# Patient Record
Sex: Female | Born: 1935 | Race: White | Hispanic: No | Smoking: Former smoker
Health system: Southern US, Community
[De-identification: ages and names within clinical notes are randomized; demographics above are authoritative.]

## PROBLEM LIST (undated history)

## (undated) DIAGNOSIS — H269 Unspecified cataract: Secondary | ICD-10-CM

## (undated) DIAGNOSIS — J432 Centrilobular emphysema: Secondary | ICD-10-CM

## (undated) DIAGNOSIS — K52831 Collagenous colitis: Secondary | ICD-10-CM

## (undated) DIAGNOSIS — I739 Peripheral vascular disease, unspecified: Secondary | ICD-10-CM

## (undated) DIAGNOSIS — F419 Anxiety disorder, unspecified: Secondary | ICD-10-CM

## (undated) DIAGNOSIS — I519 Heart disease, unspecified: Secondary | ICD-10-CM

## (undated) DIAGNOSIS — K449 Diaphragmatic hernia without obstruction or gangrene: Secondary | ICD-10-CM

## (undated) DIAGNOSIS — K5090 Crohn's disease, unspecified, without complications: Secondary | ICD-10-CM

## (undated) DIAGNOSIS — I480 Paroxysmal atrial fibrillation: Secondary | ICD-10-CM

## (undated) DIAGNOSIS — J189 Pneumonia, unspecified organism: Secondary | ICD-10-CM

## (undated) DIAGNOSIS — Z5189 Encounter for other specified aftercare: Secondary | ICD-10-CM

## (undated) DIAGNOSIS — F329 Major depressive disorder, single episode, unspecified: Secondary | ICD-10-CM

## (undated) DIAGNOSIS — M810 Age-related osteoporosis without current pathological fracture: Secondary | ICD-10-CM

## (undated) DIAGNOSIS — I272 Pulmonary hypertension, unspecified: Secondary | ICD-10-CM

## (undated) DIAGNOSIS — D649 Anemia, unspecified: Secondary | ICD-10-CM

## (undated) DIAGNOSIS — F32A Depression, unspecified: Secondary | ICD-10-CM

## (undated) DIAGNOSIS — R011 Cardiac murmur, unspecified: Secondary | ICD-10-CM

## (undated) DIAGNOSIS — M1990 Unspecified osteoarthritis, unspecified site: Secondary | ICD-10-CM

## (undated) DIAGNOSIS — E785 Hyperlipidemia, unspecified: Secondary | ICD-10-CM

## (undated) DIAGNOSIS — K315 Obstruction of duodenum: Secondary | ICD-10-CM

## (undated) DIAGNOSIS — K219 Gastro-esophageal reflux disease without esophagitis: Secondary | ICD-10-CM

## (undated) DIAGNOSIS — K269 Duodenal ulcer, unspecified as acute or chronic, without hemorrhage or perforation: Secondary | ICD-10-CM

## (undated) DIAGNOSIS — I10 Essential (primary) hypertension: Secondary | ICD-10-CM

## (undated) HISTORY — DX: Unspecified cataract: H26.9

## (undated) HISTORY — DX: Pneumonia, unspecified organism: J18.9

## (undated) HISTORY — DX: Anxiety disorder, unspecified: F41.9

## (undated) HISTORY — DX: Peripheral vascular disease, unspecified: I73.9

## (undated) HISTORY — DX: Hyperlipidemia, unspecified: E78.5

## (undated) HISTORY — PX: HIP SURGERY: SHX245

## (undated) HISTORY — PX: TUBAL LIGATION: SHX77

## (undated) HISTORY — DX: Essential (primary) hypertension: I10

## (undated) HISTORY — DX: Collagenous colitis: K52.831

## (undated) HISTORY — DX: Diaphragmatic hernia without obstruction or gangrene: K44.9

## (undated) HISTORY — DX: Major depressive disorder, single episode, unspecified: F32.9

## (undated) HISTORY — PX: CERVICAL DISC SURGERY: SHX588

## (undated) HISTORY — PX: COLONOSCOPY: SHX174

## (undated) HISTORY — DX: Unspecified osteoarthritis, unspecified site: M19.90

## (undated) HISTORY — DX: Obstruction of duodenum: K31.5

## (undated) HISTORY — DX: Crohn's disease, unspecified, without complications: K50.90

## (undated) HISTORY — DX: Encounter for other specified aftercare: Z51.89

## (undated) HISTORY — DX: Gastro-esophageal reflux disease without esophagitis: K21.9

## (undated) HISTORY — PX: CORONARY ARTERY BYPASS GRAFT: SHX141

## (undated) HISTORY — DX: Heart disease, unspecified: I51.9

## (undated) HISTORY — DX: Cardiac murmur, unspecified: R01.1

## (undated) HISTORY — DX: Age-related osteoporosis without current pathological fracture: M81.0

## (undated) HISTORY — DX: Anemia, unspecified: D64.9

## (undated) HISTORY — DX: Duodenal ulcer, unspecified as acute or chronic, without hemorrhage or perforation: K26.9

## (undated) HISTORY — PX: ABDOMINAL HYSTERECTOMY: SHX81

## (undated) HISTORY — DX: Depression, unspecified: F32.A

---

## 1975-06-19 HISTORY — PX: BUNIONECTOMY: SHX129

## 1996-06-18 HISTORY — PX: FRACTURE SURGERY: SHX138

## 1997-04-26 ENCOUNTER — Encounter: Payer: Self-pay | Admitting: Internal Medicine

## 1997-12-29 ENCOUNTER — Ambulatory Visit (HOSPITAL_COMMUNITY): Admission: RE | Admit: 1997-12-29 | Discharge: 1997-12-29 | Payer: Self-pay | Admitting: Gastroenterology

## 1998-05-18 HISTORY — PX: CAROTID ENDARTERECTOMY: SUR193

## 1998-05-26 ENCOUNTER — Encounter: Payer: Self-pay | Admitting: *Deleted

## 1998-05-26 ENCOUNTER — Ambulatory Visit (HOSPITAL_COMMUNITY): Admission: RE | Admit: 1998-05-26 | Discharge: 1998-05-26 | Payer: Self-pay | Admitting: *Deleted

## 1998-05-27 ENCOUNTER — Encounter: Payer: Self-pay | Admitting: *Deleted

## 1998-05-30 ENCOUNTER — Inpatient Hospital Stay: Admission: RE | Admit: 1998-05-30 | Discharge: 1998-05-31 | Payer: Self-pay | Admitting: *Deleted

## 1998-07-07 ENCOUNTER — Ambulatory Visit (HOSPITAL_COMMUNITY): Admission: RE | Admit: 1998-07-07 | Discharge: 1998-07-07 | Payer: Self-pay | Admitting: Family Medicine

## 1998-07-07 ENCOUNTER — Encounter: Payer: Self-pay | Admitting: Family Medicine

## 1998-11-01 ENCOUNTER — Other Ambulatory Visit: Admission: RE | Admit: 1998-11-01 | Discharge: 1998-11-01 | Payer: Self-pay | Admitting: Family Medicine

## 1999-07-04 ENCOUNTER — Encounter: Payer: Self-pay | Admitting: Family Medicine

## 1999-07-04 ENCOUNTER — Encounter: Admission: RE | Admit: 1999-07-04 | Discharge: 1999-07-04 | Payer: Self-pay | Admitting: Family Medicine

## 1999-12-05 ENCOUNTER — Other Ambulatory Visit: Admission: RE | Admit: 1999-12-05 | Discharge: 1999-12-05 | Payer: Self-pay | Admitting: Family Medicine

## 1999-12-25 ENCOUNTER — Encounter: Admission: RE | Admit: 1999-12-25 | Discharge: 1999-12-25 | Payer: Self-pay | Admitting: Family Medicine

## 1999-12-25 ENCOUNTER — Encounter: Payer: Self-pay | Admitting: Family Medicine

## 2000-07-05 ENCOUNTER — Encounter: Admission: RE | Admit: 2000-07-05 | Discharge: 2000-07-05 | Payer: Self-pay | Admitting: Family Medicine

## 2000-07-05 ENCOUNTER — Encounter: Payer: Self-pay | Admitting: Family Medicine

## 2000-09-25 ENCOUNTER — Ambulatory Visit (HOSPITAL_COMMUNITY): Admission: RE | Admit: 2000-09-25 | Discharge: 2000-09-25 | Payer: Self-pay | Admitting: Gastroenterology

## 2000-11-22 ENCOUNTER — Encounter: Payer: Self-pay | Admitting: Family Medicine

## 2000-11-22 ENCOUNTER — Encounter: Admission: RE | Admit: 2000-11-22 | Discharge: 2000-11-22 | Payer: Self-pay | Admitting: Family Medicine

## 2000-12-13 ENCOUNTER — Other Ambulatory Visit: Admission: RE | Admit: 2000-12-13 | Discharge: 2000-12-13 | Payer: Self-pay | Admitting: Family Medicine

## 2001-06-30 ENCOUNTER — Encounter: Payer: Self-pay | Admitting: Family Medicine

## 2001-06-30 ENCOUNTER — Encounter: Admission: RE | Admit: 2001-06-30 | Discharge: 2001-06-30 | Payer: Self-pay | Admitting: Family Medicine

## 2001-08-08 ENCOUNTER — Encounter: Payer: Self-pay | Admitting: Neurosurgery

## 2001-08-12 ENCOUNTER — Inpatient Hospital Stay (HOSPITAL_COMMUNITY): Admission: RE | Admit: 2001-08-12 | Discharge: 2001-08-13 | Payer: Self-pay | Admitting: Neurosurgery

## 2001-08-12 ENCOUNTER — Encounter: Payer: Self-pay | Admitting: Neurosurgery

## 2001-12-18 ENCOUNTER — Other Ambulatory Visit: Admission: RE | Admit: 2001-12-18 | Discharge: 2001-12-18 | Payer: Self-pay | Admitting: Family Medicine

## 2002-01-02 ENCOUNTER — Encounter: Payer: Self-pay | Admitting: Family Medicine

## 2002-01-02 ENCOUNTER — Encounter: Admission: RE | Admit: 2002-01-02 | Discharge: 2002-01-02 | Payer: Self-pay | Admitting: Family Medicine

## 2002-07-28 ENCOUNTER — Encounter: Admission: RE | Admit: 2002-07-28 | Discharge: 2002-07-28 | Payer: Self-pay | Admitting: Family Medicine

## 2002-07-28 ENCOUNTER — Encounter: Payer: Self-pay | Admitting: Family Medicine

## 2002-08-04 ENCOUNTER — Encounter: Admission: RE | Admit: 2002-08-04 | Discharge: 2002-08-04 | Payer: Self-pay | Admitting: Specialist

## 2002-08-04 ENCOUNTER — Encounter: Payer: Self-pay | Admitting: Specialist

## 2002-12-22 ENCOUNTER — Other Ambulatory Visit: Admission: RE | Admit: 2002-12-22 | Discharge: 2002-12-22 | Payer: Self-pay | Admitting: Family Medicine

## 2003-04-06 ENCOUNTER — Ambulatory Visit (HOSPITAL_COMMUNITY): Admission: RE | Admit: 2003-04-06 | Discharge: 2003-04-06 | Payer: Self-pay | Admitting: Gastroenterology

## 2003-05-18 ENCOUNTER — Ambulatory Visit (HOSPITAL_COMMUNITY): Admission: RE | Admit: 2003-05-18 | Discharge: 2003-05-18 | Payer: Self-pay | Admitting: *Deleted

## 2003-06-08 ENCOUNTER — Inpatient Hospital Stay (HOSPITAL_BASED_OUTPATIENT_CLINIC_OR_DEPARTMENT_OTHER): Admission: RE | Admit: 2003-06-08 | Discharge: 2003-06-08 | Payer: Self-pay | Admitting: Cardiovascular Disease

## 2003-06-19 HISTORY — PX: OTHER SURGICAL HISTORY: SHX169

## 2003-07-15 ENCOUNTER — Inpatient Hospital Stay (HOSPITAL_COMMUNITY): Admission: RE | Admit: 2003-07-15 | Discharge: 2003-07-19 | Payer: Self-pay | Admitting: Surgery

## 2003-08-30 ENCOUNTER — Encounter (HOSPITAL_COMMUNITY): Admission: RE | Admit: 2003-08-30 | Discharge: 2003-11-28 | Payer: Self-pay | Admitting: Cardiology

## 2003-11-29 ENCOUNTER — Encounter (HOSPITAL_COMMUNITY): Admission: RE | Admit: 2003-11-29 | Discharge: 2004-02-27 | Payer: Self-pay | Admitting: Cardiology

## 2003-12-20 ENCOUNTER — Emergency Department (HOSPITAL_COMMUNITY): Admission: EM | Admit: 2003-12-20 | Discharge: 2003-12-20 | Payer: Self-pay | Admitting: Emergency Medicine

## 2004-03-08 ENCOUNTER — Ambulatory Visit (HOSPITAL_COMMUNITY): Admission: RE | Admit: 2004-03-08 | Discharge: 2004-03-08 | Payer: Self-pay | Admitting: Family Medicine

## 2004-03-13 ENCOUNTER — Encounter: Admission: RE | Admit: 2004-03-13 | Discharge: 2004-03-29 | Payer: Self-pay | Admitting: Family Medicine

## 2005-01-10 ENCOUNTER — Ambulatory Visit: Payer: Self-pay | Admitting: Gastroenterology

## 2005-03-05 ENCOUNTER — Ambulatory Visit: Payer: Self-pay | Admitting: Cardiology

## 2005-07-13 ENCOUNTER — Ambulatory Visit (HOSPITAL_COMMUNITY): Admission: RE | Admit: 2005-07-13 | Discharge: 2005-07-13 | Payer: Self-pay | Admitting: Family Medicine

## 2006-11-28 ENCOUNTER — Ambulatory Visit: Payer: Self-pay | Admitting: Gastroenterology

## 2006-11-28 LAB — CONVERTED CEMR LAB
AST: 21 units/L (ref 0–37)
Alkaline Phosphatase: 84 units/L (ref 39–117)
BUN: 16 mg/dL (ref 6–23)
Basophils Absolute: 0 10*3/uL (ref 0.0–0.1)
Basophils Relative: 0.7 % (ref 0.0–1.0)
Chloride: 108 meq/L (ref 96–112)
Creatinine, Ser: 0.9 mg/dL (ref 0.4–1.2)
Eosinophils Absolute: 0.3 10*3/uL (ref 0.0–0.6)
Folate: 11.7 ng/mL
Hemoglobin: 11.9 g/dL — ABNORMAL LOW (ref 12.0–15.0)
Iron: 104 ug/dL (ref 42–145)
MCHC: 35.2 g/dL (ref 30.0–36.0)
Neutro Abs: 4.7 10*3/uL (ref 1.4–7.7)
Neutrophils Relative %: 67.2 % (ref 43.0–77.0)
Platelets: 272 10*3/uL (ref 150–400)
Potassium: 4.6 meq/L (ref 3.5–5.1)
Saturation Ratios: 28.8 % (ref 20.0–50.0)
Sodium: 143 meq/L (ref 135–145)
TSH: 0.86 microintl units/mL (ref 0.35–5.50)
WBC: 6.9 10*3/uL (ref 4.5–10.5)

## 2006-12-02 ENCOUNTER — Ambulatory Visit: Payer: Self-pay | Admitting: Gastroenterology

## 2007-05-17 ENCOUNTER — Encounter: Payer: Self-pay | Admitting: Internal Medicine

## 2007-05-17 DIAGNOSIS — J189 Pneumonia, unspecified organism: Secondary | ICD-10-CM

## 2007-06-25 ENCOUNTER — Ambulatory Visit (HOSPITAL_COMMUNITY): Admission: RE | Admit: 2007-06-25 | Discharge: 2007-06-25 | Payer: Self-pay | Admitting: Family Medicine

## 2007-07-18 ENCOUNTER — Ambulatory Visit: Payer: Self-pay | Admitting: Internal Medicine

## 2007-07-18 DIAGNOSIS — E785 Hyperlipidemia, unspecified: Secondary | ICD-10-CM

## 2007-07-18 DIAGNOSIS — K219 Gastro-esophageal reflux disease without esophagitis: Secondary | ICD-10-CM

## 2007-07-18 DIAGNOSIS — I2510 Atherosclerotic heart disease of native coronary artery without angina pectoris: Secondary | ICD-10-CM

## 2007-07-18 DIAGNOSIS — J984 Other disorders of lung: Secondary | ICD-10-CM | POA: Insufficient documentation

## 2007-07-18 DIAGNOSIS — I499 Cardiac arrhythmia, unspecified: Secondary | ICD-10-CM | POA: Insufficient documentation

## 2007-07-18 DIAGNOSIS — J40 Bronchitis, not specified as acute or chronic: Secondary | ICD-10-CM | POA: Insufficient documentation

## 2007-07-18 DIAGNOSIS — I10 Essential (primary) hypertension: Secondary | ICD-10-CM

## 2007-07-18 DIAGNOSIS — K5090 Crohn's disease, unspecified, without complications: Secondary | ICD-10-CM | POA: Insufficient documentation

## 2007-07-18 DIAGNOSIS — Z9889 Other specified postprocedural states: Secondary | ICD-10-CM

## 2007-08-08 ENCOUNTER — Ambulatory Visit: Payer: Self-pay | Admitting: Internal Medicine

## 2007-10-03 ENCOUNTER — Inpatient Hospital Stay (HOSPITAL_COMMUNITY): Admission: EM | Admit: 2007-10-03 | Discharge: 2007-10-05 | Payer: Self-pay | Admitting: Emergency Medicine

## 2007-10-31 DIAGNOSIS — K315 Obstruction of duodenum: Secondary | ICD-10-CM | POA: Insufficient documentation

## 2007-10-31 DIAGNOSIS — K449 Diaphragmatic hernia without obstruction or gangrene: Secondary | ICD-10-CM | POA: Insufficient documentation

## 2007-10-31 DIAGNOSIS — I739 Peripheral vascular disease, unspecified: Secondary | ICD-10-CM | POA: Insufficient documentation

## 2007-12-23 ENCOUNTER — Encounter: Admission: RE | Admit: 2007-12-23 | Discharge: 2007-12-23 | Payer: Self-pay | Admitting: Family Medicine

## 2008-10-06 ENCOUNTER — Ambulatory Visit (HOSPITAL_COMMUNITY): Admission: RE | Admit: 2008-10-06 | Discharge: 2008-10-06 | Payer: Self-pay | Admitting: Family Medicine

## 2008-10-10 ENCOUNTER — Inpatient Hospital Stay (HOSPITAL_COMMUNITY): Admission: EM | Admit: 2008-10-10 | Discharge: 2008-10-13 | Payer: Self-pay | Admitting: Emergency Medicine

## 2008-10-12 ENCOUNTER — Ambulatory Visit: Payer: Self-pay | Admitting: Vascular Surgery

## 2008-10-12 ENCOUNTER — Encounter (INDEPENDENT_AMBULATORY_CARE_PROVIDER_SITE_OTHER): Payer: Self-pay | Admitting: Orthopedic Surgery

## 2008-12-02 ENCOUNTER — Ambulatory Visit: Payer: Self-pay | Admitting: *Deleted

## 2009-06-03 ENCOUNTER — Ambulatory Visit: Payer: Self-pay | Admitting: Vascular Surgery

## 2009-06-28 ENCOUNTER — Ambulatory Visit: Payer: Self-pay | Admitting: Gastroenterology

## 2009-06-28 DIAGNOSIS — R197 Diarrhea, unspecified: Secondary | ICD-10-CM | POA: Insufficient documentation

## 2009-06-28 LAB — CONVERTED CEMR LAB
ALT: 17 units/L (ref 0–35)
Basophils Absolute: 0 10*3/uL (ref 0.0–0.1)
Basophils Relative: 0.5 % (ref 0.0–3.0)
Bilirubin, Direct: 0.1 mg/dL (ref 0.0–0.3)
CO2: 28 meq/L (ref 19–32)
Calcium: 9.4 mg/dL (ref 8.4–10.5)
Chloride: 100 meq/L (ref 96–112)
Ferritin: 86.4 ng/mL (ref 10.0–291.0)
GFR calc non Af Amer: 51.89 mL/min (ref >60)
Glucose, Bld: 74 mg/dL (ref 70–99)
HCT: 35.8 % — ABNORMAL LOW (ref 36.0–46.0)
Hemoglobin: 12 g/dL (ref 12.0–15.0)
Lymphocytes Relative: 19.8 % (ref 12.0–46.0)
Lymphs Abs: 1.5 10*3/uL (ref 0.7–4.0)
Monocytes Relative: 4.9 % (ref 3.0–12.0)
Neutrophils Relative %: 70.7 % (ref 43.0–77.0)
Platelets: 236 10*3/uL (ref 150.0–400.0)
Potassium: 3.8 meq/L (ref 3.5–5.1)
RBC: 3.5 M/uL — ABNORMAL LOW (ref 3.87–5.11)
Saturation Ratios: 25.6 % (ref 20.0–50.0)
Total Bilirubin: 0.5 mg/dL (ref 0.3–1.2)
Total Protein: 6.3 g/dL (ref 6.0–8.3)
Vitamin B-12: 447 pg/mL (ref 211–911)

## 2009-06-29 ENCOUNTER — Ambulatory Visit: Payer: Self-pay | Admitting: Gastroenterology

## 2009-06-29 DIAGNOSIS — K2970 Gastritis, unspecified, without bleeding: Secondary | ICD-10-CM | POA: Insufficient documentation

## 2009-06-29 DIAGNOSIS — K2990 Gastroduodenitis, unspecified, without bleeding: Secondary | ICD-10-CM

## 2009-06-29 LAB — CONVERTED CEMR LAB: UREASE: NEGATIVE

## 2009-07-04 ENCOUNTER — Encounter: Payer: Self-pay | Admitting: Gastroenterology

## 2009-07-05 ENCOUNTER — Telehealth: Payer: Self-pay | Admitting: Gastroenterology

## 2009-07-06 ENCOUNTER — Telehealth: Payer: Self-pay | Admitting: Gastroenterology

## 2009-07-07 ENCOUNTER — Telehealth: Payer: Self-pay | Admitting: Gastroenterology

## 2009-08-09 ENCOUNTER — Ambulatory Visit: Payer: Self-pay | Admitting: Gastroenterology

## 2009-08-09 DIAGNOSIS — K5180 Other ulcerative colitis without complications: Secondary | ICD-10-CM

## 2009-08-16 ENCOUNTER — Emergency Department (HOSPITAL_COMMUNITY): Admission: EM | Admit: 2009-08-16 | Discharge: 2009-08-16 | Payer: Self-pay | Admitting: Emergency Medicine

## 2010-02-13 ENCOUNTER — Ambulatory Visit (HOSPITAL_COMMUNITY): Admission: RE | Admit: 2010-02-13 | Discharge: 2010-02-13 | Payer: Self-pay | Admitting: Family Medicine

## 2010-06-18 ENCOUNTER — Inpatient Hospital Stay (HOSPITAL_COMMUNITY)
Admission: EM | Admit: 2010-06-18 | Discharge: 2010-06-22 | Payer: Self-pay | Source: Home / Self Care | Attending: Internal Medicine | Admitting: Internal Medicine

## 2010-06-21 LAB — CBC
HCT: 30.9 % — ABNORMAL LOW (ref 36.0–46.0)
Hemoglobin: 10.1 g/dL — ABNORMAL LOW (ref 12.0–15.0)
MCH: 32.3 pg (ref 26.0–34.0)
MCHC: 32.7 g/dL (ref 30.0–36.0)
MCV: 98.7 fL (ref 78.0–100.0)
Platelets: 268 10*3/uL (ref 150–400)
RBC: 3.13 MIL/uL — ABNORMAL LOW (ref 3.87–5.11)
RDW: 12.9 % (ref 11.5–15.5)
WBC: 14.4 10*3/uL — ABNORMAL HIGH (ref 4.0–10.5)

## 2010-06-22 LAB — DIFFERENTIAL
Basophils Absolute: 0 10*3/uL (ref 0.0–0.1)
Basophils Relative: 0 % (ref 0–1)
Eosinophils Absolute: 0.3 10*3/uL (ref 0.0–0.7)
Eosinophils Relative: 2 % (ref 0–5)
Lymphocytes Relative: 11 % — ABNORMAL LOW (ref 12–46)
Lymphs Abs: 1.5 10*3/uL (ref 0.7–4.0)
Monocytes Absolute: 0.8 10*3/uL (ref 0.1–1.0)
Monocytes Relative: 6 % (ref 3–12)
Neutro Abs: 11.8 10*3/uL — ABNORMAL HIGH (ref 1.7–7.7)
Neutrophils Relative %: 82 % — ABNORMAL HIGH (ref 43–77)

## 2010-06-22 LAB — CBC
HCT: 29.9 % — ABNORMAL LOW (ref 36.0–46.0)
Hemoglobin: 9.7 g/dL — ABNORMAL LOW (ref 12.0–15.0)
MCH: 31.8 pg (ref 26.0–34.0)
MCHC: 32.4 g/dL (ref 30.0–36.0)
MCV: 98 fL (ref 78.0–100.0)
Platelets: 276 10*3/uL (ref 150–400)
RBC: 3.05 MIL/uL — ABNORMAL LOW (ref 3.87–5.11)
RDW: 12.9 % (ref 11.5–15.5)
WBC: 10.6 10*3/uL — ABNORMAL HIGH (ref 4.0–10.5)

## 2010-07-09 ENCOUNTER — Encounter: Payer: Self-pay | Admitting: Family Medicine

## 2010-07-18 NOTE — Miscellaneous (Signed)
Summary: entocort order  Clinical Lists Changes  Medications: Added new medication of ENTOCORT EC 3 MG XR24H-CAP (BUDESONIDE) Take 3 capsules p.o. every morning - Signed Rx of ENTOCORT EC 3 MG XR24H-CAP (BUDESONIDE) Take 3 capsules p.o. every morning;  #90 x 11;  Signed;  Entered by: Abel Presto RN;  Authorized by: Sable Feil MD [facility];  Method used: Electronically to [facility] [id number] [place], [place], [place], [place]  [address], Ph: [phone], Fax: [phone]    Prescriptions: ENTOCORT EC 3 MG XR24H-CAP (BUDESONIDE) Take 3 capsules p.o. every morning  #90 x 11   Entered by:   Abel Presto RN   Authorized by:   Sable Feil MD [facility]   Signed by:   Abel Presto RN on 07/04/2009   Method used:   Electronically to        [facility] [phone]* (retail)       [address] [place], [place]  [address]       Ph: [phone]       Fax: [phone]   RxID:   [id number]

## 2010-07-18 NOTE — Progress Notes (Signed)
Summary: Entocort too expensive   Phone Note Call from Patient Call back at Home Phone [phone]   Call For: DR PATTERSON Reason for Call: Talk to Nurse Summary of Call: Entocort is going to cost her 1000-Can she get something less costly. Initial call taken by: Irwin Brakeman [facility],  July 05, 2009 11:37 AM  Follow-up for Phone Call        she really needs to get this medication and have him on after using it for one or 2 months. There really is no good alternative successful treatment here Follow-up by: Sable Feil MD [facility],  July 05, 2009 12:30 PM  Additional Follow-up for Phone Call Additional follow up Details #1::        Pt notified.  Pt req Rx be sent to [facility] on [address].  Return appt scheduled. Additional Follow-up by: Alberteen Spindle RN,  July 06, 2009 9:46 AM    Prescriptions: ENTOCORT EC 3 MG XR24H-CAP (BUDESONIDE) Take 3 capsules p.o. every morning  #90 x 3   Entered by:   Alberteen Spindle RN   Authorized by:   Sable Feil MD [facility]   Signed by:   Alberteen Spindle RN on 07/06/2009   Method used:   Electronically to        [facility] [address] [phone]* (retail)       [address] [place], [place]  [address]       Ph: [phone]       Fax: [phone]   RxID:   [id number]

## 2010-07-18 NOTE — Progress Notes (Signed)
Summary: change pharmacies   Phone Note Call from Patient Call back at Home Phone [phone]   Caller: Patient Call For: Dr. Sharlett Iles Reason for Call: Talk to Nurse Summary of Call: pt says her Entocort was called into wrong pharmacy... correct pharmacy is [facility] on [address] at [facility] Initial call taken by: Lucien Mons,  July 06, 2009 1:53 PM  Follow-up for Phone Call        Rx resent. Follow-up by: Alberteen Spindle RN,  July 06, 2009 2:21 PM    Prescriptions: ENTOCORT EC 3 MG XR24H-CAP (BUDESONIDE) Take 3 capsules p.o. every morning  #90 x 3   Entered by:   Alberteen Spindle RN   Authorized by:   Sable Feil MD [facility]   Signed by:   Alberteen Spindle RN on 07/06/2009   Method used:   Electronically to        [facility] (retail)       [phone]       [place], [place]  [address]       Ph: [phone]       Fax: [phone]   RxID:   [id number]

## 2010-07-18 NOTE — Miscellaneous (Signed)
Summary: clotest  Clinical Lists Changes  Problems: Added new problem of GASTRITIS (ICD-535.50) Orders: Added new Test order of TLB-H Pylori Screen Gastric Biopsy (83013-CLOTEST) - Signed

## 2010-07-18 NOTE — Procedures (Signed)
Summary: Flexible Sigmoidoscopy  Patient: [name] Note: All result statuses are Final unless otherwise noted.  Tests: (1) Flexible Sigmoidoscopy (FLX)  FLX Flexible Sigmoidoscopy                             DONE     [facility]     [place], [place]  [address]           FLEXIBLE SIGMOIDOSCOPY PROCEDURE REPORT           PATIENT:  Cristina Parker, Cristina Parker  MR#:  [id number]     BIRTHDATE:  08/14/1935, 55 yrs. old  GENDER:  female           ENDOSCOPIST:  Loralee Pacas. Sharlett Iles, MD, [facility]     Referred by:           PROCEDURE DATE:  06/29/2009     PROCEDURE:  Flexible Sigmoidoscopy with biopsy     ASA CLASS:  Class III     INDICATIONS:  unexplained diarrhea           MEDICATIONS:   Fentanyl 50 mcg IV, Versed 6 mg IV           DESCRIPTION OF PROCEDURE:   After the risks benefits and     alternatives of the procedure were thoroughly explained, informed     consent was obtained.  Digital rectal exam was performed and     revealed no abnormalities.   The [id number]-CF-H180AL [id number] endoscope     was introduced through the anus and advanced to the descending     colon, limited by poor preparation.   The quality of the prep was     poor.  The instrument was then slowly withdrawn as the mucosa was     fully examined.     <<PROCEDUREIMAGES>>           The area of colon examined was normal in appearance. RANDOM     BIOPSIES DONE.   Retroflexed views in the rectum revealed not     performed.    The scope was then withdrawn from the patient and     the procedure terminated.           COMPLICATIONS:  None           ENDOSCOPIC IMPRESSION:     1) Normal colon     2) Not performed     R/O MICROSCOPIC/COLLAGENOUS COLITIS.     RECOMMENDATIONS:     1) await biopsy results     EGD PER HX. OF RECURRENT PEPTIC ULCER DISEASE AND OUTLET     OBSTRUCTION.           REPEAT EXAM:  No           ______________________________     Loralee Pacas. Sharlett Iles, MD, [facility]           CC:  Ihor Gully, MD           n.     Lorrin Mais:   Loralee Pacas. Patterson at 06/29/2009 03:50 PM           [facility], [id number]  Note: An exclamation mark (!) indicates a result that was not dispersed into the flowsheet. Document Creation Date: 06/29/2009 3:51 PM _______________________________________________________________________  (1) Order result status: Final Collection or observation date-time: 06/29/2009 15:45 Requested date-time:  Receipt date-time:  Reported date-time:  Referring Physician:   Ordering  Physician: Verl Blalock [phone]) Specimen Source:  Source: Tawanna Cooler Order Number: [id number] Lab site:

## 2010-07-18 NOTE — Procedures (Signed)
Summary: Upper Endoscopy  Patient: [name] Note: All result statuses are Final unless otherwise noted.  Tests: (1) Upper Endoscopy (EGD)   EGD Upper Endoscopy       [facility]     [place], [place]  [address]           ENDOSCOPY PROCEDURE REPORT           PATIENT:  Cristina Parker, Cristina Parker  MR#:  [id number]     BIRTHDATE:  May 15, 1936, 74 yrs. old  GENDER:  female           ENDOSCOPIST:  Loralee Pacas. Sharlett Iles, MD, [facility]     Referred by:           PROCEDURE DATE:  06/29/2009     PROCEDURE:  EGD with biopsy     ASA CLASS:  Class III     INDICATIONS:  abdominal pain, diarrhea HX. OF RECURRENT OUTLET     OBSTRUCTION.           MEDICATIONS:   There was residual sedation effect present from     prior procedure., Versed 2 mg IV, glycopyrrolate (Robinal) 0.2 mg     IV     TOPICAL ANESTHETIC:  Exactacain Spray           DESCRIPTION OF PROCEDURE:   After the risks benefits and     alternatives of the procedure were thoroughly explained, informed     consent was obtained.  The LB GIF-H180 [id number] endoscope was     introduced through the mouth and advanced to the first portion of     the duodenum, limited by a stricture. PYLORIC AND DUODENAL     STRICTURES.NO ACTIVE ULCER.NO RETAINED FOOD.NOT CRITICAL STENOSIS     AND NO DILATION DONE.  The instrument was slowly withdrawn as the     mucosa was fully examined.     <<PROCEDUREIMAGES>>           Mild gastritis was found in the antrum. CLO BX. DONE.  The     esophagus and gastroesophageal junction were completely normal in     appearance.  A stricture was found in the bulb of the duodenum.     Retroflexed views revealed no abnormalities.    The scope was then     withdrawn from the patient and the procedure completed.           COMPLICATIONS:  None           ENDOSCOPIC IMPRESSION:     1) Mild gastritis in the antrum     2) Normal esophagus     3) Stricture in the bulb of duodenum     NO ACTIVE  ULCER.CHRONIC DUODENAL STENOSIS.NOT DILATED PER PLAVIX     AND ASA RX.     RECOMMENDATIONS:     1) continue PPI     2) Rx CLO if positive           REPEAT EXAM:  No           ______________________________     Loralee Pacas. Sharlett Iles, MD, [facility]           CC:  Ihor Gully, MD           n.     Lorrin Mais:   Loralee Pacas. Patrick Sohm at 06/29/2009 04:04 PM           [facility], [id number]  Note: An exclamation  mark (!) indicates a result that was not dispersed into the flowsheet. Document Creation Date: 06/29/2009 4:04 PM _______________________________________________________________________  (1) Order result status: Final Collection or observation date-time: 06/29/2009 15:57 Requested date-time:  Receipt date-time:  Reported date-time:  Referring Physician:   Ordering Physician: Verl Blalock [phone]) Specimen Source:  Source: Tawanna Cooler Order Number: [id number] Lab site:

## 2010-07-18 NOTE — Letter (Signed)
Summary: Patient Notice- Colon Biospy Results  [facility]  [address] [place], [place] [address]   Phone: [phone]  Fax: [phone]        July 04, 2009 MRN: [id number]    [facility] [address] [place], [place]  [address]    Dear Ms. Royals,  I am pleased to inform you that the biopsies taken during your recent colonoscopy did not show any evidence of cancer upon pathologic examination.  Additional information/recommendations:  __No further action is needed at this time.  Please follow-up with      your primary care physician for your other healthcare needs.  xx__Please call [phone] to schedule a return visit to review      your condition.  __Continue with the treatment plan as outlined on the day of your      exam.  __You should have a repeat colonoscopy examination for this problem           in _ years.  Please call us if you are having persistent problems or have questions about your condition that have not been fully answered at this time.  Sincerely,  Sable Feil MD [facility]   This letter has been electronically signed by your physician.  Appended Document: Patient Notice- Colon Biospy Results Letter mailed 1.18.11

## 2010-07-18 NOTE — Assessment & Plan Note (Signed)
Summary: follow up flex/lk    History of Present Illness Visit Type: follow up  Primary GI MD: Verl Blalock MD FACP [place] Primary Provider: Gay Filler. Copland, MD  Requesting Provider: n/a Chief Complaint: Follow up from endo, and flex sig. Pt states that she is better and denies any GI complaints  History of Present Illness:   Colonoscopy was normal but biopsy showed evidence of lymphocytic colitis. She is currently asymptomatic on Entocort 9 mg a day. She denies abdominal pain, diarrhea, or systemic complaints. She is back on her Plavix and aspirin.   GI Review of Systems      Denies abdominal pain, acid reflux, belching, bloating, chest pain, dysphagia with liquids, dysphagia with solids, heartburn, loss of appetite, nausea, vomiting, vomiting blood, weight loss, and  weight gain.        Denies anal fissure, black tarry stools, change in bowel habit, constipation, diarrhea, diverticulosis, fecal incontinence, heme positive stool, hemorrhoids, irritable bowel syndrome, jaundice, light color stool, liver problems, rectal bleeding, and  rectal pain.    Current Medications (verified): 1)  Plavix 75 Mg  Tabs (Clopidogrel Bisulfate) .Marland Kitchen.. 1 By Mouth Once Daily 2)  Adult Aspirin Ec Low Strength 81 Mg  Tbec (Aspirin) .Marland Kitchen.. 1 By Mouth Once Daily 3)  Prinzide 10-12.5 Mg  Tabs (Lisinopril-Hydrochlorothiazide) .Marland Kitchen.. 1 By Mouth Each Morning 4)  Pantoprazole Sodium 40 Mg Tbec (Pantoprazole Sodium) .... Take One By Mouth Once Daily 5)  Citracal + D 250-200 Mg-Unit  Tabs (Calcium Citrate-Vitamin D) .... 2 By Mouth Once Daily 6)  Toprol Xl 50 Mg  Tb24 (Metoprolol Succinate) .Marland Kitchen.. 1 By Mouth Once Daily 7)  Vitamin C 1000 Mg  Tabs (Ascorbic Acid) .Marland Kitchen.. 1 By Mouth Once Daily 8)  Lipitor 40 Mg  Tabs (Atorvastatin Calcium) .Marland Kitchen.. 1 By Mouth At Bedtime 9)  Lexapro 20 Mg  Tabs (Escitalopram Oxalate) .Marland Kitchen.. 1 By Mouth Once Daily 10)  Tylenol Pm Extra Strength 500-25 Mg  Tabs (Diphenhydramine-Apap (Sleep)) .... As  Needed 11)  Excedrin Tension Headache 500-65 Mg Tabs (Acetaminophen-Caffeine) .... Two Tablets By Mouth Once Daily 12)  Vicodin 5-500 Mg  Tabs (Hydrocodone-Acetaminophen) .... Take 1-3 Per Day As Needed 13)  Entocort Ec 3 Mg Xr24h-Cap (Budesonide) .... Take 3 Capsules P.o. Every Morning  Allergies (verified): No Known Drug Allergies  Past History:  Past medical, surgical, family and social histories (including risk factors) reviewed for relevance to current acute and chronic problems.  Past Medical History: Reviewed history from 08/08/2007 and no changes required. Coronary Heart Disease Crohn's Disease Duodenal stricture with dilatation G E R D Hypertension Pneumonia hosp 1998, outpt 2008 Lung nodule Hyperlipidemia  Past Surgical History: Reviewed history from 06/28/2009 and no changes required. Inominate artery aorta bypass graft and CABG for atherosclerosis 2005 Left carotid endarterectomy right hip repair 09-2008 Hysterectomy cervical disk surgery bilateral bunionectomy  Family History: Reviewed history from 06/28/2009 and no changes required. No FH of Colon Cancer: Family History of Colon Polyps: father Family History of Heart Disease: father  Social History: Reviewed history from 06/28/2009 and no changes required. Patient states former smoker.x 30 yrs Retired Therapist, sports Alcohol Use - yes 1-2 wine per day  Illicit Drug Use - no  Review of Systems       The patient complains of arthritis/joint pain and back pain.  The patient denies allergy/sinus, anemia, anxiety-new, blood in urine, breast changes/lumps, change in vision, confusion, cough, coughing up blood, depression-new, fainting, fatigue, fever, headaches-new, hearing problems, heart murmur, heart rhythm changes,  itching, menstrual pain, muscle pains/cramps, night sweats, nosebleeds, pregnancy symptoms, shortness of breath, skin rash, sleeping problems, sore throat, swelling of feet/legs, swollen lymph glands, thirst -  excessive , urination - excessive , urination changes/pain, urine leakage, vision changes, and voice change.    Vital Signs:  Patient profile:   75 year old female Height:      60 inches Weight:      123 pounds BMI:     24.11 BSA:     1.52 Pulse rate:   76 / minute Pulse rhythm:   regular BP sitting:   100 / 60  (left arm) Cuff size:   regular  Vitals Entered By: Hope Pigeon CMA (August 09, 2009 9:52 AM)  Physical Exam  General:  Well developed, well nourished, no acute distress.healthy appearing.   Head:  Normocephalic and atraumatic. Eyes:  PERRLA, no icterus. Abdomen:  Soft, nontender and nondistended. No masses, hepatosplenomegaly or hernias noted. Normal bowel sounds. Neurologic:  Alert and  oriented x4;  grossly normal neurologically. Psych:  Alert and cooperative. Normal mood and affect.   Impression & Recommendations:  Problem # 1:  OTHER ULCERATIVE COLITIS (ICD-556.8) Assessment Improved We will slowly taper her Entocort as tolerated. She may need chronic low-dose treatment to control her chronic diarrhea syndrome. On chart review she has had recurrent diarrhea for many years. She denies NSAID use at this time.  Problem # 2:  HYPERTENSION (ICD-401.9) Assessment: Improved blood pressure today is 100/60. She has been asked to use all of her other multiple medications as per her primary care physician Dr. Silvestre Mesi.  Patient Instructions: 1)  Please schedule a follow-up appointment as needed.  2)  Copy sent to : Dr. Silvestre Mesi 3)  Please continue current medications.  4)  The medication list was reviewed and reconciled.  All changed / newly prescribed medications were explained.  A complete medication list was provided to the patient / caregiver.

## 2010-07-18 NOTE — Assessment & Plan Note (Signed)
Summary: DIARRHEA--CH.    History of Present Illness Visit Type: Follow-up Visit Primary GI MD: Verl Blalock MD FACP [place] Primary Provider: [facility] [address] Chief Complaint: Patient has lower abdominal pain mostly when she needs to have a BM. She complains of diarrhea which she has 4-7 BM per day. She has not had a formed BM in 6-8 weeks. Patient states that this diarrhea is worse than when she was dxed with crohns. She denies any recent antibiotic use, none since April. She also denies any blood in her stool.  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  Patient had a hip fracture in April of this year and was hospitalized and had a hip internal repair by Dr. Emiliano Dyer. 2 weeks' postop she developed diarrhea and was treated with probiotic therapy and had good response. She now complains of diffuse abdominal cramping with 6-8 loose bowel movements a day without melena or hematochezia. She's had no anorexia, weight loss, or systemic complaints such as fever chills. She does have urgency and nocturnal awakening. She has been on protonix 40 mg a day for several years. Last endoscopy colonoscopy were 2 years ago at which time she did not have active colitis but was on amino salicylate therapy. She continues to be frequent small meals but denies nausea and vomiting. She does not abuse NSAIDs, alcohol, or cigarettes but is a previous smoker. She takes Vicodin several times a day for hip pain, p.r.n. Imodium, aspirin Plavix for coronary artery disease. She is followed by Dr. Ellouise Newer and cardiology.  She currently denies fever, chills, skin rashes, joint  pains, oral stomatitis, or any hepatobiliary problems. She does suffer from chronic hyperlipidemia.   GI Review of Systems    Reports abdominal pain.     Location of  Abdominal pain: lower abdomen.    Denies acid reflux, belching, bloating, chest pain, dysphagia with liquids, dysphagia with solids, heartburn, loss of appetite, nausea, vomiting, vomiting blood, weight loss, and  weight gain.      Reports diarrhea.     Denies anal fissure, black tarry stools, change in bowel habit, constipation, diverticulosis, fecal incontinence, heme positive stool, hemorrhoids, irritable bowel syndrome, jaundice, light color stool, liver problems, rectal bleeding, and  rectal pain. Preventive Screening-Counseling & Management      Drug Use:  no.      Current Medications (verified): 1)  Plavix 75 Mg  Tabs (Clopidogrel Bisulfate) .Marland Kitchen.. 1 By Mouth Once Daily 2)  Adult Aspirin Ec Low Strength 81 Mg  Tbec (Aspirin) .Marland Kitchen.. 1 By Mouth Once Daily 3)  Prinzide 10-12.5 Mg  Tabs (Lisinopril-Hydrochlorothiazide) .Marland Kitchen.. 1 By Mouth Each Morning 4)  Pantoprazole Sodium 40 Mg Tbec (Pantoprazole Sodium) .... Take One By Mouth Once Daily 5)  Xalatan 0.005 %  Soln (Latanoprost) .Marland Kitchen.. 1 Drop Each Eye At Bedtime 6)  Citracal + D 250-200 Mg-Unit  Tabs (Calcium Citrate-Vitamin D) .... 2 By Mouth Once Daily 7)  Toprol Xl 50 Mg  Tb24 (Metoprolol Succinate) .Marland Kitchen.. 1 By Mouth Once Daily 8)  Vitamin C 1000 Mg  Tabs (Ascorbic Acid) .Marland Kitchen.. 1 By Mouth Once Daily 9)  Lipitor 40 Mg  Tabs (Atorvastatin Calcium) .Marland Kitchen.. 1 By Mouth At Bedtime 10)  Lexapro  20 Mg  Tabs (Escitalopram Oxalate) .Marland Kitchen.. 1 By Mouth Once Daily 11)  Tylenol Pm Extra Strength 500-25 Mg  Tabs (Diphenhydramine-Apap (Sleep)) .... As Needed 12)  Excedrin Back & Body 250-250 Mg  Tabs (Acetaminophen-Aspirin Buffered) .... As Needed 13)  Imodium A-D 2 Mg  Tabs (Loperamide Hcl) .... Take 3-4 Per Day 14)  Vicodin 5-500 Mg  Tabs (Hydrocodone-Acetaminophen) .... Take 1-3 Per Day As  Needed  Allergies (verified): No Known Drug Allergies  Past History:  Past medical, surgical, family and social histories (including risk factors) reviewed for relevance to current acute and chronic problems.  Past Medical History: Reviewed history from 08/08/2007 and no changes required. Coronary Heart Disease Crohn's Disease Duodenal stricture with dilatation G E [facility] D Hypertension Pneumonia hosp 1998, outpt 2008 Lung nodule Hyperlipidemia  Past Surgical History: Inominate artery aorta bypass graft and CABG for atherosclerosis 2005 Left carotid endarterectomy right hip repair 09-2008 Hysterectomy cervical disk surgery bilateral bunionectomy  Family History: Reviewed history and no changes required. No FH of Colon Cancer: Family History of Colon Polyps: father Family History of Heart Disease: father  Social History: Reviewed history from 07/18/2007 and no changes required. Patient states former smoker.x 30 yrs Retired Therapist, sports Alcohol Use - yes 1-2 wine per day  Illicit Drug Use - no Drug Use:  no  Review of Systems       The patient complains of arthritis/joint pain, back pain, change in vision, cough, shortness of breath, and voice change.  The patient denies allergy/sinus, anemia, anxiety-new, blood in urine, breast changes/lumps, confusion, coughing up blood, depression-new, fainting, fatigue, fever, headaches-new, hearing problems, heart murmur, heart rhythm changes, itching, menstrual pain, muscle pains/cramps, night sweats, nosebleeds, pregnancy symptoms, skin rash, sleeping problems, sore throat, swelling of feet/legs, swollen lymph glands, thirst - excessive , urination - excessive , urination changes/pain, urine leakage, and vision changes.    Vital Signs:  Patient profile:   75 year old female Height:      60 inches Weight:      124.4 pounds BMI:     24.38 Pulse rate:   80 / minute Pulse rhythm:   regular BP sitting:   108 / 58  (left arm) Cuff size:    regular  Vitals Entered By: Bernita Buffy CMA Deborra Medina) (June 28, 2009 11:41 AM)  Physical Exam  General:  Well developed, well nourished, no acute distress.healthy appearing.   Head:  Normocephalic and atraumatic. Eyes:  PERRLA, no icterus.exam deferred to patient's ophthalmologist.   Neck:  Supple; no masses or thyromegaly. Lungs:  Clear throughout to auscultation. Heart:  Regular rate and rhythm; no murmurs, rubs,  or bruits. Abdomen:  Soft, nontender and nondistended. No masses, hepatosplenomegaly or hernias noted. Normal bowel sounds.There is epigastric and lower abdominal bruit noted. Rectal:  Normal exam.hemocult negative.  Stool is soft, pasty, and guaiac negative. Pulses:  peripheral pulses in her legs are diminished but present. Extremities:  No clubbing, cyanosis, edema or deformities noted. Neurologic:  Alert and  oriented x4;  grossly normal neurologically. Cervical Nodes:  No significant cervical adenopathy. Inguinal Nodes:  No significant inguinal adenopathy. Psych:  Alert and cooperative. Normal mood and affect.   Impression & Recommendations:  Problem # 1:  DIARRHEA (ICD-787.91) Assessment Deteriorated Her diarrhea does not seem consistent with C. difficile colitis. On reviewing her extensive records she has had rather severe Crohn's colitis and I suspect she is having a flare at this time. I set her up for flex sigmoid exam and screening  laboratory parameters and we'll proceed accordingly. She is on Plavix and aspirin which we will continue. Orders: TLB-BMP (Basic Metabolic Panel-BMET) (77414-ELTRVUY) TLB-CBC Platelet - w/Differential (85025-CBCD) TLB-Hepatic/Liver Function Pnl (80076-HEPATIC) TLB-TSH (Thyroid Stimulating Hormone) (84443-TSH) TLB-CRP-High Sensitivity (C-Reactive Protein) (86140-FCRP) TLB-Sedimentation Rate (ESR) (85652-ESR) T-Gastrin, RIA (23343-56861) EFL (Endo/Flex)  Problem # 2:  Hx of CROHN'S DISEASE (ICD-555.9) Assessment: Comment  Only  Problem # 3:  OTHER OBSTRUCTION OF DUODENUM (ICD-537.3) Assessment: Unchanged I suspect she has a recurrent peptic stricture of her duodenum which may be from chronic upper GI Crohn's disease.Previous IBD serologies were negative. Serum gastrin level has been ordered. Depending on her sigmoid exam I may repeat her endoscopy and consider repeat dilation if indicated. Biopsies to update her H. pylori status also would be indicated.  Problem # 4:  PVD (ICD-443.9) Assessment: Unchanged her symptoms do not seem consistent with ischemic bowel problems but this is a consideration in view of the severity of her coronary artery disease and peripheral vascular disease.  Problem # 5:  HYPERTENSION (ICD-401.9) Assessment: Improved blood pressure today is 108/58 I've asked continue all of her other multiple medications listed and reviewed in her record   Patient Instructions: 1)  Copy sent to : Dr. Ihor Gully and Dr. Dorris Carnes 2)  Please continue current medications.  3)  Colonoscopy and Flexible Sigmoidoscopy brochure given.  4)  Conscious Sedation brochure given.  5)  Labs Pending 6)  Consider endoscopic exam depending on flex sigmoid results. 7)  The medication list was reviewed and reconciled.  All changed / newly prescribed medications were explained.  A complete medication list was provided to the patient / caregiver.

## 2010-07-18 NOTE — Progress Notes (Signed)
Summary: Triage   Phone Note Call from Patient Call back at Home Phone [phone]   Caller: Patient Call For: Dr. Sharlett Iles Reason for Call: Talk to Nurse Summary of Call: Pt. is wanting to know the pharmaceutical company that makes Entocort. She wants to talk to them about the medication.  Initial call taken by: Webb Laws,  July 07, 2009 2:08 PM  Follow-up for Phone Call        Entocort is made by [employer].  Message has been left with [employer] repersenative to get info re pt assistance.  Pt informed. Butch Penny Surface RN  July 07, 2009 3:20 PM   [employer] rep is on vacation this week.  Checked [employer] web site and no pt assistance is availabe per web site.  No answer at pt's home number. Alberteen Spindle RN  July 11, 2009 2:31 PM  Left message for pt call. Follow-up by: Alberteen Spindle RN,  July 13, 2009 9:27 AM  Additional Follow-up for Phone Call Additional follow up Details #1::        Left second message for [employer] rep.  Pt notified that we are trying to get in touch with rep. Butch Penny Surface RN  July 13, 2009 11:03 AM  Lattie Haw with [employer] called back.  No pt assistance program but there is a co-pay program in place.  Pt will have to qualify for this.  Lattie Haw will stop by next week with the info, Additional Follow-up by: Alberteen Spindle RN,  July 14, 2009 10:55 AM

## 2010-08-28 LAB — CBC
HCT: 30.3 % — ABNORMAL LOW (ref 36.0–46.0)
Hemoglobin: 11.6 g/dL — ABNORMAL LOW (ref 12.0–15.0)
MCH: 32.9 pg (ref 26.0–34.0)
MCV: 99.3 fL (ref 78.0–100.0)
RBC: 3.05 MIL/uL — ABNORMAL LOW (ref 3.87–5.11)
RBC: 3.53 MIL/uL — ABNORMAL LOW (ref 3.87–5.11)
RDW: 12.7 % (ref 11.5–15.5)
RDW: 12.8 % (ref 11.5–15.5)
WBC: 17.7 10*3/uL — ABNORMAL HIGH (ref 4.0–10.5)
WBC: 18.1 10*3/uL — ABNORMAL HIGH (ref 4.0–10.5)

## 2010-08-28 LAB — URINE CULTURE
Colony Count: >=100000
Culture  Setup Time: 201201021153

## 2010-08-28 LAB — URINALYSIS, ROUTINE W REFLEX MICROSCOPIC
Hgb urine dipstick: NEGATIVE
Nitrite: POSITIVE — AB
Specific Gravity, Urine: 1.018 (ref 1.005–1.030)
Urobilinogen, UA: 1 mg/dL (ref 0.0–1.0)

## 2010-08-28 LAB — BASIC METABOLIC PANEL
BUN: 17 mg/dL (ref 6–23)
CO2: 25 mEq/L (ref 19–32)
Chloride: 101 mEq/L (ref 96–112)
Glucose, Bld: 142 mg/dL — ABNORMAL HIGH (ref 70–99)

## 2010-08-28 LAB — URINE MICROSCOPIC-ADD ON

## 2010-09-27 LAB — TYPE AND SCREEN

## 2010-09-27 LAB — DIFFERENTIAL
Basophils Absolute: 0.1 10*3/uL (ref 0.0–0.1)
Basophils Relative: 1 % (ref 0–1)
Eosinophils Absolute: 0.1 10*3/uL (ref 0.0–0.7)
Monocytes Absolute: 0.2 10*3/uL (ref 0.1–1.0)
Monocytes Relative: 2 % — ABNORMAL LOW (ref 3–12)
Neutro Abs: 10.5 10*3/uL — ABNORMAL HIGH (ref 1.7–7.7)

## 2010-09-27 LAB — BASIC METABOLIC PANEL
BUN: 17 mg/dL (ref 6–23)
BUN: 9 mg/dL (ref 6–23)
CO2: 27 mEq/L (ref 19–32)
CO2: 28 mEq/L (ref 19–32)
CO2: 28 mEq/L (ref 19–32)
CO2: 29 mEq/L (ref 19–32)
Calcium: 7.8 mg/dL — ABNORMAL LOW (ref 8.4–10.5)
Calcium: 7.8 mg/dL — ABNORMAL LOW (ref 8.4–10.5)
Calcium: 8 mg/dL — ABNORMAL LOW (ref 8.4–10.5)
Calcium: 8.7 mg/dL (ref 8.4–10.5)
Chloride: 101 mEq/L (ref 96–112)
Chloride: 105 mEq/L (ref 96–112)
Creatinine, Ser: 0.63 mg/dL (ref 0.4–1.2)
Creatinine, Ser: 0.91 mg/dL (ref 0.4–1.2)
GFR calc Af Amer: >60 mL/min (ref >60)
GFR calc Af Amer: >60 mL/min (ref >60)
GFR calc Af Amer: >60 mL/min (ref >60)
GFR calc non Af Amer: >60 mL/min (ref >60)
GFR calc non Af Amer: >60 mL/min (ref >60)
GFR calc non Af Amer: >60 mL/min (ref >60)
Glucose, Bld: 105 mg/dL — ABNORMAL HIGH (ref 70–99)
Glucose, Bld: 120 mg/dL — ABNORMAL HIGH (ref 70–99)
Potassium: 4.1 mEq/L (ref 3.5–5.1)
Sodium: 135 mEq/L (ref 135–145)
Sodium: 137 mEq/L (ref 135–145)
Sodium: 138 mEq/L (ref 135–145)

## 2010-09-27 LAB — PREPARE RBC (CROSSMATCH)

## 2010-09-27 LAB — HEMOGLOBIN AND HEMATOCRIT, BLOOD
HCT: 26 % — ABNORMAL LOW (ref 36.0–46.0)
HCT: 26.8 % — ABNORMAL LOW (ref 36.0–46.0)
HCT: 31.1 % — ABNORMAL LOW (ref 36.0–46.0)
HCT: 37.6 % (ref 36.0–46.0)
Hemoglobin: 12.8 g/dL (ref 12.0–15.0)
Hemoglobin: 9.4 g/dL — ABNORMAL LOW (ref 12.0–15.0)

## 2010-09-27 LAB — URINALYSIS, ROUTINE W REFLEX MICROSCOPIC
Hgb urine dipstick: NEGATIVE
Nitrite: NEGATIVE
Urobilinogen, UA: 0.2 mg/dL (ref 0.0–1.0)
pH: 5.5 (ref 5.0–8.0)

## 2010-09-27 LAB — PROTIME-INR
INR: 1.4 (ref 0.00–1.49)
Prothrombin Time: 14.2 seconds (ref 11.6–15.2)
Prothrombin Time: 17.4 seconds — ABNORMAL HIGH (ref 11.6–15.2)
Prothrombin Time: 19.8 seconds — ABNORMAL HIGH (ref 11.6–15.2)

## 2010-09-27 LAB — ABO/RH: ABO/RH(D): O POS

## 2010-09-27 LAB — CBC
Hemoglobin: 11.6 g/dL — ABNORMAL LOW (ref 12.0–15.0)
MCHC: 34.2 g/dL (ref 30.0–36.0)
MCV: 101.5 fL — ABNORMAL HIGH (ref 78.0–100.0)
RDW: 13.3 % (ref 11.5–15.5)

## 2010-09-27 LAB — APTT: aPTT: 27 seconds (ref 24–37)

## 2010-09-29 ENCOUNTER — Encounter: Payer: Self-pay | Admitting: Gastroenterology

## 2010-09-29 ENCOUNTER — Ambulatory Visit (INDEPENDENT_AMBULATORY_CARE_PROVIDER_SITE_OTHER): Payer: Medicare Other | Admitting: Gastroenterology

## 2010-09-29 ENCOUNTER — Other Ambulatory Visit (INDEPENDENT_AMBULATORY_CARE_PROVIDER_SITE_OTHER): Payer: Medicare Other

## 2010-09-29 DIAGNOSIS — D509 Iron deficiency anemia, unspecified: Secondary | ICD-10-CM

## 2010-09-29 DIAGNOSIS — K5010 Crohn's disease of large intestine without complications: Secondary | ICD-10-CM

## 2010-09-29 DIAGNOSIS — E785 Hyperlipidemia, unspecified: Secondary | ICD-10-CM

## 2010-09-29 DIAGNOSIS — K315 Obstruction of duodenum: Secondary | ICD-10-CM

## 2010-09-29 DIAGNOSIS — I10 Essential (primary) hypertension: Secondary | ICD-10-CM

## 2010-09-29 DIAGNOSIS — K625 Hemorrhage of anus and rectum: Secondary | ICD-10-CM

## 2010-09-29 DIAGNOSIS — I2510 Atherosclerotic heart disease of native coronary artery without angina pectoris: Secondary | ICD-10-CM

## 2010-09-29 DIAGNOSIS — J449 Chronic obstructive pulmonary disease, unspecified: Secondary | ICD-10-CM

## 2010-09-29 LAB — CBC WITH DIFFERENTIAL/PLATELET
Basophils Absolute: 0 10*3/uL (ref 0.0–0.1)
Eosinophils Absolute: 0.4 10*3/uL (ref 0.0–0.7)
HCT: 34.3 % — ABNORMAL LOW (ref 36.0–46.0)
Hemoglobin: 11.8 g/dL — ABNORMAL LOW (ref 12.0–15.0)
Lymphs Abs: 1.4 10*3/uL (ref 0.7–4.0)
MCHC: 34.2 g/dL (ref 30.0–36.0)
MCV: 96.9 fl (ref 78.0–100.0)
Monocytes Absolute: 0.3 10*3/uL (ref 0.1–1.0)
Neutro Abs: 4.9 10*3/uL (ref 1.4–7.7)
Platelets: 222 10*3/uL (ref 150.0–400.0)
RDW: 12.9 % (ref 11.5–14.6)

## 2010-09-29 LAB — TSH: TSH: 1.74 u[IU]/mL (ref 0.35–5.50)

## 2010-09-29 LAB — FERRITIN: Ferritin: 40.4 ng/mL (ref 10.0–291.0)

## 2010-09-29 LAB — IGA: IgA: 228 mg/dL (ref 68–378)

## 2010-09-29 LAB — HEPATIC FUNCTION PANEL
AST: 24 U/L (ref 0–37)
Albumin: 3.6 g/dL (ref 3.5–5.2)
Alkaline Phosphatase: 110 U/L (ref 39–117)
Bilirubin, Direct: 0 mg/dL (ref 0.0–0.3)
Total Bilirubin: 0.4 mg/dL (ref 0.3–1.2)

## 2010-09-29 LAB — BASIC METABOLIC PANEL
BUN: 15 mg/dL (ref 6–23)
Chloride: 103 mEq/L (ref 96–112)
Potassium: 4.4 mEq/L (ref 3.5–5.1)
Sodium: 141 mEq/L (ref 135–145)

## 2010-09-29 LAB — MAGNESIUM: Magnesium: 2.1 mg/dL (ref 1.5–2.5)

## 2010-09-29 MED ORDER — PEG-KCL-NACL-NASULF-NA ASC-C 100 G PO SOLR: 1.0000 | Freq: Once | ORAL | Status: AC

## 2010-09-29 NOTE — Progress Notes (Signed)
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 mostly colonic involvent t but also duodenal stricturing  She currently is asymptomatic in terms of GI complaints except for occasional hemorrhoidal bleeding. She is not on any specific therapy for her inflammatory bowel disease. She has been found to have a severe anemia with a hemoglobin of 9.7, with at least one stool guaiac cord was positive. She does have chronic early satiety but denies nausea and vomiting, reflux symptoms or dysphagia. Medications include daily PPI . She also has severe COPD from previous smoking. She has had recent hospitalization because of pneumonia. Previously she was abusing NSAIDs but has not used these in several years. She does not smoke or use ethanol. Other problems include  severe osteoporosis, hyperlipidemia, and hypertension. And also Plavix for previous coronary artery bypass surgery. l  I have reviewed this patient's present history, medical and surgical past history, allergies and medications.    Past Medical History  Diagnosis Date  . Heart disease   . Crohn's disease   . Duodenal stricture   . Esophageal reflux   . Hypertension   . Hyperlipemia   . Hiatal hernia   . Pneumonia    Past Surgical History  Procedure Date  . Coronary artery bypass graft   . Carotid endarterectomy   . Hip surgery     right  . Abdominal hysterectomy   . Cervical disc surgery   . Bunionectomy 1977    both feet     reports that she quit smoking about 14 years ago. Her smoking use included Cigarettes. She has never used smokeless tobacco. She reports that she drinks about 3.5 - 7 ounces of alcohol per week. She reports that she does not use illicit drugs. family history includes Colon polyps in her father and Heart disease in her father. No Known Allergies    ROS: The remainder of the 10  point ROS is negative. She does have some mild chronic C8 denies other systemic complaints. Also she complains of dyspnea on exertion and chronic shortness of breath. Currently she is not coughing or having hemoptysis. She follows a regular diet and denies a specific food intolerances. No history of known hepatitis or pancreatitis. Last colonoscopy and endoscopy were 4-5 years ago. Previously she was on Entocort or Crohn's colitis but has not used this in several years. Other surgeries include orthopedic procedur andes carotid endarterectomy. She denies current cardiovascular, neurologic, or endocrine problems.   Physical Exam: General well developed well nourished patient in no acute distress, appearing their stated age Eyes PERRLA, no icterus, fundoscopic exam per opthamologist Skin no lesions noted Neck supple, no adenopathy, no thyroid enlargement, no tenderness Chest clear to percussion and auscultation Heart no significant murmurs, gallops or rubs noted Abdomen no hepatosplenomegaly masses or tenderness, BS normal.  Rectal inspection normal no fissures, or fistulae noted.  No masses or tenderness on digital exam. Stool guaiac negative. Extremities no acute joint lesions, edema, phlebitis or evidence of cellulitis. Neurologic patient oriented x 3, cranial nerves intact, no focal neurologic deficits noted. Psychological mental status normal and normal affect.  Assessment and plan: Her anemia is probably related to both iron and B12 deficiency associated with her inflammatory bowel disease and hemorrhoidal bleeding. I have scheduled her for followup colonoscopy and will check anemia profile and followup CBC. She may need followup endoscopy, but for now,  I will continue daily PPI therapy. We will hold her Plavix 5 days before colonoscopy unless otherwise advised by Dr. [name].  No diagnosis found.

## 2010-09-29 NOTE — Patient Instructions (Signed)
Your procedure has been scheduled for 10/04/2010, please follow the seperate instructions. Stop your Plavix per Dr Sharlett Iles on 09/29/2010 Please go to the basement today for your labs.  Your prescription(s) have been sent to you pharmacy.

## 2010-10-02 LAB — GLIA (IGA/G) + TTG IGA
Gliadin IgG: 6.1 U/mL (ref <20)
Tissue Transglutaminase Ab, IgA: 6.9 U/mL (ref <20)

## 2010-10-03 ENCOUNTER — Encounter: Payer: Self-pay | Admitting: Gastroenterology

## 2010-10-04 ENCOUNTER — Encounter: Payer: Self-pay | Admitting: Gastroenterology

## 2010-10-04 ENCOUNTER — Ambulatory Visit (AMBULATORY_SURGERY_CENTER): Payer: Medicare Other | Admitting: Gastroenterology

## 2010-10-04 DIAGNOSIS — D509 Iron deficiency anemia, unspecified: Secondary | ICD-10-CM

## 2010-10-04 DIAGNOSIS — R197 Diarrhea, unspecified: Secondary | ICD-10-CM

## 2010-10-04 DIAGNOSIS — D649 Anemia, unspecified: Secondary | ICD-10-CM

## 2010-10-04 DIAGNOSIS — K5289 Other specified noninfective gastroenteritis and colitis: Secondary | ICD-10-CM

## 2010-10-04 MED ORDER — SODIUM CHLORIDE 0.9 % IV SOLN: 500.0000 mL | INTRAVENOUS | Status: DC

## 2010-10-04 NOTE — Patient Instructions (Signed)
Please refer to blue and neon green sheet for discharge instructions regarding diet and activity for the rest of today.  Normal Examination  You may resume your Plavix TODAY Resume all other medications as you would normally take them

## 2010-10-04 NOTE — Progress Notes (Signed)
Dr Sharlett Iles is aware pt has been off Plavix for 5 days, last dose was 09/29/2010.

## 2010-10-05 ENCOUNTER — Telehealth: Payer: Self-pay | Admitting: *Deleted

## 2010-10-05 NOTE — Telephone Encounter (Signed)

## 2010-10-10 ENCOUNTER — Telehealth: Payer: Self-pay | Admitting: *Deleted

## 2010-10-10 ENCOUNTER — Encounter: Payer: Self-pay | Admitting: *Deleted

## 2010-10-10 MED ORDER — BUDESONIDE 3 MG PO CP24: 9.0000 mg | ORAL_CAPSULE | ORAL | Status: DC

## 2010-10-10 NOTE — Telephone Encounter (Signed)
Notified Cristina Parker Dr Sharlett Iles received her Path report and she has collagenous colitis. We will order Entocort-[employer]- and she needs a f/u appt. Cristina Parker scheduled for 11/03/10 at 10:30am. Cristina Parker verbalized understanding.

## 2010-10-10 NOTE — Telephone Encounter (Signed)
Message copied by Shella Maxim on Tue Oct 10, 2010  8:41 AM ------      Message from: Sharlett Iles, DAVID      Created: Mon Oct 09, 2010  5:24 PM       START BUDESONIDE 9 MG/DAY..SEE NME 1 MONTH

## 2010-10-17 HISTORY — PX: EYE SURGERY: SHX253

## 2010-10-18 ENCOUNTER — Encounter: Payer: Self-pay | Admitting: Gastroenterology

## 2010-10-31 NOTE — Procedures (Signed)
CAROTID DUPLEX EXAM   INDICATION:  Carotid artery disease.   HISTORY:  Diabetes:  No.  Cardiac:  No.  Hypertension:  Yes.  Smoking:  Previous.  Previous Surgery:  Aorta to innominate artery bypass graft on  07/15/2003, left carotid endarterectomy in 1999.  CV History:  Currently asymptomatic.  Amaurosis Fugax No, Paresthesias No, Hemiparesis No                                       RIGHT             LEFT  Brachial systolic pressure:         110               118  Brachial Doppler waveforms:         Normal            Normal  Vertebral direction of flow:        Antegrade/atypical                  Antegrade  DUPLEX VELOCITIES (cm/sec)  CCA peak systolic                   94                643  ECA peak systolic                   170               329  ICA peak systolic                   305               68  ICA end diastolic                   72                19  PLAQUE MORPHOLOGY:                  Mixed             Mixed  PLAQUE AMOUNT:                      Moderate/severe   Mild  PLAQUE LOCATION:                    ICA/ECA           ICA/CCA   IMPRESSION:  1. Doppler velocities suggest a high end 60-79% stenosis of the right      internal carotid artery with no string sign present.  2. 1-39% stenosis of the left internal carotid artery.  3. The antegrade right vertebral artery flow demonstrates mild early      systolic deceleration.  4. Mild right subclavian artery stenosis is noted.  5. Patent aorta to innominate artery bypass graft noted.  6. No significant change noted when compared to the previous exam on      04/02/2005.   ___________________________________________  P. Drucie Opitz, M.D.   CH/MEDQ  D:  12/02/2008  T:  12/02/2008  Job:  [id number]

## 2010-10-31 NOTE — Op Note (Signed)
NAMEODESSER, TOURANGEAU               ACCOUNT NO.:  [id number]   MEDICAL RECORD NO.:  [id number]          PATIENT TYPE:  EMS   LOCATION:  ED                           FACILITY:  [facility]   PHYSICIAN:  Kipp Brood. Gioffre, M.D.DATE OF BIRTH:  29-Jul-1935   DATE OF PROCEDURE:  10/10/2008  DATE OF DISCHARGE:                               OPERATIVE REPORT   PREOPERATIVE DIAGNOSIS:  Comminuted intertrochanteric subtrochanteric  fracture right hip.   POSTOPERATIVE DIAGNOSIS:  Comminuted intertrochanteric subtrochanteric  fracture right hip.   OPERATION:  1. Open reduction and internal fixation of intertrochanteric fracture      right hip utilizing a TK2 hip compression nail plate device.  2. Operation with bone grafting utilizing cancellus bone chips with      bone bank into the subtroch/intertroch area.   SURGEON:  Kipp Brood. Gladstone Lighter, M.D.   ASSISTANT:  Erskine Emery, P.A.   PROCEDURE IN DETAIL:  Under general anesthesia with the patient on the  fracture table I did initial closed reduction of the hip with the hip in  traction.  Following that I did an initial prep then a sterile prep.  We  did visualize the hip preop on the fracture table with the C-arm.  We  had a good anatomical reduction at that time.  Following that after we  did a sterile prep and draping of the hip I made an incision over the  lateral aspect of the right hip.  Bleeders were identified and  cauterized.  Incision was carried down to the greater trochanteric  region.  Drill hole was made with a 9/64 inch drill bit in the lateral  cortex of the femur.  Guide pin was inserted at 135 degree angle and we  followed the guide pin up on the AP and lateral view and had an anatomic  position of the pin.  We then measured the pin to be 90 mm in length.  I  then tapped the fracture site with a tap.  Following that we utilized  the cortical step reamer to drill a hole up over the pin across the  fracture site into the femoral head.   I then inserted my 135 degree  angle compression plate with 5 holes and a 90 mm in length compression  screw and affixed it to the femoral shaft in the usual fashion.  Note  the fracture was stable at the time of the procedure after we fixed the  fracture.  We released the traction and inserted a small compression  screw to compress the fracture site.  Following that I used cancellus  bone chips and bone grafted the medial defect of the fracture site.  We  irrigated the wound out and I injected 10 mL  of FloSeal into the wound site for hemostasis purposes because she was  on Plavix and aspirin preop.  Following that I inserted some thrombin  soaked Gelfoam and closed the wound in layers in the usual fashion.  Sterile Neosporin dressing was applied.  The patient left the operative  room in satisfactory condition.  ______________________________  Kipp Brood Gladstone Lighter, M.D.     RAG/MEDQ  D:  10/10/2008  T:  10/10/2008  Job:  [id number]   cc:   Carlena Bjornstad, MD, [place] [place]  [place] [address]

## 2010-10-31 NOTE — Procedures (Signed)
CAROTID DUPLEX EXAM   INDICATION:  Followup known carotid artery disease.   HISTORY:  Diabetes:  No.  Cardiac:  No.  Hypertension:  Yes.  Smoking:  Quit.  Previous Surgery:  Aorta to innominate bypass graft on 07/15/2003 and  left CEA 1999.  CV History:  No.  Amaurosis Fugax No, Paresthesias No, Hemiparesis No                                       RIGHT             LEFT  Brachial systolic pressure:         110               120  Brachial Doppler waveforms:         Biphasic          Biphasic  Vertebral direction of flow:        Atypical          Antegrade  DUPLEX VELOCITIES (cm/sec)  CCA peak systolic                   122               64  ECA peak systolic                   326               086  ICA peak systolic                   299               96  ICA end diastolic                   96                36  PLAQUE MORPHOLOGY:                  Calcified         Heterogeneous  PLAQUE AMOUNT:                      Moderate to severe                  Mild  PLAQUE LOCATION:                    ICA and ECA       ICA and ECA   IMPRESSION:  1. High end 60%-79% stenosis noted in the right internal carotid      artery.  2. 1%-39% stenosis noted in the left internal carotid artery.  3. Status post left carotid endarterectomy.  4. Antegrade bilateral vertebral arteries.   ___________________________________________  Jessy Oto Fields, MD   MG/MEDQ  D:  06/03/2009  T:  06/04/2009  Job:  [id number]

## 2010-10-31 NOTE — Discharge Summary (Signed)
Cristina Parker               ACCOUNT NO.:  [id number]   MEDICAL RECORD NO.:  [id number]          PATIENT TYPE:  INP   LOCATION:  [place]                         FACILITY:  [facility]   PHYSICIAN:  Kipp Brood. Gioffre, M.D.DATE OF BIRTH:  [birth year]   DATE OF ADMISSION:  10/10/2008  DATE OF DISCHARGE:                               DISCHARGE SUMMARY   Patient being transferred to [facility] today.   [name] was admitted through the emergency room by me on October 10, 2008 with a severe comminuted intertrochanteric/subtrochanteric type  fracture.  She was taken to surgery that same afternoon at which time I  did an open reduction and internal fixation of the fracture listed  above.  At that time we utilized a TK2 hip compression nail device.  While in the operating room, I elected to give her 2 units of packed  cells.  We did follow hemoglobin postop in the usual fashion.  She was  also maintained on the Coumadin/heparin protocol.  On October 11, 2008 she  was doing well.  We discontinued her Foley catheter.  Hemoglobin was  10.5 and her dressing was dry, and she was moving her foot well.  On the  following day on October 12, 2008 I changed her dressing, the wound looked  good.  Her hemoglobin was 9.1 and I ordered more hemoglobin for that day  in the morning.  Plans were made that day to transfer to a skilled  nursing facility on Wednesday morning, the 28th.  Her INR was maintained  by the pharmacist.  On October 13, 2008 she was doing fine, she was up in  a chair, hemoglobin was 9.4 and she had no medical issues.  We elected  to transfer her to the nursing facility.  The EKG showed a normal sinus  rhythm with ST and T-wave abnormality, consider anterior ischemia.  She  had multiple other lab tests done.  The lab values remained stable.  The  hemoglobin today on October 13, 2008 was 9.4, hematocrit 26.  The  chemistries were sodium 137, potassium 3.1, chloride 101, BUN of 8,  creatinine 0.63, glucose 115.  Her INR was 1.6.  She had a vascular  evaluation for her right and left carotid arteries.  There appeared to  be greater than 80% stenosis proximally on the right, 60-80% apparently  on the opposite side.  We will have Dr. Amedeo Plenty evaluate her for that  since he was the doctor that ordered that.   As far as her vital signs, she was stable.  Her blood pressure was  121/73, heart rate 110, respirations 16, temperature 97.6, saturation  95.   DISCHARGE CONDITION:  Improved.   DISCHARGE DIET:  To be on her regular preop diet.   DISCHARGE MEDICATIONS:  1. She is on Toprol 100 mg 1/2 tablet daily.  2. Lisinopril 10 mg daily.  3. Plavix 75 mg daily which we will not give, we will hold the Plavix      because she is going to go home on Coumadin 5 mg daily.  4. Lipitor  40 mg daily.  5. Lexapro 20 mg daily.  6. Nexium 40 mg daily.  7. Vicodin 1 every 4 hours p.r.n. for pain.  8. Vitamin C 500 mg daily.   DISCHARGE DIAGNOSES:  1. Positive for coronary artery disease.  2. Hypertension.  3. Cervical spondylosis with a previous fusion.  4. Hypercholesterolemia.  5. Questionable emphysema.  6. Intertrochanteric/subtrochanteric fracture of the right hip.   DISCHARGE INSTRUCTIONS:  1. She will go bed to chair only.  I do not want her to carry any      weight whatsoever on that right extremity.  2. She will see me in the office 2 weeks from the day of surgery.  3. She will be on Coumadin 5 mg daily for an anticoagulant.  4. She is going to need to have an INR done weekly.  The next INR      should be done here on Monday of this coming week and then weekly.   NOTE:  SHE NEEDS TO SEE ME IN THE OFFICE.  CALL THE OFFICE, DR. Gladstone Lighter  AT [phone].           ______________________________  Kipp Brood. Gladstone Lighter, M.D.     RAG/MEDQ  D:  10/13/2008  T:  10/13/2008  Job:  [id number]

## 2010-10-31 NOTE — Assessment & Plan Note (Signed)
[facility]                         GASTROENTEROLOGY OFFICE NOTE   NAME:Wahlberg, EASTON SIVERTSON                      MRN:          [id number]  DATE:11/28/2006                            DOB:          [birth year]    I have known Avagail Whittlesey for many years.  She was a former Marine scientist at  [employer].  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  I have dilated this on several occasions, the  last dilation taking place in October 2004.  She has really done well  taking daily Nexium until the last week, when she has recurrent early  satiety, nausea, but no vomiting.   PAST MEDICAL HISTORY:  Remarkable for previous colitis felt secondary to  Crohn's disease.  There is some possibility the stricture in her  duodenum is from Crohn's disease.  However, she denies any lower bowel  problems, and really is not on any amino salicylates at this time.  She  does have coronary artery disease with bypass surgery in January 2005.  She has normal left ventricular function.  She has also had a previous  left carotid endarterectomy, and has hypertension and hyperlipidemia.  She has a history of stable ventricular arrhythmias.   Last colonoscopy  showed deep layer ulcers of her colon.   MEDICATIONS:  1. Plavix 75 mg a day.  2. Aspirin 81 mg a day.  3. Prinizide 10/12.5 mg a day.  4. Nexium 40 mg a day.  5. Citracal with vitamin D twice a day.  6. Toprol XL 50 mg a day.  7. Vitamin C 1000 mg a day.  8. Lipitor 40 mg a day.  9. Lexapro 20 mg a day.   EXAMINATION:  She is a healthy-appearing white female in no distress  appearing her stated age.  She weighs 132 pounds, which is really close to her normal weight.  Blood pressure 154/68.  She appeared to be in a regular rhythm.  Pulse  is 72.  ABDOMINAL EXAM:  She had no organomegaly, masses, or localized  tenderness.   Bowel sounds were present.  She did have a slight  succussion splash noted.   ASSESSMENT:  1. Ms. Cozza obviously has recurrent duodenal obstruction, and      whether or not this is from inflammatory bowel disease remains      unclear.  I am fairly sure that she has been tested on several      occasions for Helicobacter infection.  2. Probable smoldering Crohn's colitis with need for followup      colonoscopy and screening.  3. Coronary artery disease with bypass surgery.  4. Hypertensive cardiovascular disease with hyperlipidemia.  5. Peripheral vascular disease with previous endarterectomy and      anticoagulation.  6. Normal left ventricular function per Dr. Ron Parker.  7. Rule out metabolic dysfunction.   RECOMMENDATIONS:  1. Continue low fiber diet and daily Nexium.  2. Outpatient endoscopy and colonoscopy.  3. Screening laboratory parameters.  4. Continue anticoagulants for endoscopic  procedures.  5. We will use balloon dilators with possible dilator duodenum.  I      suspect she will need to possibly consider chronic      immunosuppressant therapy.  6. Continue other cardiac medications.     Loralee Pacas. Sharlett Iles, MD, [facility], [place]  Electronically Signed    DRP/MedQ  DD: 11/28/2006  DT: 11/28/2006  Job #: [id number]   cc:   Carlena Bjornstad, MD, [facility]  Nelda Severe. Juventino Slovak, M.D.  Dorothea Glassman, M.D.

## 2010-10-31 NOTE — H&P (Signed)
Cristina Parker NO.:  [id number]   MEDICAL RECORD NO.:  [id number]          PATIENT TYPE:  INP   LOCATION:  0113                         FACILITY:  [facility]   PHYSICIAN:  Cristina Parker, M.D.DATE OF BIRTH:  1936-04-24   DATE OF ADMISSION:  10/03/2007  DATE OF DISCHARGE:                              HISTORY & PHYSICAL   PRIMARY CARE PHYSICIAN:  Cristina Parker, M.D.   CHIEF COMPLAINT:  Shortness of breath with cough, fevers, and chills.   HISTORY OF PRESENT ILLNESS:  [name] is a pleasant 75 year old  female with a complex medical history as detailed below.  She states  that she was in her usual state of health until approximately 10 days  ago, when she began to expectorate purulent sputum.  She was otherwise  in her usual state of health.  After approximately 3 to 4 days, she  began to notice significant dyspnea on exertion.  There was no chest  pain or substernal chest pressure.  She then began to notice significant  difficulty with sleeping at night and intermittent fevers and chills for  the last 48 hours.  Today she was more short of breath with physical  exertion, and, given the recent onset of her fever, she decided she had  better report for medical evaluation.  In the emergency room, chest x-  ray has revealed an evolving infiltrate in the right upper lobe.  The  patient was found to have an elevated white count.  She is being  admitted to the acute units for a community-acquired right upper lobe  pneumonia.  She has no other complaints at this time with the exception  of chronic low back pain.   REVIEW OF SYSTEMS:  Comprehensive review of systems is unremarkable with  the exception of multiple positive elements noted in the history of  present illness above.   PAST MEDICAL HISTORY:  1. Hypercholesterolemia.  2. Osteoporosis.  3. Coronary artery disease, status post coronary artery bypass graft      January 2005 by Dr. Gilford Raid.  4. Hypertension.  5. 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  6. Chronic colitis, thought to be possibly Crohn's disease.  7. Status post left carotid endarterectomy.  8. C-section x 2.  9. Status post hysterectomy.  10.History of thoracic compression fractures.  11.Remote history of tobacco abuse but none for multiple years.   OUTPATIENT MEDICATIONS:  Aspirin 81 mg daily, Lipitor 40 mg q.h.s.,  lisinopril 10 mg daily, Nexium 40 mg daily, Plavix 75 mg daily, Toprol  XL 50 mg daily,  Vicodin p.r.n.   ALLERGIES:  No known drug allergies.   FAMILY HISTORY:  Noncontributory this admission.   SOCIAL HISTORY:  The patient lives in [place] with her son.  Her  husband died about one year ago.  She is a retired Programmer, applications., working at  [employer].  She occasionally partakes of alcohol.  She does not  presently smoke.   DATA REVIEW:  BMET is wholly unremarkable.  CBC is remarkable for an  elevated white count at 18.4 with a normal hemoglobin and normal  platelet count.  Urinalysis reveals moderate leukocyte esterase, 30 of  protein, 3 to 6 white blood cells.  Chest x-ray reveals an evolving  infiltrate in the right upper lobe.   PHYSICAL EXAMINATION:  VITAL SIGNS:  Temperature 98.5, blood pressure  118/77, heart rate 119, respiratory rate 18, O2 sat 95% on room air.  GENERAL:  A thin, frail-appearing female who appears older than stated  age who is in mild respiratory distress but able to complete full  sentences and carry a conversation.  HEENT:  Normocephalic, atraumatic.  Pupils equal, round, and reactive to  light and accommodation.  Extraocular muscles intact.  NECK:  No JVD.  No lymphadenopathy.  LUNGS:  Clear to auscultation throughout with the exception of bibasilar  crackles with no expiratory wheeze and possible fine crackles in the  right upper lobe region.  CARDIOVASCULAR:  Distant but  regular without gallop or rub with normal  S1 and S2.  ABDOMEN:  Nontender, nondistended.  Soft.  Bowel sounds present.  No  hepatosplenomegaly.  No rebound.  No ascites.  EXTREMITIES:  Trace bilateral lower extremity edema without cyanosis or  clubbing.  NEUROLOGIC:  Alert and oriented x 4.  Cranial nerves II-XII intact. 5/5  strength, bilateral upper and lower extremities.  Intact sensation  throughout.  No Babinski.   IMPRESSION AND PLAN:  1. Right upper lobe community-acquired pneumonia - the patient will be      admitted to the acute units.  Her O2 sats will be monitored.  She      will be treated with supplemental oxygen.  Empiric coverage will be      provided with Rocephin and azithromycin.  We will follow clinical      course and repeat chest x-ray within 48 hours.  2. History of coronary artery disease - the patient is status post      coronary artery bypass graft.  She has no complaints to suggest a      recurrence of her coronary disease.  We will continue her aspirin,      her lisinopril, her Lipitor, her Plavix and her Toprol.  3. Hypercholesterolemia - we will continue Lipitor therapy.  4. History of duodenal stenosis - the patient has no symptoms to      suggest restenosis.  She is followed by [facility] should she      develop GI issues during her hospital stay.  5. Osteoporosis with chronic low back pain - we will continue narcotic      pain medications on a p.r.n. basis.      Cristina Parker, M.D.  Electronically Signed     JTM/MEDQ  D:  10/03/2007  T:  10/03/2007  Job:  [id number]   cc:   Cristina Parker, M.D.  Fax: [phone]

## 2010-10-31 NOTE — H&P (Signed)
Cristina Parker, Cristina Parker               ACCOUNT NO.:  [id number]   MEDICAL RECORD NO.:  [id number]          PATIENT TYPE:  EMS   LOCATION:  ED                           FACILITY:  [facility]   PHYSICIAN:  Kipp Brood. Gioffre, M.D.DATE OF BIRTH:  June 29, 1935   DATE OF ADMISSION:  10/10/2008  DATE OF DISCHARGE:                              HISTORY & PHYSICAL   CHIEF COMPLAINTS:  Right hip pain.   HISTORY OF PRESENT ILLNESS:  Ms. Waldron is a pleasant 75 year old  female who was walking into church this morning when she fell and  injured her hip.  She is unsure of the exact mechanism of the injury,  but states she did not lose consciousness or have any dizziness.  Unable  to bear weight after the fall due to the injury of her right hip.  Last  meal was the night prior.   ALLERGIES:  NO KNOWN DRUG ALLERGIES.   MEDICATIONS:  1. Toprol 100 mg, 1/2 tablet p.o. daily.  2. Lisinopril 10 mg, one tablet daily.  3. Plavix 75 mg, one p.o. daily.  4. Lipitor 40 mg, one p.o. daily.  5. Lexapro 20 mg, one p.o. daily.  6. Nexium 40 mg, one p.o. daily  7. Aspirin 81 mg, one p.o. daily.  8. Vicodin one tablet p.o. q. 2-3h. p.r.n. pain.  9. Vitamin C 500 mg, one p.o. daily.   PAST MEDICAL HISTORY:  1. Positive for coronary artery disease.  2. Hypertension.  3. Cervical degenerative disk disease with fusion.  4. Hypercholesteremia.  5. Questionable emphysema.  6. Lower lumbar pain.   PAST SURGICAL HISTORY:  1. Coronary artery bypass graft surgery.  2. Cervical fusion.  3. Hysterectomy.  4. Bilateral bunionectomies.   SOCIAL HISTORY:  The patient has a history of smoking, but quit in 1998.  She lives at home.   REVIEW OF SYSTEMS:  The patient denies any recent chest pain with  exertion.  She denies any PND or orthopnea.  She states she does have  shortness of breath with exertion.  History of  a coronary artery bypass  graft surgery. History of hypertension and hypercholesterolemia and   emphysema.   PHYSICAL EXAMINATION:  VITAL SIGNS:  Blood pressure is on 95/57, pulse  71, 93% on room air, afebrile.  GENERAL:  Patient is alert and oriented.  CARDIAC:  Regular  rate and rhythm.  No murmurs, rubs or gallops noted.  CHEST:  Clear to auscultation bilaterally.  No wheezing or rhonchi.  ABDOMEN:  Nontender throughout.  Bowel sounds times four quadrants.  EXTREMITIES:  Right lower extremity:  The right leg is shortened and  externally rotated.  Dorsal pedal pulses 2+.  Bilateral EHL and FHL  intact.  NEUROLOGIC:  Bilateral sensation to light touch is intact throughout  lower extremity.   LABORATORY DATA:  CBC:  White count 11.6, hemoglobin 11.6, hematocrit  34.1, platelets 255,000.  Chemistry:  Sodium is  138, potassium 4.1,  chloride 125, bicarb 27, BUN 17, creatinine 0.73, glucose 128.  PT is  27.0, INR is 1.0.   RADIOLOGY:  A chest X-Ray shows  a right upper lobe density, questionable  scarring, cardiac enlargement without heart failure.  Right hip two  views,  shows a comminuted intertrochanteric fracture.   ASSESSMENT/PLAN:  The patient is a 75 year old white female with a  history of a coronary artery bypass graft surgery, on Plavix and  aspirin, hypertension and gastroesophageal reflux disease, questionable  emphysema.  She is status post a fall this a.m., resulting in a right  hip fracture.  1. Right hip compression screw by Kipp Brood. Gioffre.  2. Admit to  Dr. Edrick Oh postop for observation and physical therapy.  3. Ancef 1 gram IV one hour preop.  4. Probable CT scan of chest, to better evaluate right upper lobe      density.      Erskine Emery, P.A.    ______________________________  Kipp Brood Gladstone Lighter, M.D.    GC/MEDQ  D:  10/10/2008  T:  10/10/2008  Job:  [id number]

## 2010-10-31 NOTE — Assessment & Plan Note (Signed)
OFFICE VISIT   WANA, MOUNT  DOB:  [birth year]                                       12/02/2008  CHART#:[id number]   The patient recently suffered a hip fracture and was in [facility].  At that time she underwent a carotid Doppler which had been  scheduled to be done here in the office.  The carotid Doppler revealed a  moderate to severe right internal carotid artery stenosis.   She has a history of a left carotid endarterectomy and also an aorto-  innominate bypass procedure.  Her carotid surgery was carried out in  1999 and her aorto-innominate bypass in 2005.   Her Dopplers were repeated in the office today, she has a moderate to  severe right internal carotid artery stenosis in the high range of 60-  79% and her left ICA is widely patent.  There is antegrade vertebral  flow noted on the right, mild atypical waveforms and antegrade left  vertebral flow.   The patient has been progressing well, she is now out of the nursing  home and at home.  She has had no sensory, motor or visual deficits.   Her blood pressure is 96/62 in the left arm, 84/54 in the right arm,  pulse is 74 per minute.  She has soft carotid bruits bilaterally.  Cranial nerves intact.  Strength equal bilaterally.   The patient shows evidence of moderate to severe right ICA stenosis, no  evidence of significant left ICA restenosis.  Will plan followup again  in 6 months with a repeat carotid Doppler.   Dorothea Glassman, M.D.  Electronically Signed   PGH/MEDQ  D:  12/02/2008  T:  12/03/2008  Job:  [id number]   cc:   Nelda Severe. Juventino Slovak, M.D.  Carlena Bjornstad, MD, [facility]  Ermalene Searing. Philip Aspen, M.D.  Ronald A. Gladstone Lighter, M.D.

## 2010-11-02 ENCOUNTER — Encounter: Payer: Self-pay | Admitting: *Deleted

## 2010-11-03 ENCOUNTER — Other Ambulatory Visit (INDEPENDENT_AMBULATORY_CARE_PROVIDER_SITE_OTHER): Payer: Medicare Other

## 2010-11-03 ENCOUNTER — Telehealth: Payer: Self-pay | Admitting: *Deleted

## 2010-11-03 ENCOUNTER — Encounter: Payer: Self-pay | Admitting: Gastroenterology

## 2010-11-03 ENCOUNTER — Ambulatory Visit (INDEPENDENT_AMBULATORY_CARE_PROVIDER_SITE_OTHER): Payer: Medicare Other | Admitting: Gastroenterology

## 2010-11-03 VITALS — BP 122/72 | HR 64 | Ht 60.0 in | Wt 123.0 lb

## 2010-11-03 DIAGNOSIS — K52831 Collagenous colitis: Secondary | ICD-10-CM

## 2010-11-03 DIAGNOSIS — Z79899 Other long term (current) drug therapy: Secondary | ICD-10-CM

## 2010-11-03 DIAGNOSIS — D509 Iron deficiency anemia, unspecified: Secondary | ICD-10-CM

## 2010-11-03 DIAGNOSIS — K5289 Other specified noninfective gastroenteritis and colitis: Secondary | ICD-10-CM

## 2010-11-03 LAB — VITAMIN B12: Vitamin B-12: 338 pg/mL (ref 211–911)

## 2010-11-03 LAB — CBC WITH DIFFERENTIAL/PLATELET
Basophils Absolute: 0.1 10*3/uL (ref 0.0–0.1)
Hemoglobin: 11.2 g/dL — ABNORMAL LOW (ref 12.0–15.0)
Lymphocytes Relative: 11.1 % — ABNORMAL LOW (ref 12.0–46.0)
Monocytes Relative: 2.6 % — ABNORMAL LOW (ref 3.0–12.0)
Neutro Abs: 10 10*3/uL — ABNORMAL HIGH (ref 1.4–7.7)
RDW: 14.7 % — ABNORMAL HIGH (ref 11.5–14.6)
WBC: 11.9 10*3/uL — ABNORMAL HIGH (ref 4.5–10.5)

## 2010-11-03 LAB — FERRITIN: Ferritin: 29.3 ng/mL (ref 10.0–291.0)

## 2010-11-03 LAB — IBC PANEL
Iron: 97 ug/dL (ref 42–145)
Transferrin: 303.3 mg/dL (ref 212.0–360.0)

## 2010-11-03 NOTE — Cardiovascular Report (Signed)
NAMEKELEE, CUNNINGHAM                         ACCOUNT NO.:  [id number]   MEDICAL RECORD NO.:  [id number]                   PATIENT TYPE:  OIB   LOCATION:  6501                                 FACILITY:  [place]   PHYSICIAN:  Jenkins Rouge, M.D.                  DATE OF BIRTH:  [birth year]   DATE OF PROCEDURE:  DATE OF DISCHARGE:                              CARDIAC CATHETERIZATION   INDICATIONS:  Ms. Skarda is a pleasant 75 year old patient with known  aortic arch disease.  She will need a median sternotomy for grafting of the  right innominate artery because of her extensive arch disease.  Catheterization was indicated for preoperative clearance to exclude coronary  artery disease.   PROCEDURE:  Standard catheterization was done with 5-French catheters from  the right femoral artery.  Great care was taken to exchange catheters over a  J-wire at the level of the aortic valve.   There was extensive calcification in the area of the aorta between the right  innominate and left subclavian.  There was also heavy calcification of the  LAD.   We cannulated the left main with 3.5 Judkins left catheter.   Left main had a 20% discreet stenosis.   The proximal LAD had a 70-80% calcified eccentric stenosis.  Mid and distal  vessel were normal.  Diagonal branches were small.   Circumflex coronary artery had a 30% mid body lesion.   Right coronary artery was dominant.  It was normal.   RAO ventriculography was normal.  The ejection fraction was in excess of  60%.  There was no gradient across the aortic valve and no MR.  Aortic  pressure was 159/71.  LV pressure was 159/6 with a post A-wave EDP of 17.   Aortic arch injection was performed as a roadmap for surgery.   The right innominate was extremely calcified and there was no evidence of  flow going up the innominate by nonselective injection.   There also appeared to be a 50-60% left subclavian stenosis.   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  I spoke with Dr. Drucie Opitz.  The patient will  require a median sternotomy to repair her arch.  It will be easy at that  time to place a graft to left anterior descending.   I suspect she will need a graft to the base of the right innominate as well  as a graft to the left subclavian distal to the obstruction.  That way, the  patient could use an in situ left internal mammary artery graft to the left  anterior descending.   The patient will follow up with Dr. Ron Parker in [facility] to plan her surgery.  Jenkins Rouge, M.D.    PN/MEDQ  D:  06/08/2003  T:  06/08/2003  Job:  [id number]   cc:   Gilford Raid, M.D.  [address]  [place]  [place] [address]   P. Drucie Opitz, M.D.  [address]  [place]  [place] [address]   Dola Argyle, M.D.

## 2010-11-03 NOTE — Progress Notes (Signed)
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 Colonoscopy was unremarkable except for biopsies that did confirm a previous diagnosis of collagenous colitis. She was treated with Entocort 9 mg a day for one month and currently is asymptomatic. She does have hemorrhoids which apparently calls occasional bleeding, but otherwise denies gastrointestinal problems. He is on aspirin 81 mg a day but denies other NSAID use. She also takes daily Protonix for acid reflux.    Current Medications, Allergies, Past Medical History, Past Surgical History, Family History and Social History were reviewed in [employer] record.   Assessment and plan: Cristina Parker denies diarrhea at this time and I will taper her off of her Entocort with when necessary use as needed. Repeat CBC and anemia profile ordered today. She is to continue other medications as per Dr. Silvestre Mesi primary care. We'll see her as needed in the future.  Encounter Diagnosis  Name Primary?  . Collagenous colitis Yes    Please copy her primary care physician, referring physician, and pertinent subspecialists.

## 2010-11-03 NOTE — Telephone Encounter (Signed)
Notified pt that Dr Sharlett Iles wants her to continue her Iron. Went over lab results with her. Pt stated understanding.

## 2010-11-03 NOTE — Op Note (Signed)
Cristina Parker, Cristina Parker                         ACCOUNT NO.:  [id number]   MEDICAL RECORD NO.:  [id number]                   PATIENT TYPE:  INP   LOCATION:  2306                                 FACILITY:  [facility]   PHYSICIAN:  Gilford Raid, M.D.                  DATE OF BIRTH:  [birth year]   DATE OF PROCEDURE:  07/15/2003  DATE OF DISCHARGE:                                 OPERATIVE REPORT   PREOPERATIVE DIAGNOSIS:  Severe single vessel coronary disease.   POSTOPERATIVE DIAGNOSIS:  Severe single vessel coronary disease.   OPERATIVE PROCEDURE:  Off pump coronary bypass graft surgery x 1 using left  internal mammary artery graft to the left anterior descending  coronary  artery.   SURGEON:  Gilford Raid, M.D.   ASSISTANT:  Dorothea Glassman, M.D.   SECOND ASSISTANT:  Tanya Nones, RNFA   ANESTHESIA:  General endotracheal anesthesia.   CLINICAL HISTORY:  This patient is a 75 year old woman with a history of  great vessel occlusive disease who has previously undergone left carotid  endarterectomy for progressive left internal carotid artery stenosis in 1999  by Dr. Amedeo Plenty.  She has been followed for innominate artery occlusive disease  and in November 2004 was noted to have new retrograde flow in the right  vertebral artery and marked disturbed to and fro flow in the right carotid  artery.  She also noted new symptoms of increased dizziness especially with  standing.  A diagnostic arteriogram was performed by Dr. Amedeo Plenty that showed  extensive innominate artery occlusive disease and a stenosis of a right  subclavian artery.  It was felt that she should undergo aortoinnominate  bypass.  Cardiac evaluation included cardiac catheterization that showed a  70-80% calcified eccentric proximal LAD stenosis.  There were insignificant  lesions in the left circumflex and right coronary arteries.  The left  ventricular  ejection fraction was about 60% with no gradient across the  aortic valve and no  mitral regurgitation.  After review of the angiogram and  examination of the patient, it was felt that the best treatment would be to  proceed with coronary artery bypass surgery to the LAD and aortoinnominate  bypass through a median sternotomy incision.  I discussed the operative  procedure of coronary bypass surgery with her including off pump coronary  bypass graft surgery.  We discussed alternatives, benefits, and risks,  including bleeding, blood transfusion, infection, stroke, myocardial  infarction, and death.  She understood and agreed to proceed.   OPERATIVE PROCEDURE:  The patient was taken to the operating room and placed  on the table in supine position.  After induction of general endotracheal  anesthesia, the right neck, chest, abdomen, and both lower extremities were  prepped and draped in the usual sterile manner.  The chest was entered  through a median sternotomy incision and the pericardium opened in the  midline.  Examination of  the heart showed good ventricular contractility.  The ascending aorta had no palpable plaques in it.  Then, the left internal  mammary artery was harvested from the chest wall as a pedicle graft.  This  was a medium caliber vessel with excellent blood flow through it.  The  patient was heparinized with 6000 units of heparin.  The activated clotting  time was about 400 seconds.  The [employer] off pump retractor was placed to  stabilize the mid portion of  the left anterior descending  coronary artery.  The patient tolerated this well.  The retraction tapes were tightened.  The  LAD was opened in its mid portion.  The internal diameter was about 2.5 mm.  The left internal mammary graft was brought through an opening in the  pericardium anterior to the phrenic nerve.  It was anastomosed to the LAD in  an end-to-side manner using a continuous 8-0 Prolene suture.  The pedicle  was tacked to the epicardium with 6-0 Prolene sutures.  The retraction  tapes  were released.  Flow was confirmed down the left internal mammary graft  using a Doppler.  The flow in the LAD was checked with Doppler and there  appeared to be good Doppler flow.  The flow was augmented by opening the  left internal mammary graft.  There was complete hemostasis at the  anastomosis.  Then, the aortoinnominate bypass was performed by Dr. Amedeo Plenty  and this will be dictated separately.  At the conclusion of that procedure,  the heparin was reversed with protamine.  There was complete hemostasis.  Three chest tubes were placed, one in the posterior pericardium, one in the  left pleural space, and one in the anterior mediastinum.  The pericardium  was reapproximated over the heart.  The sternum was closed with #6 stainless  steel wires.  The fascia was closed with continuous #1 Vicryl suture.  The  subcutaneous tissue was closed with continuous 2-0 Vicryl and the skin with  3-0 Vicryl subcuticular closure.  The sponge, needle, and instrument counts  were correct.  Dry, sterile dressings were applied to the incision and  around the chest tubes which were hooked to Pleuravac suction.  The patient  remained hemodynamically stable and was transferred to the SICU in guarded  but stable condition.                                               Gilford Raid, M.D.    BB/MEDQ  D:  07/15/2003  T:  07/15/2003  Job:  [id number]

## 2010-11-03 NOTE — Op Note (Signed)
Cristina Parker                         ACCOUNT NO.:  [id number]   MEDICAL RECORD NO.:  [id number]                   PATIENT TYPE:  INP   LOCATION:                                       FACILITY:  [facility]   PHYSICIAN:  Dorothea Glassman, M.D.                 DATE OF BIRTH:  02-15-1936   DATE OF PROCEDURE:  07/15/2003  DATE OF DISCHARGE:                                 OPERATIVE REPORT   SURGEON:  Dorothea Glassman, M.D.   ASSISTANT:  Gilford Raid, M.D.   ANESTHETIC:  General endotracheal.   ANESTHESIOLOGIST:  Crissie Sickles. Conrad [place], M.D.   PREOPERATIVE DIAGNOSIS:  Innominate artery occlusive disease.   POSTOPERATIVE DIAGNOSIS:  Innominate artery occlusive disease.   PROCEDURE:  Aorto-innominate bypass with 7-mm Hemashield graft.   CLINICAL NOTE:  Cristina Parker is a 75 year old female with known innominate  stenosis.  She has begun to develop symptoms with dizziness, unsteadiness of  gait.  She underwent an arteriogram which revealed evidence of severe,  complex plaque disease at the innominate bifurcation involving the origin of  the right common carotid and right subclavian arteries.  She also underwent  cardiac catheterization which revealed a 70-80% calcified, eccentric  proximal LAD stenosis.   She is brought to the operating room at this time for planned, off-pump left  internal mammary artery to LAD bypass by Dr. Cyndia Bent, aorto-innominate bypass  with 7-mm Gore-Tex carried out by myself.   OPERATIVE PROCEDURE:  The patient was brought to the operating room in  stable condition.  Foley catheter, arterial line and Swan-Ganz catheter in  place.  The chest and neck were prepped and draped in a sterile fashion.   Dr. Cyndia Bent then completed the sternotomy and coronary artery bypass grafting  x1 with left internal mammary artery to LAD off-pump.  This is dictated  under separate heading.   At completion of the LIMA to LAD procedure, the aorto-innominate bypass was  carried out.  The  midline sternotomy incision was extended along the right  neck.  Subcutaneous tissue divided with electrocautery.  The sternal head of  the sternomastoid muscle was divided.  The innominate vein was skeletonized.  The ascending aorta was cleared of adventitia.  The origin of the innominate  artery identified.  The innominate artery then dissected distally up to its  bifurcation into common carotid and right subclavian origins.  The vagus  nerve and recurrent laryngeal nerve were reflected laterally and preserved.  The origin of the right subclavian artery was encircled with the vessel  loops.  The proximal right common carotid artery was dissected distally and  encircled with the vessel loops.  There was plaque at the origin of the  right subclavian artery and plaque at the origin of the innominate artery.   The patient had previously received 6000 units of heparin intravenously, was  then bolused with an additional 2000 units  of heparin intravenously.  A  partial-occlusion clamp was then placed on the ascending aorta.  Longitudinal arteriotomy made.  A 7-mm Hemashield was anastomosed end-to-  side to the ascending aorta with running 3-0 Prolene suture.  At completion  of the proximal anastomosis, partial-occlusion clamp released, the graft  flushed and controlled with a Fogarty clamp.   The graft was then brought anterior to the innominate vein.  The innominate,  right subclavian and common carotid arteries were controlled with clamps.  A  longitudinal arteriotomy was made in the right common carotid artery and  extended down into the innominate origin.   The graft was beveled.  The graft was then anastomosed to the distal  innominate extending out onto the origin of the right common carotid in an  end-to-side fashion with running 5-0 Prolene suture.  At the completion of  the distal anastomosis, each vessel was flushed along with the graft.  Clamps were the removed and directed the  initial antegrade flow down the  right subclavian artery.   The patient was now administered 50 mg of protamine intravenously.  Adequate  hemostasis was obtained.  Sponge and instrument counts correct.   Dr. Cyndia Bent then completed closure of the chest, dictated under separate  heading.  There were no apparent complications, the patient was transferred  to the surgical intensive care unit in stable condition.                                               Dorothea Glassman, M.D.    PGH/MEDQ  D:  07/15/2003  T:  07/16/2003  Job:  [id number]   cc:   Dola Argyle, M.D.   Nelda Severe Juventino Slovak, M.D.  [address] [place]  [place] [address]  Fax: [phone]

## 2010-11-03 NOTE — Op Note (Signed)
Cristina Parker, Cristina Parker                         ACCOUNT NO.:  [id number]   MEDICAL RECORD NO.:  [id number]                   PATIENT TYPE:  OIB   LOCATION:  2869                                 FACILITY:  [facility]   PHYSICIAN:  Jessy Oto. Fields, MD               DATE OF BIRTH:  1935-08-10   DATE OF PROCEDURE:  05/18/2003  DATE OF DISCHARGE:                                 OPERATIVE REPORT   PROCEDURE:  Arch aortogram.   PREOPERATIVE DIAGNOSIS:  Symptomatic aortic arch disease.   POSTOPERATIVE DIAGNOSIS:  Symptomatic aortic arch disease.   ASSISTANT:  Dorothea Glassman, M.D.   ANESTHESIA:  Local.   INDICATIONS:  The patient is a 75 year old female with known innominate  artery stenosis.  She had a recent duplex ultrasound which suggested that  the stenosis has become worse.  She has had symptoms of dizziness.   OPERATIVE DETAILS:  After obtaining informed consent, the patient was  brought to the angiography suite.  The patient was placed in the supine  position on the angiography table. Next both groins were prepped and draped  in the usual sterile fashion.  Local anesthesia was infiltrated over the  right common femoral artery.  Next the right common femoral artery was  cannulated with a Majestic needle.  A 0.035 Wholey wire was then threaded  through the introducer needle using the modified Seldinger technique.  The  needle was removed and a 5 [place] short sheath was then placed over the  guidewire into the right common femoral artery.  This was all done under  fluoroscopic guidance.  Next the [facility] wire was advanced into the abdominal  aorta and a 5 French pigtail catheter was threaded over the [facility] wire, and  both of these were advanced as a unit into the ascending aorta.  An arch  aortogram was then performed.  This was done in multiple views with multiple  injections.  There is a high-grade stenosis of the innominate artery at its  origin.  The innominate artery comes off in  normal position off the arch as  well as the left common carotid artery and the left subclavian artery.  The  stenosis in the innominate artery can best be seen on image #4.  There is a  high-grade severe stenosis of the innominate artery which is heavily  calcified at its origin.  The left subclavian artery also has a stenosis  near its origin which is approximately 50%.  This is also heavily calcified.  The right subclavian artery fills by retrograde flow from the right  vertebral artery.  Both vertebral arteries are quite large in caliber.  The  left and right internal mammary arteries are patent.  There is mild disease  of the left carotid bifurcation.  Lateral and oblique views were then  performed with the pigtail catheter in the arch to further define the  anatomy of the right  carotid bifurcation.  This view is best seen on image  #6, series 1.  There is minimal disease of the right carotid bifurcation.  However, the distal common carotid, internal carotid, and external carotid  arteries are small in caliber.  Finally an ascending aortogram was obtained,  which shows that there is minimal disease in the ascending aorta.  Next the  guidewire was advanced back up through the pigtail catheter to straighten  it, and the straightened catheter and guidewire were then removed as a unit  through the sheath.  The 5 French sheath was then removed and hemostasis was  attained with direct pressure.  There were no complications during the  procedure.  The patient was taken to the recovery room in stable condition.   FINDINGS:  1. Severe stenosis of the innominate origin.  2. Severe stenosis of the right subclavian artery with retrograde filling     from the right vertebral artery.  3. A 50% stenosis of the left subclavian artery.  4. Bilateral widely patent vertebral arteries.  5. Minimal carotid bifurcation disease on the right and left.                                                Jessy Oto. Fields, MD    CEF/MEDQ  D:  05/18/2003  T:  05/18/2003  Job:  [id number]

## 2010-11-03 NOTE — Discharge Summary (Signed)
NAMETEDDI, BADALAMENTI                         ACCOUNT NO.:  [id number]   MEDICAL RECORD NO.:  [id number]                   PATIENT TYPE:  INP   LOCATION:  2025                                 FACILITY:  [facility]   PHYSICIAN:  Gilford Raid, M.D.                  DATE OF BIRTH:  [birth year]   DATE OF ADMISSION:  07/15/2003  DATE OF DISCHARGE:  07/19/2003                                 DISCHARGE SUMMARY   ADDENDUM:  Ms. Eutsler was scheduled for discharge home on Monday, July 19, 2003.  On morning rounds, she was noted to have a one to three-minute  episode of bigeminy and trigeminy.  Her blood pressure was also noted to be  elevated with a systolic reading of 959.  Based on these findings, morning  laboratories were done and her Lopressor was increased from 12.5 mg to 25 mg  twice a day.  Later that afternoon, her laboratories showed a normal  magnesium level of 1.9.  Her sodium was 140, BUN 11, creatinine 0.6, and  blood glucose 97.  Her potassium was slightly decreased at 3.4.  Based on  this, she was given 40 mEq of potassium chloride.  Her blood pressure and  heart rate was also noted to tolerate the increase her beta blocker.  Afternoon vitals showed a blood pressure of 128/60 and a heart rate of 88,  which was in heart rate.  No further episodes of bigeminy or trigeminy were  noted.  Based on this, we did proceed with plans to discharge her home.  The  patient was discharged home with family on July 19, 2003.   Her discharge medications, which reflect a change from her previously  dictated discharge summary, are as follows:  1. Enteric-coated aspirin 81 mg one p.o. daily.  2. Toprol XL 50 mg one p.o. daily.  3. Asacol 800 mg b.i.d.  4. Prinizide 10/12.5 mg daily.  5. Nexium 40 mg one p.o. daily.  6. Xalatan 0.05% eyedrops daily.  7. Citrucel daily.  8. Lexapro 10 mg one p.o. daily.  9. Tylox one to two tablets p.o. q.4h. p.r.n. pain.   All other medication instructions  and discharge followup orders are  unchanged from the previous dictation.      Jacinta Shoe, P.A.                  Gilford Raid, M.D.    AWZ/MEDQ  D:  07/19/2003  T:  07/19/2003  Job:  [id number]   cc:   Nelda Severe. Juventino Slovak, M.D.  [address], [place]  [place]  [place] [address]  Fax: [phone]   Dola Argyle, M.D.

## 2010-11-03 NOTE — Discharge Summary (Signed)
Cristina Parker, Cristina Parker                         ACCOUNT NO.:  [id number]   MEDICAL RECORD NO.:  [id number]                   PATIENT TYPE:  INP   LOCATION:  2306                                 FACILITY:  [facility]   PHYSICIAN:  Gilford Raid, M.D.                  DATE OF BIRTH:  [birth year]   DATE OF ADMISSION:  07/15/2003  DATE OF DISCHARGE:  07/19/2003                                 DISCHARGE SUMMARY   ADMISSION DIAGNOSES:  1. Severe single-vessel coronary artery disease.  2. Extensive innominate origin occlusive disease.   ADDITIONAL AND DISCHARGE DIAGNOSES:  1. Severe single-vessel coronary artery disease.  2. Extensive innominate origin occlusive disease.  3. Status post left carotid endarterectomy in 1999.  4. Status post cervical spine surgery by Dr. Carloyn Manner about two years ago.  5. Ulcerative colitis.  6. Hypertension.  7. Gastroesophageal reflux disease.  8. Osteoporosis and degenerative joint disease.   PROCEDURES:  1. Off-pump coronary artery bypass grafting x 1 (left internal mammary     artery to the LAD).  2. Aorta-to-innominate-artery bypass with 7 mm Hemashield graft.   HISTORY:  The patient is a 75 year old white female with a history of a left  carotid endarterectomy performed by Dr. Amedeo Plenty in 1999. She has been followed  for innominate artery occlusive disease and in November 2004 was noted to  have new retrograde flow in the right vertebral artery and markedly  disturbed to and fro flow in the right carotid artery. She also had new  symptoms of increased dizziness, especially with standing.  She underwent a  diagnostic arteriogram by Dr. Amedeo Plenty which showed extensive innominate origin  occlusive disease and stenosis of the right subclavian artery with moderate  left subclavian stenosis estimated to be about 50%.  It was felt that she  should undergo an aorta-innominate bypass.   Because of her Past Medical History, a cardiology evaluation was obtained  for cardiac  clearance.  She underwent catheterization which showed 70 to 80%  calcified eccentric proximal LAD stenosis.  There were insignificant lesions  in the left circumflex and a normal right coronary artery.  Left ventricular  ejection fraction was 60% with no gradient across the aortic valve and no  mitral regurgitation.  Because of her single-vessel disease, she was  referred to Dr. Gilford Raid for evaluation.  He agreed that CABG was  indicated and felt that this could be performed as an off-pump bypass in  coordination with her innominate artery bypass.  The patient consented to  proceed.   HOSPITAL COURSE:  She was admitted to [facility] on July 15, 2003, and was taken to the operating room where she underwent off-pump CABG  x 1 as well as aorta-innominate bypass.  She tolerated the procedures well  and was transferred to the SICU in stable condition.  She initially was  maintained on Neo-Synephrine drip, but this was  ultimately weaned and  discontinued over the first 24 hours.  She was still somewhat hypotensive  off the Neo-Synephrine and was noted to have a hematocrit of 25.7 with  hemoglobin of 8.7.  She was transfused a unit of packed red blood cells.  This brought her hemoglobin and hematocrit back up to 9.2 and 27.3,  respectively.  Also, her blood pressure improved to the 295 systolic range.  She was extubated immediately after surgery.   She was otherwise stable and remained in the unit for observation.  By late  in the day of postop day #2, she was felt to be ready for transfer to the  floor.  However, no beds were available at that time, and she remained in  the ICU.  She was started on low-dose beta blocker and was mobilized with  cardiac rehab phase I.  Postoperatively, she has done very well.  She  remains on the unit while awaiting transfer to the 2000 unit.  She is  afebrile, and all vital signs have been stable.  She is maintaining normal  sinus rhythm.   She is ambulating without difficulty and has been weaned from  supplemental oxygen.  Her surgical incision sites are all healing well.  Her  labs have remained stable with hemoglobin and hematocrit as mentioned above,  platelets 137, white count 9.1.  Potassium 3.6 which has been supplemented.  BUN 12, creatinine 0.8.  Hopefully, she will be transferred to the floor  this evening.  If she remains stable over the next 24 hours, she will be  ready for discharge home on July 19, 2003.   DISCHARGE MEDICATIONS:  1. Enteric-coated aspirin 325 mg daily.  2. Toprol XL 25 mg daily.  3. Asacol 800 mg b.i.d.  4. Prinizide 10/12.5 daily.  5. Nexium 40 mg daily.  6. Xalatan 0.05% drops daily.  7. Citrucel daily.  8. Lexapro 10 mg daily.  9. Tylox 1 to 2 q.4h. p.r.n. pain.   DISCHARGE INSTRUCTIONS:  1. She is to refrain from driving, heavy lifting, or strenuous activity.  2. She may continued walking daily and using incentive spirometer.  3. She is asked to shower daily and clean her incisions with soap and water.   FOLLOW UP:  She is asked to make an appointment to see Dr, Ron Parker in two weeks  and have a chest x-ray at that visit. She will follow up with Dr. Cyndia Bent in  the office in three weeks and is asked to bring her chest x-ray.  Discharge  followup will also be arranged with Dr. Amedeo Plenty after she has been seen by Dr.  Cyndia Bent.  She is asked to call our office in the interim if she experiences  any problems or has questions.      Suzzanne Cloud, P.A.                        Gilford Raid, M.D.    GC/MEDQ  D:  07/18/2003  T:  07/18/2003  Job:  [id number]   cc:   Nelda Severe. Juventino Slovak, M.D.  [address] [place]  [place]  [place] [address]  Fax: [phone]   Dola Argyle, M.D.

## 2010-11-03 NOTE — Telephone Encounter (Signed)
Message copied by Shella Maxim on Fri Nov 03, 2010  4:40 PM ------      Message from: Sharlett Iles, DAVID      Created: Fri Nov 03, 2010  2:26 PM       Continue iron

## 2010-11-03 NOTE — Op Note (Signed)
[place]  Patient:    Cristina Parker, Cristina Parker Visit Number: [id number] MRN: [id number]          Service Type: SUR Location: 5825 [id number] Attending Physician:  Melton Krebs Dictated by:   Elizabeth Sauer, M.D. Proc. Date: 08/12/01 Admit Date:  08/12/2001 Discharge Date: 08/13/2001                             Operative Report  PREOPERATIVE DIAGNOSIS:  Cervical spondylosis C3-4, C4-5, and C5-6.  POSTOPERATIVE DIAGNOSIS:  Cervical spondylosis C3-4, C4-5, and C5-6.  OPERATIVE PROCEDURE:  C3-4, C4-5, and C5-6 anterior cervical diskectomy and fusion with a tether plate done from the right side.  SURGEON:  Elizabeth Sauer, M.D.  ANESTHESIA:  General endotracheal.  PREP:  Shave and prepped, scrubbed with alcohol wipe.  COMPLICATIONS:  None.  DESCRIPTION OF PROCEDURE:  This 75 year old right-handed, white lady with severe cervical spondylosis.  She has had a prior carotid endarterectomy on the left, so the operation was done from the right side.  She was taken to the operating room, smoothly anesthetized, intubated and placed in the halter head traction with slight neck extension.  Following shave, prepped and draped in usual sterile fashion.  The skin was incised paralleling the sternocleidomastoid muscle on the left side starting one fingerbreadth below the carotid tubercle extending approximately 9 cm.  The platysma was identified, elevated, divided and undermined.  The sternocleidomastoid was identified, medial dissection revealed the carotid artery, trachea, and esophagus.  The carotid was retracted laterally to the right, trachea and esophagus were retracted laterally to the left thus exposing the bones of the anterior cervical spine.  Marker was placed and intraoperative x-ray obtained to confirm correctness level.  Having confirmed correctness level, the longus colli was taken down bilaterally.  Diskectomy was carried out at 3-4, 4-5, and 5-6  under gross observation.  The operating microscope was then brought in and using microdissection technique, the remaining disk was removed and the anterior epidural space dissected at each level.  The neural foramen were carefully explored and with the disk space spreader in place, they were wide open.  Having completed the dissection at each level and found the slip at 3-4, just bad degenerative disease at each other level, and having completely decompressed the nerve roots, 7 mm bone grafts were fashioned from patellar allograft and tapped in at each level.  A tether was then placed with two screws in C3, one in C4, and one in C5, and two in C6. The wound was irrigated and hemostasis was assured.  Intraoperative x-ray showed good placement of bone grafts, plate and screws.  The esophagus was carefully inspected and found to have no defects.  The platysma was reapproximated with 3-0 Vicryl in an interrupted fashion, subcutaneous tissues were reapproximated with 3-0 Vicryl in an interrupted fashion, and the skin was closed with 4-0 Vicryl in a running subcuticular fashion.  Benzoin and Steri-Strips were placed and occlusive with Telfa and OpSite.  The patient was then returned to the recovery room in good condition. Dictated by:   Elizabeth Sauer, M.D. Attending Physician:  Melton Krebs DD:  08/12/01 TD:  08/12/01 Job: [id number]

## 2010-11-03 NOTE — H&P (Signed)
[place]. [facility]  Patient:    Cristina Parker, Cristina Parker Visit Number: [id number] MRN: [id number]          Service Type: SUR Location: 3875 [id number] [id number] Attending Physician:  Melton Krebs Dictated by:   Elizabeth Sauer, M.D. Admit Date:  08/12/2001 Discharge Date: 08/13/2001                           History and Physical  ADMITTING DIAGNOSIS: Cervical spondylosis with early myelopathy.  HISTORY OF PRESENT ILLNESS: This is a 75 year old left-handed white lady, who for a couple of years has had increasing numbness in the radial three digits of the right hand and increasing discomfort in her neck over the past couple of years.  With rapid motion of her hand she loses feeling in her fingers.  A local doctor obtained plain films that demonstrated spondylitic change in her neck, and referred her to me.  MRI has subsequently shown that she has severe spinal stenosis at C3-4, C4-5, and C5-6, and she is admitted now for anterior cervical diskectomy and fusion at those levels.  PAST MEDICAL HISTORY:  1. Coronary disease artery disease.  2. Diffuse vascular disease.  4. Right subclavian stenosis.  5. Has had carotid endarterectomy on the left side.  6. Crohns disease.  7. Hypertension.  MEDICATIONS:  1. Prinzide 10/12.5 mg q.d.  2. Fosamax 70 mg once a week.  3. Nexium 40 mg q.d.  4. Asacol 1200 mg b.i.d.  5. Xalatan eye drops 0.005% for glaucoma.  6. Calcium q.d.  7. Multivitamin q.d.  8. Vitamin C q.d.  9. Glucosamine q.d. 10. Ecotrin q.d.  ALLERGIES: She is sensitive to, but not allergic to DARVOCET.  PAST SURGICAL HISTORY:  1. Carotid endarterectomy.  2. Two cesarean sections.  3. Bunionectomy.  4. Hysterectomy.  5. Duodenal dilation in 1995, 1999, and 2002.  6. History of thoracic compression fractures years ago, looked after by     Dr. Rolin Barry.  SOCIAL HISTORY: Does not smoke anymore, but had smoked for 30 years and quit four years ago.  She drinks  beer sparingly on a social basis, and is a retired Marine scientist.  FAMILY HISTORY: Not given.  REVIEW OF SYSTEMS: Remarkable for neck pain and hand numbness.  PHYSICAL EXAMINATION:  HEENT: Within normal limits.  NECK: She has limitation of range of motion of her neck and when she turns it has discomfort.  CHEST: Diffuse wheezes but clear.  CARDIAC: Regular rate and rhythm but somewhat tachycardic.  No murmur.  ABDOMEN: Nontender.  No hepatosplenomegaly.  EXTREMITIES: Without clubbing or cyanosis.  Peripheral pulses good proximally, slightly diminished in the dorsalis pedis bilaterally.  GU: Examination deferred.  NEUROLOGIC: She is awake and alert and oriented.  Cranial nerves intact.  She does have some strabismus of the right eye, but does not complain of diplopia. Facial movement intact.  She does not describe swallowing difficulties.  On motor examination she has 5/5 strength throughout the upper extremities. Sensory deficit described in the right median nerve distribution.  Reflexes are 1 throughout the right upper extremity, 2-2+ throughout the left upper extremity.  Hoffmanns was initially negative and on second visit was positive.  Knee jerk is 3+ on the right and the right is 2.  There is no clonus.  Toes are upgoing bilaterally.  LABORATORY DATA: MRI results have been reviewed above.  CLINICAL IMPRESSION: Cervical spondylosis with early myelopathy.  PLAN: The plan is  for anterior cervical diskectomy and fusion at C3-4, C4-5, and C5-C6.  The risks and benefits of this approach have been discussed with her, and she wishes to proceed. Dictated by:   Elizabeth Sauer, M.D. Attending Physician:  Melton Krebs DD:  08/12/01 TD:  08/12/01 Job: [id number]

## 2010-11-03 NOTE — Patient Instructions (Signed)
Taper your Entocort as directed.  Please go to the basement today for your labs.

## 2010-11-03 NOTE — Telephone Encounter (Signed)
Message copied by Shella Maxim on Fri Nov 03, 2010  4:38 PM ------      Message from: Sharlett Iles, DAVID      Created: Fri Nov 03, 2010  2:26 PM       Continue iron

## 2010-11-03 NOTE — Discharge Summary (Signed)
NAMERHENDA, [place]                         ACCOUNT NO.:  [id number]   MEDICAL RECORD NO.:  [id number]                   PATIENT TYPE:  INP   LOCATION:  2306                                 FACILITY:  [facility]   PHYSICIAN:  Gilford Raid, M.D.                  DATE OF BIRTH:  Feb 11, 1936   DATE OF ADMISSION:  07/15/2003  DATE OF DISCHARGE:  07/18/2003                                 DISCHARGE SUMMARY   ADDENDUM:  Cristina Parker is scheduled for discharge home on July 19, 2003.  Previously dictated discharge summary stated that she will be sent home on  enteric-coated aspirin 325 mg daily; however, she will actually be sent home  on Plavix 75 mg daily as well as enteric-coated aspirin 81 mg daily.  Please  note this correction.  All other medications, instructions, and discharge  follow-up orders are unchanged from the previous dictation.      Suzzanne Cloud, P.A.                        Gilford Raid, M.D.    GC/MEDQ  D:  07/18/2003  T:  07/18/2003  Job:  [id number]

## 2011-02-13 ENCOUNTER — Ambulatory Visit (INDEPENDENT_AMBULATORY_CARE_PROVIDER_SITE_OTHER): Payer: Medicare Other | Admitting: Vascular Surgery

## 2011-02-13 ENCOUNTER — Other Ambulatory Visit: Payer: Self-pay | Admitting: Vascular Surgery

## 2011-02-13 ENCOUNTER — Encounter: Payer: Self-pay | Admitting: Vascular Surgery

## 2011-02-13 ENCOUNTER — Ambulatory Visit (INDEPENDENT_AMBULATORY_CARE_PROVIDER_SITE_OTHER): Payer: Medicare Other

## 2011-02-13 VITALS — BP 145/78 | HR 68 | Ht 60.0 in | Wt 118.0 lb

## 2011-02-13 DIAGNOSIS — Z48812 Encounter for surgical aftercare following surgery on the circulatory system: Secondary | ICD-10-CM

## 2011-02-13 DIAGNOSIS — I6529 Occlusion and stenosis of unspecified carotid artery: Secondary | ICD-10-CM

## 2011-02-13 LAB — BUN: BUN: 23 mg/dL (ref 6–23)

## 2011-02-13 LAB — CREATININE, SERUM: Creat: 0.75 mg/dL (ref 0.50–1.10)

## 2011-02-13 NOTE — Progress Notes (Signed)
Subjective:     Patient ID: Cristina Parker, female   DOB: Aug 17, 1935, 75 y.o.   MRN: [id number]  HPI The patient is a 75 year old female with an extensive past history of extracranial cerebrovascular vascular occlusive disease. She had been lost to followup. She denies any new symptoms of amaurosis fugax transient ischemic attack or stroke. She is status post left carotid endarterectomy in 1999. She is status post coronary bypass grafting and arch to innominate bypass grafting in 2005. On last duplex followup 2 years ago she had moderate to severe right internal carotid artery stenosis. He does have a history of hypertension. She has also undergone cataract surgery hip pinning and hysterectomy in the past  Review of Systems No weight loss or gain. Occasional blurred vision felt to be related to cataract surgery. Cardiac spots of shortness of breath with exertion heart murmur. GI positive for reflux. Musculoskeletal arthritis and joint pain. Psychiatric positive for anxiety. Review of systems otherwise negative    Past Medical History  Diagnosis Date  . Heart disease   . Crohn's disease   . Duodenal stricture   . Esophageal reflux   . Hypertension   . Hyperlipemia   . Hiatal hernia   . Pneumonia   . Anemia   . Depression   . Collagenous colitis   . Anxiety     History  Substance Use Topics  . Smoking status: Former Smoker    Types: Cigarettes    Quit date: 06/18/1996  . Smokeless tobacco: Never Used  . Alcohol Use: 3.5 - 7.0 oz/week    7-14 drink(s) per week    Family History  Problem Relation Age of Onset  . Colon polyps Father   . Heart disease Father   . Stroke Father     No Known Allergies  Current outpatient prescriptions:acetaminophen (TYLENOL) 325 MG tablet, Take 325 mg by mouth as needed.  , Disp: , Rfl: ;  Acetaminophen-Caffeine (EXCEDRIN TENSION HEADACHE) 500-65 MG TABS, Take 2 tablets by mouth daily.  , Disp: , Rfl: ;  Ascorbic Acid (VITAMIN C) 1000 MG tablet,  Take 1,000 mg by mouth daily.  , Disp: , Rfl: ;  aspirin 81 MG tablet, Take 81 mg by mouth daily.  , Disp: , Rfl:  atorvastatin (LIPITOR) 40 MG tablet, Take 40 mg by mouth daily.  , Disp: , Rfl: ;  Calcium Citrate-Vitamin D 250-200 MG-UNIT TABS, Take 2 tablets by mouth daily.  , Disp: , Rfl: ;  escitalopram (LEXAPRO) 20 MG tablet, Take 20 mg by mouth daily.  , Disp: , Rfl: ;  HYDROcodone-acetaminophen (VICODIN) 5-500 MG per tablet, Take 1-3 tablets by mouth as needed.  , Disp: , Rfl:  lisinopril-hydrochlorothiazide (PRINZIDE,ZESTORETIC) 10-12.5 MG per tablet, Take 1 tablet by mouth daily.  , Disp: , Rfl: ;  metoprolol (TOPROL-XL) 50 MG 24 hr tablet, Take 50 mg by mouth daily.  , Disp: , Rfl: ;  pantoprazole (PROTONIX) 40 MG tablet, Take 40 mg by mouth daily.  , Disp: , Rfl: ;  budesonide (ENTOCORT EC) 3 MG 24 hr capsule, Taper as directed., Disp: 90 capsule, Rfl: 0 cetirizine (ZYRTEC) 5 MG tablet, Take 5 mg by mouth daily.  , Disp: , Rfl: ;  clopidogrel (PLAVIX) 75 MG tablet, Take 75 mg by mouth daily.  , Disp: , Rfl:   BP 145/78  Pulse 68  Ht 5' (1.524 m)  Wt 118 lb (53.524 kg)  BMI 23.05 kg/m2  SpO2 99%  Body mass index is  23.05 kg/(m^2).       Objective:   Physical Exam Well-developed well-nourished white female in no acute distress. Grossly intact neurologically. Heart regular rate and rhythm without murmur. Chest clear bilaterally. Radial pulses 2+ bilaterally. Partial right carotid bruit. No bruits the left carotid with well-healed incision. Well-healed median sternotomy incision. Abdomen soft with no masses. Musculoskeletal no gross deformity.  Carotid duplex: Severe stenosis in the right internal carotid artery. Normal brachial pressures bilaterally.    Assessment:     Severe asymptomatic right internal carotid artery stenosis with a proximal aortic to innominate artery bypass.    Plan:     Have recommended a CT angiogram of her chest and neck for further evaluation. I feel she  needs evaluation of the arch innominate bypass or 7 years ago prior to right carotid endarterectomy. She is also not had cardiac followup since her bypass in 2005 and therefore we'll refer her for a preoperative evaluation by Dr.Katz

## 2011-02-14 ENCOUNTER — Other Ambulatory Visit: Payer: Self-pay | Admitting: Vascular Surgery

## 2011-02-14 DIAGNOSIS — I6529 Occlusion and stenosis of unspecified carotid artery: Secondary | ICD-10-CM

## 2011-02-20 ENCOUNTER — Other Ambulatory Visit: Payer: Self-pay | Admitting: Vascular Surgery

## 2011-02-20 ENCOUNTER — Encounter: Payer: Self-pay | Admitting: Cardiovascular Disease

## 2011-02-20 ENCOUNTER — Ambulatory Visit
Admission: RE | Admit: 2011-02-20 | Discharge: 2011-02-20 | Disposition: A | Discharge status: Home / Self Care | Payer: Medicare Other | Source: Ambulatory Visit | Attending: Vascular Surgery | Admitting: Vascular Surgery

## 2011-02-20 DIAGNOSIS — I6529 Occlusion and stenosis of unspecified carotid artery: Secondary | ICD-10-CM

## 2011-02-20 MED ORDER — IOHEXOL 350 MG/ML SOLN
75.0000 mL | Freq: Once | INTRAVENOUS | Status: AC | PRN
  Administered 2011-02-20: 75 mL via INTRAVENOUS

## 2011-02-20 NOTE — Procedures (Unsigned)
CAROTID DUPLEX EXAM  INDICATION:  Carotid disease.  HISTORY: Diabetes:  No. Cardiac:  No. Hypertension:  Yes. Smoking:  Previous. Previous Surgery:  Aorta to innominate artery bypass graft on 07/15/2003; left carotid endarterectomy in 1999. CV History:  Patient states she has mild visual disturbances. Amaurosis Fugax No, Paresthesias No, Hemiparesis No.                                      RIGHT             LEFT Brachial systolic pressure:         144               132 Brachial Doppler waveforms:         Normal            Normal Vertebral direction of flow:        Antegrade         Antegrade DUPLEX VELOCITIES (cm/sec) CCA peak systolic                   89                30 ECA peak systolic                   414               63 ICA peak systolic                   470               37 ICA end diastolic                   150               16 PLAQUE MORPHOLOGY:                  Mixed/calcific    Heterogenous PLAQUE AMOUNT:                      Severe            Mild PLAQUE LOCATION:                    ICA/ECA/CCA       ICA/CCA  IMPRESSION: 1. Doppler velocities suggest 80% to 99% stenosis of the right     internal carotid artery. 2. Patent left carotid endarterectomy site with no hemodynamically     significant stenosis of the left internal carotid artery. 3. Significant increase in velocities of the right internal carotid     artery when compared to the previous examination on 06/03/2009.     The left internal carotid artery appears to be stable.  ___________________________________________ Rosetta Posner, M.D.  CH/MEDQ  D:  02/14/2011  T:  02/14/2011  Job:  [id number]

## 2011-02-23 ENCOUNTER — Encounter: Payer: Self-pay | Admitting: Cardiovascular Disease

## 2011-02-23 ENCOUNTER — Ambulatory Visit (INDEPENDENT_AMBULATORY_CARE_PROVIDER_SITE_OTHER): Payer: Medicare Other | Admitting: Cardiovascular Disease

## 2011-02-23 DIAGNOSIS — R011 Cardiac murmur, unspecified: Secondary | ICD-10-CM

## 2011-02-23 DIAGNOSIS — I10 Essential (primary) hypertension: Secondary | ICD-10-CM

## 2011-02-23 DIAGNOSIS — I739 Peripheral vascular disease, unspecified: Secondary | ICD-10-CM

## 2011-02-23 DIAGNOSIS — I2510 Atherosclerotic heart disease of native coronary artery without angina pectoris: Secondary | ICD-10-CM

## 2011-02-23 NOTE — Progress Notes (Addendum)
75 yo vasculopath.  Previously seen by Dr Ron Parker.  CAD with [place] to LAD in 2005.  This was a combined procedure with Dr Amedeo Plenty who did an innominate bypass for severe arch disease.  Previously had LCEA in 1999.  Lost to F/U.  Had duplex 8/12 at [facility] office that I reviewed.  Systolic velocity over 4cm/sec and greater than 90% stenosis in RICA.  No angina, mild exertional dyspnea.  Ambulation limited by hip pain not claudication.  Compliant with meds.    ROS: Denies fever, malais, weight loss, blurry vision, decreased visual acuity, cough, sputum, SOB, hemoptysis, pleuritic pain, palpitaitons, heartburn, abdominal pain, melena, lower extremity edema, claudication, or rash.  All other systems reviewed and negative   General: Affect appropriate Chronically ill HEENT: normal Neck supple with no adenopathy JVP normal bilteral bruits with left CEA  no thyromegaly Bilateral subclavian bruits with median sternotomy Lungs clear with no wheezing and good diaphragmatic motion Heart:  S1/S2 AS murmur no ,rub, gallop or click PMI normal Abdomen: benighn, BS positve, no tenderness, no AAA no bruit.  No HSM or HJR Distal pulses intact with no bruits No edema Neuro non-focal Skin warm and dry No muscular weakness  Medications Current Outpatient Prescriptions  Medication Sig Dispense Refill  . acetaminophen (TYLENOL) 325 MG tablet Take 325 mg by mouth as needed.        . Acetaminophen-Caffeine (EXCEDRIN TENSION HEADACHE) 500-65 MG TABS Take 2 tablets by mouth daily.        . Ascorbic Acid (VITAMIN C) 1000 MG tablet Take 1,000 mg by mouth daily.        Marland Kitchen aspirin 81 MG tablet Take 81 mg by mouth daily.        Marland Kitchen atorvastatin (LIPITOR) 40 MG tablet Take 40 mg by mouth daily.        . Calcium Citrate-Vitamin D 250-200 MG-UNIT TABS Take 2 tablets by mouth daily.        . clopidogrel (PLAVIX) 75 MG tablet Take 75 mg by mouth daily.        Marland Kitchen escitalopram (LEXAPRO) 20 MG tablet Take 20 mg by mouth daily.         Marland Kitchen HYDROcodone-acetaminophen (VICODIN) 5-500 MG per tablet Take 1-3 tablets by mouth as needed.        Marland Kitchen lisinopril-hydrochlorothiazide (PRINZIDE,ZESTORETIC) 10-12.5 MG per tablet Take 1 tablet by mouth daily.        . metoprolol (TOPROL-XL) 100 MG 24 hr tablet 1/2 tab po qd       . pantoprazole (PROTONIX) 40 MG tablet Take 40 mg by mouth daily.          Allergies Review of patient's allergies indicates no known allergies.  Family History: Family History  Problem Relation Age of Onset  . Colon polyps Father   . Heart disease Father   . Stroke Father   . Colon cancer Neg Hx     Social History: History   Social History  . Marital Status: Widowed    Spouse Name: N/A    Number of Children: 2  . Years of Education: N/A   Occupational History  . retired     Therapist, sports  .     Social History Main Topics  . Smoking status: Former Smoker    Types: Cigarettes    Quit date: 06/18/1996  . Smokeless tobacco: Never Used  . Alcohol Use: 3.5 - 7.0 oz/week    7-14 drink(s) per week  . Drug Use: No  .  Sexually Active: Not on file   Other Topics Concern  . Not on file   Social History Narrative  . No narrative on file    Electrocardiogram:  SR 64 artifact baseline otherwise normal  Assessment and Plan

## 2011-02-23 NOTE — Assessment & Plan Note (Signed)
Well controlled.  Continue current medications and low sodium Dash type diet.   I suspect central aortic pressure is higher

## 2011-02-23 NOTE — Patient Instructions (Signed)
Your physician has requested that you have a lexiscan myoview. For further information please visit [email or web address]. Please follow instruction sheet, as given.   Your physician has requested that you have an echocardiogram. Echocardiography is a painless test that uses sound waves to create images of your heart. It provides your doctor with information about the size and shape of your heart and how well your heart's chambers and valves are working. This procedure takes approximately one hour. There are no restrictions for this procedure.

## 2011-02-23 NOTE — Assessment & Plan Note (Signed)
I suspect she will be cleared for RCEA.  Needs lexiscan myovue to assess CAD.  Only had single vessel [place] to LAD and need to R/O progression of disease in RCA/circ.  Also has SEM of mild AS that should be evaluated by echo.

## 2011-02-27 ENCOUNTER — Ambulatory Visit: Payer: Medicare Other | Admitting: Vascular Surgery

## 2011-03-06 ENCOUNTER — Encounter: Payer: Self-pay | Admitting: *Deleted

## 2011-03-13 ENCOUNTER — Ambulatory Visit (HOSPITAL_BASED_OUTPATIENT_CLINIC_OR_DEPARTMENT_OTHER): Payer: Medicare Other | Admitting: Radiology

## 2011-03-13 ENCOUNTER — Ambulatory Visit (HOSPITAL_COMMUNITY): Payer: Medicare Other | Attending: Cardiovascular Disease | Admitting: Radiology

## 2011-03-13 DIAGNOSIS — R011 Cardiac murmur, unspecified: Secondary | ICD-10-CM

## 2011-03-13 DIAGNOSIS — I25810 Atherosclerosis of coronary artery bypass graft(s) without angina pectoris: Secondary | ICD-10-CM

## 2011-03-13 DIAGNOSIS — I2510 Atherosclerotic heart disease of native coronary artery without angina pectoris: Secondary | ICD-10-CM

## 2011-03-13 DIAGNOSIS — R0609 Other forms of dyspnea: Secondary | ICD-10-CM

## 2011-03-13 LAB — COMPREHENSIVE METABOLIC PANEL
Albumin: 2.6 — ABNORMAL LOW
Alkaline Phosphatase: 63
BUN: 5 — ABNORMAL LOW
Calcium: 8 — ABNORMAL LOW
Creatinine, Ser: 0.67
Glucose, Bld: 100 — ABNORMAL HIGH
Potassium: 3.8
Total Protein: 5.6 — ABNORMAL LOW

## 2011-03-13 LAB — URINALYSIS, ROUTINE W REFLEX MICROSCOPIC
Bilirubin Urine: NEGATIVE
Glucose, UA: NEGATIVE
Hgb urine dipstick: NEGATIVE
Protein, ur: 30 — AB
Protein, ur: 30 — AB
Specific Gravity, Urine: 1.016
Urobilinogen, UA: 0.2
Urobilinogen, UA: 1

## 2011-03-13 LAB — BASIC METABOLIC PANEL
BUN: 10
Chloride: 98
GFR calc non Af Amer: 58 — ABNORMAL LOW
Glucose, Bld: 168 — ABNORMAL HIGH
Potassium: 3.2 — ABNORMAL LOW
Sodium: 136

## 2011-03-13 LAB — CBC
HCT: 31.1 — ABNORMAL LOW
HCT: 36
Hemoglobin: 10.7 — ABNORMAL LOW
Hemoglobin: 12.5
MCHC: 33.7
MCHC: 34.4
MCV: 96.9
MCV: 98.5
MCV: 99.4
Platelets: 186
Platelets: 202
Platelets: 242
RDW: 12.6
WBC: 11.9 — ABNORMAL HIGH
WBC: 18.4 — ABNORMAL HIGH

## 2011-03-13 LAB — DIFFERENTIAL
Basophils Relative: 0
Eosinophils Relative: 0
Lymphs Abs: 0.7
Monocytes Absolute: 0.4
Monocytes Relative: 2 — ABNORMAL LOW
Neutro Abs: 17.3 — ABNORMAL HIGH

## 2011-03-13 LAB — URINE MICROSCOPIC-ADD ON

## 2011-03-13 LAB — PHOSPHORUS: Phosphorus: 1.9 — ABNORMAL LOW

## 2011-03-13 LAB — RAPID URINE DRUG SCREEN, HOSP PERFORMED
Cocaine: NOT DETECTED
Opiates: POSITIVE — AB
Tetrahydrocannabinol: NOT DETECTED

## 2011-03-13 LAB — APTT: aPTT: 42 — ABNORMAL HIGH

## 2011-03-13 LAB — ETHANOL: Alcohol, Ethyl (B): <5

## 2011-03-13 LAB — MAGNESIUM: Magnesium: 2.1

## 2011-03-13 LAB — PROTIME-INR: Prothrombin Time: 15.9 — ABNORMAL HIGH

## 2011-03-13 LAB — URINE CULTURE: Colony Count: NO GROWTH

## 2011-03-13 MED ORDER — TECHNETIUM TC 99M TETROFOSMIN IV KIT
11.0000 | PACK | Freq: Once | INTRAVENOUS | Status: AC | PRN
  Administered 2011-03-13: 11 via INTRAVENOUS

## 2011-03-13 MED ORDER — REGADENOSON 0.4 MG/5ML IV SOLN
0.4000 mg | Freq: Once | INTRAVENOUS | Status: AC
  Administered 2011-03-13: 0.4 mg via INTRAVENOUS

## 2011-03-13 MED ORDER — TECHNETIUM TC 99M TETROFOSMIN IV KIT
33.0000 | PACK | Freq: Once | INTRAVENOUS | Status: AC | PRN
  Administered 2011-03-13: 33 via INTRAVENOUS

## 2011-03-13 NOTE — Progress Notes (Signed)
[facility] [place] [address] [phone]  Cardiology Nuclear Med Study  Cristina Parker is a 75 y.o. female [id number] 06-19-1935   Nuclear Med Background Indication for Stress Test:  Evaluation for Ischemia, Graft Patency and Surgical Clearance Pending (R) CEA by Dr. Sherren Mocha Early History:  COPD, Emphysema and '99 (L) CEA; '05 CABG Cardiac Risk Factors: Carotid Disease: 99 Left CEA and 90% RICA, Family History - CAD, History of Smoking, Hypertension and MCN:[id number] stenosis Right Subclavian Artery Symptoms:  DOE and SOB   Nuclear Pre-Procedure Caffeine/Decaff Intake:  None NPO After: 8:00pm   Lungs:  Clear.  O2 SAT 98% on RA IV 0.9% NS with Angio Cath:  20g  IV Site: R Hand  IV Started by:  Matilde Haymaker, RN  Chest Size (in):  36 Cup Size: C  Height: 5' (1.524 m)  Weight:  119 lb (53.978 kg)  BMI:  Body mass index is 23.24 kg/(m^2). Tech Comments:  Toprol not held per patient    Nuclear Med Study 1 or 2 day study: 1 day  Stress Test Type:  Lexiscan  Reading MD: Loralie Champagne, MD  Order Authorizing Provider:  Jenkins Rouge, MD  Resting Radionuclide: Technetium 58mTetrofosmin  Resting Radionuclide Dose: 11.0 mCi   Stress Radionuclide:  Technetium 963metrofosmin  Stress Radionuclide Dose: 33.0 mCi           Stress Protocol Rest HR: 58 Stress HR: 80  Rest BP: 110/76 Stress BP: 128/88  Exercise Time (min): n/a METS: n/a   Predicted Max HR: 147 bpm % Max HR: 54.42 bpm Rate Pressure Product: 10240   Dose of Adenosine (mg):  n/a Dose of Lexiscan: 0.4 mg  Dose of Atropine (mg): n/a Dose of Dobutamine: n/a mcg/kg/min (at max HR)  Stress Test Technologist: ShLetta MoynahanCMA-N  Nuclear Technologist:  StCharlton AmorCNMT     Rest Procedure:  Myocardial perfusion imaging was performed at rest 45 minutes following the intravenous administration of Technetium 9948mtrofosmin.  Rest ECG: No acute  changes.  Stress Procedure:  The patient received IV Lexiscan 0.4 mg over 15-seconds.  Technetium 37m37mrofosmin injected at 30-seconds.  There were nonspecific T-wave changes and rare PAC's with Lexiscan.  Quantitative spect images were obtained after a 45 minute delay.  Stress ECG: No significant change from baseline ECG  QPS Raw Data Images:  Normal; no motion artifact; normal heart/lung ratio. Stress Images:  Small apical perfusion defect.  Rest Images:  Normal perfusion Subtraction (SDS):  Small, primarily reversible apical perfusion defect.  Transient Ischemic Dilatation (Normal <1.22):  0.95 Lung/Heart Ratio (Normal <0.45):  0.31  Quantitative Gated Spect Images QGS EDV:  74 ml QGS ESV:  27 ml QGS cine images:  NL LV Function; NL Wall Motion QGS EF: 63%  Impression Exercise Capacity:  Lexiscan with no exercise. BP Response:  Normal blood pressure response. Clinical Symptoms:  Feels "weird" ECG Impression:  No change from baseline.  Comparison with Prior Nuclear Study: No previous nuclear study performed  Overall Impression:  Low risk stress nuclear study.  Small, primarily reversible apical perfusion defect most likely represents attenuation.  Probably normal study.  Normal EF.  Vontae Court McLeNavistar International [employer]

## 2011-03-19 ENCOUNTER — Encounter: Payer: Self-pay | Admitting: Vascular Surgery

## 2011-03-20 ENCOUNTER — Encounter: Payer: Self-pay | Admitting: Vascular Surgery

## 2011-03-20 ENCOUNTER — Ambulatory Visit (INDEPENDENT_AMBULATORY_CARE_PROVIDER_SITE_OTHER): Payer: Medicare Other | Admitting: Vascular Surgery

## 2011-03-20 VITALS — BP 116/64 | HR 67 | Resp 16 | Ht 60.0 in | Wt 119.0 lb

## 2011-03-20 DIAGNOSIS — I6529 Occlusion and stenosis of unspecified carotid artery: Secondary | ICD-10-CM

## 2011-03-20 NOTE — Progress Notes (Signed)
Patient Care Coordination Note         Rosetta Posner, MD 03/20/2011 10:14 AM     The patient presents today for continued followup of her extracranial cerebrovascular occlusive disease. I had seen her one month ago with a duplex showing critical right carotid stenosis. She subsequently underwent a CT scan to have reviewed with the patient. This does show extensive extracranial cerebrovascular occlusive disease. Her prior left carotid endarterectomy is widely patent. He does have severe stenosis at her common carotid artery origin from the arch. She also has severe stenosis of her left clavian origin at the arch. Her prior arch to an innominate artery bypass is widely patent. He does have a critical stenosis in her right internal carotid artery at its origin.    Past Medical History   Diagnosis  Date   .  Heart disease     .  Crohn's disease     .  Duodenal stricture     .  Esophageal reflux     .  Hypertension     .  Hyperlipemia     .  Hiatal hernia     .  Pneumonia     .  Anemia     .  Depression     .  Collagenous colitis     .  Anxiety           History   Substance Use Topics   .  Smoking status:  Former Smoker       Types:  Cigarettes       Quit date:  06/18/1996   .  Smokeless tobacco:  Never Used   .  Alcohol Use:  3.5 - 7.0 oz/week       7-14 drink(s) per week         Family History   Problem  Relation  Age of Onset   .  Colon polyps  Father     .  Heart disease  Father     .  Stroke  Father     .  Colon cancer  Neg Hx          No Known Allergies   Current outpatient prescriptions:acetaminophen (TYLENOL) 325 MG tablet, Take 325 mg by mouth as needed.  , Disp: , Rfl: ;  Acetaminophen-Caffeine (EXCEDRIN TENSION HEADACHE) 500-65 MG TABS, Take 2 tablets by mouth daily.  , Disp: , Rfl: ;  Ascorbic Acid (VITAMIN C) 1000 MG tablet, Take 1,000 mg by mouth daily.  , Disp: , Rfl: ; aspirin 81 MG tablet, Take 81 mg by mouth daily.  , Disp: , Rfl:   atorvastatin  (LIPITOR) 40 MG tablet, Take 40 mg by mouth daily.  , Disp: , Rfl: ;  Calcium Citrate-Vitamin D 250-200 MG-UNIT TABS, Take 2 tablets by mouth daily.  , Disp: , Rfl: ;  clopidogrel (PLAVIX) 75 MG tablet, Take 75 mg by mouth daily.  , Disp: , Rfl: ;  escitalopram (LEXAPRO) 20 MG tablet, Take 20 mg by mouth daily.  , Disp: , Rfl:   HYDROcodone-acetaminophen (VICODIN) 5-500 MG per tablet, Take 1-3 tablets by mouth as needed.  , Disp: , Rfl: ;  lisinopril-hydrochlorothiazide (PRINZIDE,ZESTORETIC) 10-12.5 MG per tablet, Take 1 tablet by mouth daily.  , Disp: , Rfl: ;  metoprolol (TOPROL-XL) 100 MG 24 hr tablet, 1/2 tab po qd , Disp: , Rfl: ;  pantoprazole (PROTONIX) 40 MG tablet, Take 40 mg by mouth daily.  , Disp: , Rfl:  BP 116/64  Pulse 67  Resp 16  Ht 5' (1.524 m)  Wt 119 lb (53.978 kg)  BMI 23.24 kg/m2   Body mass index is 23.24 kg/(m^2).               Physical exam: No change from her prior study 2 weeks ago. Her logically she is grossly intact. Heart regular rate and rhythm. Ms. radial pulses bilaterally. Chest clear bilaterally. Abdomen soft nontender.   She did undergo echocardiogram and cardiac clearance was no contraindication to surgery.   Impression and plan: Diffuse exposure vascular occlusive disease. I discussed this at length with the patient. I would not recommend treatment of her left common carotid or left subclavian origin disease. In the magnitude of this would not be indicated for asymptomatic disease. I have recommended right carotid endarterectomy for reduction of stroke risk. He understands procedure and is willing to proceed we have scheduled this at her convenience for 04/11/2011 she understands 1-2 per percent risk of stroke with surgery.

## 2011-04-04 ENCOUNTER — Other Ambulatory Visit: Payer: Self-pay | Admitting: Vascular Surgery

## 2011-04-04 ENCOUNTER — Encounter (HOSPITAL_COMMUNITY)
Admission: RE | Admit: 2011-04-04 | Discharge: 2011-04-04 | Disposition: A | Discharge status: Home / Self Care | Payer: Medicare Other | Source: Ambulatory Visit | Attending: Vascular Surgery | Admitting: Vascular Surgery

## 2011-04-04 DIAGNOSIS — I6521 Occlusion and stenosis of right carotid artery: Secondary | ICD-10-CM

## 2011-04-04 LAB — CBC
Platelets: 230 10*3/uL (ref 150–400)
RBC: 3.59 MIL/uL — ABNORMAL LOW (ref 3.87–5.11)
RDW: 12.7 % (ref 11.5–15.5)
WBC: 6.8 10*3/uL (ref 4.0–10.5)

## 2011-04-04 LAB — TYPE AND SCREEN
ABO/RH(D): O POS
Antibody Screen: NEGATIVE

## 2011-04-04 LAB — APTT: aPTT: 29 seconds (ref 24–37)

## 2011-04-04 LAB — COMPREHENSIVE METABOLIC PANEL
ALT: 15 U/L (ref 0–35)
AST: 22 U/L (ref 0–37)
Calcium: 9.7 mg/dL (ref 8.4–10.5)
Creatinine, Ser: 0.68 mg/dL (ref 0.50–1.10)
GFR calc Af Amer: >90 mL/min (ref >90)
GFR calc non Af Amer: 85 mL/min — ABNORMAL LOW (ref >90)
Sodium: 137 mEq/L (ref 135–145)
Total Protein: 7.1 g/dL (ref 6.0–8.3)

## 2011-04-04 LAB — URINALYSIS, ROUTINE W REFLEX MICROSCOPIC
Bilirubin Urine: NEGATIVE
Ketones, ur: NEGATIVE mg/dL
Protein, ur: NEGATIVE mg/dL
Specific Gravity, Urine: 1.011 (ref 1.005–1.030)

## 2011-04-04 LAB — ABO/RH: ABO/RH(D): O POS

## 2011-04-04 LAB — URINE MICROSCOPIC-ADD ON

## 2011-04-04 LAB — SURGICAL PCR SCREEN: Staphylococcus aureus: POSITIVE — AB

## 2011-04-04 LAB — PROTIME-INR: Prothrombin Time: 13 seconds (ref 11.6–15.2)

## 2011-04-11 ENCOUNTER — Other Ambulatory Visit: Payer: Self-pay | Admitting: Vascular Surgery

## 2011-04-11 ENCOUNTER — Inpatient Hospital Stay (HOSPITAL_COMMUNITY)
Admission: RE | Admit: 2011-04-11 | Discharge: 2011-04-12 | DRG: 039 | Disposition: A | Discharge status: Home / Self Care | Payer: Medicare Other | Source: Ambulatory Visit | Attending: Vascular Surgery | Admitting: Vascular Surgery

## 2011-04-11 DIAGNOSIS — Z87891 Personal history of nicotine dependence: Secondary | ICD-10-CM

## 2011-04-11 DIAGNOSIS — Z01812 Encounter for preprocedural laboratory examination: Secondary | ICD-10-CM

## 2011-04-11 DIAGNOSIS — I2510 Atherosclerotic heart disease of native coronary artery without angina pectoris: Secondary | ICD-10-CM | POA: Diagnosis present

## 2011-04-11 DIAGNOSIS — J449 Chronic obstructive pulmonary disease, unspecified: Secondary | ICD-10-CM | POA: Diagnosis present

## 2011-04-11 DIAGNOSIS — Z01818 Encounter for other preprocedural examination: Secondary | ICD-10-CM

## 2011-04-11 DIAGNOSIS — I6529 Occlusion and stenosis of unspecified carotid artery: Secondary | ICD-10-CM

## 2011-04-11 DIAGNOSIS — I10 Essential (primary) hypertension: Secondary | ICD-10-CM | POA: Diagnosis present

## 2011-04-11 DIAGNOSIS — K219 Gastro-esophageal reflux disease without esophagitis: Secondary | ICD-10-CM | POA: Diagnosis present

## 2011-04-11 DIAGNOSIS — Z01810 Encounter for preprocedural cardiovascular examination: Secondary | ICD-10-CM

## 2011-04-11 DIAGNOSIS — Z951 Presence of aortocoronary bypass graft: Secondary | ICD-10-CM

## 2011-04-11 DIAGNOSIS — J4489 Other specified chronic obstructive pulmonary disease: Secondary | ICD-10-CM | POA: Diagnosis present

## 2011-04-11 HISTORY — PX: CAROTID ENDARTERECTOMY: SUR193

## 2011-04-12 LAB — BASIC METABOLIC PANEL
BUN: 11 mg/dL (ref 6–23)
Calcium: 8.7 mg/dL (ref 8.4–10.5)
Creatinine, Ser: 0.57 mg/dL (ref 0.50–1.10)
GFR calc Af Amer: >90 mL/min (ref >90)
GFR calc non Af Amer: 90 mL/min — ABNORMAL LOW (ref >90)
Potassium: 4 mEq/L (ref 3.5–5.1)

## 2011-04-12 LAB — CBC
HCT: 27.1 % — ABNORMAL LOW (ref 36.0–46.0)
MCH: 32.8 pg (ref 26.0–34.0)
MCHC: 33.2 g/dL (ref 30.0–36.0)
RDW: 12.9 % (ref 11.5–15.5)

## 2011-04-13 NOTE — Discharge Summary (Addendum)
  NAMEDENIESE, OBERRY NO.:  [id number]  MEDICAL RECORD NO.:  [id number]  LOCATION:  23                         FACILITY:  [facility]  PHYSICIAN:  Rosetta Posner, M.D.    DATE OF BIRTH:  1935-12-13  DATE OF ADMISSION:  04/11/2011 DATE OF DISCHARGE:                              DISCHARGE SUMMARY   HISTORY OF PRESENT ILLNESS:  Ms. Streeter is a 75 year old woman who had a left carotid endarterectomy, and an aorto-innominate bypass procedure of carotid was in 1999, the aorto-innominate bypass was in 2005.  She has been followed with serial ultrasounds in our office, and the most recent showed an increase in her end-diastolic velocity from the 70s to 150s, and her peak systolic velocity of 179, which denotes an 80-99% stenosis of the right carotid.  The patient was asymptomatic.  She denied any issues with speech, hemiplegia, amaurosis.  She was admitted for an elective right carotid endarterectomy.  PAST MEDICAL HISTORY:  Significant for coronary artery disease, cerebral artery disease as described above, and hypercholesterolemia.  HOSPITAL COURSE:  The patient was taken to the operating room on April 11, 2011, for a right carotid endarterectomy with Dacron patch angioplasty.  The patient did well overnight.  She had no headache and no difficulty swallowing.  She was ambulating, voiding, and taking p.o. Right neck was soft without hematoma.  She had no tongue deviation.  No facial droop.  She is alert and oriented x3.  She had good and equal strength in bilateral upper and lower extremities.  FINAL DIAGNOSIS:  Asymptomatic critical right carotid stenosis status post right carotid endarterectomy.  DISPOSITION:  The patient was discharged home.  She will follow up with Dr. Donnetta Hutching in 2 weeks.  Both written and verbal instructions were given to the patient.  DISCHARGE MEDS: 1. Hydrocodone 5/500 mg half to 1 tablet every 4 hours as needed for     pain. 2. Aspirin  81 mg daily. 3. Calcium 2 tablets daily. 4. Iron 28 mg every other day. 5. Lexapro 20 mg daily. 6. Lipitor 40 mg daily. 7. Lisinopril/hydrochlorothiazide 10/12.5 daily. 8. Metoprolol 100 mg 1/2 tablet daily. 9. Multivitamins daily. 10.Pantoprazole 40 mg daily. 11.Plavix 75 mg daily. 12.Vitamin C daily.     Wray Kearns, PA-C   ______________________________ Rosetta Posner, M.D.    RR/MEDQ  D:  04/12/2011  T:  04/12/2011  Job:  [id number]  Electronically Signed by Wray Kearns PA on 04/13/2011 09:32:22 AM Electronically Signed by Brennen Camper M.D. on 04/18/2011 01:17:33 PM

## 2011-04-18 NOTE — Op Note (Signed)
NAMEARACELI, ARANGO NO.:  [id number]  MEDICAL RECORD NO.:  [id number]  LOCATION:  89                         FACILITY:  [facility]  PHYSICIAN:  Rosetta Posner, M.D.    DATE OF BIRTH:  05-Aug-1935  DATE OF PROCEDURE:  04/11/2011 DATE OF DISCHARGE:  04/12/2011                              OPERATIVE REPORT   PREOPERATIVE DIAGNOSIS:  Severe asymptomatic right internal carotid artery stenosis.  POSTOPERATIVE DIAGNOSIS:  Severe asymptomatic right internal carotid artery stenosis.  PROCEDURE:  Right carotid endarterectomy and Dacron patch angioplasty.  SURGEON:  Rosetta Posner, MD  ASSISTANT:  Conrad [place], MD  ANESTHESIA:  General endotracheal.  COMPLICATIONS:  None.  DISPOSITION:  To recovery room stable.  PROCEDURE IN DETAIL:  The patient was taken to the operating room and placed in supine position.  The area of the right neck was prepped and draped in usual sterile fashion.  An incision was made in anterior sternocleidomastoid and carried down to the platysma with electrocautery.  The sternocleidomastoid was dissected posteriorly, then the carotid sheath was opened.  The common carotid artery was encircled with an umbilical tape and Rumel tourniquet.  Dissection was continued onto the bifurcation, and the superior thyroid artery was encircled with 2-0 silk Potts tie, the external carotid was encircled with a vessel loop and the internal carotid encircled with an umbilical tape and Rumel tourniquet.  The vagus and hypoglossal nerves were identified and preserved.  The patient was given 7000 units of intravenous heparin. After an adequate circulation time, the internal, external, and common carotid arteries were occluded.  Common carotid artery was opened with 11 blade and sewn longitudinally with Potts scissors through the plaque onto the internal carotid artery.  The 10 shunt was passed up the internal carotid, allowed to back bleed and then down the  common carotid where it was secured with Rumel tourniquets.  The endarterectomy was carried along the common carotid artery, and the plaque was divided proximally with Potts scissors.  Endarterectomy was carried down to the bifurcation.  The external carotids were endarterectomized with eversion technique, and the internal carotids endarterectomized in open fashion. The remaining atheromatous debris was removed from the endarterectomy plane.  Finesse Hemashield Dacron patch was brought onto the field and sewn as a patch angioplasty with a running 6-0 Prolene suture.  Prior to completion of the anastomosis, the shunt was removed and the usual flushing maneuvers were undertaken.  Anastomosis was completed and flow was restored first to the external, then the internal carotid arteries. Excellent flow characteristics were noted with handheld Doppler in the internal and external carotid arteries.  The patient was given 50 mg of protamine to reverse the heparin.  The wounds were irrigated with saline.  Hemostasis with electrocautery.  The wounds were closed with 3- 0 Vicryl to reapproximate the sternocleidomastoid over the carotid sheath.  Next, the platysma was closed with a running 3-0 Vicryl suture, and finally the skin was closed with a 4-0 subcuticular Vicryl stitch.  Benzoin and Steri-Strips were applied.  The patient was taken to the recovery room after being awakened neurologically intact in the operating room.     Arvilla Meres.  Early, M.D.     TFE/MEDQ  D:  04/13/2011  T:  04/13/2011  Job:  [id number]  Electronically Signed by TODD EARLY M.D. on 04/18/2011 01:17:36 PM

## 2011-04-30 ENCOUNTER — Encounter: Payer: Self-pay | Admitting: Vascular Surgery

## 2011-05-01 ENCOUNTER — Ambulatory Visit (INDEPENDENT_AMBULATORY_CARE_PROVIDER_SITE_OTHER): Payer: Medicare Other | Admitting: Vascular Surgery

## 2011-05-01 ENCOUNTER — Encounter: Payer: Self-pay | Admitting: Vascular Surgery

## 2011-05-01 VITALS — BP 96/58 | HR 92 | Temp 97.9°F | Ht 60.0 in | Wt 121.0 lb

## 2011-05-01 DIAGNOSIS — Z9889 Other specified postprocedural states: Secondary | ICD-10-CM

## 2011-05-01 DIAGNOSIS — I6529 Occlusion and stenosis of unspecified carotid artery: Secondary | ICD-10-CM

## 2011-05-01 NOTE — Progress Notes (Signed)
The patient presents today for followup after her right carotid endarterectomy and Dacron patch angioplasty on 04/11/2011. She did well in the hospital and was discharged home on postoperative day #1. She continues to do well with no neurologic deficits.  Physical exam: Well-developed well-nourished white female in no acute distress. Neurologically grossly intact. Right neck incision well healed. Soft carotid bruits bilaterally.  Impression and plan: Stable status post right carotid endarterectomy. She does have known supra-aortic trunk occlusive disease which is asymptomatic. She will continue followup in 6 months with a carotid duplex she will notify should she develop any neurologic deficits in the interim.

## 2011-06-06 ENCOUNTER — Ambulatory Visit (INDEPENDENT_AMBULATORY_CARE_PROVIDER_SITE_OTHER): Payer: Medicare Other

## 2011-06-06 DIAGNOSIS — D539 Nutritional anemia, unspecified: Secondary | ICD-10-CM

## 2011-06-06 DIAGNOSIS — J45909 Unspecified asthma, uncomplicated: Secondary | ICD-10-CM

## 2011-07-03 ENCOUNTER — Encounter (HOSPITAL_COMMUNITY): Payer: Self-pay | Admitting: Emergency Medicine

## 2011-07-03 ENCOUNTER — Emergency Department (HOSPITAL_COMMUNITY): Payer: Medicare Other

## 2011-07-03 ENCOUNTER — Inpatient Hospital Stay (HOSPITAL_COMMUNITY)
Admission: EM | Admit: 2011-07-03 | Discharge: 2011-07-13 | DRG: 390 | Disposition: A | Discharge status: Home / Self Care | Payer: Medicare Other | Attending: Internal Medicine | Admitting: Internal Medicine

## 2011-07-03 ENCOUNTER — Other Ambulatory Visit: Payer: Self-pay

## 2011-07-03 DIAGNOSIS — I480 Paroxysmal atrial fibrillation: Secondary | ICD-10-CM | POA: Diagnosis present

## 2011-07-03 DIAGNOSIS — J449 Chronic obstructive pulmonary disease, unspecified: Secondary | ICD-10-CM | POA: Diagnosis present

## 2011-07-03 DIAGNOSIS — I10 Essential (primary) hypertension: Secondary | ICD-10-CM | POA: Diagnosis present

## 2011-07-03 DIAGNOSIS — Z951 Presence of aortocoronary bypass graft: Secondary | ICD-10-CM

## 2011-07-03 DIAGNOSIS — I4891 Unspecified atrial fibrillation: Secondary | ICD-10-CM | POA: Diagnosis present

## 2011-07-03 DIAGNOSIS — K56609 Unspecified intestinal obstruction, unspecified as to partial versus complete obstruction: Secondary | ICD-10-CM | POA: Diagnosis present

## 2011-07-03 DIAGNOSIS — F329 Major depressive disorder, single episode, unspecified: Secondary | ICD-10-CM | POA: Diagnosis present

## 2011-07-03 DIAGNOSIS — Z96649 Presence of unspecified artificial hip joint: Secondary | ICD-10-CM

## 2011-07-03 DIAGNOSIS — E785 Hyperlipidemia, unspecified: Secondary | ICD-10-CM | POA: Diagnosis present

## 2011-07-03 DIAGNOSIS — J441 Chronic obstructive pulmonary disease with (acute) exacerbation: Secondary | ICD-10-CM | POA: Diagnosis present

## 2011-07-03 DIAGNOSIS — J4489 Other specified chronic obstructive pulmonary disease: Secondary | ICD-10-CM | POA: Diagnosis present

## 2011-07-03 DIAGNOSIS — E876 Hypokalemia: Secondary | ICD-10-CM | POA: Diagnosis not present

## 2011-07-03 DIAGNOSIS — F3289 Other specified depressive episodes: Secondary | ICD-10-CM | POA: Diagnosis present

## 2011-07-03 DIAGNOSIS — I2510 Atherosclerotic heart disease of native coronary artery without angina pectoris: Secondary | ICD-10-CM | POA: Diagnosis present

## 2011-07-03 DIAGNOSIS — F411 Generalized anxiety disorder: Secondary | ICD-10-CM | POA: Diagnosis present

## 2011-07-03 HISTORY — DX: Paroxysmal atrial fibrillation: I48.0

## 2011-07-03 LAB — DIFFERENTIAL
Basophils Absolute: 0 10*3/uL (ref 0.0–0.1)
Basophils Relative: 0 % (ref 0–1)
Eosinophils Absolute: 0 10*3/uL (ref 0.0–0.7)
Neutro Abs: 12 10*3/uL — ABNORMAL HIGH (ref 1.7–7.7)
Neutrophils Relative %: 91 % — ABNORMAL HIGH (ref 43–77)

## 2011-07-03 LAB — URINALYSIS, ROUTINE W REFLEX MICROSCOPIC
Glucose, UA: NEGATIVE mg/dL
Leukocytes, UA: NEGATIVE
Protein, ur: NEGATIVE mg/dL
Specific Gravity, Urine: 1.02 (ref 1.005–1.030)
pH: 7 (ref 5.0–8.0)

## 2011-07-03 LAB — HEPATIC FUNCTION PANEL
Albumin: 4.3 g/dL (ref 3.5–5.2)
Alkaline Phosphatase: 173 U/L — ABNORMAL HIGH (ref 39–117)
Total Protein: 7.7 g/dL (ref 6.0–8.3)

## 2011-07-03 LAB — PRO B NATRIURETIC PEPTIDE: Pro B Natriuretic peptide (BNP): 2686 pg/mL — ABNORMAL HIGH (ref 0–125)

## 2011-07-03 LAB — POCT I-STAT, CHEM 8
Glucose, Bld: 179 mg/dL — ABNORMAL HIGH (ref 70–99)
HCT: 43 % (ref 36.0–46.0)
Hemoglobin: 14.6 g/dL (ref 12.0–15.0)
Potassium: 3.9 mEq/L (ref 3.5–5.1)

## 2011-07-03 LAB — PROTIME-INR: Prothrombin Time: 12.5 seconds (ref 11.6–15.2)

## 2011-07-03 LAB — CBC
MCH: 32.8 pg (ref 26.0–34.0)
MCHC: 33.9 g/dL (ref 30.0–36.0)
Platelets: 254 10*3/uL (ref 150–400)
RDW: 12.4 % (ref 11.5–15.5)

## 2011-07-03 LAB — OCCULT BLOOD, POC DEVICE: Fecal Occult Bld: NEGATIVE

## 2011-07-03 MED ORDER — MORPHINE SULFATE 4 MG/ML IJ SOLN
4.0000 mg | Freq: Once | INTRAMUSCULAR | Status: AC
  Administered 2011-07-03: 4 mg via INTRAMUSCULAR
  Filled 2011-07-03: qty 1

## 2011-07-03 MED ORDER — IOHEXOL 300 MG/ML  SOLN
100.0000 mL | Freq: Once | INTRAMUSCULAR | Status: AC | PRN
  Administered 2011-07-03: 100 mL via INTRAVENOUS

## 2011-07-03 MED ORDER — ONDANSETRON HCL 4 MG/2ML IJ SOLN
4.0000 mg | Freq: Once | INTRAMUSCULAR | Status: AC
  Administered 2011-07-03: 4 mg via INTRAVENOUS
  Filled 2011-07-03: qty 2

## 2011-07-03 MED ORDER — SODIUM CHLORIDE 0.9 % IV BOLUS (SEPSIS)
500.0000 mL | Freq: Once | INTRAVENOUS | Status: AC
  Administered 2011-07-03: 500 mL via INTRAVENOUS

## 2011-07-03 MED ORDER — ONDANSETRON 8 MG PO TBDP
8.0000 mg | ORAL_TABLET | Freq: Once | ORAL | Status: AC
  Administered 2011-07-03: 8 mg via ORAL
  Filled 2011-07-03: qty 1

## 2011-07-03 MED ORDER — MORPHINE SULFATE 4 MG/ML IJ SOLN
4.0000 mg | Freq: Once | INTRAMUSCULAR | Status: AC
  Administered 2011-07-03: 4 mg via INTRAVENOUS
  Filled 2011-07-03: qty 1

## 2011-07-03 NOTE — ED Notes (Signed)
Bolus continues to infuse.  Report called to TCU will go to room 33

## 2011-07-03 NOTE — H&P (Addendum)
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 She states she woke up at 3 AM this morning and she had some vague abdominal pain in all quadrants. She describes it more as a soreness 3/10 at its worse. She went back to bed. she later developed nausea and dry heaves but no vomiting approximately 3 PM. Her soreness and remained, she did not feel right, She finally decided to come to the ER. The patient states that there is no radiation of the pain, and there was no burning on urination, no chest pains, no palpitations. She's had no fever. She states she's never had a small bowel obstructions prior to this. In the ER the patient look comfortable, her abdomen is benign however she just received pain medications. She states she did have 2-3 very small bowel movements in the course of the day. The surgeon Dr. Dalbert Batman has been alerted to the patient's a admission by the ER physician. He states he will see her in the morning.  Review of Systems: positives bolded anorexia, fever, weight loss,, vision loss, decreased hearing, hoarseness, chest pain, syncope, dyspnea on exertion, peripheral edema, balance deficits, hemoptysis, abdominal pain, melena, hematochezia, severe indigestion/heartburn, hematuria, incontinence, genital sores, muscle weakness, suspicious skin lesions, transient blindness, difficulty walking, depression, unusual weight change, abnormal bleeding, enlarged lymph nodes, angioedema, and breast masses.  Past Medical History: Past Medical History  Diagnosis Date  . Heart disease   . Crohn's disease   . Duodenal stricture   . Esophageal reflux   . Hypertension   . Hyperlipemia   . Hiatal hernia   . Pneumonia   . Anemia   . Depression   . Collagenous colitis   . Anxiety    Past Surgical History  Procedure Date  . Coronary artery bypass graft   . Hip surgery     right  . Abdominal hysterectomy    . Cervical disc surgery   . Bunionectomy 1977    both feet   . Fracture surgery 1998    ORIF  . Eye surgery 10/2010    left eye cataract  . Carotid endarterectomy 05-1998    Left CEA  . Carotid endarterectomy 04/11/11    Right CEA  . Aorto- innominate bpg 2005    for innominate artery occlusive disease    Medications: Prior to Admission medications   Medication Sig Start Date End Date Taking? Authorizing Provider  Ascorbic Acid (VITAMIN C) 1000 MG tablet Take 1,000 mg by mouth daily.     Yes Historical Provider, MD  aspirin 81 MG tablet Take 81 mg by mouth daily.     Yes Historical Provider, MD  aspirin-acetaminophen-caffeine (EXCEDRIN MIGRAINE) [phone] MG per tablet Take 1 tablet by mouth every 8 (eight) hours as needed. For headaches.   Yes Historical Provider, MD  atorvastatin (LIPITOR) 40 MG tablet Take 40 mg by mouth daily.     Yes Historical Provider, MD  Calcium Citrate-Vitamin D 250-200 MG-UNIT TABS Take 2 tablets by mouth daily.     Yes Historical Provider, MD  clopidogrel (PLAVIX) 75 MG tablet Take 75 mg by mouth daily.   Yes Historical Provider, MD  escitalopram (LEXAPRO) 20 MG tablet Take 20 mg by mouth daily.     Yes Historical Provider, MD  HYDROcodone-acetaminophen (VICODIN) 5-500 MG per tablet Take 1 tablet by mouth every 8 (eight) hours as needed. For pain.  Yes Historical Provider, MD  lisinopril-hydrochlorothiazide (PRINZIDE,ZESTORETIC) 10-12.5 MG per tablet Take 1 tablet by mouth daily.     Yes Historical Provider, MD  loperamide (IMODIUM) 2 MG capsule Take 2 mg by mouth 4 (four) times daily as needed. For diarrhea relief.   Yes Historical Provider, MD  metoprolol (TOPROL-XL) 100 MG 24 hr tablet Take 50 mg by mouth daily.    Yes Historical Provider, MD  pantoprazole (PROTONIX) 40 MG tablet Take 40 mg by mouth daily.     Yes Historical Provider, MD    Allergies:  No Known Allergies  Social History:  reports that she quit smoking about 15 years ago. Her  smoking use included Cigarettes. She has never used smokeless tobacco. She reports that she drinks about 3.5 - 7 ounces of alcohol per week. She reports that she does not use illicit drugs.  Family History: Family History  Problem Relation Age of Onset  . Colon polyps Father   . Heart disease Father   . Stroke Father   . Colon cancer Neg Hx     Physical Exam: Filed Vitals:   07/03/11 1524 07/03/11 2010  BP: 136/112 139/80  Pulse: 73 84  Temp: 97.9 F (36.6 C)   TempSrc: Oral   Resp: 18 18  SpO2: 100% 92%    General:  Alert and oriented times three, well developed and nourished, no acute distress Eyes: PERRLA, pink conjunctiva, no scleral icterus ENT: Moist oral mucosa, neck supple, no thyromegaly Lungs: clear to ascultation, no wheeze, no crackles, no use of accessory muscles Cardiovascular: regular rate and rhythm, no regurgitation, no gallops, no murmurs. No carotid bruits, no JVD Abdomen: soft, positive BS, non-tender, non-distended, no organomegaly, not an acute abdomen GU: not examined Neuro: CN II - XII grossly intact, sensation intact Musculoskeletal: strength 5/5 all extremities, no clubbing, cyanosis or edema Skin: no rash, no subcutaneous crepitation, no decubitus Psych: appropriate patient   Labs on Admission:   [facility] 07/03/11 1703  NA 137  K 3.9  CL 102  CO2 --  GLUCOSE 179*  BUN 14  CREATININE 0.60  CALCIUM --  MG --  PHOS --    Basename 07/03/11 1643  AST 22  ALT 16  ALKPHOS 173*  BILITOT 0.5  PROT 7.7  ALBUMIN 4.3    Basename 07/03/11 1643  LIPASE 49  AMYLASE --    Basename 07/03/11 1703 07/03/11 1643  WBC -- 13.2*  NEUTROABS -- 12.0*  HGB 14.6 13.1  HCT 43.0 38.7  MCV -- 96.8  PLT -- 254   No results found for this basename: CKTOTAL:3,CKMB:3,CKMBINDEX:3,TROPONINI:3 in the last 72 hours No components found with this basename: POCBNP:3 No results found for this basename: DDIMER:2 in the last 72 hours No results found for  this basename: HGBA1C:2 in the last 72 hours No results found for this basename: CHOL:2,HDL:2,LDLCALC:2,TRIG:2,CHOLHDL:2,LDLDIRECT:2 in the last 72 hours No results found for this basename: TSH,T4TOTAL,FREET3,T3FREE,THYROIDAB in the last 72 hours No results found for this basename: VITAMINB12:2,FOLATE:2,FERRITIN:2,TIBC:2,IRON:2,RETICCTPCT:2 in the last 72 hours  Micro Results: No results found for this or any previous visit (from the past 240 hour(s)).   Radiological Exams on Admission: Ct Abdomen Pelvis W Contrast  07/03/2011  *RADIOLOGY REPORT*  Clinical Data: This abdominal pain.  History of Crohn's colitis.  CT ABDOMEN AND PELVIS WITH CONTRAST  Technique:  Multidetector CT imaging of the abdomen and pelvis was performed following the standard protocol during bolus administration of intravenous contrast.  Contrast: 121m OMNIPAQUE IOHEXOL 300 MG/ML  IV SOLN  Comparison: None.  Findings: Small hiatal hernia.  Contraction of the antrum may be related to peristalsis ( limiting evaluation of this region).  Dilated mid to distal jejunum and proximal and mid ileum which may be related to adhesions with a small amount of free fluid in the pelvis, left anterior pelvic region and perihepatic region.  No free intraperitoneal air.  Colon is predominately collapsed.  Mild lobulated contour of the liver.  Mild cirrhosis may be present. Focal small calcification left lobe liver otherwise no focal hepatic lesion.  No calcified gallstones.  Calcification within the spleen without focal lesion otherwise noted.  No focal pancreatic lesion.  Mild hyperplasia adrenal glands bilaterally.  No renal lesion.  Atherosclerotic type changes of the aorta with moderate to marked narrowing the lower abdominal aorta and proximal common iliac arteries.  Narrowing of the celiac artery, superior mesenteric artery and renal arteries.  Noncontrast filled views of the urinary bladder without gross abnormality.  Post hysterectomy.  Anterior  wedge compression fracture T10 with retropulsion posterior inferior aspect with spinal stenosis and slight cord flattening. This appears remote.  Anterior wedge compression deformity of T12 involving the inferior endplate with mild retropulsion and spur posterior inferior aspect with mild spinal stenosis.  This appears remote.  Scoliosis and degenerative changes throughout the lower thoracic and lumbar spine.  Prior right hip fracture with placement of a dynamic sliding type screw.  The fracture at the base of the right femoral neck is still well visualized as a lucency with prominent bony overgrowth surround this level suggesting nonunion.  IMPRESSION: Small bowel obstruction as discussed above.  Mild lobulated contour of the liver.  Mild cirrhosis may be present.  Atherosclerotic type changes of the aorta with moderate to marked narrowing the lower abdominal aorta and proximal common iliac arteries.  Narrowing of the celiac artery, superior mesenteric artery and renal arteries.  Anterior wedge compression fracture T10 with retropulsion posterior inferior aspect with spinal stenosis and slight cord flattening. This appears remote.  Anterior wedge compression deformity of T12 involving the inferior endplate with mild retropulsion and spur posterior inferior aspect with mild spinal stenosis.  This appears remote.  Scoliosis and degenerative changes throughout the lower thoracic and lumbar spine.  Prior right hip fracture with placement of a dynamic sliding type screw.  The fracture at the base of the right femoral neck is still well visualized as a lucency with prominent bony overgrowth surround this level suggesting nonunion.  Original Report Authenticated By: Doug Sou, M.D.   Dg Abd Acute W/chest  07/03/2011  *RADIOLOGY REPORT*  Clinical Data: Abdomen and chest pain  ACUTE ABDOMEN SERIES (ABDOMEN 2 VIEW & CHEST 1 VIEW)  Comparison: 04/04/2011  Findings: Prior median sternotomy and CABG procedure.  Stable  right upper lobe scarring.  No airspace consolidation identified.  The bowel gas pattern is abnormal.  There are dilated loops of small bowel and multiple air- fluid levels consistent with small bowel obstruction.  The small bowel loops measure up to 3.2 cm.  Scoliosis deformity involves the lumbar spine with convexity towards the right.  IMPRESSION:  1.  Findings consistent with small bowel obstruction. 2.  Right upper lobe scarring.  Original Report Authenticated By: Angelita Ingles, M.D.    Assessment/Plan Present on Admission:  .SBO (small bowel obstruction) Admit to telemetry A surgical consult Dr. Dalbert Batman aware Patient will be n.p.o. with IV fluid for hydration  With no nausea, no vomiting therefore NG tube not ordered  Pain medication ordered as needed, Protonix IV daily  Repeat KUB in a.m. Marland KitchenCORONARY HEART DISEASE .CROHN'S DISEASE .HYPERTENSION .Other and unspecified hyperlipidemia Resume home medications except NSAIDs Plavix also held  Full code DVT prophylaxis Team 5/Dr. Linton Flemings, Nastassja Witkop 07/03/2011, 11:52 PM

## 2011-07-03 NOTE — ED Notes (Signed)
Remedicated prior to going to CT.

## 2011-07-03 NOTE — ED Notes (Signed)
Glucose stablizer decreased to 6.3 unit/hr

## 2011-07-03 NOTE — ED Notes (Signed)
Report given to Jan, Therapist, sports. Remains alert, no further dry heaves.

## 2011-07-03 NOTE — ED Notes (Signed)
Dr Claria Dice in to see patient for admission

## 2011-07-03 NOTE — ED Notes (Signed)
Bolus of 1/2 liter started

## 2011-07-03 NOTE — ED Notes (Signed)
Patient resting comfortably.  Sts that the nausea has completely subsided and pain is gone

## 2011-07-04 ENCOUNTER — Inpatient Hospital Stay (HOSPITAL_COMMUNITY): Payer: Medicare Other

## 2011-07-04 LAB — BASIC METABOLIC PANEL
CO2: 23 mEq/L (ref 19–32)
Chloride: 100 mEq/L (ref 96–112)
Glucose, Bld: 111 mg/dL — ABNORMAL HIGH (ref 70–99)
Potassium: 3.8 mEq/L (ref 3.5–5.1)
Sodium: 133 mEq/L — ABNORMAL LOW (ref 135–145)

## 2011-07-04 LAB — CBC
HCT: 34.7 % — ABNORMAL LOW (ref 36.0–46.0)
Hemoglobin: 11.6 g/dL — ABNORMAL LOW (ref 12.0–15.0)
MCH: 32.4 pg (ref 26.0–34.0)
MCV: 96.9 fL (ref 78.0–100.0)
RBC: 3.58 MIL/uL — ABNORMAL LOW (ref 3.87–5.11)
WBC: 12.3 10*3/uL — ABNORMAL HIGH (ref 4.0–10.5)

## 2011-07-04 MED ORDER — LISINOPRIL-HYDROCHLOROTHIAZIDE 10-12.5 MG PO TABS: 1.0000 | ORAL_TABLET | Freq: Every day | ORAL | Status: DC

## 2011-07-04 MED ORDER — HYDROCODONE-ACETAMINOPHEN 5-325 MG PO TABS
1.0000 | ORAL_TABLET | ORAL | Status: DC | PRN
  Administered 2011-07-04 – 2011-07-12 (×3): 1 via ORAL
  Filled 2011-07-04 (×4): qty 1

## 2011-07-04 MED ORDER — ESCITALOPRAM OXALATE 20 MG PO TABS
20.0000 mg | ORAL_TABLET | Freq: Every day | ORAL | Status: DC
  Administered 2011-07-04 – 2011-07-05 (×2): 20 mg via ORAL
  Filled 2011-07-04 (×4): qty 1

## 2011-07-04 MED ORDER — LISINOPRIL 10 MG PO TABS
10.0000 mg | ORAL_TABLET | Freq: Every day | ORAL | Status: DC
  Administered 2011-07-04 – 2011-07-05 (×2): 10 mg via ORAL
  Filled 2011-07-04 (×4): qty 1

## 2011-07-04 MED ORDER — HYDROCHLOROTHIAZIDE 12.5 MG PO CAPS
12.5000 mg | ORAL_CAPSULE | Freq: Every day | ORAL | Status: DC
  Administered 2011-07-04 – 2011-07-05 (×2): 12.5 mg via ORAL
  Filled 2011-07-04 (×4): qty 1

## 2011-07-04 MED ORDER — ENOXAPARIN SODIUM 40 MG/0.4ML ~~LOC~~ SOLN
40.0000 mg | SUBCUTANEOUS | Status: DC
  Administered 2011-07-04 – 2011-07-07 (×4): 40 mg via SUBCUTANEOUS
  Filled 2011-07-04 (×4): qty 0.4

## 2011-07-04 MED ORDER — POTASSIUM CHLORIDE IN NACL 20-0.9 MEQ/L-% IV SOLN
INTRAVENOUS | Status: DC
  Administered 2011-07-04: 08:00:00 via INTRAVENOUS
  Filled 2011-07-04 (×3): qty 1000

## 2011-07-04 MED ORDER — ONDANSETRON HCL 4 MG/2ML IJ SOLN
4.0000 mg | Freq: Four times a day (QID) | INTRAMUSCULAR | Status: DC | PRN
  Administered 2011-07-04 – 2011-07-10 (×15): 4 mg via INTRAVENOUS
  Filled 2011-07-04 (×15): qty 2

## 2011-07-04 MED ORDER — KCL IN DEXTROSE-NACL 20-5-0.9 MEQ/L-%-% IV SOLN
INTRAVENOUS | Status: DC
  Administered 2011-07-04 – 2011-07-12 (×15): via INTRAVENOUS
  Filled 2011-07-04 (×25): qty 1000

## 2011-07-04 MED ORDER — ONDANSETRON HCL 4 MG PO TABS
4.0000 mg | ORAL_TABLET | Freq: Four times a day (QID) | ORAL | Status: DC | PRN
  Administered 2011-07-04 – 2011-07-05 (×2): 4 mg via ORAL
  Filled 2011-07-04 (×2): qty 1

## 2011-07-04 MED ORDER — MORPHINE SULFATE 2 MG/ML IJ SOLN
2.0000 mg | INTRAMUSCULAR | Status: DC | PRN
  Administered 2011-07-04 – 2011-07-10 (×12): 2 mg via INTRAVENOUS
  Filled 2011-07-04 (×13): qty 1

## 2011-07-04 MED ORDER — METOPROLOL SUCCINATE ER 50 MG PO TB24
50.0000 mg | ORAL_TABLET | Freq: Every day | ORAL | Status: DC
  Administered 2011-07-04 – 2011-07-05 (×2): 50 mg via ORAL
  Filled 2011-07-04 (×4): qty 1

## 2011-07-04 MED ORDER — PANTOPRAZOLE SODIUM 40 MG IV SOLR
40.0000 mg | INTRAVENOUS | Status: DC
  Administered 2011-07-04 – 2011-07-13 (×10): 40 mg via INTRAVENOUS
  Filled 2011-07-04 (×10): qty 40

## 2011-07-04 NOTE — ED Provider Notes (Signed)
History     CSN: [id number]  Arrival date & time 07/03/11  1516   First MD Initiated Contact with Patient 07/03/11 1549      Chief Complaint  Patient presents with  . Abdominal Pain    epigastric since 4pm today.  Has had BM's x 2 today.  states they were normal  . Nausea    no vomiting.      (Consider location/radiation/quality/duration/timing/severity/associated sxs/prior treatment) HPI Patient is a 76 year old female who presents with one day of diffuse abdominal pain. She noted this this morning. She has had associated nausea with one episode of vomiting. She said she felt that her pain would be better is she had a bowel movement. She's had 3 small bowel movements with minimal relief. Patient has history of vaginal hysterectomy, C-section, and tubal ligation in the past. She denies any other abdominal surgeries. She denies any fevers or sick contacts. Patient denies any urinary symptoms. Her pain is an 8/10. She has not taken anything for this at home. There are no other associated or modifying factors the Past Medical History  Diagnosis Date  . Heart disease   . Crohn's disease   . Duodenal stricture   . Esophageal reflux   . Hypertension   . Hyperlipemia   . Hiatal hernia   . Pneumonia   . Anemia   . Depression   . Collagenous colitis   . Anxiety     Past Surgical History  Procedure Date  . Coronary artery bypass graft   . Hip surgery     right  . Abdominal hysterectomy   . Cervical disc surgery   . Bunionectomy 1977    both feet   . Fracture surgery 1998    ORIF  . Eye surgery 10/2010    left eye cataract  . Carotid endarterectomy 05-1998    Left CEA  . Carotid endarterectomy 04/11/11    Right CEA  . 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 Colon polyps Father   . Heart disease Father   . Stroke Father   . Colon cancer Neg Hx     History  Substance Use Topics  .  Smoking status: Former Smoker    Types: Cigarettes    Quit date: 06/18/1996  . Smokeless tobacco: Never Used  . Alcohol Use: 3.5 - 7.0 oz/week    7-14 drink(s) per week    OB History    Grav Para Term Preterm Abortions TAB SAB Ect Mult Living                  Review of Systems  Constitutional: Negative.   HENT: Negative.   Eyes: Negative.   Respiratory: Negative.   Cardiovascular: Negative.   Gastrointestinal: Positive for nausea, vomiting, abdominal pain and constipation.  Genitourinary: Negative.   Musculoskeletal: Negative.   Skin: Negative.   Neurological: Negative.   Hematological: Negative.   Psychiatric/Behavioral: Negative.   All other systems reviewed and are negative.    Allergies  Review of patient's allergies indicates no known allergies.  Home Medications   Current Outpatient Rx  Name Route Sig Dispense Refill  . VITAMIN C 1000 MG PO TABS Oral Take 1,000 mg by mouth daily.      . ASPIRIN 81 MG PO TABS Oral Take 81 mg by mouth daily.      . ASPIRIN-ACETAMINOPHEN-CAFFEINE 250-250-65 MG PO TABS Oral Take  1 tablet by mouth every 8 (eight) hours as needed. For headaches.    . ATORVASTATIN CALCIUM 40 MG PO TABS Oral Take 40 mg by mouth daily.      Marland Kitchen CALCIUM CITRATE-VITAMIN D 250-200 MG-UNIT PO TABS Oral Take 2 tablets by mouth daily.      Marland Kitchen CLOPIDOGREL BISULFATE 75 MG PO TABS Oral Take 75 mg by mouth daily.    Marland Kitchen ESCITALOPRAM OXALATE 20 MG PO TABS Oral Take 20 mg by mouth daily.      Marland Kitchen HYDROCODONE-ACETAMINOPHEN 5-500 MG PO TABS Oral Take 1 tablet by mouth every 8 (eight) hours as needed. For pain.    Marland Kitchen LISINOPRIL-HYDROCHLOROTHIAZIDE 10-12.5 MG PO TABS Oral Take 1 tablet by mouth daily.      Marland Kitchen LOPERAMIDE HCL 2 MG PO CAPS Oral Take 2 mg by mouth 4 (four) times daily as needed. For diarrhea relief.    Marland Kitchen METOPROLOL SUCCINATE ER 100 MG PO TB24 Oral Take 50 mg by mouth daily.     Marland Kitchen PANTOPRAZOLE SODIUM 40 MG PO TBEC Oral Take 40 mg by mouth daily.        BP 116/62   Pulse 82  Temp(Src) 98.4 F (36.9 C) (Oral)  Resp 16  SpO2 93%  Physical Exam  Nursing note and vitals reviewed. Constitutional: She is oriented to person, place, and time. She appears well-developed and well-nourished. No distress.       Uncomfortable  HENT:  Head: Normocephalic and atraumatic.  Eyes: Conjunctivae and EOM are normal. Pupils are equal, round, and reactive to light.  Neck: Normal range of motion.  Cardiovascular: Normal rate, regular rhythm, normal heart sounds and intact distal pulses.  Exam reveals no gallop and no friction rub.   No murmur heard. Pulmonary/Chest: Effort normal and breath sounds normal. No respiratory distress. She has no wheezes. She has no rales.  Abdominal: Soft. Bowel sounds are normal. She exhibits distension. There is tenderness. There is no rebound and no guarding.       Mild distension with diffuse TTP  Genitourinary: Guaiac negative stool.       Rectal with stool palpated in the rectal vault without impaction. Stool is soft and brown  Musculoskeletal: Normal range of motion. She exhibits no edema.  Neurological: She is alert and oriented to person, place, and time. No cranial nerve deficit. She exhibits normal muscle tone. Coordination normal.  Skin: Skin is warm and dry. No rash noted.  Psychiatric: She has a normal mood and affect.    ED Course  Procedures (including critical care time)  Labs Reviewed  CBC - Abnormal; Notable for the following:    WBC 13.2 (*)    All other components within normal limits  DIFFERENTIAL - Abnormal; Notable for the following:    Neutrophils Relative 91 (*)    Neutro Abs 12.0 (*)    Lymphocytes Relative 6 (*)    All other components within normal limits  LACTIC ACID, PLASMA - Abnormal; Notable for the following:    Lactic Acid, Venous 3.0 (*)    All other components within normal limits  URINALYSIS, ROUTINE W REFLEX MICROSCOPIC - Abnormal; Notable for the following:    Ketones, ur 15 (*)    All  other components within normal limits  PRO B NATRIURETIC PEPTIDE - Abnormal; Notable for the following:    Pro B Natriuretic peptide (BNP) 2686.0 (*)    All other components within normal limits  POCT I-STAT, CHEM 8 - Abnormal; Notable for the  following:    Glucose, Bld 179 (*)    Calcium, Ion 1.10 (*)    All other components within normal limits  HEPATIC FUNCTION PANEL - Abnormal; Notable for the following:    Alkaline Phosphatase 173 (*)    All other components within normal limits  PROTIME-INR  LIPASE, BLOOD  POCT I-STAT TROPONIN I  OCCULT BLOOD, POC DEVICE  I-STAT TROPONIN I  I-STAT, CHEM 8  POCT OCCULT BLOOD STOOL, DEVICE   Ct Abdomen Pelvis W Contrast  07/03/2011  *RADIOLOGY REPORT*  Clinical Data: This abdominal pain.  History of Crohn's colitis.  CT ABDOMEN AND PELVIS WITH CONTRAST  Technique:  Multidetector CT imaging of the abdomen and pelvis was performed following the standard protocol during bolus administration of intravenous contrast.  Contrast: 123m OMNIPAQUE IOHEXOL 300 MG/ML IV SOLN  Comparison: None.  Findings: Small hiatal hernia.  Contraction of the antrum may be related to peristalsis ( limiting evaluation of this region).  Dilated mid to distal jejunum and proximal and mid ileum which may be related to adhesions with a small amount of free fluid in the pelvis, left anterior pelvic region and perihepatic region.  No free intraperitoneal air.  Colon is predominately collapsed.  Mild lobulated contour of the liver.  Mild cirrhosis may be present. Focal small calcification left lobe liver otherwise no focal hepatic lesion.  No calcified gallstones.  Calcification within the spleen without focal lesion otherwise noted.  No focal pancreatic lesion.  Mild hyperplasia adrenal glands bilaterally.  No renal lesion.  Atherosclerotic type changes of the aorta with moderate to marked narrowing the lower abdominal aorta and proximal common iliac arteries.  Narrowing of the celiac  artery, superior mesenteric artery and renal arteries.  Noncontrast filled views of the urinary bladder without gross abnormality.  Post hysterectomy.  Anterior wedge compression fracture T10 with retropulsion posterior inferior aspect with spinal stenosis and slight cord flattening. This appears remote.  Anterior wedge compression deformity of T12 involving the inferior endplate with mild retropulsion and spur posterior inferior aspect with mild spinal stenosis.  This appears remote.  Scoliosis and degenerative changes throughout the lower thoracic and lumbar spine.  Prior right hip fracture with placement of a dynamic sliding type screw.  The fracture at the base of the right femoral neck is still well visualized as a lucency with prominent bony overgrowth surround this level suggesting nonunion.  IMPRESSION: Small bowel obstruction as discussed above.  Mild lobulated contour of the liver.  Mild cirrhosis may be present.  Atherosclerotic type changes of the aorta with moderate to marked narrowing the lower abdominal aorta and proximal common iliac arteries.  Narrowing of the celiac artery, superior mesenteric artery and renal arteries.  Anterior wedge compression fracture T10 with retropulsion posterior inferior aspect with spinal stenosis and slight cord flattening. This appears remote.  Anterior wedge compression deformity of T12 involving the inferior endplate with mild retropulsion and spur posterior inferior aspect with mild spinal stenosis.  This appears remote.  Scoliosis and degenerative changes throughout the lower thoracic and lumbar spine.  Prior right hip fracture with placement of a dynamic sliding type screw.  The fracture at the base of the right femoral neck is still well visualized as a lucency with prominent bony overgrowth surround this level suggesting nonunion.  Original Report Authenticated By: SDoug Sou M.D.   Dg Abd Acute W/chest  07/03/2011  *RADIOLOGY REPORT*  Clinical Data:  Abdomen and chest pain  ACUTE ABDOMEN SERIES (ABDOMEN 2 VIEW &  CHEST 1 VIEW)  Comparison: 04/04/2011  Findings: Prior median sternotomy and CABG procedure.  Stable right upper lobe scarring.  No airspace consolidation identified.  The bowel gas pattern is abnormal.  There are dilated loops of small bowel and multiple air- fluid levels consistent with small bowel obstruction.  The small bowel loops measure up to 3.2 cm.  Scoliosis deformity involves the lumbar spine with convexity towards the right.  IMPRESSION:  1.  Findings consistent with small bowel obstruction. 2.  Right upper lobe scarring.  Original Report Authenticated By: Angelita Ingles, M.D.    Date: 07/03/2011  Rate: 71  Rhythm: normal sinus rhythm  QRS Axis: normal  Intervals: PR prolonged  ST/T Wave abnormalities: nonspecific T wave changes  Conduction Disutrbances:none  Narrative Interpretation: new t wave inversions  Old EKG Reviewed: changes noted    1. Small bowel obstruction   2. Abdominal pain       MDM  Patient was evaluated by myself. Given her history of CAD patient did have a workup for abdominal pain as well as possible very atypical presentation of ACS. Initial EKG was reviewed and showed no acute ischemic changes.Troponin was negative. Acute abdominal series was concerning for small bowel structure and. Patient remained hemostatically stable. She was treated symptomatically with morphine and Zofran. Urinalysis, liver panel, and lipase were unremarkable. Patient did have a lactate of 3 and a white count of 13.2. BNP was elevated but there were no comparisons available. Patient did not have any clinical signs of heart failure. CT of abdomen and pelvis was performed. Patient did receive normal saline IV bolus in an arm. CT did demonstrate proximal small bowel instruction. Patient remained unchanged clinically. I discussed the patient's case with Dr. Dalbert Batman from surgery. He agreed that patient's case did not require acute  surgical intervention this evening. Moreover her clinical presentation was not truly consistent with a small bowel obstruction. He reviewed the CT and also did not feel like this is an impressive piece of evidence to support this diagnosis either. Patient will be admitted overnight for observation to hospitalist service. Patient was admitted in stable condition.        Chauncy Passy, MD 07/04/11 [phone]

## 2011-07-04 NOTE — Progress Notes (Signed)
CARE MANAGEMENT NOTE 07/04/2011  Patient:  Cristina Parker, Cristina Parker   Account Number:  [id number]  Date Initiated:  07/04/2011  Documentation initiated by:  Audree Schrecengost  Subjective/Objective Assessment:   pt with confirmed sbo and pain     Action/Plan:   lives at home   Anticipated DC Date:  07/07/2011   Anticipated DC Plan:  HOME/SELF CARE  In-house referral  NA      DC Planning Services  NA      [facility] Choice  NA   Choice offered to / List presented to:  NA   DME arranged  NA      DME agency  NA     [place] arranged  NA      [place] agency  NA   Status of service:  In process, will continue to follow Medicare Important Message given?  NA - LOS <3 / Initial given by admissions (If response is "NO", the following Medicare IM given date fields will be blank) Date Medicare IM given:   Date Additional Medicare IM given:    Discharge Disposition:    Per UR Regulation:  Reviewed for med. necessity/level of care/duration of stay  Comments:  [id number]/Brayam Boeke,RN,BSN,CCM

## 2011-07-04 NOTE — Progress Notes (Signed)
Subjective: States nausea ealier today, but not as this time, still some  abd pain. No fltatus, no BM so far today Objective: Vital signs in last 24 hours: Temp:  [97.6 F (36.4 C)-98.4 F (36.9 C)] 98.2 F (36.8 C) (01/16 2124) Pulse Rate:  [16-87] 83  (01/16 2124) Resp:  [16-18] 16  (01/16 2124) BP: (116-143)/(62-83) 125/79 mmHg (01/16 2124) SpO2:  [93 %-96 %] 93 % (01/16 2124) Weight:  [53.524 kg (118 lb)] 53.524 kg (118 lb) (01/16 0543) Last BM Date: 07/03/11 Intake/Output from previous day: 01/15 0701 - 01/16 0700 In: 1500 [I.V.:1500] Out: 2 [Urine:2] Intake/Output this shift:      General Appearance:    Alert, cooperative, no distress, appears stated age  Lungs:     Clear to auscultation, decreased BS at bases, respirations unlabored   Heart:    Regular rate and rhythm, S1 and S2 normal, no murmur, rub   or gallop  Abdomen:     Soft, upper abd/supraumbilical tenderness, decreased bowel sounds    no masses, no organomegaly  Extremities:    no cyanosis or edema  Neurologic:   CNII-XII intact, normal strength     Weight change:   Intake/Output Summary (Last 24 hours) at 07/04/11 2207 Last data filed at 07/04/11 1800  Gross per 24 hour  Intake 2279.5 ml  Output      2 ml  Net 2277.5 ml    Lab Results:   Basename 07/04/11 0428 07/03/11 1703  NA 133* 137  K 3.8 3.9  CL 100 102  CO2 23 --  GLUCOSE 111* 179*  BUN 13 14  CREATININE 0.64 0.60  CALCIUM 9.0 --    Basename 07/04/11 0428 07/03/11 1703 07/03/11 1643  WBC 12.3* -- 13.2*  HGB 11.6* 14.6 --  HCT 34.7* 43.0 --  PLT 220 -- 254  MCV 96.9 -- 96.8   PT/INR  Basename 07/03/11 1643  LABPROT 12.5  INR 0.91   ABG No results found for this basename: PHART:2,PCO2:2,PO2:2,HCO3:2 in the last 72 hours  Micro Results: No results found for this or any previous visit (from the past 240 hour(s)). Studies/Results: Dg Abd 1 View  07/04/2011  *RADIOLOGY REPORT*  Clinical Data: Nausea, mid abdominal pain,  evaluate small bowel obstruction  ABDOMEN - 1 VIEW  Comparison: 07/03/2011; abdominal CT - 07/03/2011  Findings:  The previously ingested enteric contrast remains within mildly dilated loops of centralized small bowel within the mid abdomen. Index small bowel loop within left mid hemiabdomen measures approximately 3.1 cm in diameter.  There is a conspicuous paucity of distal colonic gas.  Evaluation for pneumoperitoneum is limited secondary to supine patient positioning and exclusion of the right hemidiaphragm.  No definite pneumatosis or portal venous gas. Multilevel lumbar spine degenerative change.  Post right femoral neck and shaft pinning, incompletely evaluated.  Excreted contrast within the urinary bladder.  IMPRESSION: Findings suggestive of persistent small bowel obstruction.  Original Report Authenticated By: Rachel Moulds, M.D.   Ct Abdomen Pelvis W Contrast  07/03/2011  *RADIOLOGY REPORT*  Clinical Data: This abdominal pain.  History of Crohn's colitis.  CT ABDOMEN AND PELVIS WITH CONTRAST  Technique:  Multidetector CT imaging of the abdomen and pelvis was performed following the standard protocol during bolus administration of intravenous contrast.  Contrast: 166m OMNIPAQUE IOHEXOL 300 MG/ML IV SOLN  Comparison: None.  Findings: Small hiatal hernia.  Contraction of the antrum may be related to peristalsis ( limiting evaluation of this region).  Dilated mid  to distal jejunum and proximal and mid ileum which may be related to adhesions with a small amount of free fluid in the pelvis, left anterior pelvic region and perihepatic region.  No free intraperitoneal air.  Colon is predominately collapsed.  Mild lobulated contour of the liver.  Mild cirrhosis may be present. Focal small calcification left lobe liver otherwise no focal hepatic lesion.  No calcified gallstones.  Calcification within the spleen without focal lesion otherwise noted.  No focal pancreatic lesion.  Mild hyperplasia adrenal glands  bilaterally.  No renal lesion.  Atherosclerotic type changes of the aorta with moderate to marked narrowing the lower abdominal aorta and proximal common iliac arteries.  Narrowing of the celiac artery, superior mesenteric artery and renal arteries.  Noncontrast filled views of the urinary bladder without gross abnormality.  Post hysterectomy.  Anterior wedge compression fracture T10 with retropulsion posterior inferior aspect with spinal stenosis and slight cord flattening. This appears remote.  Anterior wedge compression deformity of T12 involving the inferior endplate with mild retropulsion and spur posterior inferior aspect with mild spinal stenosis.  This appears remote.  Scoliosis and degenerative changes throughout the lower thoracic and lumbar spine.  Prior right hip fracture with placement of a dynamic sliding type screw.  The fracture at the base of the right femoral neck is still well visualized as a lucency with prominent bony overgrowth surround this level suggesting nonunion.  IMPRESSION: Small bowel obstruction as discussed above.  Mild lobulated contour of the liver.  Mild cirrhosis may be present.  Atherosclerotic type changes of the aorta with moderate to marked narrowing the lower abdominal aorta and proximal common iliac arteries.  Narrowing of the celiac artery, superior mesenteric artery and renal arteries.  Anterior wedge compression fracture T10 with retropulsion posterior inferior aspect with spinal stenosis and slight cord flattening. This appears remote.  Anterior wedge compression deformity of T12 involving the inferior endplate with mild retropulsion and spur posterior inferior aspect with mild spinal stenosis.  This appears remote.  Scoliosis and degenerative changes throughout the lower thoracic and lumbar spine.  Prior right hip fracture with placement of a dynamic sliding type screw.  The fracture at the base of the right femoral neck is still well visualized as a lucency with  prominent bony overgrowth surround this level suggesting nonunion.  Original Report Authenticated By: Doug Sou, M.D.   Dg Abd Acute W/chest  07/03/2011  *RADIOLOGY REPORT*  Clinical Data: Abdomen and chest pain  ACUTE ABDOMEN SERIES (ABDOMEN 2 VIEW & CHEST 1 VIEW)  Comparison: 04/04/2011  Findings: Prior median sternotomy and CABG procedure.  Stable right upper lobe scarring.  No airspace consolidation identified.  The bowel gas pattern is abnormal.  There are dilated loops of small bowel and multiple air- fluid levels consistent with small bowel obstruction.  The small bowel loops measure up to 3.2 cm.  Scoliosis deformity involves the lumbar spine with convexity towards the right.  IMPRESSION:  1.  Findings consistent with small bowel obstruction. 2.  Right upper lobe scarring.  Original Report Authenticated By: Angelita Ingles, M.D.   Medications:  Scheduled Meds:   . enoxaparin  40 mg Subcutaneous Q24H  . escitalopram  20 mg Oral Daily  . lisinopril  10 mg Oral Daily   And  . hydrochlorothiazide  12.5 mg Oral Daily  . metoprolol succinate  50 mg Oral Daily  . pantoprazole (PROTONIX) IV  40 mg Intravenous Q24H  . sodium chloride  500 mL Intravenous Once  .  DISCONTD: lisinopril-hydrochlorothiazide  1 tablet Oral Daily   Continuous Infusions:   . dextrose 5 % and 0.9 % NaCl with KCl 20 mEq/L 100 mL/hr at 07/04/11 2051  . DISCONTD: 0.9 % NaCl with KCl 20 mEq / L 75 mL/hr at 07/04/11 0738   PRN Meds:.HYDROcodone-acetaminophen, morphine, ondansetron (ZOFRAN) IV, ondansetron Assessment/Plan: .SBO (small bowel obstruction)  Will keep npo, continue with IV fluid for hydration  Pain management, Protonix IV daily  Await surgical consult /Dr. Dalbert Batman eval and recs.  abd x-ray today still with persistent SBO - if any further n/v place NG,  F/U and repeat KUB in a.m. Marland KitchenCORONARY HEART DISEASE .CROHN'S DISEASE .HYPERTENSION .Other and unspecified hyperlipidemia   on home medications   except NSAIDs  Plavix also held   LOS: 1 day      Dakhari Zuver C 07/04/2011, 8:44 PM

## 2011-07-04 NOTE — ED Notes (Signed)
Patient  will stay in ED due to staffing decrease in TCU

## 2011-07-05 ENCOUNTER — Encounter (HOSPITAL_COMMUNITY): Payer: Self-pay | Admitting: General Surgery

## 2011-07-05 ENCOUNTER — Inpatient Hospital Stay (HOSPITAL_COMMUNITY): Payer: Medicare Other

## 2011-07-05 DIAGNOSIS — K56609 Unspecified intestinal obstruction, unspecified as to partial versus complete obstruction: Secondary | ICD-10-CM

## 2011-07-05 LAB — CBC
MCHC: 33.6 g/dL (ref 30.0–36.0)
Platelets: 219 10*3/uL (ref 150–400)
RDW: 12.7 % (ref 11.5–15.5)

## 2011-07-05 LAB — COMPREHENSIVE METABOLIC PANEL
ALT: 10 U/L (ref 0–35)
AST: 16 U/L (ref 0–37)
Albumin: 3.1 g/dL — ABNORMAL LOW (ref 3.5–5.2)
Alkaline Phosphatase: 112 U/L (ref 39–117)
BUN: 11 mg/dL (ref 6–23)
Potassium: 3.5 mEq/L (ref 3.5–5.1)
Sodium: 136 mEq/L (ref 135–145)
Total Protein: 5.8 g/dL — ABNORMAL LOW (ref 6.0–8.3)

## 2011-07-05 LAB — LACTIC ACID, PLASMA: Lactic Acid, Venous: 0.7 mmol/L (ref 0.5–2.2)

## 2011-07-05 MED ORDER — PHENOL 1.4 % MT LIQD
1.0000 | OROMUCOSAL | Status: DC | PRN
  Administered 2011-07-07: 1 via OROMUCOSAL
  Filled 2011-07-05 (×2): qty 177

## 2011-07-05 MED ORDER — BENZOCAINE (TOPICAL) 20 % EX AERO
INHALATION_SPRAY | Freq: Once | CUTANEOUS | Status: DC
  Filled 2011-07-05: qty 57

## 2011-07-05 MED ORDER — MENTHOL 3 MG MT LOZG
1.0000 | LOZENGE | OROMUCOSAL | Status: DC | PRN
  Filled 2011-07-05 (×2): qty 9

## 2011-07-05 MED ORDER — LORAZEPAM 2 MG/ML IJ SOLN
0.5000 mg | Freq: Two times a day (BID) | INTRAMUSCULAR | Status: DC | PRN
  Administered 2011-07-05 – 2011-07-10 (×4): 0.5 mg via INTRAVENOUS
  Filled 2011-07-05 (×5): qty 1

## 2011-07-05 NOTE — Consult Note (Signed)
I have seen and examined the patient and agree with the assessment and plans.  Will try conservative management with NG decompression.  If fails or looks worse, will proceed to the [facility]. Ninfa Linden  MD, FACS

## 2011-07-05 NOTE — Consult Note (Signed)
Cristina Parker 02-26-1936  [id number].   Requesting MD: Dr. Dillard Essex Chief Complaint/Reason for Consult: SBO HPI: This is a very pleasant 76 yo WF who began having some crampy abdominal pain around 4am on Tuesday morning.  She got up and had a "constipated" bowel movement.  She went back to bed, but continued to have this crampy discomfort.  Later in the day, around 11:00 or 12:00 she began to develop nausea.  She has not had any emesis as she hates throwing up.  Throughout the day, this continued to worsen.  She presented to the [facility] where she had a CT scan that revealed a SBO.  She was admitted.  She did not have an NG tube immediately placed as she was not having any emesis at the time.  Her repeat films continued to show a SBO with contrast still in the small bowel and not having progressed to the colon.  Her SB measures 3.7cm on today's x-rays.  We have been asked to evaluate the patient for further recommendations.  She did have a BM this morning, but has not passed any flatus in several days.  She is still nauseated and feels like she is close to throwing up.  Review of Systems: Please see HPI; otherwise all other systems are negative except the patient does admit to chronic bowel dysfunction secondary to colitis.  Family History  Problem Relation Age of Onset  . Colon polyps Father   . Heart disease Father   . Stroke Father   . Colon cancer Neg Hx     Past Medical History  Diagnosis Date  . Heart disease   . Crohn's disease   . Duodenal stricture   . Esophageal reflux   . Hypertension   . Hyperlipemia   . Hiatal hernia   . Pneumonia   . Anemia   . Depression   . Collagenous colitis   . Anxiety     Past Surgical History  Procedure Date  . Coronary artery bypass graft   . Hip surgery     right  . Vaginal hysterectomy   . Cervical disc surgery   . Bunionectomy 1977    both feet   . Fracture surgery 1998    ORIF  . Eye surgery 10/2010    left eye cataract  . Carotid  endarterectomy 05-1998    Left CEA  . Carotid endarterectomy 04/11/11    Right CEA  . Aorto- innominate bpg 2005    for innominate artery occlusive disease  C-section with subsequent tubal ligation, 40 some years ago  Social History:  reports that she quit smoking about 15 years ago. Her smoking use included Cigarettes. She has never used smokeless tobacco. She reports that she drinks about 3.5 - 7 ounces of alcohol per week. She reports that she does not use illicit drugs.  Allergies: No Known Allergies  Medications Prior to Admission  Medication Dose Route Frequency Provider Last Rate Last Dose  . dextrose 5 % and 0.9 % NaCl with KCl 20 mEq/L infusion   Intravenous Continuous Simbiso Ranga, MD 100 mL/hr at 07/05/11 0608    . enoxaparin (LOVENOX) injection 40 mg  40 mg Subcutaneous Q24H Debby Crosley, MD   40 mg at 07/05/11 0953  . escitalopram (LEXAPRO) tablet 20 mg  20 mg Oral Daily Debby Crosley, MD   20 mg at 07/05/11 0955  . lisinopril (PRINIVIL,ZESTRIL) tablet 10 mg  10 mg Oral Daily Debby Crosley, MD   10 mg at  07/05/11 0954   And  . hydrochlorothiazide (MICROZIDE) capsule 12.5 mg  12.5 mg Oral Daily Debby Crosley, MD   12.5 mg at 07/05/11 0955  . HYDROcodone-acetaminophen (NORCO) 5-325 MG per tablet 1 tablet  1 tablet Oral Q4H PRN Quintella Baton, MD   1 tablet at 07/05/11 0502  . iohexol (OMNIPAQUE) 300 MG/ML solution 100 mL  100 mL Intravenous Once PRN Medication Radiologist, MD   100 mL at 07/03/11 2116  . LORazepam (ATIVAN) injection 0.5 mg  0.5 mg Intravenous BID PRN Adeline C Viyuoh, MD      . metoprolol succinate (TOPROL-XL) 24 hr tablet 50 mg  50 mg Oral Daily Debby Crosley, MD   50 mg at 07/05/11 0954  . morphine 2 MG/ML injection 2 mg  2 mg Intravenous Q4H PRN Quintella Baton, MD   2 mg at 07/05/11 1201  . morphine 4 MG/ML injection 4 mg  4 mg Intravenous Once Chauncy Passy, MD   4 mg at 07/03/11 1741  . morphine 4 MG/ML injection 4 mg  4 mg Intramuscular Once Chauncy Passy, MD    4 mg at 07/03/11 2108  . ondansetron (ZOFRAN) injection 4 mg  4 mg Intravenous Once Chauncy Passy, MD   4 mg at 07/03/11 1739  . ondansetron (ZOFRAN) tablet 4 mg  4 mg Oral Q6H PRN Debby Crosley, MD   4 mg at 07/05/11 0502   Or  . ondansetron (ZOFRAN) injection 4 mg  4 mg Intravenous Q6H PRN Debby Crosley, MD   4 mg at 07/05/11 1121  . ondansetron (ZOFRAN-ODT) disintegrating tablet 8 mg  8 mg Oral Once Chauncy Passy, MD   8 mg at 07/03/11 2109  . pantoprazole (PROTONIX) injection 40 mg  40 mg Intravenous Q24H Debby Crosley, MD   40 mg at 07/05/11 0810  . sodium chloride 0.9 % bolus 500 mL  500 mL Intravenous Once Chauncy Passy, MD   500 mL at 07/03/11 2346  . DISCONTD: 0.9 % NaCl with KCl 20 mEq/ L  infusion   Intravenous Continuous Debby Crosley, MD 75 mL/hr at 07/04/11 0738    . DISCONTD: lisinopril-hydrochlorothiazide (PRINZIDE,ZESTORETIC) 10-12.5 MG per tablet 1 tablet  1 tablet Oral Daily Debby Crosley, MD       Medications Prior to Admission  Medication Sig Dispense Refill  . Ascorbic Acid (VITAMIN C) 1000 MG tablet Take 1,000 mg by mouth daily.        Marland Kitchen aspirin 81 MG tablet Take 81 mg by mouth daily.        Marland Kitchen atorvastatin (LIPITOR) 40 MG tablet Take 40 mg by mouth daily.        . Calcium Citrate-Vitamin D 250-200 MG-UNIT TABS Take 2 tablets by mouth daily.        Marland Kitchen escitalopram (LEXAPRO) 20 MG tablet Take 20 mg by mouth daily.        Marland Kitchen HYDROcodone-acetaminophen (VICODIN) 5-500 MG per tablet Take 1 tablet by mouth every 8 (eight) hours as needed. For pain.      Marland Kitchen lisinopril-hydrochlorothiazide (PRINZIDE,ZESTORETIC) 10-12.5 MG per tablet Take 1 tablet by mouth daily.        . metoprolol (TOPROL-XL) 100 MG 24 hr tablet Take 50 mg by mouth daily.       . pantoprazole (PROTONIX) 40 MG tablet Take 40 mg by mouth daily.          Blood pressure 106/68, pulse 76, temperature 97.7 F (36.5 C), temperature source Oral, resp. rate 16, height 5' (1.524 m),  weight 119 lb 3.2 oz (54.069 kg), SpO2  97.00%. Physical Exam: General: pleasant, well developed 76 yo WF who is laying in bed in NAD HEENT: head is normocephalic, atraumatic.  Sclera are noninjected.  PERRL.  Ears and nose without any obvious masses or lesions.  No rhinorrhea.  Mouth is pink. Heart: regular rate and rhythm.  Normal s1, s2.  No murmurs, gallops, or rubs noted.  She does have palpable radial and pedal pulses bilaterally. Lungs: CTAB, no wheezes, rhonchi, or rales noted.  Respiratory effort is nonlabored. Abd: soft, mild distention, tender greatest in the RLQ with mild LLQ tenderness.  No pain in the upper abdomen.  Hypoactive bowel sounds.  No masses, hernias, or organomegaly noted. MS: all 4 extremities are symmetrical with no cyanosis, clubbing, or edema Skin: warm and dry with no obvious masses, lesions, or rashes Psych: A&O x3, with an appropriate affect.   Results for orders placed during the hospital encounter of 07/03/11 (from the past 48 hour(s))  CBC     Status: Abnormal   Collection Time   07/03/11  4:43 PM      Component Value Range Comment   WBC 13.2 (*) 4.0 - 10.5 (K/uL)    RBC 4.00  3.87 - 5.11 (MIL/uL)    Hemoglobin 13.1  12.0 - 15.0 (g/dL)    HCT 38.7  36.0 - 46.0 (%)    MCV 96.8  78.0 - 100.0 (fL)    MCH 32.8  26.0 - 34.0 (pg)    MCHC 33.9  30.0 - 36.0 (g/dL)    RDW 12.4  11.5 - 15.5 (%)    Platelets 254  150 - 400 (K/uL)   DIFFERENTIAL     Status: Abnormal   Collection Time   07/03/11  4:43 PM      Component Value Range Comment   Neutrophils Relative 91 (*) 43 - 77 (%)    Neutro Abs 12.0 (*) 1.7 - 7.7 (K/uL)    Lymphocytes Relative 6 (*) 12 - 46 (%)    Lymphs Abs 0.9  0.7 - 4.0 (K/uL)    Monocytes Relative 3  3 - 12 (%)    Monocytes Absolute 0.3  0.1 - 1.0 (K/uL)    Eosinophils Relative 0  0 - 5 (%)    Eosinophils Absolute 0.0  0.0 - 0.7 (K/uL)    Basophils Relative 0  0 - 1 (%)    Basophils Absolute 0.0  0.0 - 0.1 (K/uL)   PROTIME-INR     Status: Normal   Collection Time   07/03/11   4:43 PM      Component Value Range Comment   Prothrombin Time 12.5  11.6 - 15.2 (seconds)    INR 0.91  0.00 - 1.49    LACTIC ACID, PLASMA     Status: Abnormal   Collection Time   07/03/11  4:43 PM      Component Value Range Comment   Lactic Acid, Venous 3.0 (*) 0.5 - 2.2 (mmol/L)   PRO B NATRIURETIC PEPTIDE     Status: Abnormal   Collection Time   07/03/11  4:43 PM      Component Value Range Comment   Pro B Natriuretic peptide (BNP) 2686.0 (*) 0 - 125 (pg/mL)   LIPASE, BLOOD     Status: Normal   Collection Time   07/03/11  4:43 PM      Component Value Range Comment   Lipase 49  11 - 59 (U/L)   HEPATIC FUNCTION  PANEL     Status: Abnormal   Collection Time   07/03/11  4:43 PM      Component Value Range Comment   Total Protein 7.7  6.0 - 8.3 (g/dL)    Albumin 4.3  3.5 - 5.2 (g/dL)    AST 22  0 - 37 (U/L)    ALT 16  0 - 35 (U/L)    Alkaline Phosphatase 173 (*) 39 - 117 (U/L)    Total Bilirubin 0.5  0.3 - 1.2 (mg/dL)    Bilirubin, Direct 0.1  0.0 - 0.3 (mg/dL)    Indirect Bilirubin 0.4  0.3 - 0.9 (mg/dL)   URINALYSIS, ROUTINE W REFLEX MICROSCOPIC     Status: Abnormal   Collection Time   07/03/11  4:56 PM      Component Value Range Comment   Color, Urine YELLOW  YELLOW     APPearance CLEAR  CLEAR     Specific Gravity, Urine 1.020  1.005 - 1.030     pH 7.0  5.0 - 8.0     Glucose, UA NEGATIVE  NEGATIVE (mg/dL)    Hgb urine dipstick NEGATIVE  NEGATIVE     Bilirubin Urine NEGATIVE  NEGATIVE     Ketones, ur 15 (*) NEGATIVE (mg/dL)    Protein, ur NEGATIVE  NEGATIVE (mg/dL)    Urobilinogen, UA 0.2  0.0 - 1.0 (mg/dL)    Nitrite NEGATIVE  NEGATIVE     Leukocytes, UA NEGATIVE  NEGATIVE  MICROSCOPIC NOT DONE ON URINES WITH NEGATIVE PROTEIN, BLOOD, LEUKOCYTES, NITRITE, OR GLUCOSE <1000 mg/dL.  POCT I-STAT TROPONIN I     Status: Normal   Collection Time   07/03/11  5:00 PM      Component Value Range Comment   Troponin i, poc 0.00  0.00 - 0.08 (ng/mL)    Comment 3            POCT I-STAT,  CHEM 8     Status: Abnormal   Collection Time   07/03/11  5:03 PM      Component Value Range Comment   Sodium 137  135 - 145 (mEq/L)    Potassium 3.9  3.5 - 5.1 (mEq/L)    Chloride 102  96 - 112 (mEq/L)    BUN 14  6 - 23 (mg/dL)    Creatinine, Ser 0.60  0.50 - 1.10 (mg/dL)    Glucose, Bld 179 (*) 70 - 99 (mg/dL)    Calcium, Ion 1.10 (*) 1.12 - 1.32 (mmol/L)    TCO2 23  0 - 100 (mmol/L)    Hemoglobin 14.6  12.0 - 15.0 (g/dL)    HCT 43.0  36.0 - 46.0 (%)   OCCULT BLOOD, POC DEVICE     Status: Normal   Collection Time   07/03/11 10:07 PM      Component Value Range Comment   Fecal Occult Bld NEGATIVE     BASIC METABOLIC PANEL     Status: Abnormal   Collection Time   07/04/11  4:28 AM      Component Value Range Comment   Sodium 133 (*) 135 - 145 (mEq/L)    Potassium 3.8  3.5 - 5.1 (mEq/L)    Chloride 100  96 - 112 (mEq/L)    CO2 23  19 - 32 (mEq/L)    Glucose, Bld 111 (*) 70 - 99 (mg/dL)    BUN 13  6 - 23 (mg/dL)    Creatinine, Ser 0.64  0.50 - 1.10 (mg/dL)  Calcium 9.0  8.4 - 10.5 (mg/dL)    GFR calc non Af Amer 86 (*) >90 (mL/min)    GFR calc Af Amer >90  >90 (mL/min)   CBC     Status: Abnormal   Collection Time   07/04/11  4:28 AM      Component Value Range Comment   WBC 12.3 (*) 4.0 - 10.5 (K/uL)    RBC 3.58 (*) 3.87 - 5.11 (MIL/uL)    Hemoglobin 11.6 (*) 12.0 - 15.0 (g/dL)    HCT 34.7 (*) 36.0 - 46.0 (%)    MCV 96.9  78.0 - 100.0 (fL)    MCH 32.4  26.0 - 34.0 (pg)    MCHC 33.4  30.0 - 36.0 (g/dL)    RDW 12.5  11.5 - 15.5 (%)    Platelets 220  150 - 400 (K/uL)   CBC     Status: Abnormal   Collection Time   07/05/11  4:24 AM      Component Value Range Comment   WBC 10.5  4.0 - 10.5 (K/uL)    RBC 3.82 (*) 3.87 - 5.11 (MIL/uL)    Hemoglobin 12.5  12.0 - 15.0 (g/dL)    HCT 37.2  36.0 - 46.0 (%)    MCV 97.4  78.0 - 100.0 (fL)    MCH 32.7  26.0 - 34.0 (pg)    MCHC 33.6  30.0 - 36.0 (g/dL)    RDW 12.7  11.5 - 15.5 (%)    Platelets 219  150 - 400 (K/uL)   COMPREHENSIVE  METABOLIC PANEL     Status: Abnormal   Collection Time   07/05/11  4:24 AM      Component Value Range Comment   Sodium 136  135 - 145 (mEq/L)    Potassium 3.5  3.5 - 5.1 (mEq/L)    Chloride 104  96 - 112 (mEq/L)    CO2 24  19 - 32 (mEq/L)    Glucose, Bld 116 (*) 70 - 99 (mg/dL)    BUN 11  6 - 23 (mg/dL)    Creatinine, Ser 0.60  0.50 - 1.10 (mg/dL)    Calcium 8.9  8.4 - 10.5 (mg/dL)    Total Protein 5.8 (*) 6.0 - 8.3 (g/dL)    Albumin 3.1 (*) 3.5 - 5.2 (g/dL)    AST 16  0 - 37 (U/L)    ALT 10  0 - 35 (U/L)    Alkaline Phosphatase 112  39 - 117 (U/L)    Total Bilirubin 0.4  0.3 - 1.2 (mg/dL)    GFR calc non Af Amer 88 (*) >90 (mL/min)    GFR calc Af Amer >90  >90 (mL/min)   LACTIC ACID, PLASMA     Status: Normal   Collection Time   07/05/11  4:24 AM      Component Value Range Comment   Lactic Acid, Venous 0.7  0.5 - 2.2 (mmol/L)   MAGNESIUM     Status: Normal   Collection Time   07/05/11  4:24 AM      Component Value Range Comment   Magnesium 1.9  1.5 - 2.5 (mg/dL)    Dg Abd 1 View  07/05/2011  *RADIOLOGY REPORT*  Clinical Data: Follow up small bowel obstruction.  ABDOMEN - 1 VIEW  Comparison: 07/04/2011  Findings: Abnormal small bowel dilatation is again noted.  Small bowel loops measure up to 3.7 cm.  Mild antegrade progression of enteric contrast material is identified with a small amount  of contrast within the proximal colon.  A paucity of distal colonic gas is noted.  IMPRESSION:  1.  Persistent abnormal small bowel dilatation consistent with obstruction.  Not significantly improved from previous exam.  Original Report Authenticated By: Angelita Ingles, M.D.   Dg Abd 1 View  07/04/2011  *RADIOLOGY REPORT*  Clinical Data: Nausea, mid abdominal pain, evaluate small bowel obstruction  ABDOMEN - 1 VIEW  Comparison: 07/03/2011; abdominal CT - 07/03/2011  Findings:  The previously ingested enteric contrast remains within mildly dilated loops of centralized small bowel within the mid  abdomen. Index small bowel loop within left mid hemiabdomen measures approximately 3.1 cm in diameter.  There is a conspicuous paucity of distal colonic gas.  Evaluation for pneumoperitoneum is limited secondary to supine patient positioning and exclusion of the right hemidiaphragm.  No definite pneumatosis or portal venous gas. Multilevel lumbar spine degenerative change.  Post right femoral neck and shaft pinning, incompletely evaluated.  Excreted contrast within the urinary bladder.  IMPRESSION: Findings suggestive of persistent small bowel obstruction.  Original Report Authenticated By: Rachel Moulds, M.D.   Ct Abdomen Pelvis W Contrast  07/03/2011  *RADIOLOGY REPORT*  Clinical Data: This abdominal pain.  History of Crohn's colitis.  CT ABDOMEN AND PELVIS WITH CONTRAST  Technique:  Multidetector CT imaging of the abdomen and pelvis was performed following the standard protocol during bolus administration of intravenous contrast.  Contrast: 111m OMNIPAQUE IOHEXOL 300 MG/ML IV SOLN  Comparison: None.  Findings: Small hiatal hernia.  Contraction of the antrum may be related to peristalsis ( limiting evaluation of this region).  Dilated mid to distal jejunum and proximal and mid ileum which may be related to adhesions with a small amount of free fluid in the pelvis, left anterior pelvic region and perihepatic region.  No free intraperitoneal air.  Colon is predominately collapsed.  Mild lobulated contour of the liver.  Mild cirrhosis may be present. Focal small calcification left lobe liver otherwise no focal hepatic lesion.  No calcified gallstones.  Calcification within the spleen without focal lesion otherwise noted.  No focal pancreatic lesion.  Mild hyperplasia adrenal glands bilaterally.  No renal lesion.  Atherosclerotic type changes of the aorta with moderate to marked narrowing the lower abdominal aorta and proximal common iliac arteries.  Narrowing of the celiac artery, superior mesenteric artery and  renal arteries.  Noncontrast filled views of the urinary bladder without gross abnormality.  Post hysterectomy.  Anterior wedge compression fracture T10 with retropulsion posterior inferior aspect with spinal stenosis and slight cord flattening. This appears remote.  Anterior wedge compression deformity of T12 involving the inferior endplate with mild retropulsion and spur posterior inferior aspect with mild spinal stenosis.  This appears remote.  Scoliosis and degenerative changes throughout the lower thoracic and lumbar spine.  Prior right hip fracture with placement of a dynamic sliding type screw.  The fracture at the base of the right femoral neck is still well visualized as a lucency with prominent bony overgrowth surround this level suggesting nonunion.  IMPRESSION: Small bowel obstruction as discussed above.  Mild lobulated contour of the liver.  Mild cirrhosis may be present.  Atherosclerotic type changes of the aorta with moderate to marked narrowing the lower abdominal aorta and proximal common iliac arteries.  Narrowing of the celiac artery, superior mesenteric artery and renal arteries.  Anterior wedge compression fracture T10 with retropulsion posterior inferior aspect with spinal stenosis and slight cord flattening. This appears remote.  Anterior wedge compression  deformity of T12 involving the inferior endplate with mild retropulsion and spur posterior inferior aspect with mild spinal stenosis.  This appears remote.  Scoliosis and degenerative changes throughout the lower thoracic and lumbar spine.  Prior right hip fracture with placement of a dynamic sliding type screw.  The fracture at the base of the right femoral neck is still well visualized as a lucency with prominent bony overgrowth surround this level suggesting nonunion.  Original Report Authenticated By: Doug Sou, M.D.   Dg Abd Acute W/chest  07/03/2011  *RADIOLOGY REPORT*  Clinical Data: Abdomen and chest pain  ACUTE ABDOMEN  SERIES (ABDOMEN 2 VIEW & CHEST 1 VIEW)  Comparison: 04/04/2011  Findings: Prior median sternotomy and CABG procedure.  Stable right upper lobe scarring.  No airspace consolidation identified.  The bowel gas pattern is abnormal.  There are dilated loops of small bowel and multiple air- fluid levels consistent with small bowel obstruction.  The small bowel loops measure up to 3.2 cm.  Scoliosis deformity involves the lumbar spine with convexity towards the right.  IMPRESSION:  1.  Findings consistent with small bowel obstruction. 2.  Right upper lobe scarring.  Original Report Authenticated By: Angelita Ingles, M.D.       Assessment/Plan 1. SBO Patient Active Problem List  Diagnoses  . Other and unspecified hyperlipidemia  . HYPERTENSION  . CORONARY HEART DISEASE  . VENTRICULAR ARRHYTHMIA  . PVD  . PNEUMONIA, ORGANISM UNSPECIFIED  . BRONCHITIS  . LUNG NODULE  . G E R D  . GASTRITIS  . OTHER OBSTRUCTION OF DUODENUM  . HIATAL HERNIA  . CROHN'S DISEASE  . OTHER ULCERATIVE COLITIS  . DIARRHEA  . CAROTID ENDARTERECTOMY, LEFT, HX OF  . SBO (small bowel obstruction)  . COPD (chronic obstructive pulmonary disease)   Plan: 1. Agree with placement of an NG tube for bowel decompression.  Currently the patient is without flatus and has no evidence of contrast in her colon or much air in her colon on x-ray.  This is concerning given the patient has already had 2 days of bowel rest.  She did have a BM this am, but I suspect this may have been remaining stool in the colon that was already present.  We will repeat abdominal films in the morning to determine if there has been any progress made after placement of the NG tube.  If within, 48-72 hours there is no progress with her NGT, I suspect the patient may need surgical intervention for correction of her SBO.  We will follow the patient with you.  Thank you for this consultation.  Kenzli Barritt E 07/05/2011, 1:19 PM

## 2011-07-05 NOTE — Progress Notes (Signed)
Subjective: Still with intermittent nausea, no vomiting, no flatus, no bowel movement. Objective: Vital signs in last 24 hours: Temp:  [97.6 F (36.4 C)-98.2 F (36.8 C)] 97.7 F (36.5 C) (01/17 0611) Pulse Rate:  [16-83] 76  (01/17 0611) Resp:  [16] 16  (01/17 0611) BP: (106-143)/(68-83) 106/68 mmHg (01/17 0611) SpO2:  [93 %-97 %] 97 % (01/17 0611) Weight:  [54.069 kg (119 lb 3.2 oz)] 54.069 kg (119 lb 3.2 oz) (01/17 0611) Last BM Date: 07/03/11 Intake/Output from previous day: 01/16 0701 - 01/17 0700 In: 779.5 [I.V.:777.5; IV Piggyback:2] Out: 901 [Urine:900; Stool:1] Intake/Output this shift: Total I/O In: -  Out: 150 [Urine:150]    General Appearance:    Alert, cooperative, no distress, appears stated age  Lungs:     Clear to auscultation, decreased BS at bases, respirations unlabored   Heart:    Regular rate and rhythm, S1 and S2 normal, no murmur, rub   or gallop  Abdomen:     Soft, upper abd/supraumbilical tenderness, decreased bowel sounds, mildly distended    no masses, no organomegaly  Extremities:    no cyanosis or edema  Neurologic:   CNII-XII intact, normal strength     Weight change: 0.544 kg (1 lb 3.2 oz)  Intake/Output Summary (Last 24 hours) at 07/05/11 1256 Last data filed at 07/05/11 1137  Gross per 24 hour  Intake  779.5 ml  Output   1051 ml  Net -271.5 ml    Lab Results:   [facility] 07/05/11 0424 07/04/11 0428  NA 136 133*  K 3.5 3.8  CL 104 100  CO2 24 23  GLUCOSE 116* 111*  BUN 11 13  CREATININE 0.60 0.64  CALCIUM 8.9 9.0    Basename 07/05/11 0424 07/04/11 0428  WBC 10.5 12.3*  HGB 12.5 11.6*  HCT 37.2 34.7*  PLT 219 220  MCV 97.4 96.9   PT/INR  Basename 07/03/11 1643  LABPROT 12.5  INR 0.91   ABG No results found for this basename: PHART:2,PCO2:2,PO2:2,HCO3:2 in the last 72 hours  Micro Results: No results found for this or any previous visit (from the past 240 hour(s)). Studies/Results: Dg Abd 1 View  07/05/2011   *RADIOLOGY REPORT*  Clinical Data: Follow up small bowel obstruction.  ABDOMEN - 1 VIEW  Comparison: 07/04/2011  Findings: Abnormal small bowel dilatation is again noted.  Small bowel loops measure up to 3.7 cm.  Mild antegrade progression of enteric contrast material is identified with a small amount of contrast within the proximal colon.  A paucity of distal colonic gas is noted.  IMPRESSION:  1.  Persistent abnormal small bowel dilatation consistent with obstruction.  Not significantly improved from previous exam.  Original Report Authenticated By: Angelita Ingles, M.D.   Dg Abd 1 View  07/04/2011  *RADIOLOGY REPORT*  Clinical Data: Nausea, mid abdominal pain, evaluate small bowel obstruction  ABDOMEN - 1 VIEW  Comparison: 07/03/2011; abdominal CT - 07/03/2011  Findings:  The previously ingested enteric contrast remains within mildly dilated loops of centralized small bowel within the mid abdomen. Index small bowel loop within left mid hemiabdomen measures approximately 3.1 cm in diameter.  There is a conspicuous paucity of distal colonic gas.  Evaluation for pneumoperitoneum is limited secondary to supine patient positioning and exclusion of the right hemidiaphragm.  No definite pneumatosis or portal venous gas. Multilevel lumbar spine degenerative change.  Post right femoral neck and shaft pinning, incompletely evaluated.  Excreted contrast within the urinary bladder.  IMPRESSION: Findings suggestive  of persistent small bowel obstruction.  Original Report Authenticated By: Rachel Moulds, M.D.   Ct Abdomen Pelvis W Contrast  07/03/2011  *RADIOLOGY REPORT*  Clinical Data: This abdominal pain.  History of Crohn's colitis.  CT ABDOMEN AND PELVIS WITH CONTRAST  Technique:  Multidetector CT imaging of the abdomen and pelvis was performed following the standard protocol during bolus administration of intravenous contrast.  Contrast: 166m OMNIPAQUE IOHEXOL 300 MG/ML IV SOLN  Comparison: None.  Findings: Small  hiatal hernia.  Contraction of the antrum may be related to peristalsis ( limiting evaluation of this region).  Dilated mid to distal jejunum and proximal and mid ileum which may be related to adhesions with a small amount of free fluid in the pelvis, left anterior pelvic region and perihepatic region.  No free intraperitoneal air.  Colon is predominately collapsed.  Mild lobulated contour of the liver.  Mild cirrhosis may be present. Focal small calcification left lobe liver otherwise no focal hepatic lesion.  No calcified gallstones.  Calcification within the spleen without focal lesion otherwise noted.  No focal pancreatic lesion.  Mild hyperplasia adrenal glands bilaterally.  No renal lesion.  Atherosclerotic type changes of the aorta with moderate to marked narrowing the lower abdominal aorta and proximal common iliac arteries.  Narrowing of the celiac artery, superior mesenteric artery and renal arteries.  Noncontrast filled views of the urinary bladder without gross abnormality.  Post hysterectomy.  Anterior wedge compression fracture T10 with retropulsion posterior inferior aspect with spinal stenosis and slight cord flattening. This appears remote.  Anterior wedge compression deformity of T12 involving the inferior endplate with mild retropulsion and spur posterior inferior aspect with mild spinal stenosis.  This appears remote.  Scoliosis and degenerative changes throughout the lower thoracic and lumbar spine.  Prior right hip fracture with placement of a dynamic sliding type screw.  The fracture at the base of the right femoral neck is still well visualized as a lucency with prominent bony overgrowth surround this level suggesting nonunion.  IMPRESSION: Small bowel obstruction as discussed above.  Mild lobulated contour of the liver.  Mild cirrhosis may be present.  Atherosclerotic type changes of the aorta with moderate to marked narrowing the lower abdominal aorta and proximal common iliac arteries.   Narrowing of the celiac artery, superior mesenteric artery and renal arteries.  Anterior wedge compression fracture T10 with retropulsion posterior inferior aspect with spinal stenosis and slight cord flattening. This appears remote.  Anterior wedge compression deformity of T12 involving the inferior endplate with mild retropulsion and spur posterior inferior aspect with mild spinal stenosis.  This appears remote.  Scoliosis and degenerative changes throughout the lower thoracic and lumbar spine.  Prior right hip fracture with placement of a dynamic sliding type screw.  The fracture at the base of the right femoral neck is still well visualized as a lucency with prominent bony overgrowth surround this level suggesting nonunion.  Original Report Authenticated By: SDoug Sou M.D.   Dg Abd Acute W/chest  07/03/2011  *RADIOLOGY REPORT*  Clinical Data: Abdomen and chest pain  ACUTE ABDOMEN SERIES (ABDOMEN 2 VIEW & CHEST 1 VIEW)  Comparison: 04/04/2011  Findings: Prior median sternotomy and CABG procedure.  Stable right upper lobe scarring.  No airspace consolidation identified.  The bowel gas pattern is abnormal.  There are dilated loops of small bowel and multiple air- fluid levels consistent with small bowel obstruction.  The small bowel loops measure up to 3.2 cm.  Scoliosis deformity involves  the lumbar spine with convexity towards the right.  IMPRESSION:  1.  Findings consistent with small bowel obstruction. 2.  Right upper lobe scarring.  Original Report Authenticated By: Angelita Ingles, M.D.   Medications:  Scheduled Meds:    . enoxaparin  40 mg Subcutaneous Q24H  . escitalopram  20 mg Oral Daily  . lisinopril  10 mg Oral Daily   And  . hydrochlorothiazide  12.5 mg Oral Daily  . metoprolol succinate  50 mg Oral Daily  . pantoprazole (PROTONIX) IV  40 mg Intravenous Q24H   Continuous Infusions:    . dextrose 5 % and 0.9 % NaCl with KCl 20 mEq/L 100 mL/hr at 07/05/11 0608  . DISCONTD:  0.9 % NaCl with KCl 20 mEq / L 75 mL/hr at 07/04/11 0738   PRN Meds:.HYDROcodone-acetaminophen, LORazepam, morphine, ondansetron (ZOFRAN) IV, ondansetron Assessment/Plan: .SBO (small bowel obstruction)  -Pt without clinical improvement and her abdominal x-ray still with persistent obstruction -NG tube placement ordered, but patient hesitant about having it placed, she'll think about it and let the nurse know-I have discussed the risks benefits-she is a retired Therapist, sports and voices understanding. -I called surgery again this a.m. for the surgical evaluation/recs being awaited since initial on 1/15 in this patient with a history of Crohn's and persitent SBO. Marland KitchenCORONARY HEART DISEASE .h/o CROHN'S DISEASE .HYPERTENSION .Other and unspecified hyperlipidemia   on home medications  except NSAIDs  Plavix also held   LOS: 2 days      Cristina Parker C 07/05/2011, 12:56 PM

## 2011-07-06 ENCOUNTER — Inpatient Hospital Stay (HOSPITAL_COMMUNITY): Payer: Medicare Other

## 2011-07-06 DIAGNOSIS — E876 Hypokalemia: Secondary | ICD-10-CM | POA: Diagnosis not present

## 2011-07-06 LAB — BASIC METABOLIC PANEL
BUN: 7 mg/dL (ref 6–23)
Chloride: 104 mEq/L (ref 96–112)
GFR calc Af Amer: >90 mL/min (ref >90)
Potassium: 3.2 mEq/L — ABNORMAL LOW (ref 3.5–5.1)

## 2011-07-06 MED ORDER — POTASSIUM CHLORIDE 10 MEQ/100ML IV SOLN
10.0000 meq | INTRAVENOUS | Status: AC
  Administered 2011-07-06 (×3): 10 meq via INTRAVENOUS
  Filled 2011-07-06 (×3): qty 100

## 2011-07-06 MED ORDER — HYDRALAZINE HCL 20 MG/ML IJ SOLN: 10.0000 mg | Freq: Four times a day (QID) | INTRAMUSCULAR | Status: DC | PRN

## 2011-07-06 NOTE — Progress Notes (Signed)
Patient ID: Cristina Parker, female   DOB: [birth year], 75 y.o.   MRN: [id number]    Subjective: Pt feels about the same.  Still with nausea.  When NGT flushed she gets water come back out of her mouth.  No NGT seen on this am's x-rays.  No flatus  Objective: Vital signs in last 24 hours: Temp:  [97.3 F (36.3 C)-98.3 F (36.8 C)] 98.1 F (36.7 C) (01/18 0556) Pulse Rate:  [75-83] 81  (01/18 0556) Resp:  [18] 18  (01/18 0556) BP: (145-150)/(76-87) 145/83 mmHg (01/18 0556) SpO2:  [93 %-97 %] 93 % (01/18 0556) Weight:  [121 lb 12.8 oz (55.248 kg)] 121 lb 12.8 oz (55.248 kg) (01/18 0556) Last BM Date: 07/05/11  Intake/Output from previous day: 01/17 0701 - 01/18 0700 In: 2400 [I.V.:2400] Out: 750 [Urine:350; Emesis/NG output:400] Intake/Output this shift: Total I/O In: -  Out: 400 [Urine:400]  PE: Abd: soft, distended, +BS, still tender across lower abdomen Ht: regular Lungs: CTAB  Lab Results:   Basename 07/05/11 0424 07/04/11 0428  WBC 10.5 12.3*  HGB 12.5 11.6*  HCT 37.2 34.7*  PLT 219 220   BMET  Basename 07/06/11 0420 07/05/11 0424  NA 136 136  K 3.2* 3.5  CL 104 104  CO2 24 24  GLUCOSE 131* 116*  BUN 7 11  CREATININE 0.52 0.60  CALCIUM 8.7 8.9   PT/INR  Basename 07/03/11 1643  LABPROT 12.5  INR 0.91     Studies/Results: Dg Abd 1 View  07/05/2011  *RADIOLOGY REPORT*  Clinical Data: Follow up small bowel obstruction.  ABDOMEN - 1 VIEW  Comparison: 07/04/2011  Findings: Abnormal small bowel dilatation is again noted.  Small bowel loops measure up to 3.7 cm.  Mild antegrade progression of enteric contrast material is identified with a small amount of contrast within the proximal colon.  A paucity of distal colonic gas is noted.  IMPRESSION:  1.  Persistent abnormal small bowel dilatation consistent with obstruction.  Not significantly improved from previous exam.  Original Report Authenticated By: Angelita Ingles, M.D.   Dg Abd 2 Views  07/06/2011    *RADIOLOGY REPORT*  Clinical Data: Follow-up small bowel obstruction.  Now with nasogastric tube in place  ABDOMEN - 2 VIEW  Comparison: 01/17 to the  Findings: The patient reportedly has a nasogastric tube in place but this tube is not visualized on this exam.  The upper right portion of the exam images to the level of the carina. Persistent small bowel loop dilatation is noted and a slight increase in caliber since the previous exam is seen with the largest loop measuring 4.6 cm in diameter.  Air fluid levels are identified on the upright film and residual contrast from the recent CT has not progressed from the distal small bowel and cecum. No distal colonic gas is evident.  Findings are compatible persistent small bowel obstruction.  The lung bases appear clear.  IMPRESSION: Reported nasogastric tube is not visualized on today's exam with imaging to the level of the carina on the upright KUB.  This suggests either removal or high positioning of the nasogastric tube.  Persistent small bowel obstructive appearance with slight interval increase in caliber of dilated small bowel loops  These results will be called to the ordering clinician or representative by the Radiologist Assistant, and communication documented in the PACS Dashboard.  Original Report Authenticated By: Ander Gaster, M.D.    Anti-infectives: Anti-infectives    None  Assessment/Plan  1. SBO  Plan:  1. NG tube has been advance to 2nd to last black dot.  Air flush heard in stomach by myself, but will get a portable 1 view to check placement. 2. Cont bowel rest and conservative management 3. PO meds should be changed to IV as they are unable to be absorbed secondary to bowel obstruction - per primary team   LOS: 3 days    Rickard Kennerly E 07/06/2011

## 2011-07-06 NOTE — Progress Notes (Signed)
I have seen and examined the patient and agree with the assessment and plans. Ng finally in stomach.  Will see how films look tomorrow  Jahnyla Parrillo A. Ninfa Linden  MD, FACS

## 2011-07-06 NOTE — Progress Notes (Signed)
Patient ID: Cristina Parker, female   DOB: October 02, 1935, 76 y.o.   MRN: [id number] Subjective: No events overnight. Patient denies chest pain, shortness of breath, abdominal pain.   Objective:  Vital signs in last 24 hours:  Filed Vitals:   07/05/11 1359 07/05/11 2133 07/06/11 0556 07/06/11 1440  BP: 150/87 146/76 145/83 125/80  Pulse: 75 83 81 81  Temp: 98.3 F (36.8 C) 97.3 F (36.3 C) 98.1 F (36.7 C) 98.7 F (37.1 C)  TempSrc: Oral Oral Oral Oral  Resp: _0 Height:      Weight:   55.248 kg (121 lb 12.8 oz)   SpO2: 97% 93% 93% 96%    Intake/Output from previous day:   Intake/Output Summary (Last 24 hours) at 07/06/11 2023 Last data filed at 07/06/11 1931  Gross per 24 hour  Intake   2869 ml  Output   1250 ml  Net   1619 ml    Physical Exam: General: Alert, awake, oriented x3, in no acute distress. HEENT: No bruits, no goiter. Moist mucous membranes, no scleral icterus, no conjunctival pallor. Heart: Regular rate and rhythm, S1/S2 +, no murmurs, rubs, gallops. Lungs: Clear to auscultation bilaterally. No wheezing, no rhonchi, no rales.  Abdomen: Soft, nontender, nondistended, positive bowel sounds. Extremities: No clubbing or cyanosis, no pitting edema,  positive pedal pulses. Neuro: Grossly nonfocal.  Lab Results:  Basic Metabolic Panel:    Component Value Date/Time   NA 136 07/06/2011 0420   K 3.2* 07/06/2011 0420   CL 104 07/06/2011 0420   CO2 24 07/06/2011 0420   BUN 7 07/06/2011 0420   CREATININE 0.52 07/06/2011 0420   CREATININE 0.75 02/13/2011 1657   GLUCOSE 131* 07/06/2011 0420   CALCIUM 8.7 07/06/2011 0420   CBC:    Component Value Date/Time   WBC 10.5 07/05/2011 0424   HGB 12.5 07/05/2011 0424   HCT 37.2 07/05/2011 0424   PLT 219 07/05/2011 0424   MCV 97.4 07/05/2011 0424   NEUTROABS 12.0* 07/03/2011 1643   LYMPHSABS 0.9 07/03/2011 1643   MONOABS 0.3 07/03/2011 1643   EOSABS 0.0 07/03/2011 1643   BASOSABS 0.0 07/03/2011 1643      Lab 07/05/11  0424 07/04/11 0428 07/03/11 1703 07/03/11 1643  WBC 10.5 12.3* -- 13.2*  HGB 12.5 11.6* 14.6 13.1  HCT 37.2 34.7* 43.0 38.7  PLT 219 220 -- 254  MCV 97.4 96.9 -- 96.8  MCH 32.7 32.4 -- 32.8  MCHC 33.6 33.4 -- 33.9  RDW 12.7 12.5 -- 12.4  LYMPHSABS -- -- -- 0.9  MONOABS -- -- -- 0.3  EOSABS -- -- -- 0.0  BASOSABS -- -- -- 0.0  BANDABS -- -- -- --    Lab 07/06/11 0420 07/05/11 0424 07/04/11 0428 07/03/11 1703  NA 136 136 133* 137  K 3.2* 3.5 3.8 3.9  CL 104 104 100 102  CO2 _1 --  GLUCOSE 131* 116* 111* 179*  BUN _2 CREATININE 0.52 0.60 0.64 0.60  CALCIUM 8.7 8.9 9.0 --  MG -- 1.9 -- --    Lab 07/03/11 1643  INR 0.91  PROTIME --   Cardiac markers: No results found for this basename: CK:3,CKMB:3,TROPONINI:3,MYOGLOBIN:3 in the last 168 hours No components found with this basename: POCBNP:3 No results found for this or any previous visit (from the past 240 hour(s)).  Studies/Results: Dg Abd 1 View  07/05/2011  *RADIOLOGY REPORT*  Clinical Data: Follow up small bowel obstruction.  ABDOMEN - 1 VIEW  Comparison: 07/04/2011  Findings: Abnormal small bowel dilatation is again noted.  Small bowel loops measure up to 3.7 cm.  Mild antegrade progression of enteric contrast material is identified with a small amount of contrast within the proximal colon.  A paucity of distal colonic gas is noted.  IMPRESSION:  1.  Persistent abnormal small bowel dilatation consistent with obstruction.  Not significantly improved from previous exam.  Original Report Authenticated By: Angelita Ingles, M.D.   Dg Abd 2 Views  07/06/2011   *RADIOLOGY REPORT*  Clinical Data: Follow-up small bowel obstruction.  Now with nasogastric tube in place  ABDOMEN - 2 VIEW  Comparison: 01/17 to the  Findings: The patient reportedly has a nasogastric tube in place but this tube is not visualized on this exam.  The upper right portion of the exam images to the level of the carina. Persistent small bowel loop  dilatation is noted and a slight increase in caliber since the previous exam is seen with the largest loop measuring 4.6 cm in diameter.  Air fluid levels are identified on the upright film and residual contrast from the recent CT has not progressed from the distal small bowel and cecum. No distal colonic gas is evident.  Findings are compatible persistent small bowel obstruction.  The lung bases appear clear.  IMPRESSION: Reported nasogastric tube is not visualized on today's exam with imaging to the level of the carina on the upright KUB.  This suggests either removal or high positioning of the nasogastric tube.  Persistent small bowel obstructive appearance with slight interval increase in caliber of dilated small bowel loops  These results will be called to the ordering clinician or representative by the Radiologist Assistant, and communication documented in the PACS Dashboard.  Original Report Authenticated By: Ander Gaster, M.D.   Dg Abd Portable 1v  07/06/2011  *RADIOLOGY REPORT*  Clinical Data: Evaluate NG tube positioning  PORTABLE ABDOMEN - 1 VIEW  Comparison: 07/06/2011  Findings: Nasogastric tube is coiled within the stomach.  The tip of the tube is oriented proximally.  Dilated loops of the small bowel are again noted.  Not significantly improved from previous exam.  Distal small bowel enteric contrast material is unchanged.  IMPRESSION:  1. Nasogastric tube tip is within the lumen of the stomach and oriented proximally.  Original Report Authenticated By: Angelita Ingles, M.D.    Medications: Scheduled Meds:   . benzocaine   Mouth/Throat Once  . enoxaparin  40 mg Subcutaneous Q24H  . escitalopram  20 mg Oral Daily  . lisinopril  10 mg Oral Daily   And  . hydrochlorothiazide  12.5 mg Oral Daily  . metoprolol succinate  50 mg Oral Daily  . pantoprazole (PROTONIX) IV  40 mg Intravenous Q24H  . potassium chloride  10 mEq Intravenous Q1 Hr x 3   Continuous Infusions:   . dextrose  5 % and 0.9 % NaCl with KCl 20 mEq/L 100 mL/hr at 07/06/11 1931   PRN Meds:.hydrALAZINE, HYDROcodone-acetaminophen, LORazepam, menthol-cetylpyridinium, morphine, ondansetron (ZOFRAN) IV, ondansetron, phenol  Assessment/Plan:  Principal Problem:   *SBO (small bowel obstruction) - NG tube in place - as per surgery we will continue conservative management - patient reports minimal nausea but no vomiting  Hypokalemia - likely secondary to NG tube/suction - replete - BMP in am    EDUCATION - test results and diagnostic studies were discussed with patient  at the bedside - patient has verbalized the understanding -  questions were answered at the bedside and contact information was provided for additional questions or concerns   LOS: 3 days   Sway Guttierrez 07/06/2011, 8:23 PM  [facility] Pager: [phone]

## 2011-07-07 ENCOUNTER — Other Ambulatory Visit: Payer: Self-pay

## 2011-07-07 ENCOUNTER — Inpatient Hospital Stay (HOSPITAL_COMMUNITY): Payer: Medicare Other

## 2011-07-07 LAB — CBC
HCT: 33.5 % — ABNORMAL LOW (ref 36.0–46.0)
Hemoglobin: 11.4 g/dL — ABNORMAL LOW (ref 12.0–15.0)
MCHC: 34 g/dL (ref 30.0–36.0)
WBC: 8.4 10*3/uL (ref 4.0–10.5)

## 2011-07-07 LAB — BASIC METABOLIC PANEL
BUN: 7 mg/dL (ref 6–23)
Chloride: 101 mEq/L (ref 96–112)
Glucose, Bld: 115 mg/dL — ABNORMAL HIGH (ref 70–99)
Potassium: 3.5 mEq/L (ref 3.5–5.1)

## 2011-07-07 NOTE — Progress Notes (Signed)
Patient ID: Cristina Parker, female   DOB: 06-Dec-1935, 76 y.o.   MRN: [id number] [facility] Surgery Progress Note:   * No surgery found *  Subjective: NG with scant green drainage after replacement.  Reports no flatus but small constipated bowel movement.   Objective: Vital signs in last 24 hours: Temp:  [97.3 F (36.3 C)-98.7 F (37.1 C)] 97.3 F (36.3 C) (01/19 0500) Pulse Rate:  [77-95] 95  (01/19 0500) Resp:  [18] 18  (01/19 0500) BP: (96-125)/(70-80) 96/70 mmHg (01/19 0500) SpO2:  [95 %-96 %] 95 % (01/19 0500) Weight:  [126 lb 5.2 oz (57.3 kg)] 126 lb 5.2 oz (57.3 kg) (01/19 0500)  Intake/Output from previous day: 2022/07/06 0701 - 01/19 0700 In: 1669 [I.V.:1455; IV Piggyback:214] Out: 1150 [Urine:800; Emesis/NG output:350] Intake/Output this shift: Total I/O In: 1210 [I.V.:1210] Out: 150 [Emesis/NG output:150]  Physical Exam:  Abdomen is soft.  BS are present.  No rebound or guarding Lab Results:   Basename 07/07/11 0432 07/05/11 0424  WBC 8.4 10.5  HGB 11.4* 12.5  HCT 33.5* 37.2  PLT 182 219   BMET  Basename 07/07/11 0432 07-Jul-2011 0420  NA 134* 136  K 3.5 3.2*  CL 101 104  CO2 27 24  GLUCOSE 115* 131*  BUN 7 7  CREATININE 0.58 0.52  CALCIUM 8.5 8.7   PT/INR No results found for this basename: LABPROT:2,INR:2 in the last 72 hours Studies/Results: Dg Abd 2 Views  07/07/2011   *RADIOLOGY REPORT*  Clinical Data: Follow-up small bowel obstruction.  Now with nasogastric tube in place  ABDOMEN - 2 VIEW  Comparison: 01/17 to the  Findings: The patient reportedly has a nasogastric tube in place but this tube is not visualized on this exam.  The upper right portion of the exam images to the level of the carina. Persistent small bowel loop dilatation is noted and a slight increase in caliber since the previous exam is seen with the largest loop measuring 4.6 cm in diameter.  Air fluid levels are identified on the upright film and residual contrast from the recent CT has  not progressed from the distal small bowel and cecum. No distal colonic gas is evident.  Findings are compatible persistent small bowel obstruction.  The lung bases appear clear.  IMPRESSION: Reported nasogastric tube is not visualized on today's exam with imaging to the level of the carina on the upright KUB.  This suggests either removal or high positioning of the nasogastric tube.  Persistent small bowel obstructive appearance with slight interval increase in caliber of dilated small bowel loops  These results will be called to the ordering clinician or representative by the Radiologist Assistant, and communication documented in the PACS Dashboard.  Original Report Authenticated By: Ander Gaster, M.D.   Dg Abd Portable 1v  07-07-11  *RADIOLOGY REPORT*  Clinical Data: Evaluate NG tube positioning  PORTABLE ABDOMEN - 1 VIEW  Comparison: 07-07-2011  Findings: Nasogastric tube is coiled within the stomach.  The tip of the tube is oriented proximally.  Dilated loops of the small bowel are again noted.  Not significantly improved from previous exam.  Distal small bowel enteric contrast material is unchanged.  IMPRESSION:  1. Nasogastric tube tip is within the lumen of the stomach and oriented proximally.  Original Report Authenticated By: Angelita Ingles, M.D.   Anti-infectives: Anti-infectives    None      Assessment/Plan: Problem List: Patient Active Problem List  Diagnoses  . Other and unspecified hyperlipidemia  .  HYPERTENSION  . CORONARY HEART DISEASE  . VENTRICULAR ARRHYTHMIA  . PVD  . PNEUMONIA, ORGANISM UNSPECIFIED  . BRONCHITIS  . LUNG NODULE  . G E R D  . GASTRITIS  . OTHER OBSTRUCTION OF DUODENUM  . HIATAL HERNIA  . CROHN'S DISEASE  . OTHER ULCERATIVE COLITIS  . DIARRHEA  . CAROTID ENDARTERECTOMY, LEFT, HX OF  . SBO (small bowel obstruction)  . COPD (chronic obstructive pulmonary disease)  . Hypokalemia    X ray yesterday showed distal small bowel contrast  presumeably from CT scan.  NG repositioned yesterday.  Xrays are pending today.  May be getting near exploratory laparotomy for this despite her multiple comorbid conditions.  * No surgery found *    LOS: 4 days   Matt B. Hassell Done, MD, [facility] [phone] beeper [phone]  07/07/2011 8:33 AM

## 2011-07-07 NOTE — Progress Notes (Signed)
Patient with elevated heart rate 118, patient denies any pain but C/o of some anxiety, PRN ativan given. Cristina Parker-NP notified and also informed of the EKG result, no new order given. Will continue to assess patient.

## 2011-07-07 NOTE — Progress Notes (Signed)
PATIENT DETAILS Name: Cristina Parker Age: 76 y.o. Sex: female Date of Birth: Nov 10, 1935 Admit Date: 07/03/2011 AJL:[email or web address], MD, MD Emergency contact:   CONSULTS: 1.  Dr. Johnathan Hausen, Surgery  Interval History: Cristina Parker is a 76 year old female who was admitted with a SBO on 07/03/11.  She has been followed by surgery.  She is being treated conservatively with NG decompression and bowel rest.  She has not significantly improved, so may ultimately require exploratory laparotomy.    ROS: No flatus, small constipated BM, ongoing nausea but no vomiting.  NG tube draining bilious material.  Abdomen sore.   Objective: Vital signs in last 24 hours: Temp:  [97.3 F (36.3 C)-98.7 F (37.1 C)] 97.3 F (36.3 C) July 19, 2022 0500) Pulse Rate:  [77-95] 95  07-19-2022 0500) Resp:  [18] 18  07-19-2022 0500) BP: (96-125)/(70-80) 96/70 mmHg 07-19-22 0500) SpO2:  [95 %-96 %] 95 % 19-Jul-2022 0500) Weight:  [57.3 kg (126 lb 5.2 oz)] 57.3 kg (126 lb 5.2 oz) 2022-07-19 0500) Weight change: 2.052 kg (4 lb 8.4 oz) Last BM Date: 07/05/11  Intake/Output from previous day:  Intake/Output Summary (Last 24 hours) at 07/20/11 1141 Last data filed at 2011-07-20 0906  Gross per 24 hour  Intake   2879 ml  Output   1700 ml  Net   1179 ml     Physical Exam:  Gen:  NAD Cardiovascular:  RRR, No M/R/G Respiratory: Lungs CTAB Gastrointestinal: Abdomen slightly distended, tender.  Diminished bowel sounds. Extremities: No C/E/C     Lab Results: Basic Metabolic Panel:  Lab 39/43/20 0432 07/06/11 0420 07/05/11 0424 07/04/11 0428 07/03/11 1703  NA 134* 136 136 133* 137  K 3.5 3.2* -- -- --  CL 101 104 104 100 102  CO2 _0 --  GLUCOSE 115* 131* 116* 111* 179*  BUN _1 CREATININE 0.58 0.52 0.60 0.64 0.60  CALCIUM 8.5 8.7 8.9 9.0 --  MG -- -- 1.9 -- --  PHOS -- -- -- -- --   GFR Estimated Creatinine Clearance: 48.9 ml/min (by C-G formula based on Cr of 0.58). Liver Function Tests:  Lab  07/05/11 0424 07/03/11 1643  AST 16 22  ALT 10 16  ALKPHOS 112 173*  BILITOT 0.4 0.5  PROT 5.8* 7.7  ALBUMIN 3.1* 4.3    Lab 07/03/11 1643  LIPASE 49  AMYLASE --   Coagulation profile  Lab 07/03/11 1643  INR 0.91  PROTIME --    CBC:  Lab 07/20/11 0432 07/05/11 0424 07/04/11 0428 07/03/11 1703 07/03/11 1643  WBC 8.4 10.5 12.3* -- 13.2*  NEUTROABS -- -- -- -- 12.0*  HGB 11.4* 12.5 11.6* 14.6 13.1  HCT 33.5* 37.2 34.7* 43.0 38.7  MCV 97.7 97.4 96.9 -- 96.8  PLT 182 219 220 -- 254   Microbiology No results found for this or any previous visit (from the past 240 hour(s)).  No results found for this or any previous visit (from the past 240 hour(s)).  Studies/Results: Dg Abd 2 Views  07/20/11  *RADIOLOGY REPORT*  Clinical Data: Abdominal pain, follow-up small bowel obstruction  ABDOMEN - 2 VIEW  Comparison: 07/06/2011  Findings: Again seen are dilated loops of small bowel in the mid abdomen, compatible with small bowel obstruction.  Contrast has progressed into the cecum.  Enteric tube looped in the gastric cardia.  Heart is top normal in size. Postsurgical changes related to prior CABG.  Degenerative changes of the visualized thoracolumbar spine.  IMPRESSION: Findings compatible with small bowel obstruction.  Contrast has progressed into the cecum.  Original Report Authenticated By: Julian Hy, M.D.   Dg Abd 2 Views  07/06/2011   *RADIOLOGY REPORT*  Clinical Data: Follow-up small bowel obstruction.  Now with nasogastric tube in place  ABDOMEN - 2 VIEW  Comparison: 01/17 to the  Findings: The patient reportedly has a nasogastric tube in place but this tube is not visualized on this exam.  The upper right portion of the exam images to the level of the carina. Persistent small bowel loop dilatation is noted and a slight increase in caliber since the previous exam is seen with the largest loop measuring 4.6 cm in diameter.  Air fluid levels are identified on the upright film and  residual contrast from the recent CT has not progressed from the distal small bowel and cecum. No distal colonic gas is evident.  Findings are compatible persistent small bowel obstruction.  The lung bases appear clear.  IMPRESSION: Reported nasogastric tube is not visualized on today's exam with imaging to the level of the carina on the upright KUB.  This suggests either removal or high positioning of the nasogastric tube.  Persistent small bowel obstructive appearance with slight interval increase in caliber of dilated small bowel loops  These results will be called to the ordering clinician or representative by the Radiologist Assistant, and communication documented in the PACS Dashboard.  Original Report Authenticated By: Ander Gaster, M.D.   Dg Abd Portable 1v  07/06/2011  *RADIOLOGY REPORT*  Clinical Data: Evaluate NG tube positioning  PORTABLE ABDOMEN - 1 VIEW  Comparison: 07/06/2011  Findings: Nasogastric tube is coiled within the stomach.  The tip of the tube is oriented proximally.  Dilated loops of the small bowel are again noted.  Not significantly improved from previous exam.  Distal small bowel enteric contrast material is unchanged.  IMPRESSION:  1. Nasogastric tube tip is within the lumen of the stomach and oriented proximally.  Original Report Authenticated By: Angelita Ingles, M.D.    Medications: Scheduled Meds:    . benzocaine   Mouth/Throat Once  . enoxaparin  40 mg Subcutaneous Q24H  . escitalopram  20 mg Oral Daily  . lisinopril  10 mg Oral Daily   And  . hydrochlorothiazide  12.5 mg Oral Daily  . metoprolol succinate  50 mg Oral Daily  . pantoprazole (PROTONIX) IV  40 mg Intravenous Q24H  . potassium chloride  10 mEq Intravenous Q1 Hr x 3   Continuous Infusions:    . dextrose 5 % and 0.9 % NaCl with KCl 20 mEq/L 100 mL/hr at 07/07/11 0450   PRN Meds:.hydrALAZINE, HYDROcodone-acetaminophen, LORazepam, menthol-cetylpyridinium, morphine, ondansetron (ZOFRAN) IV,  ondansetron, phenol Antibiotics: Anti-infectives    None       Assessment/Plan:  Principal Problem:  *SBO (small bowel obstruction) The patient was admitted and treated conservatively with bowel rest and NG decompression.  She was given anti-emetics for nausea.  Serial abdominal films have not shown resolution, so the patient will likely require exploratory laparotomy. Active Problems:  HYPERTENSION Will d/c oral meds since she has an NG tube to suction.  Continue hydralazine PRN.  COPD (chronic obstructive pulmonary disease) Respiratory status stable.    Hypokalemia The patient's potassium has been replaced.  CKD (chronic kidney disease) stage 3, GFR 30-59 ml/min Renal function stable.  Creatinine 0.57 04/12/11.    LOS: 4 days   Jacquelynn Cree, MD Pager [phone]  07/07/2011, 11:41 AM

## 2011-07-08 ENCOUNTER — Inpatient Hospital Stay (HOSPITAL_COMMUNITY): Payer: Medicare Other

## 2011-07-08 ENCOUNTER — Other Ambulatory Visit: Payer: Self-pay

## 2011-07-08 LAB — BASIC METABOLIC PANEL
BUN: 5 mg/dL — ABNORMAL LOW (ref 6–23)
Calcium: 8.4 mg/dL (ref 8.4–10.5)
GFR calc non Af Amer: >90 mL/min (ref >90)
Glucose, Bld: 121 mg/dL — ABNORMAL HIGH (ref 70–99)

## 2011-07-08 LAB — CBC
HCT: 31.4 % — ABNORMAL LOW (ref 36.0–46.0)
Hemoglobin: 10.5 g/dL — ABNORMAL LOW (ref 12.0–15.0)
MCH: 32.5 pg (ref 26.0–34.0)
MCHC: 33.4 g/dL (ref 30.0–36.0)
MCV: 97.2 fL (ref 78.0–100.0)

## 2011-07-08 MED ORDER — METOPROLOL TARTRATE 1 MG/ML IV SOLN
5.0000 mg | Freq: Once | INTRAVENOUS | Status: AC
  Administered 2011-07-08: 5 mg via INTRAVENOUS
  Filled 2011-07-08: qty 5

## 2011-07-08 MED ORDER — PROMETHAZINE HCL 25 MG/ML IJ SOLN
12.5000 mg | Freq: Four times a day (QID) | INTRAMUSCULAR | Status: DC | PRN
  Administered 2011-07-08: 12.5 mg via INTRAVENOUS
  Filled 2011-07-08: qty 1

## 2011-07-08 MED ORDER — VITAMINS A & D EX OINT
TOPICAL_OINTMENT | CUTANEOUS | Status: AC
  Administered 2011-07-08: 5
  Filled 2011-07-08: qty 5

## 2011-07-08 MED ORDER — METOPROLOL TARTRATE 1 MG/ML IV SOLN
5.0000 mg | Freq: Four times a day (QID) | INTRAVENOUS | Status: DC
  Administered 2011-07-08 – 2011-07-10 (×9): 5 mg via INTRAVENOUS
  Filled 2011-07-08 (×14): qty 5

## 2011-07-08 MED ORDER — POTASSIUM CHLORIDE 10 MEQ/100ML IV SOLN
10.0000 meq | INTRAVENOUS | Status: AC
  Administered 2011-07-08 (×5): 10 meq via INTRAVENOUS
  Filled 2011-07-08 (×5): qty 100

## 2011-07-08 NOTE — Progress Notes (Signed)
Patient ID: Cristina Parker, female   DOB: [birth year], 76 y.o.   MRN: [id number] [facility] Surgery Progress Note:   * No surgery found *  Subjective: Mental status is clear.  Just had small soft BM. Xray yesterday showed contrast in right colon.   Objective: Vital signs in last 24 hours: Temp:  [98.5 F (36.9 C)-99.5 F (37.5 C)] 98.5 F (36.9 C) (01/20 0536) Pulse Rate:  [105-200] 200  (01/20 0536) Resp:  [16-20] 20  (01/20 0536) BP: (111-143)/(78-91) 129/91 mmHg (01/20 0536) SpO2:  [96 %-99 %] 99 % (01/20 0536) Weight:  [129 lb 6.6 oz (58.7 kg)] 129 lb 6.6 oz (58.7 kg) (01/20 0536)  Intake/Output from previous day: 01/19 0701 - 01/20 0700 In: 3510 [I.V.:3510] Out: 2900 [Urine:2150; Emesis/NG output:750] Intake/Output this shift: Total I/O In: -  Out: 250 [Urine:250]  Physical Exam: Work of breathing is  Normal.  NG in place.  Abdomen is soft and less uncomfortable.    Lab Results:  Results for orders placed during the hospital encounter of 07/03/11 (from the past 48 hour(s))  CBC     Status: Abnormal   Collection Time   07/07/11  4:32 AM      Component Value Range Comment   WBC 8.4  4.0 - 10.5 (K/uL)    RBC 3.43 (*) 3.87 - 5.11 (MIL/uL)    Hemoglobin 11.4 (*) 12.0 - 15.0 (g/dL)    HCT 33.5 (*) 36.0 - 46.0 (%)    MCV 97.7  78.0 - 100.0 (fL)    MCH 33.2  26.0 - 34.0 (pg)    MCHC 34.0  30.0 - 36.0 (g/dL)    RDW 12.5  11.5 - 15.5 (%)    Platelets 182  150 - 400 (K/uL)   BASIC METABOLIC PANEL     Status: Abnormal   Collection Time   07/07/11  4:32 AM      Component Value Range Comment   Sodium 134 (*) 135 - 145 (mEq/L)    Potassium 3.5  3.5 - 5.1 (mEq/L)    Chloride 101  96 - 112 (mEq/L)    CO2 27  19 - 32 (mEq/L)    Glucose, Bld 115 (*) 70 - 99 (mg/dL)    BUN 7  6 - 23 (mg/dL)    Creatinine, Ser 0.58  0.50 - 1.10 (mg/dL)    Calcium 8.5  8.4 - 10.5 (mg/dL)    GFR calc non Af Amer 89 (*) >90 (mL/min)    GFR calc Af Amer >90  >90 (mL/min)   CBC     Status:  Abnormal   Collection Time   07/08/11  5:00 AM      Component Value Range Comment   WBC 8.8  4.0 - 10.5 (K/uL)    RBC 3.23 (*) 3.87 - 5.11 (MIL/uL)    Hemoglobin 10.5 (*) 12.0 - 15.0 (g/dL)    HCT 31.4 (*) 36.0 - 46.0 (%)    MCV 97.2  78.0 - 100.0 (fL)    MCH 32.5  26.0 - 34.0 (pg)    MCHC 33.4  30.0 - 36.0 (g/dL)    RDW 12.5  11.5 - 15.5 (%)    Platelets 190  150 - 400 (K/uL)   BASIC METABOLIC PANEL     Status: Abnormal   Collection Time   07/08/11  5:00 AM      Component Value Range Comment   Sodium 138  135 - 145 (mEq/L)    Potassium 3.0 (*)  3.5 - 5.1 (mEq/L)    Chloride 102  96 - 112 (mEq/L)    CO2 27  19 - 32 (mEq/L)    Glucose, Bld 121 (*) 70 - 99 (mg/dL)    BUN 5 (*) 6 - 23 (mg/dL)    Creatinine, Ser 0.54  0.50 - 1.10 (mg/dL)    Calcium 8.4  8.4 - 10.5 (mg/dL)    GFR calc non Af Amer >90  >90 (mL/min)    GFR calc Af Amer >90  >90 (mL/min)     Radiology/Results: Dg Abd 2 Views  07/07/2011  *RADIOLOGY REPORT*  Clinical Data: Abdominal pain, follow-up small bowel obstruction  ABDOMEN - 2 VIEW  Comparison: 07/06/2011  Findings: Again seen are dilated loops of small bowel in the mid abdomen, compatible with small bowel obstruction.  Contrast has progressed into the cecum.  Enteric tube looped in the gastric cardia.  Heart is top normal in size. Postsurgical changes related to prior CABG.  Degenerative changes of the visualized thoracolumbar spine.  IMPRESSION: Findings compatible with small bowel obstruction.  Contrast has progressed into the cecum.  Original Report Authenticated By: Julian Hy, M.D.   Dg Abd 2 Views  07/06/2011   *RADIOLOGY REPORT*  Clinical Data: Follow-up small bowel obstruction.  Now with nasogastric tube in place  ABDOMEN - 2 VIEW  Comparison: 01/17 to the  Findings: The patient reportedly has a nasogastric tube in place but this tube is not visualized on this exam.  The upper right portion of the exam images to the level of the carina. Persistent small  bowel loop dilatation is noted and a slight increase in caliber since the previous exam is seen with the largest loop measuring 4.6 cm in diameter.  Air fluid levels are identified on the upright film and residual contrast from the recent CT has not progressed from the distal small bowel and cecum. No distal colonic gas is evident.  Findings are compatible persistent small bowel obstruction.  The lung bases appear clear.  IMPRESSION: Reported nasogastric tube is not visualized on today's exam with imaging to the level of the carina on the upright KUB.  This suggests either removal or high positioning of the nasogastric tube.  Persistent small bowel obstructive appearance with slight interval increase in caliber of dilated small bowel loops  These results will be called to the ordering clinician or representative by the Radiologist Assistant, and communication documented in the PACS Dashboard.  Original Report Authenticated By: Ander Gaster, M.D.   Dg Abd Portable 1v  07/06/2011  *RADIOLOGY REPORT*  Clinical Data: Evaluate NG tube positioning  PORTABLE ABDOMEN - 1 VIEW  Comparison: 07/06/2011  Findings: Nasogastric tube is coiled within the stomach.  The tip of the tube is oriented proximally.  Dilated loops of the small bowel are again noted.  Not significantly improved from previous exam.  Distal small bowel enteric contrast material is unchanged.  IMPRESSION:  1. Nasogastric tube tip is within the lumen of the stomach and oriented proximally.  Original Report Authenticated By: Angelita Ingles, M.D.    Anti-infectives: Anti-infectives    None      Assessment/Plan: Problem List: Patient Active Problem List  Diagnoses  . Other and unspecified hyperlipidemia  . HYPERTENSION  . CORONARY HEART DISEASE  . VENTRICULAR ARRHYTHMIA  . PVD  . PNEUMONIA, ORGANISM UNSPECIFIED  . BRONCHITIS  . LUNG NODULE  . G E R D  . GASTRITIS  . OTHER OBSTRUCTION OF DUODENUM  . HIATAL  HERNIA  . CROHN'S  DISEASE  . OTHER ULCERATIVE COLITIS  . DIARRHEA  . CAROTID ENDARTERECTOMY, LEFT, HX OF  . SBO (small bowel obstruction)  . COPD (chronic obstructive pulmonary disease)  . Hypokalemia  . CKD (chronic kidney disease) stage 3, GFR 30-59 ml/min    Awaiting xray this am.  Possible Crohn's stricture?   * No surgery found *    LOS: 5 days   Matt B. Hassell Done, MD, [facility] [phone] beeper [phone]  07/08/2011 8:28 AM

## 2011-07-08 NOTE — Progress Notes (Signed)
PATIENT DETAILS Name: Cristina Parker Age: 76 y.o. Sex: female Date of Birth: 09/20/1935 Admit Date: 07/03/2011 KBT:[id number], MD, MD Emergency contact:   CONSULTS: 1.  Dr. Johnathan Hausen, Surgery  Interval History: Cristina Parker is a 76 year old female who was admitted with a SBO on 07/03/11.  She has been followed by surgery.  She is being treated conservatively with NG decompression and bowel rest.  She has not significantly improved, so may ultimately require exploratory laparotomy.    ROS: Cristina Parker thinks her abdomen is less sore.  No N/V.  Says she passed some flatus and stool this morning.   Objective: Vital signs in last 24 hours: Temp:  [98.5 F (36.9 C)-99.5 F (37.5 C)] 98.5 F (36.9 C) 07-20-22 0536) Pulse Rate:  [89-200] 89  07/20/22 1008) Resp:  [16-20] 20  2022-07-20 0536) BP: (111-143)/(78-91) 129/91 mmHg 07-20-2022 0536) SpO2:  [92 %-99 %] 98 % 07/20/2022 1008) Weight:  [58.7 kg (129 lb 6.6 oz)] 58.7 kg (129 lb 6.6 oz) 07-20-22 0536) Weight change: 1.4 kg (3 lb 1.4 oz) Last BM Date: 07/05/11  Intake/Output from previous day:  Intake/Output Summary (Last 24 hours) at 21-Jul-2011 1023 Last data filed at 2011-07-21 0824  Gross per 24 hour  Intake   2300 ml  Output   2025 ml  Net    275 ml     Physical Exam:  Gen:  NAD Cardiovascular:  RRR, No M/R/G Respiratory: Lungs CTAB Gastrointestinal: Abdomen slightly distended, less tender.  Diminished bowel sounds. Extremities: No C/E/C     Lab Results: Basic Metabolic Panel:  Lab 62/44/69 0500 07/07/11 0432 07/06/11 0420 07/05/11 0424 07/04/11 0428  NA 138 134* 136 136 133*  K 3.0* 3.5 -- -- --  CL 102 101 104 104 100  CO2 _0 GLUCOSE 121* 115* 131* 116* 111*  BUN 5* _1 CREATININE 0.54 0.58 0.52 0.60 0.64  CALCIUM 8.4 8.5 8.7 8.9 9.0  MG -- -- -- 1.9 --  PHOS -- -- -- -- --   GFR Estimated Creatinine Clearance: 49.5 ml/min (by C-G formula based on Cr of 0.54). Liver Function Tests:  Lab  07/05/11 0424 07/03/11 1643  AST 16 22  ALT 10 16  ALKPHOS 112 173*  BILITOT 0.4 0.5  PROT 5.8* 7.7  ALBUMIN 3.1* 4.3    Lab 07/03/11 1643  LIPASE 49  AMYLASE --   Coagulation profile  Lab 07/03/11 1643  INR 0.91  PROTIME --    CBC:  Lab 2011/07/21 0500 07/07/11 0432 07/05/11 0424 07/04/11 0428 07/03/11 1703 07/03/11 1643  WBC 8.8 8.4 10.5 12.3* -- 13.2*  NEUTROABS -- -- -- -- -- 12.0*  HGB 10.5* 11.4* 12.5 11.6* 14.6 --  HCT 31.4* 33.5* 37.2 34.7* 43.0 --  MCV 97.2 97.7 97.4 96.9 -- 96.8  PLT 190 182 219 220 -- 254   Microbiology No results found for this or any previous visit (from the past 240 hour(s)).  No results found for this or any previous visit (from the past 240 hour(s)).  Studies/Results: Dg Abd 2 Views  2011/07/21  *RADIOLOGY REPORT*  Clinical Data: Follow up SBO  ABDOMEN - 2 VIEW  Comparison: 07/07/2011  Findings: Mildly dilated loops of small bowel in the right mid abdomen, compatible with small bowel obstruction, grossly unchanged.  Small amount of residual contrast in the cecum.  Enteric tube looped in the stomach.  Degenerate changes of the visualized thoracolumbar spine.  ORIF  of the proximal right femur.  IMPRESSION: Stable small bowel obstruction.  Original Report Authenticated By: Julian Hy, M.D.   Dg Abd 2 Views  07/07/2011  *RADIOLOGY REPORT*  Clinical Data: Abdominal pain, follow-up small bowel obstruction  ABDOMEN - 2 VIEW  Comparison: 07/06/2011  Findings: Again seen are dilated loops of small bowel in the mid abdomen, compatible with small bowel obstruction.  Contrast has progressed into the cecum.  Enteric tube looped in the gastric cardia.  Heart is top normal in size. Postsurgical changes related to prior CABG.  Degenerative changes of the visualized thoracolumbar spine.  IMPRESSION: Findings compatible with small bowel obstruction.  Contrast has progressed into the cecum.  Original Report Authenticated By: Julian Hy, M.D.     Medications: Scheduled Meds:    . benzocaine   Mouth/Throat Once  . metoprolol  5 mg Intravenous Once  . pantoprazole (PROTONIX) IV  40 mg Intravenous Q24H  . vitamin A & D      . DISCONTD: enoxaparin  40 mg Subcutaneous Q24H  . DISCONTD: escitalopram  20 mg Oral Daily  . DISCONTD: hydrochlorothiazide  12.5 mg Oral Daily  . DISCONTD: lisinopril  10 mg Oral Daily  . DISCONTD: metoprolol succinate  50 mg Oral Daily   Continuous Infusions:    . dextrose 5 % and 0.9 % NaCl with KCl 20 mEq/L 100 mL/hr at 07/08/11 1001   PRN Meds:.hydrALAZINE, HYDROcodone-acetaminophen, LORazepam, menthol-cetylpyridinium, morphine, ondansetron (ZOFRAN) IV, ondansetron, phenol, promethazine Antibiotics: Anti-infectives    None       Assessment/Plan:  Principal Problem:  *SBO (small bowel obstruction) The patient was admitted and treated conservatively with bowel rest and NG decompression.  She was given anti-emetics for nausea.  Serial abdominal films have not shown resolution, including the film done today, so the patient may require exploratory laparotomy, although she is clinically improving. Active Problems:  HYPERTENSION Will d/c oral meds since she has an NG tube to suction.  Given tachycardia, will start on routine metoprolol.  Continue hydralazine PRN.  COPD (chronic obstructive pulmonary disease) Respiratory status stable.    Hypokalemia The patient's potassium is low today.  Will replace.  CKD (chronic kidney disease) stage 3, GFR 30-59 ml/min Renal function stable.  Creatinine 0.57 04/12/11.    LOS: 5 days   Jacquelynn Cree, MD Pager [phone]  07/08/2011, 10:23 AM

## 2011-07-08 NOTE — Progress Notes (Signed)
Dr. Rockne Menghini notified that EKG shows "ST with possible PACs with aberrant conduction and PVCs or fusion complexes"

## 2011-07-08 NOTE — Progress Notes (Signed)
Patient Heart rate continue to be elevated between 120-160, patient denies any chest pain but anxious, notified Cristina Parker, Placed on heart monitor and IV lopressor given as ordered, after giving Lopressor HR between 95-100. Will continue to assess patient. Day shift RN will be notified.

## 2011-07-08 NOTE — Progress Notes (Signed)
Patient's telemetry strips show periods of rapid a-fib with heart rate of 199-222.  Patient asymptomatic.  Current HR is 88.  All episodes seem to coincide with patient being up to [facility].  Orders received.

## 2011-07-09 ENCOUNTER — Inpatient Hospital Stay (HOSPITAL_COMMUNITY): Payer: Medicare Other

## 2011-07-09 ENCOUNTER — Encounter (HOSPITAL_COMMUNITY): Payer: Self-pay | Admitting: Internal Medicine

## 2011-07-09 DIAGNOSIS — I480 Paroxysmal atrial fibrillation: Secondary | ICD-10-CM

## 2011-07-09 HISTORY — DX: Paroxysmal atrial fibrillation: I48.0

## 2011-07-09 LAB — CBC
HCT: 32.5 % — ABNORMAL LOW (ref 36.0–46.0)
MCH: 32.6 pg (ref 26.0–34.0)
MCHC: 33.2 g/dL (ref 30.0–36.0)
MCV: 98.2 fL (ref 78.0–100.0)
RDW: 12.5 % (ref 11.5–15.5)
WBC: 10.6 10*3/uL — ABNORMAL HIGH (ref 4.0–10.5)

## 2011-07-09 LAB — BASIC METABOLIC PANEL
BUN: 7 mg/dL (ref 6–23)
Chloride: 102 mEq/L (ref 96–112)
Creatinine, Ser: 0.63 mg/dL (ref 0.50–1.10)
GFR calc Af Amer: >90 mL/min (ref >90)

## 2011-07-09 MED ORDER — VITAMINS A & D EX OINT
TOPICAL_OINTMENT | CUTANEOUS | Status: AC
  Administered 2011-07-09: 12:00:00
  Filled 2011-07-09: qty 5

## 2011-07-09 NOTE — Progress Notes (Addendum)
PATIENT DETAILS Name: Cristina Parker Age: 76 y.o. Sex: female Date of Birth: 10-Nov-1935 Admit Date: 07/03/2011 IOC:[email or web address],[email or web address], MD, MD Emergency contact:   CONSULTS: 1.  Dr. Johnathan Hausen, Surgery  Interval History: Cristina Parker is a 76 year old female who was admitted with a SBO on 07/03/11.  She has been followed by surgery.  She is being treated conservatively with NG decompression and bowel rest.  She has not significantly improved, so may ultimately require exploratory laparotomy.    ROS: Cristina Parker had 3 liquid BMs yesterday.  The nausea has improved.  Her nose is sore from the NG tube.  Objective: Vital signs in last 24 hours: Temp:  [97.9 F (36.6 C)-99 F (37.2 C)] 99 F (37.2 C) 2022-07-20 0550) Pulse Rate:  [87-200] 90  07/20/2022 0550) Resp:  [18-20] 20  20-Jul-2022 0550) BP: (103-163)/(69-98) 103/69 mmHg 07/20/22 0550) SpO2:  [97 %-100 %] 97 % 07/20/2022 0550) Weight:  [55.5 kg (122 lb 5.7 oz)] 55.5 kg (122 lb 5.7 oz) 07-20-2022 0550) Weight change: -3.2 kg (-7 lb 0.9 oz) Last BM Date: 07/08/11  Intake/Output from previous day:  Intake/Output Summary (Last 24 hours) at Jul 21, 2011 1048 Last data filed at 07/21/2011 0745  Gross per 24 hour  Intake   2510 ml  Output   1200 ml  Net   1310 ml     Physical Exam:  Gen:  NAD Cardiovascular:  RRR, No M/R/G Respiratory: Lungs CTAB Gastrointestinal: Abdomen slightly distended, less tender.  Diminished bowel sounds. Extremities: No C/E/C     Lab Results: Basic Metabolic Panel:  Lab 66/66/48 0454 07/08/11 0500 07/07/11 0432 07/06/11 0420 07/05/11 0424  NA 138 138 134* 136 136  K 3.7 3.0* -- -- --  CL 102 102 101 104 104  CO2 _0 GLUCOSE 109* 121* 115* 131* 116*  BUN 7 5* _1 CREATININE 0.63 0.54 0.58 0.52 0.60  CALCIUM 8.6 8.4 8.5 8.7 8.9  MG 1.6 -- -- -- 1.9  PHOS -- -- -- -- --   GFR Estimated Creatinine Clearance: 48.2 ml/min (by C-G formula based on Cr of 0.63). Liver Function Tests:  Lab 07/05/11  0424 07/03/11 1643  AST 16 22  ALT 10 16  ALKPHOS 112 173*  BILITOT 0.4 0.5  PROT 5.8* 7.7  ALBUMIN 3.1* 4.3    Lab 07/03/11 1643  LIPASE 49  AMYLASE --   Coagulation profile  Lab 07/03/11 1643  INR 0.91  PROTIME --    CBC:  Lab 2011/07/21 0454 07/08/11 0500 07/07/11 0432 07/05/11 0424 07/04/11 0428 07/03/11 1643  WBC 10.6* 8.8 8.4 10.5 12.3* --  NEUTROABS -- -- -- -- -- 12.0*  HGB 10.8* 10.5* 11.4* 12.5 11.6* --  HCT 32.5* 31.4* 33.5* 37.2 34.7* --  MCV 98.2 97.2 97.7 97.4 96.9 --  PLT 180 190 182 219 220 --   Microbiology No results found for this or any previous visit (from the past 240 hour(s)).  Studies/Results: Dg Abd 2 Views  07-21-2011  *RADIOLOGY REPORT*  Clinical Data: Follow up small bowel obstruction  ABDOMEN - 2 VIEW  Comparison: 07/08/2011  Findings: A nasogastric tube is in place.  The tip is in the expected location of the proximal duodenum.  The side port is well below the GE junction.  Again noted are multiple dilated loops of small bowel and air-fluid levels.  The appearance is stable to slightly improved in the interval.  No new findings identified.  There  is a right sided dynamic hip screw in place.  IMPRESSION:  1.  Stable to slight improvement in small bowel obstruction pattern.  Original Report Authenticated By: Angelita Ingles, M.D.   Dg Abd 2 Views  07/08/2011  *RADIOLOGY REPORT*  Clinical Data: Follow up SBO  ABDOMEN - 2 VIEW  Comparison: 07/07/2011  Findings: Mildly dilated loops of small bowel in the right mid abdomen, compatible with small bowel obstruction, grossly unchanged.  Small amount of residual contrast in the cecum.  Enteric tube looped in the stomach.  Degenerate changes of the visualized thoracolumbar spine.  ORIF of the proximal right femur.  IMPRESSION: Stable small bowel obstruction.  Original Report Authenticated By: Julian Hy, M.D.    Medications: Scheduled Meds:    . benzocaine   Mouth/Throat Once  . metoprolol  5 mg  Intravenous Q6H  . pantoprazole (PROTONIX) IV  40 mg Intravenous Q24H  . potassium chloride  10 mEq Intravenous Q1 Hr x 5   Continuous Infusions:    . dextrose 5 % and 0.9 % NaCl with KCl 20 mEq/L 100 mL/hr at 07/09/11 6122   PRN Meds:.hydrALAZINE, HYDROcodone-acetaminophen, LORazepam, menthol-cetylpyridinium, morphine, ondansetron (ZOFRAN) IV, ondansetron, phenol, promethazine Antibiotics: Anti-infectives    None       Assessment/Plan:  Principal Problem:  *SBO (small bowel obstruction) The patient was admitted and treated conservatively with bowel rest and NG decompression.  She was given anti-emetics for nausea.  Serial abdominal films have not shown resolution through 07/08/11.  Today's film was stable to possibly improved, and she appears to clinically be improving so surgery has not yet planned to do an exploratory lap. Active Problems:  HYPERTENSION Will d/c oral meds since she has an NG tube to suction.  Given tachycardia, we started her on routine metoprolol 07/08/11.  BP is now controlled.  Continue hydralazine PRN.  COPD (chronic obstructive pulmonary disease) Respiratory status stable.    Hypokalemia The patient's potassium was low 07/08/11, and normalized with replacement.    CKD (chronic kidney disease) stage 3, GFR 30-59 ml/min Renal function stable.  Creatinine 0.57 04/12/11.  ? Paroxysmal atrial fibrillation The patient has been monitored on tele.  She has been noted by the nursing staff to have periods of rapid atrial fibrillation.  An EKG on 07/07/11 showed sinus tachycardia with PVCs/ nonspecific ST abnormalities.   A repeat  EKG was done on 07/08/11, after an episode of what appeared to be atrial fibrillation and it showed PACs with aberrant conduction and PVCs or fusion complexes.  The patient was started on IV metoprolol Q 6 hours on 07/08/11.  Continue to monitor on tele.  No ASA/coumadin for now in case she needs surgery.     LOS: 6 days   Jacquelynn Cree,  MD Pager [phone]  07/09/2011, 10:48 AM

## 2011-07-09 NOTE — Progress Notes (Signed)
CARE MANAGEMENT NOTE 07/09/2011  Patient:  Cristina Parker, Cristina Parker   Account Number:  [id number]  Date Initiated:  07/04/2011  Documentation initiated by:  DAVIS,RHONDA  Subjective/Objective Assessment:   pt with confirmed sbo and pain     Action/Plan:   lives at home   Anticipated DC Date:  07/12/2011   Anticipated DC Plan:  HOME/SELF CARE  In-house referral  NA      DC Planning Services  NA      [facility] Choice  NA   Choice offered to / List presented to:  NA   DME arranged  NA      DME agency  NA     [place] arranged  NA      [place] agency  NA   Status of service:  In process, will continue to follow Medicare Important Message given?  NA - LOS <3 / Initial given by admissions (If response is "NO", the following Medicare IM given date fields will be blank) Date Medicare IM given:   Date Additional Medicare IM given:    Discharge Disposition:    Per UR Regulation:  Reviewed for med. necessity/level of care/duration of stay  Comments:  [id number]/Rhonda Davis,RN,BSN,CCM

## 2011-07-09 NOTE — Progress Notes (Signed)
Follow.  If she does not get over the hump in the next 24 hours,  May need exp lap.  She has made slow improvement over the weekend.  Repeat films. Very little out NGT.

## 2011-07-09 NOTE — Progress Notes (Signed)
Patient ID: NIURKA BENECKE, female   DOB: 1936/05/14, 76 y.o.   MRN: [id number]    Subjective: Pt feels ok.  Had 3 liquid BMs yesterday, but still not passing any flatus.    Objective: Vital signs in last 24 hours: Temp:  [97.9 F (36.6 C)-99 F (37.2 C)] 99 F (37.2 C) (01/21 0550) Pulse Rate:  [87-200] 90  (01/21 0550) Resp:  [18-20] 20  (01/21 0550) BP: (103-163)/(69-98) 103/69 mmHg (01/21 0550) SpO2:  [92 %-100 %] 97 % (01/21 0550) Weight:  [122 lb 5.7 oz (55.5 kg)] 122 lb 5.7 oz (55.5 kg) (01/21 0550) Last BM Date: 2011/07/18  Intake/Output from previous day: 07-17-2022 0701 - 01/21 0700 In: 2540 [I.V.:2500; NG/GT:30; IV Piggyback:10] Out: 1450 [Urine:1100; Emesis/NG output:350] Intake/Output this shift:    PE: Abd: soft, few BS, pain improved and now NT, still with some distention. Heart: regular Lungs: CTAB  Lab Results:   Basename 07/09/11 0454 18-Jul-2011 0500  WBC 10.6* 8.8  HGB 10.8* 10.5*  HCT 32.5* 31.4*  PLT 180 190   BMET  Basename 07/09/11 0454 07-18-2011 0500  NA 138 138  K 3.7 3.0*  CL 102 102  CO2 30 27  GLUCOSE 109* 121*  BUN 7 5*  CREATININE 0.63 0.54  CALCIUM 8.6 8.4   PT/INR No results found for this basename: LABPROT:2,INR:2 in the last 72 hours   Studies/Results: Dg Abd 2 Views  Jul 18, 2011  *RADIOLOGY REPORT*  Clinical Data: Follow up SBO  ABDOMEN - 2 VIEW  Comparison: 07/07/2011  Findings: Mildly dilated loops of small bowel in the right mid abdomen, compatible with small bowel obstruction, grossly unchanged.  Small amount of residual contrast in the cecum.  Enteric tube looped in the stomach.  Degenerate changes of the visualized thoracolumbar spine.  ORIF of the proximal right femur.  IMPRESSION: Stable small bowel obstruction.  Original Report Authenticated By: Julian Hy, M.D.   Dg Abd 2 Views  07/07/2011  *RADIOLOGY REPORT*  Clinical Data: Abdominal pain, follow-up small bowel obstruction  ABDOMEN - 2 VIEW  Comparison: 07/06/2011   Findings: Again seen are dilated loops of small bowel in the mid abdomen, compatible with small bowel obstruction.  Contrast has progressed into the cecum.  Enteric tube looped in the gastric cardia.  Heart is top normal in size. Postsurgical changes related to prior CABG.  Degenerative changes of the visualized thoracolumbar spine.  IMPRESSION: Findings compatible with small bowel obstruction.  Contrast has progressed into the cecum.  Original Report Authenticated By: Julian Hy, M.D.    Anti-infectives: Anti-infectives    None       Assessment/Plan  1. SBO Patient Active Problem List  Diagnoses  . Other and unspecified hyperlipidemia  . HYPERTENSION  . CORONARY HEART DISEASE  . VENTRICULAR ARRHYTHMIA  . PVD  . PNEUMONIA, ORGANISM UNSPECIFIED  . BRONCHITIS  . LUNG NODULE  . G E R D  . GASTRITIS  . OTHER OBSTRUCTION OF DUODENUM  . HIATAL HERNIA  . CROHN'S DISEASE  . OTHER ULCERATIVE COLITIS  . DIARRHEA  . CAROTID ENDARTERECTOMY, LEFT, HX OF  . SBO (small bowel obstruction)  . COPD (chronic obstructive pulmonary disease)  . Hypokalemia  . CKD (chronic kidney disease) stage 3, GFR 30-59 ml/min   Plan: 1. Will get abdominal films today.  Hopefully, since the patient had a few liquid BMs yesterday that indicates some oral contrast is moving through her colon.  If her x-rays still show minimal improvement, then we have to  consider possible surgical intervention in the near future. 2. Cont NGT for now.   LOS: 6 days    Tareva Leske E 07/09/2011

## 2011-07-09 NOTE — Plan of Care (Signed)
Problem: Phase III Progression Outcomes Goal: Pain controlled on oral analgesia Outcome: Not Progressing Pt cannot have po meds d/t NG tube

## 2011-07-10 ENCOUNTER — Inpatient Hospital Stay (HOSPITAL_COMMUNITY): Payer: Medicare Other

## 2011-07-10 LAB — BASIC METABOLIC PANEL
BUN: 4 mg/dL — ABNORMAL LOW (ref 6–23)
Calcium: 8.7 mg/dL (ref 8.4–10.5)
GFR calc non Af Amer: 88 mL/min — ABNORMAL LOW (ref >90)
Glucose, Bld: 102 mg/dL — ABNORMAL HIGH (ref 70–99)
Sodium: 138 mEq/L (ref 135–145)

## 2011-07-10 LAB — CBC
HCT: 30.9 % — ABNORMAL LOW (ref 36.0–46.0)
Hemoglobin: 10.2 g/dL — ABNORMAL LOW (ref 12.0–15.0)
MCH: 32.6 pg (ref 26.0–34.0)
MCHC: 33 g/dL (ref 30.0–36.0)

## 2011-07-10 MED ORDER — MENTHOL 3 MG MT LOZG
1.0000 | LOZENGE | OROMUCOSAL | Status: DC | PRN
  Filled 2011-07-10: qty 9

## 2011-07-10 MED ORDER — METOPROLOL TARTRATE 1 MG/ML IV SOLN
5.0000 mg | INTRAVENOUS | Status: DC
  Administered 2011-07-10 – 2011-07-13 (×17): 5 mg via INTRAVENOUS
  Filled 2011-07-10 (×21): qty 5

## 2011-07-10 NOTE — Progress Notes (Signed)
PATIENT DETAILS Name: EMIYA LOOMER Age: 76 y.o. Sex: female Date of Birth: April 12, 1936 Admit Date: 07/03/2011 ONG:[id number], MD, MD Emergency contact:   CONSULTS: 1.  Dr. Johnathan Hausen, Surgery  Interval History: Mrs. Schmeling is a 76 year old female who was admitted with a SBO on 07/03/11.  She has been followed by surgery.  She is being treated conservatively with NG decompression and bowel rest.  She has not significantly improved, so may ultimately require exploratory laparotomy.    ROS: Mrs. Ausley says she passed some flatus today.  No abdominal pain.  Nose/throat is sore from NG tube.  She is sitting up in the chair today.  Objective: Vital signs in last 24 hours: Temp:  [97.4 F (36.3 C)-98.6 F (37 C)] 98.1 F (36.7 C) 07/14/22 1405) Pulse Rate:  [76-104] 104  2022-07-14 1405) Resp:  [20] 20  14-Jul-2022 1405) BP: (123-135)/(76-85) 135/85 mmHg 07-14-22 1405) SpO2:  [94 %-99 %] 94 % July 14, 2022 1405) Weight:  [54.5 kg (120 lb 2.4 oz)] 54.5 kg (120 lb 2.4 oz) Jul 14, 2022 0602) Weight change: -1 kg (-2 lb 3.3 oz) Last BM Date: July 15, 2011  Intake/Output from previous day:  Intake/Output Summary (Last 24 hours) at July 15, 2011 1454 Last data filed at Jul 15, 2011 1400  Gross per 24 hour  Intake   3100 ml  Output   2201 ml  Net    899 ml     Physical Exam:  Gen:  NAD Cardiovascular:  HSIR Respiratory: Lungs CTAB Gastrointestinal: Soft, NT/ND with + BS Extremities: No C/E/C     Lab Results: Basic Metabolic Panel:  Lab 27/25/36 0422 07/09/11 0454 07/08/11 0500 07/07/11 0432 07/06/11 0420 07/05/11 0424  NA 138 138 138 134* 136 --  K 3.7 3.7 -- -- -- --  CL 104 102 102 101 104 --  CO2 _0 --  GLUCOSE 102* 109* 121* 115* 131* --  BUN 4* 7 5* 7 7 --  CREATININE 0.59 0.63 0.54 0.58 0.52 --  CALCIUM 8.7 8.6 8.4 8.5 8.7 --  MG -- 1.6 -- -- -- 1.9  PHOS -- -- -- -- -- --   GFR Estimated Creatinine Clearance: 44.3 ml/min (by C-G formula based on Cr of 0.59). Liver  Function Tests:  Lab 07/05/11 0424 07/03/11 1643  AST 16 22  ALT 10 16  ALKPHOS 112 173*  BILITOT 0.4 0.5  PROT 5.8* 7.7  ALBUMIN 3.1* 4.3    Lab 07/03/11 1643  LIPASE 49  AMYLASE --   Coagulation profile  Lab 07/03/11 1643  INR 0.91  PROTIME --    CBC:  Lab July 15, 2011 0422 07/09/11 0454 07/08/11 0500 07/07/11 0432 07/05/11 0424 07/03/11 1643  WBC 9.3 10.6* 8.8 8.4 10.5 --  NEUTROABS -- -- -- -- -- 12.0*  HGB 10.2* 10.8* 10.5* 11.4* 12.5 --  HCT 30.9* 32.5* 31.4* 33.5* 37.2 --  MCV 98.7 98.2 97.2 97.7 97.4 --  PLT 191 180 190 182 219 --   Microbiology No results found for this or any previous visit (from the past 240 hour(s)).  Studies/Results: Dg Abd 2 Views  15-Jul-2011  *RADIOLOGY REPORT*  Clinical Data: Small bowel obstruction.  ABDOMEN - 2 VIEW  Comparison: 07/09/2011 and CT chest 02/20/2011.  Findings: Nasogastric tube tip projects in the region of the pylorus.  There are dilated loops of small bowel in the central abdomen, with associated air fluid levels.  Gas is seen in nondistended colon.  Incidental imaging of the lower chest shows added  density in the right upper lung zone, corresponding to scarring on 02/20/2011. Mild bibasilar atelectasis, left greater than right, with tiny bilateral pleural effusions.  Scoliosis and spondylosis.  IMPRESSION:  1.  Continued improvement in small bowel obstruction, without resolution. 2.  Bibasilar atelectasis and tiny bilateral pleural effusions.  Original Report Authenticated By: Luretha Rued, M.D.   Dg Abd 2 Views  07/09/2011  *RADIOLOGY REPORT*  Clinical Data: Follow up small bowel obstruction  ABDOMEN - 2 VIEW  Comparison: 07/08/2011  Findings: A nasogastric tube is in place.  The tip is in the expected location of the proximal duodenum.  The side port is well below the GE junction.  Again noted are multiple dilated loops of small bowel and air-fluid levels.  The appearance is stable to slightly improved in the interval.  No  new findings identified.  There is a right sided dynamic hip screw in place.  IMPRESSION:  1.  Stable to slight improvement in small bowel obstruction pattern.  Original Report Authenticated By: Angelita Ingles, M.D.    Medications: Scheduled Meds:    . benzocaine   Mouth/Throat Once  . metoprolol  5 mg Intravenous Q6H  . pantoprazole (PROTONIX) IV  40 mg Intravenous Q24H   Continuous Infusions:    . dextrose 5 % and 0.9 % NaCl with KCl 20 mEq/L 100 mL/hr at 07/09/11 1840   PRN Meds:.hydrALAZINE, HYDROcodone-acetaminophen, LORazepam, menthol-cetylpyridinium, morphine, ondansetron (ZOFRAN) IV, ondansetron, phenol, promethazine, DISCONTD: menthol-cetylpyridinium Antibiotics: Anti-infectives    None       Assessment/Plan:  Principal Problem:  *SBO (small bowel obstruction) The patient was admitted and treated conservatively with bowel rest and NG decompression.  She was given anti-emetics for nausea.  Serial abdominal films have not shown resolution through 07/10/11, but films on 1/21 and 1/22 do show some improvement.  She currently has her NG tube clamped with the hopes that we can start her on CL and avoid surgery. Active Problems:  HYPERTENSION We d/c'd oral meds since she has an NG tube to suction.  Given tachycardia, we started her on routine metoprolol 07/08/11.  BP is now controlled.  Will increase metoprolol to Q 4 hour dosing given ongoing tachycardia.  Continue hydralazine PRN.  COPD (chronic obstructive pulmonary disease) Respiratory status stable.    Hypokalemia The patient's potassium was low 07/08/11, and normalized with replacement.    CKD (chronic kidney disease) stage 3, GFR 30-59 ml/min Renal function stable.  Creatinine 0.57 04/12/11.  ? Paroxysmal atrial fibrillation The patient has been monitored on tele.  She has been noted by the nursing staff to have periods of rapid atrial fibrillation.  An EKG on 07/07/11 showed sinus tachycardia with PVCs/ nonspecific ST  abnormalities.   A repeat  EKG was done on 07/08/11, after an episode of what appeared to be atrial fibrillation and it showed PACs with aberrant conduction and PVCs or fusion complexes.  The patient was started on IV metoprolol Q 6 hours on 07/08/11. No ASA/coumadin for now in case she needs surgery.  Increase metoprolol to Q 4 hour dosing given elevated HR.   Continue to monitor on tele.      LOS: 7 days   Jacquelynn Cree, MD Pager [phone]  07/10/2011, 2:54 PM

## 2011-07-10 NOTE — Progress Notes (Signed)
Patient ID: TALYNN LEBON, female   DOB: Apr 07, 1936, 76 y.o.   MRN: [id number]    Subjective: Pt feels ok.  Had some flatus yesterday.  Objective: Vital signs in last 24 hours: Temp:  [97.4 F (36.3 C)-98.6 F (37 C)] 97.4 F (36.3 C) 2022/07/28 0602) Pulse Rate:  [76-100] 76  Jul 28, 2022 0602) Resp:  [20] 20  July 28, 2022 0602) BP: (117-133)/(76-81) 123/81 mmHg 07/28/22 0602) SpO2:  [96 %-100 %] 99 % 07/28/2022 0602) Weight:  [120 lb 2.4 oz (54.5 kg)] 120 lb 2.4 oz (54.5 kg) 07/28/22 0602) Last BM Date: 29-Jul-2011  Intake/Output from previous day: 01/21 0701 - Jul 28, 2022 0700 In: 2356.7 [I.V.:2356.7] Out: 1800 [Urine:900; Emesis/NG output:300; Stool:600] Intake/Output this shift:    PE: Abd: soft, NT, ND, +BS  Lab Results:   [facility] 29-Jul-2011 0422 07/09/11 0454  WBC 9.3 10.6*  HGB 10.2* 10.8*  HCT 30.9* 32.5*  PLT 191 180   BMET  Basename Jul 29, 2011 0422 07/09/11 0454  NA 138 138  K 3.7 3.7  CL 104 102  CO2 28 30  GLUCOSE 102* 109*  BUN 4* 7  CREATININE 0.59 0.63  CALCIUM 8.7 8.6   PT/INR No results found for this basename: LABPROT:2,INR:2 in the last 72 hours   Studies/Results: Dg Abd 2 Views  July 29, 2011  *RADIOLOGY REPORT*  Clinical Data: Small bowel obstruction.  ABDOMEN - 2 VIEW  Comparison: 07/09/2011 and CT chest 02/20/2011.  Findings: Nasogastric tube tip projects in the region of the pylorus.  There are dilated loops of small bowel in the central abdomen, with associated air fluid levels.  Gas is seen in nondistended colon.  Incidental imaging of the lower chest shows added density in the right upper lung zone, corresponding to scarring on 02/20/2011. Mild bibasilar atelectasis, left greater than right, with tiny bilateral pleural effusions.  Scoliosis and spondylosis.  IMPRESSION:  1.  Continued improvement in small bowel obstruction, without resolution. 2.  Bibasilar atelectasis and tiny bilateral pleural effusions.  Original Report Authenticated By: Luretha Rued, M.D.   Dg Abd  2 Views  07/09/2011  *RADIOLOGY REPORT*  Clinical Data: Follow up small bowel obstruction  ABDOMEN - 2 VIEW  Comparison: 07/08/2011  Findings: A nasogastric tube is in place.  The tip is in the expected location of the proximal duodenum.  The side port is well below the GE junction.  Again noted are multiple dilated loops of small bowel and air-fluid levels.  The appearance is stable to slightly improved in the interval.  No new findings identified.  There is a right sided dynamic hip screw in place.  IMPRESSION:  1.  Stable to slight improvement in small bowel obstruction pattern.  Original Report Authenticated By: Angelita Ingles, M.D.   Dg Abd 2 Views  07/08/2011  *RADIOLOGY REPORT*  Clinical Data: Follow up SBO  ABDOMEN - 2 VIEW  Comparison: 07/07/2011  Findings: Mildly dilated loops of small bowel in the right mid abdomen, compatible with small bowel obstruction, grossly unchanged.  Small amount of residual contrast in the cecum.  Enteric tube looped in the stomach.  Degenerate changes of the visualized thoracolumbar spine.  ORIF of the proximal right femur.  IMPRESSION: Stable small bowel obstruction.  Original Report Authenticated By: Julian Hy, M.D.    Anti-infectives: Anti-infectives    None       Assessment/Plan  1. PSBO, possibly high-grade  Plan: 1. Will review x-rays with Dr. Brantley Stage.  If she is failing to progress then we may proceed  with OR today or tomorrow.  If he feels we are making some progress, then we may look at d/c her NG tube and give her a diet trial and see how she does.   2. Will continue to follow closely.   LOS: 7 days    Lavena Loretto E 07/10/2011

## 2011-07-10 NOTE — Progress Notes (Signed)
Pt continues with slow progress but improving.  Try to clamp NGT and monitor.

## 2011-07-10 NOTE — Progress Notes (Signed)
Pt had SVT (HR-180s) while getting up to use BSC. HR stabilized when pt went back to bed. MD notified, orders received to monitor patient and observe for any anxiety or external factors causing increase in HR.

## 2011-07-11 LAB — CBC
MCH: 31.8 pg (ref 26.0–34.0)
MCHC: 32.6 g/dL (ref 30.0–36.0)
Platelets: 226 10*3/uL (ref 150–400)
RDW: 12.1 % (ref 11.5–15.5)

## 2011-07-11 LAB — BASIC METABOLIC PANEL
Calcium: 9 mg/dL (ref 8.4–10.5)
GFR calc non Af Amer: 86 mL/min — ABNORMAL LOW (ref >90)
Sodium: 136 mEq/L (ref 135–145)

## 2011-07-11 MED ORDER — BOOST / RESOURCE BREEZE PO LIQD
1.0000 | Freq: Two times a day (BID) | ORAL | Status: DC
  Administered 2011-07-11 – 2011-07-12 (×3): 1 via ORAL

## 2011-07-11 NOTE — Progress Notes (Signed)
Patient ID: Cristina Parker, female   DOB: 1936/01/22, 76 y.o.   MRN: [id number]    Subjective: Pt feels ok this morning.  Glad to have NG out.  + flatus, no nausea  Objective: Vital signs in last 24 hours: Temp:  [97.3 F (36.3 C)-98.5 F (36.9 C)] 97.3 F (36.3 C) (01/23 0508) Pulse Rate:  [93-104] 95  (01/23 0508) Resp:  [20] 20  (01/23 0508) BP: (129-136)/(77-87) 136/87 mmHg (01/23 0508) SpO2:  [94 %-96 %] 96 % (01/23 0508) Weight:  [116 lb 6.4 oz (52.799 kg)] 116 lb 6.4 oz (52.799 kg) (01/23 0508) Last BM Date: 07/11/11  Intake/Output from previous day: July 28, 2022 0701 - 01/23 0700 In: 2370 [I.V.:2370] Out: 401 [Urine:201; Stool:200] Intake/Output this shift:    PE: Abd: sot, NT, ND, few BS Ht: regular Lungs: CTAB  Lab Results:   [facility] 07/11/11 0520 2011/07/29 0422  WBC 9.4 9.3  HGB 10.7* 10.2*  HCT 32.8* 30.9*  PLT 226 191   BMET  Basename 07/11/11 0520 07/29/2011 0422  NA 136 138  K 3.7 3.7  CL 102 104  CO2 28 28  GLUCOSE 109* 102*  BUN 4* 4*  CREATININE 0.64 0.59  CALCIUM 9.0 8.7   PT/INR No results found for this basename: LABPROT:2,INR:2 in the last 72 hours   Studies/Results: Dg Abd 2 Views  07-29-11  *RADIOLOGY REPORT*  Clinical Data: Small bowel obstruction.  ABDOMEN - 2 VIEW  Comparison: 07/09/2011 and CT chest 02/20/2011.  Findings: Nasogastric tube tip projects in the region of the pylorus.  There are dilated loops of small bowel in the central abdomen, with associated air fluid levels.  Gas is seen in nondistended colon.  Incidental imaging of the lower chest shows added density in the right upper lung zone, corresponding to scarring on 02/20/2011. Mild bibasilar atelectasis, left greater than right, with tiny bilateral pleural effusions.  Scoliosis and spondylosis.  IMPRESSION:  1.  Continued improvement in small bowel obstruction, without resolution. 2.  Bibasilar atelectasis and tiny bilateral pleural effusions.  Original Report Authenticated By:  Luretha Rued, M.D.   Dg Abd 2 Views  07/09/2011  *RADIOLOGY REPORT*  Clinical Data: Follow up small bowel obstruction  ABDOMEN - 2 VIEW  Comparison: 07/08/2011  Findings: A nasogastric tube is in place.  The tip is in the expected location of the proximal duodenum.  The side port is well below the GE junction.  Again noted are multiple dilated loops of small bowel and air-fluid levels.  The appearance is stable to slightly improved in the interval.  No new findings identified.  There is a right sided dynamic hip screw in place.  IMPRESSION:  1.  Stable to slight improvement in small bowel obstruction pattern.  Original Report Authenticated By: Angelita Ingles, M.D.    Anti-infectives: Anti-infectives    None       Assessment/Plan  1. PSBO  Plan: 1. Will allow patient to try clear liquids today.  If she fails advancement, will need OR.  Hopefully, she will be able to advance with no further issues.   LOS: 8 days    Cleopatra Sardo E 07/11/2011

## 2011-07-11 NOTE — Progress Notes (Signed)
Subjective: Continues to feel better.  No specific complaints.  Denies any abdominal pain.  Objective: Vital signs in last 24 hours: Filed Vitals:   07-28-11 1405 2011/07/28 2151 07/11/11 0508 07/11/11 1415  BP: 135/85 129/77 136/87 106/65  Pulse: 104 93 95 88  Temp: 98.1 F (36.7 C) 98.5 F (36.9 C) 97.3 F (36.3 C) 98.4 F (36.9 C)  TempSrc: Oral Oral Oral Oral  Resp: _0 Height:      Weight:   52.799 kg (116 lb 6.4 oz)   SpO2: 94% 96% 96% 96%   Weight change: -1.701 kg (-3 lb 12 oz)  Intake/Output Summary (Last 24 hours) at 07/11/11 1537 Last data filed at 07/11/11 1300  Gross per 24 hour  Intake 1866.67 ml  Output      0 ml  Net 1866.67 ml    Physical Exam: General: Awake, Oriented, No acute distress. HEENT: EOMI. Neck: Supple CV: S1 and S2 Lungs: Clear to ascultation bilaterally Abdomen: Soft, Nontender, Nondistended, +bowel sounds. Ext: Good pulses. Trace edema.  Lab Results:  Basename 07/11/11 0520 July 28, 2011 0422 07/09/11 0454  NA 136 138 --  K 3.7 3.7 --  CL 102 104 --  CO2 28 28 --  GLUCOSE 109* 102* --  BUN 4* 4* --  CREATININE 0.64 0.59 --  CALCIUM 9.0 8.7 --  MG -- -- 1.6  PHOS -- -- --   No results found for this basename: AST:2,ALT:2,ALKPHOS:2,BILITOT:2,PROT:2,ALBUMIN:2 in the last 72 hours No results found for this basename: LIPASE:2,AMYLASE:2 in the last 72 hours  Basename 07/11/11 0520 28-Jul-2011 0422  WBC 9.4 9.3  NEUTROABS -- --  HGB 10.7* 10.2*  HCT 32.8* 30.9*  MCV 97.6 98.7  PLT 226 191   No results found for this basename: CKTOTAL:3,CKMB:3,CKMBINDEX:3,TROPONINI:3 in the last 72 hours No components found with this basename: POCBNP:3 No results found for this basename: DDIMER:2 in the last 72 hours No results found for this basename: HGBA1C:2 in the last 72 hours No results found for this basename: CHOL:2,HDL:2,LDLCALC:2,TRIG:2,CHOLHDL:2,LDLDIRECT:2 in the last 72 hours No results found for this basename:  TSH,T4TOTAL,FREET3,T3FREE,THYROIDAB in the last 72 hours No results found for this basename: VITAMINB12:2,FOLATE:2,FERRITIN:2,TIBC:2,IRON:2,RETICCTPCT:2 in the last 72 hours  Micro Results: No results found for this or any previous visit (from the past 240 hour(s)).  Studies/Results: Dg Abd 2 Views  07-28-2011  *RADIOLOGY REPORT*  Clinical Data: Small bowel obstruction.  ABDOMEN - 2 VIEW  Comparison: 07/09/2011 and CT chest 02/20/2011.  Findings: Nasogastric tube tip projects in the region of the pylorus.  There are dilated loops of small bowel in the central abdomen, with associated air fluid levels.  Gas is seen in nondistended colon.  Incidental imaging of the lower chest shows added density in the right upper lung zone, corresponding to scarring on 02/20/2011. Mild bibasilar atelectasis, left greater than right, with tiny bilateral pleural effusions.  Scoliosis and spondylosis.  IMPRESSION:  1.  Continued improvement in small bowel obstruction, without resolution. 2.  Bibasilar atelectasis and tiny bilateral pleural effusions.  Original Report Authenticated By: Luretha Rued, M.D.    Medications: I have reviewed the patient's current medications. Scheduled Meds:   . benzocaine   Mouth/Throat Once  . feeding supplement  1 Container Oral BID BM  . metoprolol  5 mg Intravenous Q4H  . pantoprazole (PROTONIX) IV  40 mg Intravenous Q24H   Continuous Infusions:   . dextrose 5 % and 0.9 % NaCl with KCl 20 mEq/L 100 mL/hr at  07/11/11 1514   PRN Meds:.hydrALAZINE, HYDROcodone-acetaminophen, LORazepam, menthol-cetylpyridinium, morphine, ondansetron (ZOFRAN) IV, ondansetron, phenol, promethazine  Assessment/Plan: SBO (small bowel obstruction)  Admitted and treated conservatively with bowel rest and NG decompression. She was given anti-emetics for nausea. Serial abdominal films have not shown resolution through 07/10/11, but films on 1/21 and 1/22 do show some improvement.  NG tube was  discontinued on January 22 of 2013.  Currently on a clear liquid diet.  Appreciate surgery's input.  Will request PT/OT for mobilization.  HYPERTENSION  DCed oral meds since she has an NG tube to suction. Given tachycardia, started her on routine metoprolol 07/08/11. BP is now controlled. Will increase metoprolol to Q 4 hour dosing given ongoing tachycardia. Continue hydralazine PRN.   COPD (chronic obstructive pulmonary disease)  Respiratory status stable.   Hypokalemia  The patient's potassium was low 07/08/11, and normalized with replacement.   ? Paroxysmal atrial fibrillation  She has been noted by the nursing staff to have periods of rapid atrial fibrillation. An EKG on 07/07/11 showed sinus tachycardia with PVCs/ nonspecific ST abnormalities. A repeat EKG was done on 07/08/11, after an episode of what appeared to be atrial fibrillation and it showed PACs with aberrant conduction and PVCs or fusion complexes. No ASA/coumadin for now in case she needs surgery. On metoprolol to Q 4 hour dosing given elevated HR.   Disposition Pending.   LOS: 8 days  Yaseen Gilberg A, MD 07/11/2011, 3:37 PM

## 2011-07-11 NOTE — Progress Notes (Signed)
INITIAL ADULT NUTRITION ASSESSMENT Date: 07/11/2011   Time: 12:23 PM Reason for Assessment: consult  ASSESSMENT: Female 76 y.o.  Dx: SBO (small bowel obstruction)  Hx:  Past Medical History  Diagnosis Date  . Heart disease   . Crohn's disease   . Duodenal stricture   . Esophageal reflux   . Hypertension   . Hyperlipemia   . Hiatal hernia   . Pneumonia   . Anemia   . Depression   . Collagenous colitis   . Anxiety   . Paroxysmal a-fib 07/09/2011   Past Surgical History  Procedure Date  . Coronary artery bypass graft   . Hip surgery     right  . Abdominal hysterectomy   . Cervical disc surgery   . Bunionectomy 1977    both feet   . Fracture surgery 1998    ORIF  . Eye surgery 10/2010    left eye cataract  . Carotid endarterectomy 05-1998    Left CEA  . Carotid endarterectomy 04/11/11    Right CEA  . 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 benzocaine   Mouth/Throat Once  . metoprolol  5 mg Intravenous Q4H  . pantoprazole (PROTONIX) IV  40 mg Intravenous Q24H  . DISCONTD: metoprolol  5 mg Intravenous Q6H   Continuous Infusions:   . dextrose 5 % and 0.9 % NaCl with KCl 20 mEq/L 100 mL/hr at 07/11/11 0442   PRN Meds:.hydrALAZINE, HYDROcodone-acetaminophen, LORazepam, menthol-cetylpyridinium, morphine, ondansetron (ZOFRAN) IV, ondansetron, phenol, promethazine   Ht: 5' (152.4 cm)  Wt: 116 lb 6.4 oz (52.799 kg)  Ideal Wt: 55.7kg % Ideal Wt: 94%  Usual Wt: same % Usual Wt:   Body mass index is 22.73 kg/(m^2).  Food/Nutrition Related Hx:  Pt admitted with abdominal pain and vomiting found with SBO.  Pt's obstruction has been persistent, treated conservatively and with bowel rest.  NGT remained until yesterday.  Pt advanced to clear liquids this am. She consumed 100% of breakfast and lunch today. Pt denies nausea, abdominal pain.  Labs:  CMP     Component Value Date/Time   NA 136  07/11/2011 0520   K 3.7 07/11/2011 0520   CL 102 07/11/2011 0520   CO2 28 07/11/2011 0520   GLUCOSE 109* 07/11/2011 0520   BUN 4* 07/11/2011 0520   CREATININE 0.64 07/11/2011 0520   CREATININE 0.75 02/13/2011 1657   CALCIUM 9.0 07/11/2011 0520   PROT 5.8* 07/05/2011 0424   ALBUMIN 3.1* 07/05/2011 0424   AST 16 07/05/2011 0424   ALT 10 07/05/2011 0424   ALKPHOS 112 07/05/2011 0424   BILITOT 0.4 07/05/2011 0424   GFRNONAA 86* 07/11/2011 0520   GFRAA >90 07/11/2011 0520    Intake: 100% x2 Output:   Intake/Output Summary (Last 24 hours) at 07/11/11 1233 Last data filed at 07/11/11 0900  Gross per 24 hour  Intake   2490 ml  Output    200 ml  Net   2290 ml   1 BM today, averaging 1-2 BMs daily.  Diet Order: Clear Liquid  Supplements/Tube Feeding: none at this time  IVF:    dextrose 5 % and 0.9 % NaCl with KCl 20 mEq/L Last Rate: 100 mL/hr at 07/11/11 0442    Estimated Nutritional Needs:   Kcal: 1450-1560 kcal Protein: 62-73g Fluid: >1.6 L/day  NUTRITION DIAGNOSIS: -Inadequate oral intake (NI-2.1).  Status: Ongoing  RELATED TO: omission of energy  dense foods  AS EVIDENCE BY: pt on clear liquids, diet slow to advance due to persistent SBO  MONITORING/EVALUATION(Goals): 1.  Food/Beverage; diet advancement with tolerance.  Pt to consume >50% of meals.  EDUCATION NEEDS: -Education needs addressed  INTERVENTION: 1.  Supplements; will begin supplementation process with pt to encourage small frequent meals and increased protein intake.  Resource Breeze BID between meals.  Dietitian [phone]  DOCUMENTATION CODES Per approved criteria  -Not Applicable    Brynda Greathouse [facility] 07/11/2011, 12:23 PM

## 2011-07-11 NOTE — Progress Notes (Signed)
Slow improvement adv diet.

## 2011-07-12 LAB — MAGNESIUM: Magnesium: 1.4 mg/dL — ABNORMAL LOW (ref 1.5–2.5)

## 2011-07-12 LAB — BASIC METABOLIC PANEL
GFR calc Af Amer: >90 mL/min (ref >90)
GFR calc non Af Amer: 89 mL/min — ABNORMAL LOW (ref >90)
Potassium: 3.4 mEq/L — ABNORMAL LOW (ref 3.5–5.1)
Sodium: 141 mEq/L (ref 135–145)

## 2011-07-12 MED ORDER — MAGNESIUM SULFATE 40 MG/ML IJ SOLN
2.0000 g | Freq: Once | INTRAMUSCULAR | Status: AC
  Administered 2011-07-12: 2 g via INTRAVENOUS
  Filled 2011-07-12: qty 50

## 2011-07-12 MED ORDER — POTASSIUM CHLORIDE CRYS ER 20 MEQ PO TBCR
20.0000 meq | EXTENDED_RELEASE_TABLET | Freq: Once | ORAL | Status: DC
  Filled 2011-07-12: qty 1

## 2011-07-12 MED ORDER — ENOXAPARIN SODIUM 40 MG/0.4ML ~~LOC~~ SOLN
40.0000 mg | SUBCUTANEOUS | Status: DC
  Administered 2011-07-12: 40 mg via SUBCUTANEOUS
  Filled 2011-07-12 (×2): qty 0.4

## 2011-07-12 NOTE — Progress Notes (Signed)
Spoke to pt at length about OT services.  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  She has been bathing self, transferring to [facility] and dressing w/o assist here in hospital per her report and was I PTA.  Will sign off per pt request. From the little I saw her do in her room, this seems reasonable.  Jinger Neighbors, [place] [phone]

## 2011-07-12 NOTE — Progress Notes (Signed)
Subjective: Diet advanced to full liquid diet so far tolerating it.  Denies any abdominal pain.  Objective: Vital signs in last 24 hours: Filed Vitals:   07/12/11 0433 07/12/11 0555 07/12/11 1035 07/12/11 1434  BP: 131/88   137/91  Pulse: 98  119 93  Temp: 97.5 F (36.4 C)   97.7 F (36.5 C)  TempSrc: Oral   Oral  Resp: 18   18  Height:      Weight:  53.797 kg (118 lb 9.6 oz)    SpO2: 95%  96% 99%   Weight change: 0.998 kg (2 lb 3.2 oz)  Intake/Output Summary (Last 24 hours) at 07/12/11 1753 Last data filed at 07/12/11 1434  Gross per 24 hour  Intake 3203.33 ml  Output    502 ml  Net 2701.33 ml    Physical Exam: General: Awake, Oriented, No acute distress. HEENT: EOMI. Neck: Supple CV: S1 and S2 Lungs: Clear to ascultation bilaterally Abdomen: Soft, Nontender, Nondistended, +bowel sounds. Ext: Good pulses. Trace edema.  Lab Results:  Basename 07/12/11 0452 07/11/11 0520  NA 141 136  K 3.4* 3.7  CL 108 102  CO2 26 28  GLUCOSE 104* 109*  BUN 3* 4*  CREATININE 0.58 0.64  CALCIUM 8.4 9.0  MG 1.4* --  PHOS -- --   No results found for this basename: AST:2,ALT:2,ALKPHOS:2,BILITOT:2,PROT:2,ALBUMIN:2 in the last 72 hours No results found for this basename: LIPASE:2,AMYLASE:2 in the last 72 hours  Basename 07/11/11 0520 07/10/11 0422  WBC 9.4 9.3  NEUTROABS -- --  HGB 10.7* 10.2*  HCT 32.8* 30.9*  MCV 97.6 98.7  PLT 226 191   No results found for this basename: CKTOTAL:3,CKMB:3,CKMBINDEX:3,TROPONINI:3 in the last 72 hours No components found with this basename: POCBNP:3 No results found for this basename: DDIMER:2 in the last 72 hours No results found for this basename: HGBA1C:2 in the last 72 hours No results found for this basename: CHOL:2,HDL:2,LDLCALC:2,TRIG:2,CHOLHDL:2,LDLDIRECT:2 in the last 72 hours No results found for this basename: TSH,T4TOTAL,FREET3,T3FREE,THYROIDAB in the last 72 hours No results found for this basename:  VITAMINB12:2,FOLATE:2,FERRITIN:2,TIBC:2,IRON:2,RETICCTPCT:2 in the last 72 hours  Micro Results: No results found for this or any previous visit (from the past 240 hour(s)).  Studies/Results: No results found.  Medications: I have reviewed the patient's current medications. Scheduled Meds:    . benzocaine   Mouth/Throat Once  . enoxaparin (LOVENOX) injection  40 mg Subcutaneous Q24H  . feeding supplement  1 Container Oral BID BM  . magnesium sulfate 1 - 4 g bolus IVPB  2 g Intravenous Once  . metoprolol  5 mg Intravenous Q4H  . pantoprazole (PROTONIX) IV  40 mg Intravenous Q24H   Continuous Infusions:    . dextrose 5 % and 0.9 % NaCl with KCl 20 mEq/L 100 mL/hr at 07/12/11 1226   PRN Meds:.hydrALAZINE, HYDROcodone-acetaminophen, LORazepam, menthol-cetylpyridinium, morphine, ondansetron (ZOFRAN) IV, ondansetron, phenol, promethazine  Assessment/Plan: SBO (small bowel obstruction)  Admitted and treated conservatively with bowel rest and NG decompression. She was given anti-emetics for nausea. Serial abdominal films have not shown resolution through 07/10/11, but films on 1/21 and 1/22 do show some improvement.  NG tube was discontinued on January 22 of 2013.  Was started on clear liquid diet and diet advanced to currently full liquid diet.  Appreciate surgery's input.   HYPERTENSION  DCed oral meds since she has an NG tube to suction. Given tachycardia, started her on routine metoprolol 07/08/11. BP is now controlled. Will increase metoprolol to Q 4 hour  dosing given ongoing tachycardia. Continue hydralazine PRN.  If continued to do well transition back to oral regimen.  COPD (chronic obstructive pulmonary disease)  Respiratory status stable.   Hypokalemia/Hypomagnesemia Replace when necessary.  ? Paroxysmal atrial fibrillation  She has been noted by the nursing staff to have periods of rapid atrial fibrillation. An EKG on 07/07/11 showed sinus tachycardia with PVCs/ nonspecific ST  abnormalities. A repeat EKG was done on 07/08/11, after an episode of what appeared to be atrial fibrillation and it showed PACs with aberrant conduction and PVCs or fusion complexes. No ASA/coumadin for now in case she needs surgery. On metoprolol to Q 4 hour dosing given elevated HR.   Disposition Pending.   LOS: 9 days  Jessica Checketts A, MD 07/12/2011, 5:53 PM

## 2011-07-12 NOTE — Evaluation (Signed)
Physical Therapy Evaluation Patient Details Name: Cristina Parker MRN: [id number] DOB: 08-29-1935 Today's Date: 07/12/2011 Time: 0938-1829 Charge: EVII  Problem List:  Patient Active Problem List  Diagnoses  . Other and unspecified hyperlipidemia  . HYPERTENSION  . CORONARY HEART DISEASE  . VENTRICULAR ARRHYTHMIA  . PVD  . PNEUMONIA, ORGANISM UNSPECIFIED  . BRONCHITIS  . LUNG NODULE  . G E R D  . GASTRITIS  . OTHER OBSTRUCTION OF DUODENUM  . HIATAL HERNIA  . CROHN'S DISEASE  . OTHER ULCERATIVE COLITIS  . DIARRHEA  . CAROTID ENDARTERECTOMY, LEFT, HX OF  . SBO (small bowel obstruction)  . COPD (chronic obstructive pulmonary disease)  . Hypokalemia  . Paroxysmal a-fib    Past Medical History:  Past Medical History  Diagnosis Date  . Heart disease   . Crohn's disease   . Duodenal stricture   . Esophageal reflux   . Hypertension   . Hyperlipemia   . Hiatal hernia   . Pneumonia   . Anemia   . Depression   . Collagenous colitis   . Anxiety   . Paroxysmal a-fib 07/09/2011   Past Surgical History:  Past Surgical History  Procedure Date  . Coronary artery bypass graft   . Hip surgery     right  . Abdominal hysterectomy   . Cervical disc surgery   . Bunionectomy 1977    both feet   . Fracture surgery 1998    ORIF  . Eye surgery 10/2010    left eye cataract  . Carotid endarterectomy 05-1998    Left CEA  . Carotid endarterectomy 04/11/11    Right CEA  . 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  Pt reports feeling weak and having poor endurance.  Pt educated to use RW or SPC upon return home 2* to unsteadiness with gait today and pt agreeable. PT Recommendation/Assessment: Patient will need  skilled PT in the acute care venue PT Problem List: Decreased activity tolerance;Decreased knowledge of use of DME;Decreased mobility PT Therapy Diagnosis : Difficulty walking PT Plan PT Frequency: Min 3X/week PT Treatment/Interventions: DME instruction;Gait training;Stair training;Functional mobility training;Patient/family education PT Recommendation Follow Up Recommendations: No PT follow up Equipment Recommended: None recommended by PT PT Goals  Acute Rehab PT Goals PT Goal Formulation: With patient Time For Goal Achievement: 7 days Pt will Ambulate: >150 feet;with modified independence;with least restrictive assistive device PT Goal: Ambulate - Progress: Goal set today Pt will Go Up / Down Stairs: 3-5 stairs;with rail(s);with modified independence PT Goal: Up/Down Stairs - Progress: Goal set today  PT Evaluation Precautions/Restrictions  Precautions Precautions: Fall Prior Functioning  Home Living Lives With: Son Type of Home: House Home Layout: Able to live on main level with bedroom/bathroom;Two level Home Access: Stairs to enter Entrance Stairs-Rails: Right Entrance Stairs-Number of Steps: 3-4 Home Adaptive Equipment: Straight cane;Quad cane;Wheelchair - manual;Walker - rolling Prior Function Level of Independence: Independent with gait;Independent with basic ADLs Driving: Yes Cognition Cognition Arousal/Alertness: Awake/alert Overall Cognitive Status: Appears within functional limits for tasks assessed Sensation/Coordination Sensation Light Touch: Appears Intact Extremity Assessment RLE Assessment RLE Assessment: Within Functional Limits LLE Assessment LLE Assessment: Within Functional Limits Mobility (including Balance) Bed Mobility Bed Mobility: Yes Supine to Sit: 5: Supervision Supine to Sit  Details (indicate cue type and reason): increased time Transfers Transfers: Yes Sit to Stand: 5: Supervision;From bed Stand to Sit: 5: Supervision;To  chair/3-in-1 Ambulation/Gait Ambulation/Gait: Yes Ambulation/Gait Assistance: 4: Min assist Ambulation/Gait Assistance Details (indicate cue type and reason): min/guard for safety, pt with occasional unsteady gait but no LOB, pt pushed IV pole and occasional reach for rail but cued to try not to use rail today Ambulation Distance (Feet): 200 Feet Assistive device:  (IV pole) Gait Pattern: Decreased stride length (wide BOS)    Cardiopulm: Pre-gait: HR 99, SaO2 98% Post-gait: HR 119, SaO2 96%  Exercise    End of Session PT - End of Session Activity Tolerance: Patient tolerated treatment well Patient left: in chair;with call bell in reach General Behavior During Session: [facility] for tasks performed Cognition: [facility] for tasks performed  Darika Ildefonso,KATHrine E 07/12/2011, 1:06 PM Pager: [phone]

## 2011-07-12 NOTE — Progress Notes (Signed)
Patient ID: Cristina Parker, female   DOB: 1935/07/31, 76 y.o.   MRN: [id number]    Subjective: Pt feels well.  C/o loose BMs with limited warning.  Tolerating clear liquids without complaints.  Objective: Vital signs in last 24 hours: Temp:  [97.5 F (36.4 C)-98.4 F (36.9 C)] 97.5 F (36.4 C) (01/24 0433) Pulse Rate:  [88-102] 98  (01/24 0433) Resp:  [18-20] 18  (01/24 0433) BP: (106-131)/(65-88) 131/88 mmHg (01/24 0433) SpO2:  [95 %-97 %] 95 % (01/24 0433) Weight:  [118 lb 9.6 oz (53.797 kg)] 118 lb 9.6 oz (53.797 kg) (01/24 0555) Last BM Date: 07/11/11  Intake/Output from previous day: 01/23 0701 - 01/24 0700 In: 3083.3 [P.O.:600; I.V.:2473.3; IV Piggyback:10] Out: -  Intake/Output this shift:    PE: Abd: soft, NT, ND, +BS  Lab Results:   [facility] 07/11/11 0520 07-26-11 0422  WBC 9.4 9.3  HGB 10.7* 10.2*  HCT 32.8* 30.9*  PLT 226 191   BMET  Basename 07/12/11 0452 07/11/11 0520  NA 141 136  K 3.4* 3.7  CL 108 102  CO2 26 28  GLUCOSE 104* 109*  BUN 3* 4*  CREATININE 0.58 0.64  CALCIUM 8.4 9.0   PT/INR No results found for this basename: LABPROT:2,INR:2 in the last 72 hours   Studies/Results: Dg Abd 2 Views  July 26, 2011  *RADIOLOGY REPORT*  Clinical Data: Small bowel obstruction.  ABDOMEN - 2 VIEW  Comparison: 07/09/2011 and CT chest 02/20/2011.  Findings: Nasogastric tube tip projects in the region of the pylorus.  There are dilated loops of small bowel in the central abdomen, with associated air fluid levels.  Gas is seen in nondistended colon.  Incidental imaging of the lower chest shows added density in the right upper lung zone, corresponding to scarring on 02/20/2011. Mild bibasilar atelectasis, left greater than right, with tiny bilateral pleural effusions.  Scoliosis and spondylosis.  IMPRESSION:  1.  Continued improvement in small bowel obstruction, without resolution. 2.  Bibasilar atelectasis and tiny bilateral pleural effusions.  Original Report  Authenticated By: Luretha Rued, M.D.    Anti-infectives: Anti-infectives    None       Assessment/Plan  1. SBO, improving 2. Loose stools, suspect secondary to no solid food for over a week, will follow  Plan: 1. Will advance to full liquids and see how she does.   LOS: 9 days    Jahne Krukowski E 07/12/2011

## 2011-07-13 LAB — BASIC METABOLIC PANEL
BUN: <3 mg/dL — ABNORMAL LOW (ref 6–23)
CO2: 25 mEq/L (ref 19–32)
Chloride: 111 mEq/L (ref 96–112)
GFR calc Af Amer: >90 mL/min (ref >90)
Potassium: 3.6 mEq/L (ref 3.5–5.1)

## 2011-07-13 LAB — MAGNESIUM: Magnesium: 1.7 mg/dL (ref 1.5–2.5)

## 2011-07-13 MED ORDER — BOOST / RESOURCE BREEZE PO LIQD: 1.0000 | Freq: Two times a day (BID) | ORAL | Status: DC

## 2011-07-13 MED ORDER — METOPROLOL SUCCINATE ER 50 MG PO TB24
50.0000 mg | ORAL_TABLET | Freq: Every day | ORAL | Status: DC
  Filled 2011-07-13: qty 1

## 2011-07-13 NOTE — Progress Notes (Signed)
Better hopefully home once on diet

## 2011-07-13 NOTE — Progress Notes (Signed)
Looks great.  Advance diet.  No surgery necessary.

## 2011-07-13 NOTE — Progress Notes (Addendum)
Subjective: Diet advanced to heart healthy diet.  Tolerating it so far.  Denies any abdominal pain.  Wants to know when she can home.  Objective: Vital signs in last 24 hours: Filed Vitals:   07/12/11 1434 07/12/11 2057 07/13/11 0512 07/13/11 1100  BP: 137/91 111/72 108/76 119/88  Pulse: 93 99 95 96  Temp: 97.7 F (36.5 C) 97.7 F (36.5 C) 97.5 F (36.4 C)   TempSrc: Oral Oral Oral   Resp: _0 Height:      Weight:   53.5 kg (117 lb 15.1 oz)   SpO2: 99% 96% 97%    Weight change: -0.297 kg (-10.5 oz)  Intake/Output Summary (Last 24 hours) at 07/13/11 1300 Last data filed at 07/13/11 1015  Gross per 24 hour  Intake   2160 ml  Output   1302 ml  Net    858 ml    Physical Exam: General: Awake, Oriented, No acute distress. HEENT: EOMI. Neck: Supple CV: S1 and S2 Lungs: Clear to ascultation bilaterally Abdomen: Soft, Nontender, Nondistended, +bowel sounds. Ext: Good pulses. Trace edema.  Lab Results:  Basename 07/13/11 0629 07/12/11 0452  NA 142 141  K 3.6 3.4*  CL 111 108  CO2 25 26  GLUCOSE 98 104*  BUN <3* 3*  CREATININE 0.52 0.58  CALCIUM 8.3* 8.4  MG 1.7 1.4*  PHOS -- --   No results found for this basename: AST:2,ALT:2,ALKPHOS:2,BILITOT:2,PROT:2,ALBUMIN:2 in the last 72 hours No results found for this basename: LIPASE:2,AMYLASE:2 in the last 72 hours  Basename 07/11/11 0520  WBC 9.4  NEUTROABS --  HGB 10.7*  HCT 32.8*  MCV 97.6  PLT 226   No results found for this basename: CKTOTAL:3,CKMB:3,CKMBINDEX:3,TROPONINI:3 in the last 72 hours No components found with this basename: POCBNP:3 No results found for this basename: DDIMER:2 in the last 72 hours No results found for this basename: HGBA1C:2 in the last 72 hours No results found for this basename: CHOL:2,HDL:2,LDLCALC:2,TRIG:2,CHOLHDL:2,LDLDIRECT:2 in the last 72 hours No results found for this basename: TSH,T4TOTAL,FREET3,T3FREE,THYROIDAB in the last 72 hours No results found for this  basename: VITAMINB12:2,FOLATE:2,FERRITIN:2,TIBC:2,IRON:2,RETICCTPCT:2 in the last 72 hours  Micro Results: No results found for this or any previous visit (from the past 240 hour(s)).  Studies/Results: No results found.  Medications: I have reviewed the patient's current medications. Scheduled Meds:    . benzocaine   Mouth/Throat Once  . enoxaparin (LOVENOX) injection  40 mg Subcutaneous Q24H  . feeding supplement  1 Container Oral BID BM  . metoprolol  5 mg Intravenous Q4H  . pantoprazole (PROTONIX) IV  40 mg Intravenous Q24H  . potassium chloride  20 mEq Oral Once   Continuous Infusions:    . dextrose 5 % and 0.9 % NaCl with KCl 20 mEq/L 100 mL/hr at 07/12/11 2232   PRN Meds:.hydrALAZINE, HYDROcodone-acetaminophen, LORazepam, menthol-cetylpyridinium, morphine, ondansetron (ZOFRAN) IV, ondansetron, phenol, promethazine  Assessment/Plan: SBO (small bowel obstruction)  Admitted and treated conservatively with bowel rest and NG decompression. She was given anti-emetics for nausea. Serial abdominal films have not shown resolution through 07/10/11, but films on 1/21 and 1/22 do show some improvement.  NG tube was discontinued on January 22 of 2013.  Diet advanced to heart healthy diet.  Appreciate surgery's input.   HYPERTENSION  DCed oral meds since she has an NG tube to suction. Given tachycardia, started her on routine metoprolol 07/08/11. BP is now controlled. Will increase metoprolol to Q 4 hour dosing given ongoing tachycardia. Continue hydralazine PRN.  Will transition back to the patient's oral regimen at discharge.  COPD (chronic obstructive pulmonary disease)  Respiratory status stable.   Hypokalemia/Hypomagnesemia Replace when necessary.  ? Paroxysmal atrial fibrillation  She has been noted by the nursing staff to have periods of rapid atrial fibrillation. An EKG on 07/07/11 showed sinus tachycardia with PVCs/ nonspecific ST abnormalities. A repeat EKG was done on 07/08/11,  after an episode of what appeared to be atrial fibrillation and it showed PACs with aberrant conduction and PVCs or fusion complexes. No ASA/coumadin for now in case she needs surgery. On metoprolol to Q 4 hour dosing given elevated HR.   Disposition Discharge patient home.   LOS: 10 days  Yamilett Anastos A, MD 07/13/2011, 1:00 PM

## 2011-07-13 NOTE — Discharge Summary (Signed)
Discharge Summary  Cristina Parker MR#: [id number]  DOB:03-12-1936  Date of Admission: 07/03/2011 Date of Discharge: 07/13/2011  Patient's PCP: Ellsworth Lennox, MD, MD  Attending Physician:Isaiahs Chancy A  Consults: Dr. 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  Discharge Medications Medication List  As of 07/13/2011  1:08 PM   STOP taking these medications         loperamide 2 MG capsule         TAKE these medications         aspirin 81 MG tablet   Take 81 mg by mouth daily.      aspirin-acetaminophen-caffeine 892-119-41 MG per tablet   Commonly known as: EXCEDRIN MIGRAINE   Take 1 tablet by mouth every 8 (eight) hours as needed. For headaches.      atorvastatin 40 MG tablet   Commonly known as: LIPITOR   Take 40 mg by mouth daily.      Calcium Citrate-Vitamin D 250-200 MG-UNIT Tabs   Take 2 tablets by mouth daily.      clopidogrel 75 MG tablet   Commonly known as: PLAVIX   Take 75 mg by mouth daily.      escitalopram 20 MG tablet   Commonly known as: LEXAPRO   Take 20 mg by mouth daily.      feeding supplement Liqd   Take 1 Container by mouth 2 (two) times daily between meals.      HYDROcodone-acetaminophen 5-500 MG per tablet   Commonly known as: VICODIN   Take 1 tablet by mouth every 8 (eight) hours as needed. For pain.      lisinopril-hydrochlorothiazide 10-12.5 MG per tablet   Commonly known as: PRINZIDE,ZESTORETIC   Take 1 tablet by mouth daily.      metoprolol succinate 100 MG 24 hr tablet   Commonly known as: TOPROL-XL   Take 50 mg by mouth daily.      pantoprazole 40 MG tablet   Commonly known as: PROTONIX   Take 40 mg by mouth daily.      vitamin C 1000 MG tablet   Take 1,000 mg by mouth daily.            Hospital Course: SBO (small bowel obstruction)  Admitted and treated conservatively with bowel rest and NG decompression. She was given anti-emetics for nausea. Serial abdominal films have not shown resolution through 07/10/11, but films on 1/21 and 1/22 do show some improvement. NG tube was discontinued on January 22 of 2013.  Surgery was consulted and has been managing the above condition.  Patient was started on a clear liquid diet on January 22 of 2013 and diet was slowly advanced as tolerated.  Patient did not need surgery as she improved with conservative management.  Prior to discharge today patient was tolerating heart healthy diet.    HYPERTENSION  DCed oral meds since she has an NG tube to suction. Given tachycardia, started her on routine metoprolol IV 07/08/11. BP is now controlled. Continue hydralazine PRN.  Transitioned back to the patient's oral regimen at discharge.   COPD (chronic obstructive pulmonary disease)  Respiratory status stable.   Hypokalemia/Hypomagnesemia  Replace when necessary.   Tachycardia  Initially thought the patient had  A. fib.  However on my examination of EKGs and telemetry patient was found to be in sinus tachycardia with frequent PVCs.  Patient did not appear to be in A. fib.  Continue aspirin and Plavix at discharge.   Day of Discharge BP 119/88  Pulse 96  Temp(Src) 97.5 F (36.4 C) (Oral)  Resp 16  Ht 5' (1.524 m)  Wt 53.5 kg (117 lb 15.1 oz)  BMI 23.03 kg/m2  SpO2 97%  Results for orders placed during the hospital encounter of 07/03/11 (from the past 48 hour(s))  BASIC METABOLIC PANEL     Status: Abnormal   Collection Time   07/12/11  4:52 AM      Component Value Range Comment   Sodium 141  135 - 145 (mEq/L)    Potassium 3.4 (*) 3.5 - 5.1 (mEq/L)    Chloride 108  96 - 112 (mEq/L)    CO2 26  19 - 32  (mEq/L)    Glucose, Bld 104 (*) 70 - 99 (mg/dL)    BUN 3 (*) 6 - 23 (mg/dL)    Creatinine, Ser 0.58  0.50 - 1.10 (mg/dL)    Calcium 8.4  8.4 - 10.5 (mg/dL)    GFR calc non Af Amer 89 (*) >90 (mL/min)    GFR calc Af Amer >90  >90 (mL/min)   MAGNESIUM     Status: Abnormal   Collection Time   07/12/11  4:52 AM      Component Value Range Comment   Magnesium 1.4 (*) 1.5 - 2.5 (mg/dL)   BASIC METABOLIC PANEL     Status: Abnormal   Collection Time   07/13/11  6:29 AM      Component Value Range Comment   Sodium 142  135 - 145 (mEq/L)    Potassium 3.6  3.5 - 5.1 (mEq/L)    Chloride 111  96 - 112 (mEq/L)    CO2 25  19 - 32 (mEq/L)    Glucose, Bld 98  70 - 99 (mg/dL)    BUN <3 (*) 6 - 23 (mg/dL) RESULT REPEATED AND VERIFIED   Creatinine, Ser 0.52  0.50 - 1.10 (mg/dL)    Calcium 8.3 (*) 8.4 - 10.5 (mg/dL)    GFR calc non Af Amer >90  >90 (mL/min)    GFR calc Af Amer >90  >90 (mL/min)   MAGNESIUM     Status: Normal   Collection Time   07/13/11  6:29 AM      Component Value Range Comment   Magnesium 1.7  1.5 - 2.5 (mg/dL)     Dg Abd 1 View  07/05/2011  *RADIOLOGY REPORT*  Clinical Data: Follow up small bowel obstruction.  ABDOMEN - 1 VIEW  Comparison: 07/04/2011  Findings: Abnormal small bowel dilatation is again noted.  Small bowel loops measure up to 3.7 cm.  Mild antegrade progression of enteric contrast material is identified with a small amount of contrast within the proximal colon.  A paucity of distal colonic gas is noted.  IMPRESSION:  1.  Persistent abnormal small bowel dilatation consistent with obstruction.  Not significantly improved from previous exam.  Original Report Authenticated By: Angelita Ingles, M.D.   Dg Abd 1 View  07/04/2011  *RADIOLOGY REPORT*  Clinical Data: Nausea, mid abdominal pain, evaluate small bowel obstruction  ABDOMEN - 1 VIEW  Comparison: 07/03/2011; abdominal CT - 07/03/2011  Findings:  The previously ingested enteric contrast remains within mildly dilated  loops of centralized small bowel within the mid  abdomen. Index small bowel loop within left mid hemiabdomen measures approximately 3.1 cm in diameter.  There is a conspicuous paucity of distal colonic gas.  Evaluation for pneumoperitoneum is limited secondary to supine patient positioning and exclusion of the right hemidiaphragm.  No definite pneumatosis or portal venous gas. Multilevel lumbar spine degenerative change.  Post right femoral neck and shaft pinning, incompletely evaluated.  Excreted contrast within the urinary bladder.  IMPRESSION: Findings suggestive of persistent small bowel obstruction.  Original Report Authenticated By: Rachel Moulds, M.D.   Ct Abdomen Pelvis W Contrast  07/03/2011  *RADIOLOGY REPORT*  Clinical Data: This abdominal pain.  History of Crohn's colitis.  CT ABDOMEN AND PELVIS WITH CONTRAST  Technique:  Multidetector CT imaging of the abdomen and pelvis was performed following the standard protocol during bolus administration of intravenous contrast.  Contrast: 169m OMNIPAQUE IOHEXOL 300 MG/ML IV SOLN  Comparison: None.  Findings: Small hiatal hernia.  Contraction of the antrum may be related to peristalsis ( limiting evaluation of this region).  Dilated mid to distal jejunum and proximal and mid ileum which may be related to adhesions with a small amount of free fluid in the pelvis, left anterior pelvic region and perihepatic region.  No free intraperitoneal air.  Colon is predominately collapsed.  Mild lobulated contour of the liver.  Mild cirrhosis may be present. Focal small calcification left lobe liver otherwise no focal hepatic lesion.  No calcified gallstones.  Calcification within the spleen without focal lesion otherwise noted.  No focal pancreatic lesion.  Mild hyperplasia adrenal glands bilaterally.  No renal lesion.  Atherosclerotic type changes of the aorta with moderate to marked narrowing the lower abdominal aorta and proximal common iliac arteries.  Narrowing of  the celiac artery, superior mesenteric artery and renal arteries.  Noncontrast filled views of the urinary bladder without gross abnormality.  Post hysterectomy.  Anterior wedge compression fracture T10 with retropulsion posterior inferior aspect with spinal stenosis and slight cord flattening. This appears remote.  Anterior wedge compression deformity of T12 involving the inferior endplate with mild retropulsion and spur posterior inferior aspect with mild spinal stenosis.  This appears remote.  Scoliosis and degenerative changes throughout the lower thoracic and lumbar spine.  Prior right hip fracture with placement of a dynamic sliding type screw.  The fracture at the base of the right femoral neck is still well visualized as a lucency with prominent bony overgrowth surround this level suggesting nonunion.  IMPRESSION: Small bowel obstruction as discussed above.  Mild lobulated contour of the liver.  Mild cirrhosis may be present.  Atherosclerotic type changes of the aorta with moderate to marked narrowing the lower abdominal aorta and proximal common iliac arteries.  Narrowing of the celiac artery, superior mesenteric artery and renal arteries.  Anterior wedge compression fracture T10 with retropulsion posterior inferior aspect with spinal stenosis and slight cord flattening. This appears remote.  Anterior wedge compression deformity of T12 involving the inferior endplate with mild retropulsion and spur posterior inferior aspect with mild spinal stenosis.  This appears remote.  Scoliosis and degenerative changes throughout the lower thoracic and lumbar spine.  Prior right hip fracture with placement of a dynamic sliding type screw.  The fracture at the base of the right femoral neck is still well visualized as a lucency with prominent bony overgrowth surround this level suggesting nonunion.  Original Report Authenticated By: SDoug Sou M.D.   Dg Abd 2 Views  07/10/2011  *RADIOLOGY REPORT*  Clinical  Data:  Small bowel obstruction.  ABDOMEN - 2 VIEW  Comparison: 07/09/2011 and CT chest 02/20/2011.  Findings: Nasogastric tube tip projects in the region of the pylorus.  There are dilated loops of small bowel in the central abdomen, with associated air fluid levels.  Gas is seen in nondistended colon.  Incidental imaging of the lower chest shows added density in the right upper lung zone, corresponding to scarring on 02/20/2011. Mild bibasilar atelectasis, left greater than right, with tiny bilateral pleural effusions.  Scoliosis and spondylosis.  IMPRESSION:  1.  Continued improvement in small bowel obstruction, without resolution. 2.  Bibasilar atelectasis and tiny bilateral pleural effusions.  Original Report Authenticated By: Luretha Rued, M.D.   Dg Abd 2 Views  07/09/2011  *RADIOLOGY REPORT*  Clinical Data: Follow up small bowel obstruction  ABDOMEN - 2 VIEW  Comparison: 07/08/2011  Findings: A nasogastric tube is in place.  The tip is in the expected location of the proximal duodenum.  The side port is well below the GE junction.  Again noted are multiple dilated loops of small bowel and air-fluid levels.  The appearance is stable to slightly improved in the interval.  No new findings identified.  There is a right sided dynamic hip screw in place.  IMPRESSION:  1.  Stable to slight improvement in small bowel obstruction pattern.  Original Report Authenticated By: Angelita Ingles, M.D.   Dg Abd 2 Views  07/08/2011  *RADIOLOGY REPORT*  Clinical Data: Follow up SBO  ABDOMEN - 2 VIEW  Comparison: 07/07/2011  Findings: Mildly dilated loops of small bowel in the right mid abdomen, compatible with small bowel obstruction, grossly unchanged.  Small amount of residual contrast in the cecum.  Enteric tube looped in the stomach.  Degenerate changes of the visualized thoracolumbar spine.  ORIF of the proximal right femur.  IMPRESSION: Stable small bowel obstruction.  Original Report Authenticated By: Julian Hy, M.D.   Dg Abd 2 Views  07/07/2011  *RADIOLOGY REPORT*  Clinical Data: Abdominal pain, follow-up small bowel obstruction  ABDOMEN - 2 VIEW  Comparison: 07/06/2011  Findings: Again seen are dilated loops of small bowel in the mid abdomen, compatible with small bowel obstruction.  Contrast has progressed into the cecum.  Enteric tube looped in the gastric cardia.  Heart is top normal in size. Postsurgical changes related to prior CABG.  Degenerative changes of the visualized thoracolumbar spine.  IMPRESSION: Findings compatible with small bowel obstruction.  Contrast has progressed into the cecum.  Original Report Authenticated By: Julian Hy, M.D.   Dg Abd 2 Views  07/06/2011   *RADIOLOGY REPORT*  Clinical Data: Follow-up small bowel obstruction.  Now with nasogastric tube in place  ABDOMEN - 2 VIEW  Comparison: 01/17 to the  Findings: The patient reportedly has a nasogastric tube in place but this tube is not visualized on this exam.  The upper right portion of the exam images to the level of the carina. Persistent small bowel loop dilatation is noted and a slight increase in caliber since the previous exam is seen with the largest loop measuring 4.6 cm in diameter.  Air fluid levels are identified on the upright film and residual contrast from the recent CT has not progressed from the distal small bowel and cecum. No distal colonic gas is evident.  Findings are compatible persistent small bowel obstruction.  The lung bases appear clear.  IMPRESSION: Reported nasogastric tube is not visualized on today's exam with imaging to the level of the  carina on the upright KUB.  This suggests either removal or high positioning of the nasogastric tube.  Persistent small bowel obstructive appearance with slight interval increase in caliber of dilated small bowel loops  These results will be called to the ordering clinician or representative by the Radiologist Assistant, and communication documented in the  PACS Dashboard.  Original Report Authenticated By: Ander Gaster, M.D.   Dg Abd Acute W/chest  07/03/2011  *RADIOLOGY REPORT*  Clinical Data: Abdomen and chest pain  ACUTE ABDOMEN SERIES (ABDOMEN 2 VIEW & CHEST 1 VIEW)  Comparison: 04/04/2011  Findings: Prior median sternotomy and CABG procedure.  Stable right upper lobe scarring.  No airspace consolidation identified.  The bowel gas pattern is abnormal.  There are dilated loops of small bowel and multiple air- fluid levels consistent with small bowel obstruction.  The small bowel loops measure up to 3.2 cm.  Scoliosis deformity involves the lumbar spine with convexity towards the right.  IMPRESSION:  1.  Findings consistent with small bowel obstruction. 2.  Right upper lobe scarring.  Original Report Authenticated By: Angelita Ingles, M.D.   Dg Abd Portable 1v  07/06/2011  *RADIOLOGY REPORT*  Clinical Data: Evaluate NG tube positioning  PORTABLE ABDOMEN - 1 VIEW  Comparison: 07/06/2011  Findings: Nasogastric tube is coiled within the stomach.  The tip of the tube is oriented proximally.  Dilated loops of the small bowel are again noted.  Not significantly improved from previous exam.  Distal small bowel enteric contrast material is unchanged.  IMPRESSION:  1. Nasogastric tube tip is within the lumen of the stomach and oriented proximally.  Original Report Authenticated By: Angelita Ingles, M.D.   Disposition: Home  Diet: Heart healthy diet.  Activity: Resume as tolerated.   Follow-up Appts: Discharge Orders    Future Appointments: Provider: Department: Dept Phone: Center:   10/30/2011 10:30 AM [facility] [phone] [facility]   10/30/2011 11:30 AM Rosetta Posner, MD [facility] [phone] [facility]     Future Orders Please Complete By Expires   Diet - low sodium heart healthy      Increase activity slowly      Discharge instructions      Comments:   Followup with Ellsworth Lennox, MD (PCP) in 1 week.       TESTS THAT  NEED FOLLOW-UP None  Time spent on discharge, talking to the patient, and coordinating care: 35 mins.   Signed: Bynum Bellows, MD 07/13/2011, 1:08 PM

## 2011-07-13 NOTE — Progress Notes (Signed)
Discharge patient to home, alert and oriented, ambulatory, no complaints of pain or discomfort. PIV removed no s/s of infiltration , no swelling noted. Discharge instructions , follow up appointments given to patient verbalized understanding.

## 2011-07-13 NOTE — Progress Notes (Signed)
Patient ID: LASHARN BUFKIN, female   DOB: 1936-04-20, 76 y.o.   MRN: [id number]    Subjective: Pt conts to do great.  Tolerating full liquids without difficulty.  No nausea, +BM  Objective: Vital signs in last 24 hours: Temp:  [97.5 F (36.4 C)-97.7 F (36.5 C)] 97.5 F (36.4 C) (01/25 0512) Pulse Rate:  [93-119] 95  (01/25 0512) Resp:  [16-18] 16  (01/25 0512) BP: (108-137)/(72-91) 108/76 mmHg (01/25 0512) SpO2:  [96 %-99 %] 97 % (01/25 0512) Weight:  [117 lb 15.1 oz (53.5 kg)] 117 lb 15.1 oz (53.5 kg) (01/25 0512) Last BM Date: 07/11/11  Intake/Output from previous day: 01/24 0701 - 01/25 0700 In: 2160 [P.O.:960; I.V.:1200] Out: 1302 [Urine:1300; Stool:2] Intake/Output this shift:    PE: Abd: soft, NT, ND, +BS   Lab Results:   [facility] 07/11/11 0520  WBC 9.4  HGB 10.7*  HCT 32.8*  PLT 226   BMET  Basename 07/13/11 0629 07/12/11 0452  NA 142 141  K 3.6 3.4*  CL 111 108  CO2 25 26  GLUCOSE 98 104*  BUN <3* 3*  CREATININE 0.52 0.58  CALCIUM 8.3* 8.4   PT/INR No results found for this basename: LABPROT:2,INR:2 in the last 72 hours   Studies/Results: No results found.  Anti-infectives: Anti-infectives    None       Assessment/Plan  1. SBO  Plan: 1. It appears to the patient's SBO has resolved. I will advance her to solid diet today.  If she tolerates this, from a surgical standpoint, she may be d/c home later this afternoon.   LOS: 10 days    Masahiro Iglesia E 07/13/2011

## 2011-07-25 ENCOUNTER — Other Ambulatory Visit: Payer: Self-pay | Admitting: Family Medicine

## 2011-07-25 DIAGNOSIS — Z1231 Encounter for screening mammogram for malignant neoplasm of breast: Secondary | ICD-10-CM

## 2011-08-22 ENCOUNTER — Telehealth: Payer: Self-pay

## 2011-08-22 MED ORDER — HYDROCODONE-ACETAMINOPHEN 5-500 MG PO TABS: 1.0000 | ORAL_TABLET | Freq: Three times a day (TID) | ORAL | Status: DC | PRN

## 2011-08-22 NOTE — Telephone Encounter (Signed)
Patient notified rx called in.

## 2011-08-22 NOTE — Telephone Encounter (Signed)
Pt is requesting hydrocodone refill

## 2011-08-23 ENCOUNTER — Ambulatory Visit
Admission: RE | Admit: 2011-08-23 | Discharge: 2011-08-23 | Disposition: A | Discharge status: Home / Self Care | Payer: Medicare Other | Source: Ambulatory Visit | Attending: Family Medicine | Admitting: Family Medicine

## 2011-08-23 DIAGNOSIS — Z1231 Encounter for screening mammogram for malignant neoplasm of breast: Secondary | ICD-10-CM

## 2011-08-24 ENCOUNTER — Ambulatory Visit (INDEPENDENT_AMBULATORY_CARE_PROVIDER_SITE_OTHER): Payer: Medicare Other | Admitting: Family Medicine

## 2011-08-24 DIAGNOSIS — E785 Hyperlipidemia, unspecified: Secondary | ICD-10-CM

## 2011-08-24 DIAGNOSIS — D649 Anemia, unspecified: Secondary | ICD-10-CM

## 2011-08-24 DIAGNOSIS — I10 Essential (primary) hypertension: Secondary | ICD-10-CM

## 2011-08-24 DIAGNOSIS — Z0000 Encounter for general adult medical examination without abnormal findings: Secondary | ICD-10-CM

## 2011-08-24 LAB — COMPREHENSIVE METABOLIC PANEL
ALT: 16 U/L (ref 0–35)
AST: 18 U/L (ref 0–37)
Albumin: 4.2 g/dL (ref 3.5–5.2)
BUN: 19 mg/dL (ref 6–23)
CO2: 25 mEq/L (ref 19–32)
Calcium: 9.3 mg/dL (ref 8.4–10.5)
Chloride: 105 mEq/L (ref 96–112)
Potassium: 4.2 mEq/L (ref 3.5–5.3)

## 2011-08-24 LAB — POCT URINALYSIS DIPSTICK
Bilirubin, UA: NEGATIVE
Blood, UA: NEGATIVE
Glucose, UA: NEGATIVE
Nitrite, UA: NEGATIVE
Spec Grav, UA: 1.015

## 2011-08-24 MED ORDER — LISINOPRIL-HYDROCHLOROTHIAZIDE 10-12.5 MG PO TABS: 1.0000 | ORAL_TABLET | Freq: Every day | ORAL | Status: DC

## 2011-08-24 MED ORDER — HYDROCODONE-ACETAMINOPHEN 5-500 MG PO TABS: 1.0000 | ORAL_TABLET | Freq: Three times a day (TID) | ORAL | Status: DC | PRN

## 2011-08-24 MED ORDER — ATORVASTATIN CALCIUM 40 MG PO TABS: 40.0000 mg | ORAL_TABLET | Freq: Every day | ORAL | Status: DC

## 2011-08-24 MED ORDER — ESCITALOPRAM OXALATE 20 MG PO TABS: 20.0000 mg | ORAL_TABLET | Freq: Every day | ORAL | Status: DC

## 2011-08-24 MED ORDER — CLOPIDOGREL BISULFATE 75 MG PO TABS: 75.0000 mg | ORAL_TABLET | Freq: Every day | ORAL | Status: DC

## 2011-08-24 MED ORDER — PANTOPRAZOLE SODIUM 40 MG PO TBEC: 40.0000 mg | DELAYED_RELEASE_TABLET | Freq: Every day | ORAL | Status: DC

## 2011-08-24 MED ORDER — METOPROLOL SUCCINATE ER 100 MG PO TB24: 50.0000 mg | ORAL_TABLET | Freq: Every day | ORAL | Status: DC

## 2011-08-24 NOTE — Patient Instructions (Signed)
Anemia, Nonspecific Your exam and blood tests show you are anemic. This means your blood (hemoglobin) level is low. Normal hemoglobin values are 12 to 15 g/dL for females and 14 to 17 g/dL for males. Make a note of your hemoglobin level today. The hematocrit percent is also used to measure anemia. A normal hematocrit is 38% to 46% in females and 42% to 49% in males. Make a note of your hematocrit level today. CAUSES  Anemia can be due to many different causes.  Excessive bleeding from periods (in women).   Intestinal bleeding.   Poor nutrition.   Kidney, thyroid, liver, and bone marrow diseases.  SYMPTOMS  Anemia can come on suddenly (acute). It can also come on slowly. Symptoms can include:  Minor weakness.   Dizziness.   Palpitations.   Shortness of breath.  Symptoms may be absent until half your hemoglobin is missing if it comes on slowly. Anemia due to acute blood loss from an injury or internal bleeding may require blood transfusion if the loss is severe. Hospital care is needed if you are anemic and there is significant continual blood loss. TREATMENT   Stool tests for blood (Hemoccult) and additional lab tests are often needed. This determines the best treatment.   Further checking on your condition and your response to treatment is very important. It often takes many weeks to correct anemia.  Depending on the cause, treatment can include:  Supplements of iron.   Vitamins V76 and folic acid.   Hormone medicines.If your anemia is due to bleeding, finding the cause of the blood loss is very important. This will help avoid further problems.  SEEK IMMEDIATE MEDICAL CARE IF:   You develop fainting, extreme weakness, shortness of breath, or chest pain.   You develop heavy vaginal bleeding.   You develop bloody or black, tarry stools or vomit up blood.   You develop a high fever, rash, repeated vomiting, or dehydration.  Document Released: 07/12/2004 Document  Revised: 05/24/2011 Document Reviewed: 04/19/2009 [facility] Patient Information 2012 [place]   .Keeping You Healthy  Get These Tests  Blood Pressure- Have your blood pressure checked by your healthcare provider at least once a year.  Normal blood pressure is 120/80.  Weight- Have your body mass index (BMI) calculated to screen for obesity.  BMI is a measure of body fat based on height and weight.  You can calculate your own BMI at [employer]  Cholesterol- Have your cholesterol checked every year.  Diabetes- Have your blood sugar checked every year if you have high blood pressure, high cholesterol, a family history of diabetes or if you are overweight.  Pap Smear- Have a pap smear every 1 to 3 years if you have been sexually active.  If you are older than 65 and recent pap smears have been normal you may not need additional pap smears.  In addition, if you have had a hysterectomy  For benign disease additional pap smears are not necessary.  Mammogram-Yearly mammograms are essential for early detection of breast cancer  Screening for Colon Cancer- Colonoscopy starting at age 79. Screening may begin sooner depending on your family history and other health conditions.  Follow up colonoscopy as directed by your Gastroenterologist.  Screening for Osteoporosis- Screening begins at age 52 with bone density scanning, sooner if you are at higher risk for developing Osteoporosis.  Get these medicines  Calcium with Vitamin D- Your body requires 1200-1500 mg of Calcium a day and [phone] IU  of Vitamin D a day.  You can only absorb 500 mg of Calcium at a time therefore Calcium must be taken in 2 or 3 separate doses throughout the day.  Hormones- Hormone therapy has been associated with increased risk for certain cancers and heart disease.  Talk to your healthcare provider about if you need relief from menopausal symptoms.  Aspirin- Ask your healthcare provider about taking  Aspirin to prevent Heart Disease and Stroke.  Get these Immuniztions  Flu shot- Every fall  Pneumonia shot- Once after the age of 69; if you are younger ask your healthcare provider if you need a pneumonia shot.  Tetanus- Every ten years.  Zostavax- Once after the age of 61 to prevent shingles.  Take these steps  Don't smoke- Your healthcare provider can help you quit. For tips on how to quit, ask your healthcare provider or go to www.smokefree.gov or call 1-800 QUIT-NOW.  Be physically active- Exercise 5 days a week for a minimum of 30 minutes.  If you are not already physically active, start slow and gradually work up to 30 minutes of moderate physical activity.  Try walking, dancing, bike riding, swimming, etc.  Eat a healthy diet- Eat a variety of healthy foods such as fruits, vegetables, whole grains, low fat milk, low fat cheeses, yogurt, lean meats, chicken, fish, eggs, dried beans, tofu, etc.  For more information go to www.thenutritionsource.org  Dental visit- Brush and floss teeth twice daily; visit your dentist twice a year.  Eye exam- Visit your Optometrist or Ophthalmologist yearly.  Drink alcohol in moderation- Limit alcohol intake to one drink or less a day.  Never drink and drive.  Depression- Your emotional health is as important as your physical health.  If you're feeling down or losing interest in things you normally enjoy, please talk to your healthcare provider.  Seat Belts- can save your life; always wear one  Smoke/Carbon Monoxide detectors- These detectors need to be installed on the appropriate level of your home.  Replace batteries at least once a year.  Violence- If anyone is threatening or hurting you, please tell your healthcare provider.  Living Will/ Health care power of attorney- Discuss with your healthcare provider and family.

## 2011-08-25 LAB — CBC WITH DIFFERENTIAL/PLATELET
Eosinophils Relative: 3 % (ref 0–5)
HCT: 37.5 % (ref 36.0–46.0)
Lymphocytes Relative: 15 % (ref 12–46)
Lymphs Abs: 1.2 10*3/uL (ref 0.7–4.0)
MCV: 100.3 fL — ABNORMAL HIGH (ref 78.0–100.0)
Monocytes Absolute: 0.4 10*3/uL (ref 0.1–1.0)
Monocytes Relative: 5 % (ref 3–12)
RBC: 3.74 MIL/uL — ABNORMAL LOW (ref 3.87–5.11)
WBC: 8.2 10*3/uL (ref 4.0–10.5)

## 2011-08-31 ENCOUNTER — Encounter: Payer: Self-pay | Admitting: Radiology

## 2011-08-31 NOTE — Progress Notes (Signed)
Quick Note:  Please notify pt that results are normal.   Provide pt with copy of labs. ______ 

## 2011-09-03 ENCOUNTER — Encounter: Payer: Self-pay | Admitting: Family Medicine

## 2011-09-03 NOTE — Progress Notes (Signed)
  Subjective:    Patient ID: Cristina Parker, female    DOB: 1935-08-11, 76 y.o.   MRN: [id number]  HPI   This 76 y.o. Cauc female is here forAnnual Wellness Visit- Subsequent (Medicare). She has Cardiovascular  Disease which is monitored by Dr. Johnsie Cancel and Dr. Sharlett Iles follows her for treatment of Colitis. She has also  seen Dr. Sherren Mocha Early re: Vascular problems. She is a widow and has 2 adult children. She consumes some wine  on a daily basis and is a non-smoker. Retired from [employer], she tries to remain active with daily exercise.  Last eye exam: 2012; last dental exam: 03/2011  The pt sees an Ophthalmologist for monitoring of a chronic eye condition.  Pt reports Colonoscopy in 2011 (abnormal)    Review of Systems  HENT: Positive for rhinorrhea and sneezing.   Respiratory: Positive for shortness of breath.   Cardiovascular: Negative for chest pain and palpitations.  Gastrointestinal:       Heartburn/ indigestion; Hemorrhoids  Musculoskeletal: Positive for arthralgias.       Stiffness in fingers,hands, shoulders  Neurological: Positive for weakness and numbness.       Occasional numbness in fingers  All other systems reviewed and are negative.       Objective:   Physical Exam  Vitals reviewed. Constitutional: She is oriented to person, place, and time. She appears well-developed and well-nourished. No distress.  HENT:  Head: Normocephalic and atraumatic.  Right Ear: External ear normal.  Left Ear: External ear normal.  Nose: Nose normal.  Mouth/Throat: Oropharynx is clear and moist.  Eyes: Conjunctivae and EOM are normal. No scleral icterus.  Neck: Normal range of motion. Neck supple. No JVD present. No thyromegaly present.       Carotid bruits bilat.- moderately loud  Cardiovascular: Normal rate and regular rhythm.   Murmur heard. Pulmonary/Chest: Effort normal and breath sounds normal. No respiratory distress. She has no wheezes. She has no rales.    Abdominal: Soft. Bowel sounds are normal. She exhibits no distension and no mass. There is no tenderness.  Genitourinary:       GU exam not done.  Musculoskeletal: She exhibits no edema.       Upper ext: moderate degree of deg. changes noted but  good ROM preserved. No significant redness or warmth but + swelling at DIP and PIP joints  Lymphadenopathy:    She has no cervical adenopathy.  Neurological: She is alert and oriented to person, place, and time. She has normal reflexes. No cranial nerve deficit. Coordination normal.  Skin: Skin is warm and dry.  Psychiatric: She has a normal mood and affect. Her behavior is normal. Judgment and thought content normal.    BECK'S DEPRESSION INVENTORY 9scanned into record):  Score= 11 ( mild depression  11-16)      Assessment & Plan:   1. Medicare annual wellness visit, subsequent  Continue all current medications  2. Anemia  CBC with Differential  3. HTN (hypertension)  Comprehensive metabolic panel  4. Hyperlipidemia  Lipid panel, TSH

## 2011-10-29 ENCOUNTER — Encounter: Payer: Self-pay | Admitting: Neurosurgery

## 2011-10-29 ENCOUNTER — Other Ambulatory Visit: Payer: Self-pay | Admitting: *Deleted

## 2011-10-29 DIAGNOSIS — Z48812 Encounter for surgical aftercare following surgery on the circulatory system: Secondary | ICD-10-CM

## 2011-10-29 DIAGNOSIS — I6529 Occlusion and stenosis of unspecified carotid artery: Secondary | ICD-10-CM

## 2011-10-30 ENCOUNTER — Other Ambulatory Visit: Payer: Self-pay

## 2011-10-30 ENCOUNTER — Ambulatory Visit: Payer: Self-pay | Admitting: Neurosurgery

## 2011-11-13 ENCOUNTER — Other Ambulatory Visit: Payer: Medicare Other

## 2011-11-13 ENCOUNTER — Ambulatory Visit: Payer: Medicare Other | Admitting: Neurosurgery

## 2011-11-14 ENCOUNTER — Encounter: Payer: Self-pay | Admitting: Neurosurgery

## 2011-11-15 ENCOUNTER — Ambulatory Visit (INDEPENDENT_AMBULATORY_CARE_PROVIDER_SITE_OTHER): Payer: Medicare Other | Admitting: Vascular Surgery

## 2011-11-15 ENCOUNTER — Other Ambulatory Visit (INDEPENDENT_AMBULATORY_CARE_PROVIDER_SITE_OTHER): Payer: Medicare Other | Admitting: *Deleted

## 2011-11-15 ENCOUNTER — Encounter: Payer: Self-pay | Admitting: Vascular Surgery

## 2011-11-15 VITALS — BP 130/82 | HR 79 | Resp 18 | Ht 60.0 in | Wt 118.0 lb

## 2011-11-15 DIAGNOSIS — Z48812 Encounter for surgical aftercare following surgery on the circulatory system: Secondary | ICD-10-CM

## 2011-11-15 DIAGNOSIS — I6529 Occlusion and stenosis of unspecified carotid artery: Secondary | ICD-10-CM

## 2011-11-15 NOTE — Progress Notes (Signed)
The patient presents today for followup of her extensive peripheral vascular occlusive disease. She has a long history of dating back to 1999 where she underwent a left carotid endarterectomy and then underwent a aortic to innominate bypass in 2005 with Dr. Amedeo Plenty. She had progressive asymptomatic right carotid disease and underwent an uneventful right carotid endarterectomy by myself in October of 2012. She is here today for routine followup duplex surveillance. She remains in good health. She denies any new cardiac difficulties and denies any neurologic deficits. She is left-handed.  Past Medical History  Diagnosis Date  . Heart disease   . Crohn's disease   . Duodenal stricture   . Esophageal reflux   . Hypertension   . Hyperlipemia   . Hiatal hernia   . Pneumonia   . Anemia   . Depression   . Collagenous colitis   . Anxiety   . Paroxysmal a-fib 07/09/2011    History  Substance Use Topics  . Smoking status: Former Smoker    Types: Cigarettes    Quit date: 06/18/1996  . Smokeless tobacco: Never Used  . Alcohol Use: 3.5 - 7.0 oz/week    7-14 drink(s) per week    Family History  Problem Relation Age of Onset  . Colon polyps Father   . Heart disease Father   . Stroke Father   . Colon cancer Neg Hx     No Known Allergies  Current outpatient prescriptions:Ascorbic Acid (VITAMIN C) 1000 MG tablet, Take 1,000 mg by mouth daily.  , Disp: , Rfl: ;  aspirin 81 MG tablet, Take 81 mg by mouth daily.  , Disp: , Rfl: ;  aspirin-acetaminophen-caffeine (EXCEDRIN MIGRAINE) 250-250-65 MG per tablet, Take 1 tablet by mouth every 8 (eight) hours as needed. For headaches., Disp: , Rfl:  atorvastatin (LIPITOR) 40 MG tablet, Take 1 tablet (40 mg total) by mouth daily., Disp: 90 tablet, Rfl: 3;  Calcium Citrate-Vitamin D 250-200 MG-UNIT TABS, Take 2 tablets by mouth daily.  , Disp: , Rfl: ;  clopidogrel (PLAVIX) 75 MG tablet, Take 1 tablet (75 mg total) by mouth daily., Disp: 90 tablet, Rfl: 3;   escitalopram (LEXAPRO) 20 MG tablet, Take 1 tablet (20 mg total) by mouth daily., Disp: 90 tablet, Rfl: 3 feeding supplement (RESOURCE BREEZE) LIQD, Take 1 Container by mouth 2 (two) times daily between meals., Disp: , Rfl: ;  HYDROcodone-acetaminophen (VICODIN) 5-500 MG per tablet, Take 1 tablet by mouth every 8 (eight) hours as needed. For pain., Disp: 90 tablet, Rfl: 3;  lisinopril-hydrochlorothiazide (PRINZIDE,ZESTORETIC) 10-12.5 MG per tablet, Take 1 tablet by mouth daily., Disp: 90 tablet, Rfl: 3 metoprolol succinate (TOPROL-XL) 100 MG 24 hr tablet, Take 1 tablet (100 mg total) by mouth daily., Disp: 90 tablet, Rfl: 3;  pantoprazole (PROTONIX) 40 MG tablet, Take 1 tablet (40 mg total) by mouth daily., Disp: 90 tablet, Rfl: 3;  Prenatal Vit-Fe Fumarate-FA (MULTIVITAMIN-PRENATAL) 27-0.8 MG TABS, Take 1 tablet by mouth daily., Disp: , Rfl:   BP 130/82  Pulse 79  Resp 18  Ht 5' (1.524 m)  Wt 118 lb (53.524 kg)  BMI 23.05 kg/m2  Body mass index is 23.05 kg/(m^2).       Physical exam well-developed thin white female in no acute distress. She is grossly intact neurologically. She has a 2+ right radial and 1+ left radial pulse. Her neck incisions are well healed. She has harsh carotid bruits bilaterally.  Carotid duplex today was reviewed with the patient. This shows a widely patent right  endarterectomy. It appears that she has had an occlusion of her left common carotid artery since our last study. She did have a known high-grade stenosis at the origin of her left common carotid artery from the arch. Does appear that she has patency of her aorta innominate bypass by duplex.  I discussed this finding with the patient. I have recommended that we proceed with CT and for further evaluation. I explained if she indeed has occluded her common carotid artery that this is a stable finding it would not require specific treatment. We will see her again in one to 2 weeks with CT angiogram of her arch  carotid arteries

## 2011-11-20 ENCOUNTER — Other Ambulatory Visit: Payer: Self-pay | Admitting: Vascular Surgery

## 2011-11-20 LAB — CREATININE, SERUM: Creat: 1.02 mg/dL (ref 0.50–1.10)

## 2011-11-20 LAB — BUN: BUN: 31 mg/dL — ABNORMAL HIGH (ref 6–23)

## 2011-11-22 NOTE — Procedures (Unsigned)
CAROTID DUPLEX EXAM  INDICATION:  Bilateral CEA.  HISTORY: Diabetes:  No. Cardiac:  No. Hypertension:  Yes. Smoking:  Previous. Previous Surgery:  Aorto-innominate bypass graft 07/15/2003; left CEA 1999; right CEA 04/11/2011. CV History: Amaurosis Fugax No, Paresthesias No, Hemiparesis No                                      RIGHT             LEFT Brachial systolic pressure: Brachial Doppler waveforms: Vertebral direction of flow:        Abnormal antegrade                  To-fro DUPLEX VELOCITIES (cm/sec) CCA peak systolic                   211               Occluded ECA peak systolic                   122               Retrograde ICA peak systolic                   171               Minimal ICA end diastolic                   38                0 PLAQUE MORPHOLOGY:                  Heterogenous      Subacute PLAQUE AMOUNT:                      Minimal           100% PLAQUE LOCATION:                    CEA/subclavian    CCA/subclavian  IMPRESSION: 1. Right carotid endarterectomy is patent without evidence of re-     stenosis. 2. Right common carotid artery is patent with no stenosis observed;     however, waveforms are markedly turbulent, which may indicate     proximal stenosis.  Given the patient's aorto-innominate graft,     clinical correlation may be warranted. 3. Left common carotid artery appears occluded, which is a new     finding. 4. Left internal carotid artery/carotid endarterectomy is patent with     minimal flow via a reversed external carotid artery. 5. Bilateral subclavian arteries are significantly stenosed with     abnormal vertebral flow bilaterally.       ___________________________________________ Rosetta Posner, M.D.  LT/MEDQ  D:  11/15/2011  T:  11/15/2011  Job:  [id number]

## 2011-11-26 ENCOUNTER — Encounter: Payer: Self-pay | Admitting: Vascular Surgery

## 2011-11-27 ENCOUNTER — Ambulatory Visit (INDEPENDENT_AMBULATORY_CARE_PROVIDER_SITE_OTHER): Payer: Medicare Other | Admitting: Vascular Surgery

## 2011-11-27 ENCOUNTER — Encounter: Payer: Self-pay | Admitting: Vascular Surgery

## 2011-11-27 ENCOUNTER — Ambulatory Visit
Admission: RE | Admit: 2011-11-27 | Discharge: 2011-11-27 | Disposition: A | Discharge status: Home / Self Care | Payer: Medicare Other | Source: Ambulatory Visit | Attending: Vascular Surgery | Admitting: Vascular Surgery

## 2011-11-27 VITALS — BP 101/73 | HR 79 | Resp 16 | Ht 60.0 in | Wt 119.1 lb

## 2011-11-27 DIAGNOSIS — I6529 Occlusion and stenosis of unspecified carotid artery: Secondary | ICD-10-CM

## 2011-11-27 MED ORDER — IOHEXOL 350 MG/ML SOLN
100.0000 mL | Freq: Once | INTRAVENOUS | Status: AC | PRN
  Administered 2011-11-27: 100 mL via INTRAVENOUS

## 2011-11-27 NOTE — Progress Notes (Signed)
The patient presents today for followup of her extensive professor occlusive disease. I had seen her on May 30 with duplex suggesting occlusion of her left common carotid artery. She had a known stenosis of this proximally. She was scheduled for CT angiogram and had this today and is back today for discussion. She remains asymptomatic from a neurologic standpoint. Her history is otherwise unchanged  Past Medical History  Diagnosis Date  . Heart disease   . Crohn's disease   . Duodenal stricture   . Esophageal reflux   . Hypertension   . Hyperlipemia   . Hiatal hernia   . Pneumonia   . Anemia   . Depression   . Collagenous colitis   . Anxiety   . Paroxysmal a-fib 07/09/2011  . Peripheral vascular disease     History  Substance Use Topics  . Smoking status: Former Smoker    Types: Cigarettes    Quit date: 06/18/1996  . Smokeless tobacco: Never Used  . Alcohol Use: 3.5 - 7.0 oz/week    7-14 drink(s) per week    Family History  Problem Relation Age of Onset  . Colon polyps Father   . Heart disease Father   . Stroke Father   . Colon cancer Neg Hx     No Known Allergies  Current outpatient prescriptions:Ascorbic Acid (VITAMIN C) 1000 MG tablet, Take 1,000 mg by mouth daily.  , Disp: , Rfl: ;  aspirin 81 MG tablet, Take 81 mg by mouth daily.  , Disp: , Rfl: ;  atorvastatin (LIPITOR) 40 MG tablet, Take 1 tablet (40 mg total) by mouth daily., Disp: 90 tablet, Rfl: 3;  Calcium Citrate-Vitamin D 250-200 MG-UNIT TABS, Take 2 tablets by mouth daily.  , Disp: , Rfl:  clopidogrel (PLAVIX) 75 MG tablet, Take 1 tablet (75 mg total) by mouth daily., Disp: 90 tablet, Rfl: 3;  escitalopram (LEXAPRO) 20 MG tablet, Take 1 tablet (20 mg total) by mouth daily., Disp: 90 tablet, Rfl: 3;  feeding supplement (RESOURCE BREEZE) LIQD, Take 1 Container by mouth 2 (two) times daily between meals., Disp: , Rfl:  HYDROcodone-acetaminophen (VICODIN) 5-500 MG per tablet, Take 1 tablet by mouth every 8 (eight)  hours as needed. For pain., Disp: 90 tablet, Rfl: 3;  lisinopril-hydrochlorothiazide (PRINZIDE,ZESTORETIC) 10-12.5 MG per tablet, Take 1 tablet by mouth daily., Disp: 90 tablet, Rfl: 3;  metoprolol succinate (TOPROL-XL) 100 MG 24 hr tablet, Take 1 tablet (100 mg total) by mouth daily., Disp: 90 tablet, Rfl: 3 pantoprazole (PROTONIX) 40 MG tablet, Take 1 tablet (40 mg total) by mouth daily., Disp: 90 tablet, Rfl: 3;  Prenatal Vit-Fe Fumarate-FA (MULTIVITAMIN-PRENATAL) 27-0.8 MG TABS, Take 1 tablet by mouth daily., Disp: , Rfl: ;  aspirin-acetaminophen-caffeine (EXCEDRIN MIGRAINE) 250-250-65 MG per tablet, Take 1 tablet by mouth every 8 (eight) hours as needed. For headaches., Disp: , Rfl:  No current facility-administered medications for this visit. Facility-Administered Medications Ordered in Other Visits: iohexol (OMNIPAQUE) 350 MG/ML injection 100 mL, 100 mL, Intravenous, Once PRN, Medication Radiologist, MD, 100 mL at 11/27/11 0917  BP 101/73  Pulse 79  Resp 16  Ht 5' (1.524 m)  Wt 119 lb 1.6 oz (54.023 kg)  BMI 23.26 kg/m2  Body mass index is 23.26 kg/(m^2).       Physical exam well-developed well-nourished white female in no acute distress. She is grossly intact neurologically. She has palpable but diminished radial pulses bilaterally. She has bilateral carotid incisions which are well healed.  CT scan shows extensive calcification  in the supra-aortic trunk. She does have a patent arch to innominate bypass she does have extensive calcification in the subclavian and proximal left common carotid artery. There is some contrast in her common carotid artery on the left and no evidence of flow limiting stenosis in her carotid bifurcation on the left. She is widely patent endarterectomy on the right.  Impression and plan: Extensive supra-aortic peripheral vascular occlusive disease with a patent arch to innominate bypass. She does have a probable patency of her common carotid artery on the left  with a markedly diminished flow with probable proximal stenosis. I discussed this at length with the patient. I explained that with her extensive left subclavian disease as she would not be a candidate for subclavian carotid bypass. I explained Kiyon Fidalgo option would be bypass of her arch. I explained that certainly would be a very large magnitude procedure for asymptomatic disease. I have recommended against this feeling that the morbidity associated with this far out seeds the potential for neurologic deficits related to her external carotid stenosis. She will notify should she develop any new symptoms otherwise will be seen again in 6 months with repeat carotid duplex in our office

## 2011-11-28 NOTE — Addendum Note (Signed)
Addended by: Mena Goes on: 11/28/2011 09:56 AM   Modules accepted: Orders

## 2011-11-30 ENCOUNTER — Ambulatory Visit: Payer: Medicare Other | Admitting: Cardiovascular Disease

## 2011-12-19 ENCOUNTER — Encounter: Payer: Self-pay | Admitting: Cardiovascular Disease

## 2011-12-19 ENCOUNTER — Ambulatory Visit (INDEPENDENT_AMBULATORY_CARE_PROVIDER_SITE_OTHER): Payer: Medicare Other | Admitting: Cardiovascular Disease

## 2011-12-19 VITALS — BP 122/77 | HR 67 | Wt 119.0 lb

## 2011-12-19 DIAGNOSIS — I480 Paroxysmal atrial fibrillation: Secondary | ICD-10-CM

## 2011-12-19 DIAGNOSIS — R011 Cardiac murmur, unspecified: Secondary | ICD-10-CM

## 2011-12-19 DIAGNOSIS — I359 Nonrheumatic aortic valve disorder, unspecified: Secondary | ICD-10-CM

## 2011-12-19 DIAGNOSIS — I4891 Unspecified atrial fibrillation: Secondary | ICD-10-CM

## 2011-12-19 DIAGNOSIS — I350 Nonrheumatic aortic (valve) stenosis: Secondary | ICD-10-CM

## 2011-12-19 NOTE — Assessment & Plan Note (Signed)
Well controlled.  Continue current medications and low sodium Dash type diet.

## 2011-12-19 NOTE — Assessment & Plan Note (Signed)
AS murmur  F/U echo

## 2011-12-19 NOTE — Assessment & Plan Note (Signed)
Stable with no angina and good activity level.  Continue medical Rx

## 2011-12-19 NOTE — Assessment & Plan Note (Signed)
Cholesterol is at goal.  Continue current dose of statin and diet Rx.  No myalgias or side effects.  F/U  LFT's in 6 months. Lab Results  Component Value Date   LDLCALC 56 08/24/2011

## 2011-12-19 NOTE — Patient Instructions (Signed)
Your physician wants you to follow-up in:   [place] will receive a reminder letter in the mail two months in advance. If you don't receive a letter, please call our office to schedule the follow-up appointment. Your physician recommends that you continue on your current medications as directed. Please refer to the Current Medication list given to you today. Your physician has requested that you have an echocardiogram. Echocardiography is a painless test that uses sound waves to create images of your heart. It provides your doctor with information about the size and shape of your heart and how well your heart's chambers and valves are working. This procedure takes approximately one hour. There are no restrictions for this procedure. DX AS  MURMUR

## 2011-12-19 NOTE — Assessment & Plan Note (Signed)
Previous arch to innominate surgery on right and left CEA with occlusion and severe left sublavian disease.  F/U Early  Continue Plavix and ASA

## 2011-12-19 NOTE — Progress Notes (Signed)
Patient ID: Cristina Parker, female   DOB: Jan 16, 1936, 76 y.o.   MRN: [id number] 76 yo vasculopath. Previously seen by Dr Ron Parker. CAD with Clyde Hill to LAD in 2005. This was a combined procedure with Dr Amedeo Plenty who did an innominate bypass for severe arch disease. Previously had LCEA in 1999. Lost to F/U. Had duplex 8/12 at [facility] office that I reviewed. Systolic velocity over 4cm/sec and greater than 90% stenosis in RICA. No angina, mild exertional dyspnea. Ambulation limited by hip pain not claudication. Compliant with meds.   Saw Dr Donnetta Hutching 6/13  With new disease in left carotid:  Extensive supra-aortic peripheral vascular occlusive disease with a patent arch to innominate bypass. She does have a probable patency of her common carotid artery on the left with a markedly diminished flow with probable proximal stenosis. With her extensive left subclavian disease  she would not be a candidate for subclavian carotid bypass.  Repeat arch operation huge endeavor and Dr Early not inclined to proceed at this time  ROS: Denies fever, malais, weight loss, blurry vision, decreased visual acuity, cough, sputum, SOB, hemoptysis, pleuritic pain, palpitaitons, heartburn, abdominal pain, melena, lower extremity edema, claudication, or rash.  All other systems reviewed and negative  General: Affect appropriate Healthy:  appears stated age 12: normal Neck supple with no adenopathy JVP normal loud bypass bruits right neck and subclavian no thyromegaly Lungs clear with no wheezing and good diaphragmatic motion Heart:  S1/S2 AS murmur, no rub, gallop or click PMI normal Abdomen: benighn, BS positve, no tenderness, no AAA no bruit.  No HSM or HJR Distal pulses weak in both radials No edema Neuro non-focal Skin warm and dry No muscular weakness   Current Outpatient Prescriptions  Medication Sig Dispense Refill  . Ascorbic Acid (VITAMIN C) 1000 MG tablet Take 1,000 mg by mouth daily.        Marland Kitchen aspirin 81 MG tablet Take  81 mg by mouth daily.        Marland Kitchen atorvastatin (LIPITOR) 40 MG tablet Take 1 tablet (40 mg total) by mouth daily.  90 tablet  3  . Calcium Citrate-Vitamin D 250-200 MG-UNIT TABS Take 2 tablets by mouth daily.        . clopidogrel (PLAVIX) 75 MG tablet Take 1 tablet (75 mg total) by mouth daily.  90 tablet  3  . escitalopram (LEXAPRO) 20 MG tablet Take 1 tablet (20 mg total) by mouth daily.  90 tablet  3  . HYDROcodone-acetaminophen (VICODIN) 5-500 MG per tablet Take 1 tablet by mouth every 8 (eight) hours as needed. For pain.  90 tablet  3  . lisinopril-hydrochlorothiazide (PRINZIDE,ZESTORETIC) 10-12.5 MG per tablet Take 1 tablet by mouth daily.  90 tablet  3  . metoprolol succinate (TOPROL-XL) 100 MG 24 hr tablet Take 1 tablet (100 mg total) by mouth daily.  90 tablet  3  . pantoprazole (PROTONIX) 40 MG tablet Take 1 tablet (40 mg total) by mouth daily.  90 tablet  3  . Prenatal Vit-Fe Fumarate-FA (MULTIVITAMIN-PRENATAL) 27-0.8 MG TABS Take 1 tablet by mouth daily.        Allergies  Review of patient's allergies indicates no known allergies.  Electrocardiogram:  Assessment and Plan

## 2011-12-19 NOTE — Assessment & Plan Note (Signed)
NSR today and no palpitatons

## 2011-12-27 ENCOUNTER — Ambulatory Visit: Payer: Medicare Other | Admitting: Family Medicine

## 2011-12-28 ENCOUNTER — Other Ambulatory Visit (HOSPITAL_COMMUNITY): Payer: Medicare Other

## 2012-01-03 ENCOUNTER — Ambulatory Visit (INDEPENDENT_AMBULATORY_CARE_PROVIDER_SITE_OTHER): Payer: Medicare Other | Admitting: Family Medicine

## 2012-01-03 ENCOUNTER — Encounter: Payer: Self-pay | Admitting: Family Medicine

## 2012-01-03 VITALS — BP 106/71 | HR 66 | Temp 97.0°F | Resp 16 | Ht 58.25 in | Wt 115.2 lb

## 2012-01-03 DIAGNOSIS — I10 Essential (primary) hypertension: Secondary | ICD-10-CM

## 2012-01-03 DIAGNOSIS — Z760 Encounter for issue of repeat prescription: Secondary | ICD-10-CM

## 2012-01-03 MED ORDER — PRENATAL/IRON PO TABS: 1.0000 | ORAL_TABLET | Freq: Every day | ORAL | Status: DC

## 2012-01-03 MED ORDER — HYDROCODONE-ACETAMINOPHEN 5-500 MG PO TABS: 1.0000 | ORAL_TABLET | Freq: Three times a day (TID) | ORAL | Status: DC | PRN

## 2012-01-03 NOTE — Progress Notes (Signed)
S: Pt here today for brief follow-up, medication review and refills. No medication side effects.  She feels good and has no new complaints. She is currently caring for her 74 y.o. Sister who had to have eye surgery for detached retina. Pt is avoiding the extreme heat exposure and maintaining good hydration.   ROS: Negative for unexplained weight loss, fever, diaphoresis, CP, palpitations, chest tightness, SOB, cough, edema, dizziness, HA, numbness, syncope or weakness.  O: Vital signs reviewed   In NAD: WN, WD       HEENT: [place]/AT; EOMI, conj and sclerae clear       COR: RRR       LUNGS: Normal resp rate and effort       SKIN: Pale but otherwise normal       NEURO: Nonfocal   A/P:       HTN- stable and well controlled; continue current medications; pt has refills                 Encouraged good hydration              Medication RFs: Prenatal vitamin per pt request                                  Hydrocodone- APAP 5-500 mg- called to [id number] by Alfredia Ferguson, CMA        RTC in 6 months or prn acute problems; continue f/u with Dr. Donnetta Hutching

## 2012-01-08 ENCOUNTER — Ambulatory Visit (HOSPITAL_COMMUNITY): Payer: Medicare Other | Attending: Cardiovascular Disease | Admitting: Radiology

## 2012-01-08 DIAGNOSIS — R011 Cardiac murmur, unspecified: Secondary | ICD-10-CM | POA: Insufficient documentation

## 2012-01-08 DIAGNOSIS — I350 Nonrheumatic aortic (valve) stenosis: Secondary | ICD-10-CM

## 2012-01-08 DIAGNOSIS — E785 Hyperlipidemia, unspecified: Secondary | ICD-10-CM | POA: Insufficient documentation

## 2012-01-08 DIAGNOSIS — I10 Essential (primary) hypertension: Secondary | ICD-10-CM | POA: Insufficient documentation

## 2012-01-08 NOTE — Progress Notes (Signed)
Echocardiogram performed.  

## 2012-01-31 ENCOUNTER — Ambulatory Visit (INDEPENDENT_AMBULATORY_CARE_PROVIDER_SITE_OTHER): Payer: Medicare Other | Admitting: Emergency Medicine

## 2012-01-31 ENCOUNTER — Ambulatory Visit: Payer: Medicare Other

## 2012-01-31 VITALS — BP 104/72 | HR 93 | Temp 98.4°F | Resp 18 | Ht 58.38 in | Wt 113.8 lb

## 2012-01-31 DIAGNOSIS — R05 Cough: Secondary | ICD-10-CM

## 2012-01-31 DIAGNOSIS — J40 Bronchitis, not specified as acute or chronic: Secondary | ICD-10-CM

## 2012-01-31 DIAGNOSIS — J449 Chronic obstructive pulmonary disease, unspecified: Secondary | ICD-10-CM

## 2012-01-31 MED ORDER — LEVOFLOXACIN 500 MG PO TABS: 500.0000 mg | ORAL_TABLET | Freq: Every day | ORAL | Status: AC

## 2012-01-31 NOTE — Patient Instructions (Addendum)
1. Cough  DG Chest 2 View  2. COPD (chronic obstructive pulmonary disease)    3. Bronchitis  levofloxacin (LEVAQUIN) 500 MG tablet   Bronchitis Bronchitis is the body's way of reacting to injury and/or infection (inflammation) of the bronchi. Bronchi are the air tubes that extend from the windpipe into the lungs. If the inflammation becomes severe, it may cause shortness of breath. CAUSES  Inflammation may be caused by:  A virus.   Germs (bacteria).   Dust.   Allergens.   Pollutants and many other irritants.  The cells lining the bronchial tree are covered with tiny hairs (cilia). These constantly beat upward, away from the lungs, toward the mouth. This keeps the lungs free of pollutants. When these cells become too irritated and are unable to do their job, mucus begins to develop. This causes the characteristic cough of bronchitis. The cough clears the lungs when the cilia are unable to do their job. Without either of these protective mechanisms, the mucus would settle in the lungs. Then you would develop pneumonia. Smoking is a common cause of bronchitis and can contribute to pneumonia. Stopping this habit is the single most important thing you can do to help yourself. TREATMENT   Your caregiver may prescribe an antibiotic if the cough is caused by bacteria. Also, medicines that open up your airways make it easier to breathe. Your caregiver may also recommend or prescribe an expectorant. It will loosen the mucus to be coughed up. Only take over-the-counter or prescription medicines for pain, discomfort, or fever as directed by your caregiver.   Removing whatever causes the problem (smoking, for example) is critical to preventing the problem from getting worse.   Cough suppressants may be prescribed for relief of cough symptoms.   Inhaled medicines may be prescribed to help with symptoms now and to help prevent problems from returning.   For those with recurrent (chronic) bronchitis,  there may be a need for steroid medicines.  SEEK IMMEDIATE MEDICAL CARE IF:   During treatment, you develop more pus-like mucus (purulent sputum).   You have a fever.   Your baby is older than 3 months with a rectal temperature of 102 F (38.9 C) or higher.   Your baby is 55 months old or younger with a rectal temperature of 100.4 F (38 C) or higher.   You become progressively more ill.   You have increased difficulty breathing, wheezing, or shortness of breath.  It is necessary to seek immediate medical care if you are elderly or sick from any other disease. MAKE SURE YOU:   Understand these instructions.   Will watch your condition.   Will get help right away if you are not doing well or get worse.  Document Released: 06/04/2005 Document Revised: 05/24/2011 Document Reviewed: 04/13/2008 [facility] Patient Information 2012 [place].

## 2012-01-31 NOTE — Progress Notes (Signed)
Subjective:    Patient ID: Cristina Parker, female    DOB: 07-13-1935, 76 y.o.   MRN: [id number]  HPIThis 76 y.o. female presents for evaluation of cough and chest congestion.  Onset one week ago.  Onset with dry hacky cough; now coughing up sputum yellow and now green.  Yesterday, did not feel well.  Did not sleep well due to malaise.  No fever/chills/sweats.  No headache.  No ear pain or sore throat.  +rhinorrhea and +nasal congestion.  +cough; +DOE.  No wheezing.  No SOB at rest.  Able to ambulate around the home without difficulty.  No v/d.  Mucinex  DM yesterday.  No smoking; quit in 1998.  Smoked x years.  No daily inhalers.  Last fall, did receive inhaler but did not use.  History of pneumonia x 3 hospitalizations.  History of RUL lung nodule previously followed by [facility].    Review of Systems  Constitutional: Positive for activity change and fatigue. Negative for fever, chills and appetite change.  HENT: Positive for congestion, rhinorrhea and postnasal drip. Negative for ear pain and sneezing.   Eyes: Negative for pain and itching.  Respiratory: Positive for cough and shortness of breath. Negative for chest tightness and wheezing.   Cardiovascular: Negative for chest pain and leg swelling.  Gastrointestinal: Negative for vomiting and diarrhea.    Past Medical History  Diagnosis Date  . Heart disease   . Crohn's disease   . Duodenal stricture   . Esophageal reflux   . Hypertension   . Hyperlipemia   . Hiatal hernia   . Pneumonia   . Anemia   . Depression   . Collagenous colitis   . Anxiety   . Paroxysmal a-fib 07/09/2011  . Peripheral vascular disease     Past Surgical History  Procedure Date  . Coronary artery bypass graft   . Hip surgery     right  . Abdominal hysterectomy   . Cervical disc surgery   . Bunionectomy 1977    both feet   . Fracture surgery 1998    ORIF  . Eye surgery 10/2010    left eye cataract  . Carotid endarterectomy 05-1998    Left CEA   . Carotid endarterectomy 04/11/11    Right CEA  . Aorto- innominate bpg 2005    for innominate artery occlusive disease    Prior to Admission medications   Medication Sig Start Date End Date Taking? Authorizing Provider  Ascorbic Acid (VITAMIN C) 1000 MG tablet Take 1,000 mg by mouth daily.     Yes Historical Provider, MD  aspirin 81 MG tablet Take 81 mg by mouth daily.     Yes Historical Provider, MD  atorvastatin (LIPITOR) 40 MG tablet Take 1 tablet (40 mg total) by mouth daily. 08/24/11  Yes Barton Fanny, MD  Calcium Citrate-Vitamin D 250-200 MG-UNIT TABS Take 2 tablets by mouth daily.     Yes Historical Provider, MD  clopidogrel (PLAVIX) 75 MG tablet Take 1 tablet (75 mg total) by mouth daily. 08/24/11  Yes Barton Fanny, MD  escitalopram (LEXAPRO) 20 MG tablet Take 1 tablet (20 mg total) by mouth daily. 08/24/11  Yes Barton Fanny, MD  HYDROcodone-acetaminophen (VICODIN) 5-500 MG per tablet Take 1 tablet by mouth every 8 (eight) hours as needed. For pain. 01/03/12  Yes Barton Fanny, MD  lisinopril-hydrochlorothiazide (PRINZIDE,ZESTORETIC) 10-12.5 MG per tablet Take 1 tablet by mouth daily. 08/24/11  Yes Barton Fanny, MD  metoprolol succinate (TOPROL-XL) 100 MG 24 hr tablet Take 1 tablet (100 mg total) by mouth daily. 08/24/11  Yes Barton Fanny, MD  pantoprazole (PROTONIX) 40 MG tablet Take 1 tablet (40 mg total) by mouth daily. 08/24/11  Yes Barton Fanny, MD  Prenatal Multivit-Min-Fe-FA (PRENATAL/IRON) TABS Take 1 tablet by mouth daily. 01/03/12  Yes Barton Fanny, MD  levofloxacin (LEVAQUIN) 500 MG tablet Take 1 tablet (500 mg total) by mouth daily. 01/31/12 02/10/12  Wardell Honour, MD    No Known Allergies  History   Social History  . Marital Status: Widowed    Spouse Name: N/A    Number of Children: 2  . Years of Education: N/A   Occupational History  . retired     Therapist, sports  .     Social History Main Topics  . Smoking status: Former  Smoker    Types: Cigarettes    Quit date: 06/18/1996  . Smokeless tobacco: Never Used  . Alcohol Use: 3.5 - 7.0 oz/week    7-14 drink(s) per week  . Drug Use: No  . Sexually Active: Not on file   Other Topics Concern  . Not on file   Social History Narrative  . No narrative on file    Family History  Problem Relation Age of Onset  . Colon polyps Father   . Heart disease Father   . Stroke Father   . Colon cancer Neg Hx        Objective:   Physical Exam  Constitutional: She is oriented to person, place, and time. She appears well-developed and well-nourished. No distress.  HENT:  Head: Normocephalic.  Right Ear: External ear normal.  Left Ear: External ear normal.  Nose: Nose normal.  Mouth/Throat: Oropharynx is clear and moist. No oropharyngeal exudate.  Eyes: Conjunctivae and EOM are normal. Pupils are equal, round, and reactive to light. Left eye exhibits no discharge.  Neck: Normal range of motion. Neck supple.  Cardiovascular: Normal rate, regular rhythm, normal heart sounds and intact distal pulses.  Exam reveals no gallop and no friction rub.   No murmur heard. Pulmonary/Chest: Effort normal. No respiratory distress.       SCANT WHEEZING LLL. NO TACHYPNEA; NO RESP DISTRESS.  NO RALES.  Lymphadenopathy:    She has no cervical adenopathy.  Neurological: She is alert and oriented to person, place, and time. A cranial nerve deficit is present.  Skin: Skin is warm and dry. She is not diaphoretic.  Psychiatric: She has a normal mood and affect. Her behavior is normal. Judgment and thought content normal.         [facility] reading (PRIMARY) by  Dr. Tamala Julian. CXR: NAD  Assessment & Plan:   1. Cough  DG Chest 2 View  2. COPD (chronic obstructive pulmonary disease)    3. Bronchitis  levofloxacin (LEVAQUIN) 500 MG tablet    New.  Rx for Levaquin 565m daily x 10 days; continue Mucinex DM bid.  Borderline low pulse oximetry but unchanged from 05/2011 visit.  RTC  immediately for worsening dyspnea on exertion.  Supportive care with rest, fluids.

## 2012-02-04 NOTE — Progress Notes (Signed)
Reviewed and agree.

## 2012-02-11 ENCOUNTER — Telehealth: Payer: Self-pay | Admitting: Cardiovascular Disease

## 2012-02-11 NOTE — Telephone Encounter (Signed)
Patient would like to know if she supposed to take Metoprolol 50 mg or 100 mg once a day. Pt  States as far as she knows, she is to take and has been taken 100 mg 1/2 tablet ( 50 mg) daily. On 08/22/11 in  Dr. Leward Quan OV medication list, it is listed as taken 100 mg daily instead. I was unable to find when or why medication dose was changed. Pt would like for Dr. Johnsie Cancel to recommend what dose she needs to take.

## 2012-02-11 NOTE — Telephone Encounter (Signed)
Pt has medication questions

## 2012-02-12 ENCOUNTER — Ambulatory Visit: Payer: Medicare Other | Admitting: Family Medicine

## 2012-02-13 NOTE — Telephone Encounter (Signed)
Plz return call to patient, who is f/u on previous phone call.  She can be reached at [phone]

## 2012-02-13 NOTE — Telephone Encounter (Signed)
Patient called again today asking about the Metoprolol medication dose she supposed to be taken.( Please see note bellow)

## 2012-02-14 ENCOUNTER — Ambulatory Visit (INDEPENDENT_AMBULATORY_CARE_PROVIDER_SITE_OTHER): Payer: Medicare Other | Admitting: Family Medicine

## 2012-02-14 ENCOUNTER — Encounter: Payer: Self-pay | Admitting: Family Medicine

## 2012-02-14 VITALS — BP 98/76 | HR 68 | Temp 98.0°F | Resp 16 | Ht 59.5 in | Wt 112.8 lb

## 2012-02-14 DIAGNOSIS — F411 Generalized anxiety disorder: Secondary | ICD-10-CM

## 2012-02-14 DIAGNOSIS — T887XXA Unspecified adverse effect of drug or medicament, initial encounter: Secondary | ICD-10-CM

## 2012-02-14 DIAGNOSIS — I959 Hypotension, unspecified: Secondary | ICD-10-CM

## 2012-02-14 DIAGNOSIS — F419 Anxiety disorder, unspecified: Secondary | ICD-10-CM

## 2012-02-14 DIAGNOSIS — I10 Essential (primary) hypertension: Secondary | ICD-10-CM

## 2012-02-14 MED ORDER — LORAZEPAM 0.5 MG PO TABS: ORAL_TABLET | ORAL | Status: DC

## 2012-02-14 NOTE — Telephone Encounter (Signed)
PT HAS ONLY BEEN TAKING 1/2 TAB SINCE 2008  HAS NOT DONE ANY DIFFER  PT AWARE CONT  TO TAKE AS DIRECTED .Adonis Housekeeper

## 2012-02-14 NOTE — Patient Instructions (Addendum)
Anxiety and Panic Attacks Your caregiver has informed you that you are having an anxiety or panic attack. There may be many forms of this. Most of the time these attacks come suddenly and without warning. They come at any time of day, including periods of sleep, and at any time of life. They may be strong and unexplained. Although panic attacks are very scary, they are physically harmless. Sometimes the cause of your anxiety is not known. Anxiety is a protective mechanism of the body in its fight or flight mechanism. Most of these perceived danger situations are actually nonphysical situations (such as anxiety over losing a job). CAUSES  The causes of an anxiety or panic attack are many. Panic attacks may occur in otherwise healthy people given a certain set of circumstances. There may be a genetic cause for panic attacks. Some medications may also have anxiety as a side effect. SYMPTOMS  Some of the most common feelings are:  Intense terror.   Dizziness, feeling faint.   Hot and cold flashes.   Fear of going crazy.   Feelings that nothing is real.   Sweating.   Shaking.   Chest pain or a fast heartbeat (palpitations).   Smothering, choking sensations.   Feelings of impending doom and that death is near.   Tingling of extremities, this may be from over-breathing.   Altered reality (derealization).   Being detached from yourself (depersonalization).  Several symptoms can be present to make up anxiety or panic attacks. DIAGNOSIS  The evaluation by your caregiver will depend on the type of symptoms you are experiencing. The diagnosis of anxiety or panic attack is made when no physical illness can be determined to be a cause of the symptoms. TREATMENT  Treatment to prevent anxiety and panic attacks may include:  Avoidance of circumstances that cause anxiety.   Reassurance and relaxation.   Regular exercise.   Relaxation therapies, such as yoga.   Psychotherapy with a  psychiatrist or therapist.   Avoidance of caffeine, alcohol and illegal drugs.   Prescribed medication.  SEEK IMMEDIATE MEDICAL CARE IF:   You experience panic attack symptoms that are different than your usual symptoms.   You have any worsening or concerning symptoms.  Document Released: 06/04/2005 Document Revised: 05/24/2011 Document Reviewed: 10/06/2009 [facility] Patient Information 2012 [place].

## 2012-02-14 NOTE — Progress Notes (Signed)
S: This 76 y.o. Cauc female was treated for bronchitis starting 01/31/12; she was precribed Levaquin x 10 days. Had fatigue while taking this antibiotic. Maintained good appetite and hydration while on antibiotic. Had an episode of palpitations last Friday PM at bedtime. This resolved with 1/2 tab of Metoprolol. She was hospitalized in Jan 2013 and discharged on Metoprolol 50 mg daily. She saw Dr. Johnsie Cancel in July and notes report that pt is to continue all medications ( Metoprolol  24 hr tab 100 mg and Lisinopril HCT 10-12.5 gm 1 tab daily). She needs clarification on this and has called Dr. Kyla Balzarine office about this.       She thinks the episode that occurred Friday night was an anxiety attack and she requests some medication to use as needed. She has an adult son and grandson that live with her. She takes escitalopram (Lexapro) as prescribed.       ROS: as per HPI; pt denies HA, dizziness, N/V, diarrhea, syncope.  O:  Filed Vitals:   02/14/12 0936  BP: 98/76  Pulse: 68  Temp: 98 F (36.7 C)  Resp: 16   GEN: In NAD NECK: No TMG or LAN LUNGS: CTA, no wheezes or rales COR: RRR, no murmur, gallop LUNGS: CTA with decreased BS throughout NEURO: A&O x 3; CNs intact; no deficits noted.   ECHO (July 2013):  LVEF= 50-55%; otherwise essentially normal.    A/P:  1. Anxiety     RX: Lorazepam 0.5 mg  1/2 - 1 tablet bid prn anxiety. Continue Lexapro (generic)  2. Hypotension, unspecified   Pt will continue current doses of anti-hypertensives until she speaks with Cardiologist's office. Pt is currently asymptomatic.   3. HTN (hypertension) - as noted above.  4. Medication side effect - weakness that pt experienced while on Levaquin may have been antibiotic-related. She is feeling much better at this time.

## 2012-02-15 ENCOUNTER — Ambulatory Visit: Payer: Medicare Other | Admitting: Family Medicine

## 2012-02-28 ENCOUNTER — Ambulatory Visit (INDEPENDENT_AMBULATORY_CARE_PROVIDER_SITE_OTHER): Payer: Medicare Other | Admitting: Family Medicine

## 2012-02-28 DIAGNOSIS — Z23 Encounter for immunization: Secondary | ICD-10-CM

## 2012-05-23 ENCOUNTER — Telehealth: Payer: Self-pay

## 2012-05-23 NOTE — Telephone Encounter (Signed)
PT STATES [facility] SENT OVER REFILL REQUEST FOR VICODIN EARLIER THIS WEEK AND [facility] HAS NOT RECEIVED A RESPONSE   PLEASE CALL PATIENT WITH REFILL STATUS  [phone]

## 2012-05-24 MED ORDER — HYDROCODONE-ACETAMINOPHEN 5-500 MG PO TABS: 1.0000 | ORAL_TABLET | Freq: Three times a day (TID) | ORAL | Status: DC | PRN

## 2012-05-24 NOTE — Telephone Encounter (Signed)
PT CALLED AGAIN.  SAYS SHE HAS BEEN TRYING TO GET THIS MEDICINE FILLED SINCE Monday.  SHE HAS CALLED THE PHARMACY THREE TIMES AND THEY TELL HER WE ARE NOT RESPONDING.  SHE DOES NOT CARE AT THIS POINT WHO LOOKS AT HER MESSAGE.  SHE HAS OSTEOARTHRITIS AND WILL BE GOING OUT OF TOWN Monday.  PLEASE CALL ASAP [phone]

## 2012-05-24 NOTE — Telephone Encounter (Signed)
1) Please advise the patient that we have not received any refill requests from [facility] for her medication.   2) Please also advise her that it is our policy that the prescribing provider refill all controlled medications, however I do not want her to go without medication so I will authorize some until her physician is back in the office and can review her chart.

## 2012-05-26 ENCOUNTER — Other Ambulatory Visit: Payer: Self-pay | Admitting: Radiology

## 2012-05-26 NOTE — Telephone Encounter (Signed)
I have called her to advise, left message for her to call me back.

## 2012-05-26 NOTE — Telephone Encounter (Signed)
Patient called back, and was advised.

## 2012-05-26 NOTE — Telephone Encounter (Signed)
I called the hydrocodone to [facility] in error, she wants it to [facility]. I called [facility] and cancelled at the [facility].

## 2012-06-03 ENCOUNTER — Ambulatory Visit: Payer: Medicare Other | Admitting: Neurosurgery

## 2012-06-03 ENCOUNTER — Other Ambulatory Visit: Payer: Medicare Other

## 2012-06-19 ENCOUNTER — Encounter: Payer: Self-pay | Admitting: Family Medicine

## 2012-06-19 ENCOUNTER — Ambulatory Visit (INDEPENDENT_AMBULATORY_CARE_PROVIDER_SITE_OTHER): Payer: Medicare Other | Admitting: Family Medicine

## 2012-06-19 VITALS — BP 92/80 | HR 66 | Temp 97.7°F | Resp 16 | Ht <= 58 in | Wt 114.0 lb

## 2012-06-19 DIAGNOSIS — Z760 Encounter for issue of repeat prescription: Secondary | ICD-10-CM

## 2012-06-19 DIAGNOSIS — Z862 Personal history of diseases of the blood and blood-forming organs and certain disorders involving the immune mechanism: Secondary | ICD-10-CM

## 2012-06-19 DIAGNOSIS — I10 Essential (primary) hypertension: Secondary | ICD-10-CM

## 2012-06-19 LAB — CBC WITH DIFFERENTIAL/PLATELET
Eosinophils Absolute: 0.4 10*3/uL (ref 0.0–0.7)
Eosinophils Relative: 5 % (ref 0–5)
HCT: 34.2 % — ABNORMAL LOW (ref 36.0–46.0)
Hemoglobin: 11.7 g/dL — ABNORMAL LOW (ref 12.0–15.0)
Lymphocytes Relative: 17 % (ref 12–46)
Lymphs Abs: 1.3 10*3/uL (ref 0.7–4.0)
MCH: 32.8 pg (ref 26.0–34.0)
MCV: 95.8 fL (ref 78.0–100.0)
Monocytes Absolute: 0.5 10*3/uL (ref 0.1–1.0)
Monocytes Relative: 7 % (ref 3–12)
Platelets: 230 10*3/uL (ref 150–400)
RBC: 3.57 MIL/uL — ABNORMAL LOW (ref 3.87–5.11)
WBC: 7.7 10*3/uL (ref 4.0–10.5)

## 2012-06-19 LAB — COMPREHENSIVE METABOLIC PANEL
Alkaline Phosphatase: 102 U/L (ref 39–117)
Creat: 1.17 mg/dL — ABNORMAL HIGH (ref 0.50–1.10)
Glucose, Bld: 82 mg/dL (ref 70–99)
Sodium: 137 mEq/L (ref 135–145)
Total Bilirubin: 0.3 mg/dL (ref 0.3–1.2)
Total Protein: 6.2 g/dL (ref 6.0–8.3)

## 2012-06-19 MED ORDER — LISINOPRIL-HYDROCHLOROTHIAZIDE 10-12.5 MG PO TABS: 1.0000 | ORAL_TABLET | Freq: Every day | ORAL | Status: DC

## 2012-06-19 MED ORDER — PANTOPRAZOLE SODIUM 40 MG PO TBEC: 40.0000 mg | DELAYED_RELEASE_TABLET | Freq: Every day | ORAL | Status: DC

## 2012-06-19 MED ORDER — HYDROCODONE-ACETAMINOPHEN 5-500 MG PO TABS: 1.0000 | ORAL_TABLET | Freq: Three times a day (TID) | ORAL | Status: DC | PRN

## 2012-06-19 MED ORDER — LORAZEPAM 0.5 MG PO TABS: ORAL_TABLET | ORAL | Status: DC

## 2012-06-19 MED ORDER — ESCITALOPRAM OXALATE 20 MG PO TABS: 20.0000 mg | ORAL_TABLET | Freq: Every day | ORAL | Status: DC

## 2012-06-19 MED ORDER — METOPROLOL SUCCINATE ER 100 MG PO TB24: ORAL_TABLET | ORAL | Status: DC

## 2012-06-19 MED ORDER — ATORVASTATIN CALCIUM 40 MG PO TABS: 40.0000 mg | ORAL_TABLET | Freq: Every day | ORAL | Status: DC

## 2012-06-19 NOTE — Patient Instructions (Addendum)
I have refilled all your chronic medications for 1 year except pain medication and Lorazepam. I did not refill your Plavix; the Cardiologist can decide if you will continue on this medication when you see that specialist.

## 2012-06-19 NOTE — Progress Notes (Signed)
S: This 77 y.o. Cauc female is here for HTN f/u and medication refills. She continues to have low BP but is asymptomatic and denies any adverse effects w/ current medications. Metoprolol dose is only 1/2 tablet daily; she has reviewed this dose w/ her Cardiologist. Pt reports attempts to schedule future appt with that specialist. She maintains good hydration and good appetitie. Pain is well controlled on current medications.  ROS: Negative for fatigue, abnormal weight change, CP or tightness, palpitations, edema, GI/GU problems, HA, lightheadedness, dizziness, weakness, numbness or syncope.  O:  Filed Vitals:   06/19/12 0851  BP: 92/80  Pulse: 66  Temp: 97.7 F (36.5 C)  Resp: 16   GEN: In NAD; WN,WD. HENT: [place]/AT; EOMI w/ clear conj/sclerae. Otherwise unremarkable. COR: RRR. No edema. LUNGS: Normal resp rate and effort. NEURO: A&O x 3; CNs intact. Nonfocal.  A/P:  1. HTN (hypertension) - stable on current medications Comprehensive metabolic panel Follow-up w/ Cardiologist as planned; may need medication adjustment to prevent symptomatic hypotension  2. History of anemia  CBC with Differential Continue Prenatal vitamin  3. Medication refill  RFs on all chronic medications except Plavix; Cardiologist to determine if pt to continue this medication long term (most likely this is a chronic med)

## 2012-06-20 ENCOUNTER — Encounter: Payer: Self-pay | Admitting: Radiology

## 2012-06-20 NOTE — Progress Notes (Signed)
Quick Note:  Please call pt and advise that the following labs are abnormal... Kidney function test shows a slight decline in function. Maintain adequate hydration and considering reducing amount of protein in your diet. Otherwise, continue current medications. Blood counts have not changed a lot since Oct 2013. Continue Prenatal vitamin daily.   Copy to pt.  ______

## 2012-06-23 ENCOUNTER — Ambulatory Visit: Payer: Medicare Other | Admitting: Cardiovascular Disease

## 2012-06-30 ENCOUNTER — Encounter: Payer: Self-pay | Admitting: Neurosurgery

## 2012-07-01 ENCOUNTER — Other Ambulatory Visit (INDEPENDENT_AMBULATORY_CARE_PROVIDER_SITE_OTHER): Payer: Medicare Other | Admitting: *Deleted

## 2012-07-01 ENCOUNTER — Ambulatory Visit (INDEPENDENT_AMBULATORY_CARE_PROVIDER_SITE_OTHER): Payer: Medicare Other | Admitting: Neurosurgery

## 2012-07-01 ENCOUNTER — Encounter: Payer: Self-pay | Admitting: Neurosurgery

## 2012-07-01 VITALS — BP 85/60 | HR 68 | Resp 14 | Ht 59.0 in | Wt 115.0 lb

## 2012-07-01 DIAGNOSIS — Z48812 Encounter for surgical aftercare following surgery on the circulatory system: Secondary | ICD-10-CM

## 2012-07-01 DIAGNOSIS — I6529 Occlusion and stenosis of unspecified carotid artery: Secondary | ICD-10-CM

## 2012-07-01 NOTE — Progress Notes (Signed)
[facility] [place] Carotid Office Note  CC: Carotid surveillance Referring Physician: Early  History of Present Illness: 77 year old female patient of Dr. Donnetta Hutching is who underwent aortic innominate graft in January 2005, left CEA in 1999 and a right CEA in October 2012. The patient denies any signs or symptoms of CVA, TIA, amaurosis fugax or any neural deficit. The patient has multiple medical issues but no new medical diagnoses.  Past Medical History  Diagnosis Date  . Heart disease   . Crohn's disease   . Duodenal stricture   . Esophageal reflux   . Hypertension   . Hyperlipemia   . Hiatal hernia   . Pneumonia   . Anemia   . Depression   . Collagenous colitis   . Anxiety   . Paroxysmal a-fib 07/09/2011  . Peripheral vascular disease     ROS: _0  Positive   _1  Denies    General: _2  Weight loss, _3  Fever, _4  chills Neurologic: _5  Dizziness, _6  Blackouts, _7  Seizure _8  Stroke, _9  "Mini stroke", _10  Slurred speech, _11  Temporary blindness; _12  weakness in arms or legs, _13  Hoarseness Cardiac: _14  Chest pain/pressure, _15  Shortness of breath at rest _16  Shortness of breath with exertion, _17  Atrial fibrillation or irregular heartbeat Vascular: _18  Pain in legs with walking, _19  Pain in legs at rest, _20  Pain in legs at night,  _21  Non-healing ulcer, _22  Blood clot in vein/DVT,   Pulmonary: _23  Home oxygen, _24  Productive cough, _25  Coughing up blood, _26  Asthma,  _27  Wheezing Musculoskeletal:  _28  Arthritis, _29  Low back pain, _30  Joint pain Hematologic: _31  Easy Bruising, _32  Anemia; _33  Hepatitis Gastrointestinal: _34  Blood in stool, _35  Gastroesophageal Reflux/heartburn, _36  Trouble swallowing Urinary: _37  chronic Kidney disease, _38  on HD - _39  MWF or _40  TTHS, _41  Burning with urination, _42  Difficulty urinating Skin: _43  Rashes, _44  Wounds Psychological: _45  Anxiety, _46  Depression   Social History History  Substance Use Topics  . Smoking  status: Former Smoker    Types: Cigarettes    Quit date: 06/18/1996  . Smokeless tobacco: Never Used  . Alcohol Use: 3.5 - 7.0 oz/week    7-14 drink(s) per week    Family History Family History  Problem Relation Age of Onset  . Colon polyps Father   . Heart disease Father   . Stroke Father   . Colon cancer Neg Hx     No Known Allergies  Current Outpatient Prescriptions  Medication Sig Dispense Refill  . Ascorbic Acid (VITAMIN C) 1000 MG tablet Take 1,000 mg by mouth daily.        Marland Kitchen aspirin 81 MG tablet Take 81 mg by mouth daily.        Marland Kitchen atorvastatin (LIPITOR) 40 MG tablet Take 1 tablet (40 mg total) by mouth daily.  90 tablet  3  . Calcium Citrate-Vitamin D 250-200 MG-UNIT TABS Take 2 tablets by mouth daily.        . clopidogrel (PLAVIX) 75 MG tablet Take 1 tablet (75 mg total) by mouth daily.  90 tablet  3  . escitalopram (LEXAPRO) 20 MG tablet Take 1 tablet (20 mg total) by mouth daily.  90 tablet  3  . HYDROcodone-acetaminophen (VICODIN) 5-500 MG per tablet Take 1  tablet by mouth every 8 (eight) hours as needed. For pain.  90 tablet  2  . lisinopril-hydrochlorothiazide (PRINZIDE,ZESTORETIC) 10-12.5 MG per tablet Take 1 tablet by mouth daily.  90 tablet  3  . LORazepam (ATIVAN) 0.5 MG tablet Take 1/2 to 1 tablet twice a day prn anxiety.  90 tablet  1  . metoprolol succinate (TOPROL-XL) 100 MG 24 hr tablet Take 1/2 tablet daily  90 tablet  1  . pantoprazole (PROTONIX) 40 MG tablet Take 1 tablet (40 mg total) by mouth daily.  90 tablet  3  . Prenatal Multivit-Min-Fe-FA (PRENATAL/IRON) TABS Take 1 tablet by mouth daily.  90 tablet  3  . prenatal vitamin w/FE, FA (PRENATAL 1 + 1) 27-1 MG TABS daily.        Physical Examination  Filed Vitals:   07/01/12 1116  BP: 85/60  Pulse: 68  Resp:     Body mass index is 23.23 kg/(m^2).  General:  WDWN in NAD Gait: Normal HEENT: WNL Eyes: Pupils equal Pulmonary: normal non-labored breathing , without Rales, rhonchi,   wheezing Cardiac: RRR, without  Murmurs, rubs or gallops; Abdomen: soft, NT, no masses Skin: no rashes, ulcers noted  Vascular Exam Pulses: 3+ radial pulses Carotid bruits: Carotid pulse on the right with a dampened flow left Extremities without ischemic changes, no Gangrene , no cellulitis; no open wounds;  Musculoskeletal: no muscle wasting or atrophy   Neurologic: A&O X 3; Appropriate Affect ; SENSATION: normal; MOTOR FUNCTION:  moving all extremities equally. Speech is fluent/normal  Non-Invasive Vascular Imaging CAROTID DUPLEX 07/01/2012  Right ICA 0 - 19% stenosis Left ICA 40 - 59 % stenosis   ASSESSMENT/PLAN: Asymptomatic patient with a history of left CCA occlusion with reconstitution of the mid and distal segment to retrograde flow this is unchanged from previous exam. The patient will followup in 6 months per Dr. early with repeat carotid duplex. Her questions were encouraged and answered, she is in agreement with this plan.  Beatris Ship ANP   Clinic MD: Early

## 2012-07-01 NOTE — Addendum Note (Signed)
Addended by: Thresa Ross C on: 07/01/2012 12:36 PM   Modules accepted: Orders

## 2012-07-03 ENCOUNTER — Ambulatory Visit (INDEPENDENT_AMBULATORY_CARE_PROVIDER_SITE_OTHER): Payer: Medicare Other | Admitting: Cardiovascular Disease

## 2012-07-03 ENCOUNTER — Encounter: Payer: Self-pay | Admitting: Cardiovascular Disease

## 2012-07-03 VITALS — BP 113/76 | HR 73 | Wt 113.0 lb

## 2012-07-03 DIAGNOSIS — J449 Chronic obstructive pulmonary disease, unspecified: Secondary | ICD-10-CM

## 2012-07-03 DIAGNOSIS — R011 Cardiac murmur, unspecified: Secondary | ICD-10-CM

## 2012-07-03 NOTE — Assessment & Plan Note (Signed)
F/U Dr Donnetta Hutching I doubt she will be a candidate for any other surgery.  F/U duplex at [facility] Continue ASA and Plavix

## 2012-07-03 NOTE — Assessment & Plan Note (Signed)
Well controlled.  Continue current medications and low sodium Dash type diet.

## 2012-07-03 NOTE — Patient Instructions (Addendum)
**Note De-identified  Obfuscation** Your physician recommends that you continue on your current medications as directed. Please refer to the Current Medication list given to you today.  Your physician wants you to follow-up in: 1 year. You will receive a reminder letter in the mail two months in advance. If you don't receive a letter, please call our office to schedule the follow-up appointment.  

## 2012-07-03 NOTE — Progress Notes (Signed)
Patient ID: Cristina Parker, female   DOB: 1936/02/17, 77 y.o.   MRN: [id number]  77 yo vasculopath. Previously seen by Dr Ron Parker. CAD with Goodrich to LAD in 2005. This was a combined procedure with Dr Amedeo Plenty who did an innominate bypass for severe arch disease. Previously had LCEA in 1999. Lost to F/U. Had duplex 8/12 at [facility] office that I reviewed. Systolic velocity over 4cm/sec and greater than 90% stenosis in RICA. No angina, mild exertional dyspnea. Ambulation limited by hip pain not claudication. Compliant with meds.  Saw Dr Donnetta Hutching 6/13 With new disease in left carotid: Extensive supra-aortic peripheral vascular occlusive disease with a patent arch to innominate bypass. She does have a probable patency of her common carotid artery on the left with a markedly diminished flow with probable proximal stenosis. With her extensive left subclavian disease she would not be a candidate for subclavian carotid bypass. Repeat arch operation huge endeavor and Dr Donnetta Hutching not inclined to proceed at this time  CXR 8/13 stable COPD no flu or URI this winter yet  ROS: Denies fever, malais, weight loss, blurry vision, decreased visual acuity, cough, sputum, SOB, hemoptysis, pleuritic pain, palpitaitons, heartburn, abdominal pain, melena, lower extremity edema, claudication, or rash.  All other systems reviewed and negative  General: Affect appropriate Chronically ill elderly female HEENT: normal Neck supple with no adenopathy JVP normal bilateral bruits no thyromegaly Lungs clear with no wheezing and good diaphragmatic motion Heart:  S1/S2 no murmur, no rub, gallop or click PMI normal Abdomen: benighn, BS positve, no tenderness, no AAA no bruit.  No HSM or HJR Distal pulses intact with no bruits No edema Neuro non-focal Skin warm and dry No muscular weakness   Current Outpatient Prescriptions  Medication Sig Dispense Refill  . Ascorbic Acid (VITAMIN C) 1000 MG tablet Take 1,000 mg by mouth daily.        Marland Kitchen  aspirin 81 MG tablet Take 81 mg by mouth daily.        Marland Kitchen atorvastatin (LIPITOR) 40 MG tablet Take 1 tablet (40 mg total) by mouth daily.  90 tablet  3  . Calcium Citrate-Vitamin D 250-200 MG-UNIT TABS Take 2 tablets by mouth daily.        . clopidogrel (PLAVIX) 75 MG tablet Take 1 tablet (75 mg total) by mouth daily.  90 tablet  3  . escitalopram (LEXAPRO) 20 MG tablet Take 1 tablet (20 mg total) by mouth daily.  90 tablet  3  . HYDROcodone-acetaminophen (VICODIN) 5-500 MG per tablet Take 1 tablet by mouth every 8 (eight) hours as needed. For pain.  90 tablet  2  . lisinopril-hydrochlorothiazide (PRINZIDE,ZESTORETIC) 10-12.5 MG per tablet Take 1 tablet by mouth daily.  90 tablet  3  . LORazepam (ATIVAN) 0.5 MG tablet Take 1/2 to 1 tablet twice a day prn anxiety.  90 tablet  1  . metoprolol succinate (TOPROL-XL) 100 MG 24 hr tablet Take 1/2 tablet daily  90 tablet  1  . pantoprazole (PROTONIX) 40 MG tablet Take 1 tablet (40 mg total) by mouth daily.  90 tablet  3  . Prenatal Multivit-Min-Fe-FA (PRENATAL/IRON) TABS Take 1 tablet by mouth daily.  90 tablet  3    Allergies  Review of patient's allergies indicates no known allergies.  Electrocardiogram:  SR rate 73 normal incorrectly read by computer as accelerated junctional   Assessment and Plan

## 2012-07-03 NOTE — Assessment & Plan Note (Signed)
Has had flu shot  No active wheezing f/u primary

## 2012-07-03 NOTE — Assessment & Plan Note (Signed)
Maint NSR no coumadin on ASA and Plavix

## 2012-07-24 ENCOUNTER — Other Ambulatory Visit: Payer: Self-pay | Admitting: Family Medicine

## 2012-07-24 ENCOUNTER — Telehealth: Payer: Self-pay

## 2012-07-24 NOTE — Telephone Encounter (Signed)
Dr. Leward Quan: to please respond [facility] fax regarding her Vicodin. According to patient they are putting less Tylenol in the Vicodin now and the prescription needs to be changed but they haven't received our response yet. Her number is [phone]

## 2012-07-24 NOTE — Telephone Encounter (Signed)
Patient has 2 remaining Rx on her Hydrocodone 5/500, however this strength is no longer being manufactured. I have called [facility] and advised okay to change to the 5/325 strength. Called patient to advise this is done. FYI Cristina Parker

## 2012-09-15 ENCOUNTER — Telehealth: Payer: Self-pay | Admitting: Gastroenterology

## 2012-09-15 ENCOUNTER — Encounter: Payer: Self-pay | Admitting: Gastroenterology

## 2012-09-15 NOTE — Telephone Encounter (Signed)
Pt last seen in 2012 for Collagenous Colitis by Dr Sharlett Iles found on 10/04/10 COLON. Last EGD was 06/29/09 with dilation of Pyloric and Duodenal Strictures. Pt reports abdominal cramping after she eats for the past week and yesterday, she threw up all night. She's had strictures in the past; doesn't know if this is the problem this time or not. Pt given an appt with Alonza Bogus, PA. Tomorrow.

## 2012-09-16 ENCOUNTER — Encounter: Payer: Self-pay | Admitting: Gastroenterology

## 2012-09-16 ENCOUNTER — Ambulatory Visit (INDEPENDENT_AMBULATORY_CARE_PROVIDER_SITE_OTHER): Payer: Medicare Other | Admitting: Gastroenterology

## 2012-09-16 ENCOUNTER — Telehealth: Payer: Self-pay | Admitting: *Deleted

## 2012-09-16 VITALS — BP 110/68 | HR 60 | Ht 59.0 in | Wt 111.0 lb

## 2012-09-16 DIAGNOSIS — R1013 Epigastric pain: Secondary | ICD-10-CM | POA: Insufficient documentation

## 2012-09-16 DIAGNOSIS — K315 Obstruction of duodenum: Secondary | ICD-10-CM | POA: Insufficient documentation

## 2012-09-16 NOTE — Telephone Encounter (Signed)
Ok to hold plavix for endoscopic procedure

## 2012-09-16 NOTE — Telephone Encounter (Signed)
09/16/2012 RE: SKILER TYE DOB: 09/05/1935 MRN: [id number] Dear Dr Johnsie Cancel,   We have scheduled the above patient for an endoscopic procedure. Our records show that she is on anticoagulation therapy.   Please advise as to whether the patient may come off her therapy of Plavix 5 days prior to the procedure, which is scheduled for 10/01/12.  Please route back to Dixon Boos, [place].   Sincerely,  Dixon Boos

## 2012-09-16 NOTE — Progress Notes (Signed)
09/16/2012 Cristina Parker [id number] Jan 09, 1936   History of Present Illness: Patient is a pleasant 77 year old female who is a patient of Dr. Buel Ream.  She has a history of duodenal stricture with outlet obstruction that has required dilation on three occasions, last in 06/2009.  She comes in to the office today complaining of upper abdominal pain after every time that she eats for the past 10 days.  Says that it is just uncomfortable, not excruciating pain, but she wanted to catch it early before it gets to bad.  Had several episodes of vomiting on Sunday, however, that as resolved.  She does not have any pain or discomfort as long as she does not eat.  Says that she has diarrhea on and off, for which she uses Imodium prn successfully.  The diarrhea that she experiences is not nearly as bad as when she was treated for her collagenous colitis in the past.  Current Medications, Allergies, Past Medical History, Past Surgical History, Family History and Social History were reviewed in [employer] record.   Physical Exam: BP 110/68  Pulse 60  Ht 4' 11" (1.499 m)  Wt 111 lb (50.349 kg)  BMI 22.41 kg/m2 General: Well developed, white female in no acute distress Head: Normocephalic and atraumatic Eyes:  sclerae anicteric, conjunctiva pink  Ears: Normal auditory acuity Lungs: Clear throughout to auscultation Heart: Regular rate and rhythm; 3/6 SEM noted Abdomen: Soft, non-tender and non-distended. No masses, no hepatomegaly. Normal bowel sounds. Musculoskeletal: Symmetrical with no gross deformities  Extremities: No edema  Neurological: Alert oriented x 4, grossly nonfocal Psychological:  Alert and cooperative. Normal mood and affect  Assessment and Recommendations: -History of duodenal strictures s/p dilation in the past.  Now with 10 days of post-prandial upper abdominal pain.  One day of nausea/vomiting, but that has resolved.  *Will schedule for repeat EGD with  possible dilation.  Will contact cards for clearance to stop plav

## 2012-09-16 NOTE — Telephone Encounter (Signed)
Patient advised that per Dr Johnsie Cancel, she may hold her plavix 5 days prior to her procedure. She verbalizes understanding and has been advised to call back with any questions.

## 2012-09-16 NOTE — Patient Instructions (Addendum)
You have been scheduled for an endoscopy with propofol. Please follow written instructions given to you at your visit today. If you use inhalers (even only as needed), please bring them with you on the day of your procedure.  CC: Dr Leward Quan

## 2012-09-25 ENCOUNTER — Encounter: Payer: Self-pay | Admitting: Family Medicine

## 2012-09-25 ENCOUNTER — Ambulatory Visit (INDEPENDENT_AMBULATORY_CARE_PROVIDER_SITE_OTHER): Payer: Medicare Other | Admitting: Family Medicine

## 2012-09-25 VITALS — BP 93/61 | HR 63 | Temp 97.8°F | Resp 16 | Ht 58.5 in | Wt 111.0 lb

## 2012-09-25 DIAGNOSIS — F32A Depression, unspecified: Secondary | ICD-10-CM | POA: Insufficient documentation

## 2012-09-25 DIAGNOSIS — M810 Age-related osteoporosis without current pathological fracture: Secondary | ICD-10-CM | POA: Insufficient documentation

## 2012-09-25 DIAGNOSIS — I10 Essential (primary) hypertension: Secondary | ICD-10-CM

## 2012-09-25 DIAGNOSIS — R799 Abnormal finding of blood chemistry, unspecified: Secondary | ICD-10-CM

## 2012-09-25 DIAGNOSIS — Z0000 Encounter for general adult medical examination without abnormal findings: Secondary | ICD-10-CM

## 2012-09-25 DIAGNOSIS — M129 Arthropathy, unspecified: Secondary | ICD-10-CM

## 2012-09-25 DIAGNOSIS — F329 Major depressive disorder, single episode, unspecified: Secondary | ICD-10-CM

## 2012-09-25 DIAGNOSIS — Z760 Encounter for issue of repeat prescription: Secondary | ICD-10-CM

## 2012-09-25 DIAGNOSIS — M1990 Unspecified osteoarthritis, unspecified site: Secondary | ICD-10-CM | POA: Insufficient documentation

## 2012-09-25 DIAGNOSIS — H269 Unspecified cataract: Secondary | ICD-10-CM | POA: Insufficient documentation

## 2012-09-25 DIAGNOSIS — R7989 Other specified abnormal findings of blood chemistry: Secondary | ICD-10-CM

## 2012-09-25 LAB — LIPID PANEL
Cholesterol: 134 mg/dL (ref 0–200)
HDL: 56 mg/dL (ref >39)
Total CHOL/HDL Ratio: 2.4 Ratio
VLDL: 18 mg/dL (ref 0–40)

## 2012-09-25 LAB — BASIC METABOLIC PANEL
CO2: 27 mEq/L (ref 19–32)
Chloride: 100 mEq/L (ref 96–112)
Creat: 0.92 mg/dL (ref 0.50–1.10)
Potassium: 4.6 mEq/L (ref 3.5–5.3)

## 2012-09-25 LAB — POCT URINALYSIS DIPSTICK
Bilirubin, UA: NEGATIVE
Blood, UA: NEGATIVE
Glucose, UA: NEGATIVE
Ketones, UA: NEGATIVE
pH, UA: 7

## 2012-09-25 MED ORDER — METOPROLOL SUCCINATE ER 100 MG PO TB24: ORAL_TABLET | ORAL | Status: DC

## 2012-09-25 MED ORDER — HYDROCODONE-ACETAMINOPHEN 10-325 MG PO TABS: ORAL_TABLET | ORAL | Status: DC

## 2012-09-25 MED ORDER — ESCITALOPRAM OXALATE 20 MG PO TABS: 20.0000 mg | ORAL_TABLET | Freq: Every day | ORAL | Status: DC

## 2012-09-25 MED ORDER — LORAZEPAM 0.5 MG PO TABS: ORAL_TABLET | ORAL | Status: DC

## 2012-09-25 MED ORDER — CLOPIDOGREL BISULFATE 75 MG PO TABS: 75.0000 mg | ORAL_TABLET | Freq: Every day | ORAL | Status: DC

## 2012-09-25 NOTE — Patient Instructions (Addendum)
Blood pressure- your pressure reading today is low; maintain good hydration with water and other liquids. You may add a little salt to some foods (if Dr. Johnsie Cancel has not advised you to avoid salt altogether). If you start having dizziness or lightheadedness on a regular basis, contact Dr. Johnsie Cancel or myself to have this evaluated.

## 2012-09-28 ENCOUNTER — Encounter: Payer: Self-pay | Admitting: Family Medicine

## 2012-09-28 NOTE — Progress Notes (Signed)
Quick Note:  Please contact pt and advise the following about her labs...  All tests results are normal except blood sugar was low at the time blood was drawn. Try not to go more than 3-4 hours between consuming some food or snack.  Kidney tests and thyroid function are all normal. Lipid/ cholesterol numbers look great!  Copy to pt. ______

## 2012-09-28 NOTE — Progress Notes (Signed)
Subjective:    Patient ID: Cristina Parker, female    DOB: Jun 15, 1936, 77 y.o.   MRN: [id number]  HPI   This 77 y.o. Cauc emale is here for annual MCR CPE. Main medical issues include HTN w/  CAD and Ventricular arrhythmia and GI problems (GERD/Hiatal hernia, etc). Pt is compliant w/  medications; she c/o dizziness and lightheadedness. Thinking this may be related to BP meds,  she skipped her medication one day and felt much better. Pt  also mentions that occlusion of  carotid artery is being monitored by Specialist.   PAP: NA (s/p TAH).  MMG: 2013 (normal).  ECG: Per Cardiology.  DEXA: 6-7 years ago (per pt); abnormal.    Review of Systems  Neurological: Positive for dizziness and light-headedness.  All other systems reviewed and are negative.       Objective:   Physical Exam  Nursing note and vitals reviewed. Constitutional: She is oriented to person, place, and time. Vital signs are normal. She appears well-developed and well-nourished. No distress.  HENT:  Head: Normocephalic and atraumatic.  Right Ear: Hearing, external ear and ear canal normal. Tympanic membrane is scarred. Tympanic membrane is not erythematous and not retracted.  Left Ear: Hearing, external ear and ear canal normal. Tympanic membrane is scarred. Tympanic membrane is not injected, not erythematous and not retracted.  Nose: Nose normal. No nasal deformity or septal deviation.  Mouth/Throat: Uvula is midline and mucous membranes are normal. No oral lesions. Normal dentition. Posterior oropharyngeal erythema present. No oropharyngeal exudate or posterior oropharyngeal edema.  Eyes: Conjunctivae, EOM and lids are normal. Pupils are equal, round, and reactive to light. No scleral icterus.  Vision care per professional.  Neck: No JVD present. No spinous process tenderness and no muscular tenderness present. Carotid bruit is not present. Decreased range of motion present. No mass and no thyromegaly present.   Cardiovascular: Normal rate, regular rhythm and intact distal pulses.  Exam reveals no gallop and no friction rub.   Murmur heard. Pulmonary/Chest: Effort normal and breath sounds normal. No respiratory distress. She has no wheezes. She has no rales.  Abdominal: Soft. Normal appearance. She exhibits no distension, no abdominal bruit, no pulsatile midline mass and no mass. Bowel sounds are decreased. There is no hepatosplenomegaly. There is no tenderness. There is no guarding and no CVA tenderness. No hernia.  Genitourinary:  NEFG; no internal exam performed.  Musculoskeletal: She exhibits no edema and no tenderness.  Deg changes/ Heberdens's nodes and Decreased ROM in wrists, hands/digits, knees and feet.  Neurological: She is alert and oriented to person, place, and time. She has normal reflexes. No cranial nerve deficit. She exhibits normal muscle tone. Coordination normal.  Skin: Skin is warm and dry. No rash noted. No erythema.  Psychiatric: She has a normal mood and affect. Her behavior is normal. Judgment and thought content normal.    BECK Depression Scale: score= 13 (mild mood disturbance); pt takes Escitalopram daily and uses anxiolytic prn.      Assessment & Plan:  Routine general medical examination at a health care facility - Plan: POCT urinalysis dipstick, Lipid panel  HTN (hypertension) -  Low BP reading today. No medication change unless ordered by Cardiologist; maintain adequate hydration. Plan: TSH, T4, Free  Elevated serum creatinine - Plan: Basic metabolic panel  Depression - Stable; continue current medications. Plan: TSH  Arthritis- continue current treatment; remain as active as possible.  Issue of repeat prescriptions  Meds ordered this encounter  Medications  .  clopidogrel (PLAVIX) 75 MG tablet    Sig: Take 1 tablet (75 mg total) by mouth daily.    Dispense:  90 tablet    Refill:  3  . escitalopram (LEXAPRO) 20 MG tablet    Sig: Take 1 tablet (20 mg  total) by mouth daily.    Dispense:  90 tablet    Refill:  3  . LORazepam (ATIVAN) 0.5 MG tablet    Sig: Take 1/2 to 1 tablet twice a day prn anxiety.    Dispense:  90 tablet    Refill:  1  . metoprolol succinate (TOPROL-XL) 100 MG 24 hr tablet    Sig: Take 1/2 tablet daily    Dispense:  90 tablet    Refill:  1  . DISCONTD: HYDROcodone-acetaminophen (NORCO) 10-325 MG per tablet    Sig: Take 1/2 tablet every 8-12 hours prn moderately severe pain.    Dispense:  50 tablet    Refill:  0  . HYDROcodone-acetaminophen (NORCO) 10-325 MG per tablet    Sig: Take 1/2 tablet every 8-12 hours prn moderately severe pain.    Dispense:  100 tablet    Refill:  0

## 2012-10-01 ENCOUNTER — Ambulatory Visit (AMBULATORY_SURGERY_CENTER): Payer: Medicare Other | Admitting: Gastroenterology

## 2012-10-01 ENCOUNTER — Telehealth: Payer: Self-pay | Admitting: *Deleted

## 2012-10-01 ENCOUNTER — Other Ambulatory Visit (INDEPENDENT_AMBULATORY_CARE_PROVIDER_SITE_OTHER): Payer: Medicare Other

## 2012-10-01 ENCOUNTER — Other Ambulatory Visit: Payer: Self-pay | Admitting: *Deleted

## 2012-10-01 ENCOUNTER — Encounter: Payer: Self-pay | Admitting: Gastroenterology

## 2012-10-01 VITALS — BP 105/68 | HR 55 | Temp 97.8°F | Resp 27 | Ht 59.0 in | Wt 111.0 lb

## 2012-10-01 DIAGNOSIS — K269 Duodenal ulcer, unspecified as acute or chronic, without hemorrhage or perforation: Secondary | ICD-10-CM

## 2012-10-01 DIAGNOSIS — Z9889 Other specified postprocedural states: Secondary | ICD-10-CM

## 2012-10-01 DIAGNOSIS — R1013 Epigastric pain: Secondary | ICD-10-CM

## 2012-10-01 DIAGNOSIS — K315 Obstruction of duodenum: Secondary | ICD-10-CM

## 2012-10-01 DIAGNOSIS — I959 Hypotension, unspecified: Secondary | ICD-10-CM

## 2012-10-01 DIAGNOSIS — K219 Gastro-esophageal reflux disease without esophagitis: Secondary | ICD-10-CM

## 2012-10-01 LAB — HEMOGLOBIN AND HEMATOCRIT, BLOOD
HCT: 31.5 % — ABNORMAL LOW (ref 36.0–46.0)
Hemoglobin: 10.6 g/dL — ABNORMAL LOW (ref 12.0–15.0)

## 2012-10-01 MED ORDER — NEXIUM 40 MG PO CPDR: 40.0000 mg | DELAYED_RELEASE_CAPSULE | Freq: Every day | ORAL | Status: DC

## 2012-10-01 MED ORDER — SODIUM CHLORIDE 0.9 % IV SOLN: 500.0000 mL | INTRAVENOUS | Status: DC

## 2012-10-01 NOTE — Op Note (Signed)
[place]  [address]. [place], [id number]   ENDOSCOPY PROCEDURE REPORT  PATIENT: Cristina Parker, Cristina Parker  MR#: [id number] BIRTHDATE: 1936-01-14 , 44  yrs. old GENDER: Female ENDOSCOPIST:David Consuello Masse, MD, [facility] REFERRED BY: PROCEDURE DATE:  10/01/2012 PROCEDURE:   EGD w/ biopsy for H.pylori and EGD w/ tube or catheter placement ASA CLASS:    Class III INDICATIONS: Vomiting. MEDICATION: propofol (Diprivan) 231m IV TOPICAL ANESTHETIC:  DESCRIPTION OF PROCEDURE:   After the risks and benefits of the procedure were explained, informed consent was obtained.  The LB [id number]-[id number] endoscope was introduced through the mouth  and advanced to the second portion of the duodenum .  The instrument was slowly withdrawn as the mucosa was fully examined.      ESOPHAGUS: The mucosa of the esophagus appeared normal.  STOMACH: There was mild antral gastropathy noted.  Cold forcep biopsies were taken at the antrum.   Gastric volvulus was present.   DUODENUM: Stricture dilated with CRE guigewire pneumoststic ballon to 224m size and 6 ATM.Then able to pass stricture into duodenum sucessfully,,,see pictures.   Stricture dilated with CRE guigewire pneumoststic ballon to 2045msize and 6 ATM.Then able to pass stricture into duodenum sucessfully,,,see pictures. Pyloric stenosis noted and 2 cm ulcer in base of duodenal bulb with adherent clot noted.   Retroflexed views revealed no abnormalities. The scope was then withdrawn from the patient and the procedure completed.  COMPLICATIONS: There were no complications.   ENDOSCOPIC IMPRESSION: 1.   The mucosa of the esophagus appeared normal 2.   There was mild antral gastropathy noted [T2] ..CLMarland Kitchen Bx. done 3.  Duodenal Stricture dilated with CRE guigewire pneumoststic ballon to 20m32mize and 6 ATM.Then able to pass stricture into duodenum sucessfully,,,see pictures. 4 .Acute large duodenal ulcer..  RMarland KitchenCOMMENDATIONS: 1.   Await pathology results 2.  Continue current medications 3.  Follow clinical response to dilatation. 4.  Rx CLO if positive    _______________________________ eSigned:  DaviSable Feil, FACGEncompass Health Rehabilitation Hospital16/2014 9:03 AM      PATIENT NAME:  [name], [name]: [id number]

## 2012-10-01 NOTE — Progress Notes (Signed)
9924 Upon arrival to [facility] Bp low. Fluid bolus given and Neosynephine 100 mcq given.  Watching and monitoring BP closely.

## 2012-10-01 NOTE — Progress Notes (Signed)
Dr.Patterson reviewed h&h results, verbal okay to discharge patient.

## 2012-10-01 NOTE — Patient Instructions (Addendum)
Follow Dilation Diet today, clear liquids for 1 hour then soft diet rest of today. Also follow Gastroparesis diet for 1 week. Resume current medications. Pick up new prescription for Nexium from your pharmacy. Follow up office visit with Dr.Patterson on 10-30-12 at 11:00 am. Call us with any questions or concerns.  Handouts given on dilation diet, & gastroparesis diet. Thanks you!!   YOU HAD AN ENDOSCOPIC PROCEDURE TODAY AT [facility]: Refer to the procedure report that was given to you for any specific questions about what was found during the examination.  If the procedure report does not answer your questions, please call your gastroenterologist to clarify.  If you requested that your care partner not be given the details of your procedure findings, then the procedure report has been included in a sealed envelope for you to review at your convenience later.  YOU SHOULD EXPECT: Some feelings of bloating in the abdomen. Passage of more gas than usual.  Walking can help get rid of the air that was put into your GI tract during the procedure and reduce the bloating. If you had a lower endoscopy (such as a colonoscopy or flexible sigmoidoscopy) you may notice spotting of blood in your stool or on the toilet paper. If you underwent a bowel prep for your procedure, then you may not have a normal bowel movement for a few days.  DIET: See handouts. Drink plenty of fluids but you should avoid alcoholic beverages for 24 hours.  ACTIVITY: Your care partner should take you home directly after the procedure.  You should plan to take it easy, moving slowly for the rest of the day.  You can resume normal activity the day after the procedure however you should NOT DRIVE or use heavy machinery for 24 hours (because of the sedation medicines used during the test).    SYMPTOMS TO REPORT IMMEDIATELY: A gastroenterologist can be reached at any hour.  During normal business hours, 8:30 AM to 5:00 PM Monday  through Friday, call [phone].  After hours and on weekends, please call the GI answering service at [phone] who will take a message and have the physician on call contact you.   Following lower endoscopy (colonoscopy or flexible sigmoidoscopy):  Excessive amounts of blood in the stool  Significant tenderness or worsening of abdominal pains  Swelling of the abdomen that is new, acute  Fever of 100F or higher  Following upper endoscopy (EGD)  Vomiting of blood or coffee ground material  New chest pain or pain under the shoulder blades  Painful or persistently difficult swallowing  New shortness of breath  Fever of 100F or higher  Black, tarry-looking stools  FOLLOW UP: If any biopsies were taken you will be contacted by phone or by letter within the next 1-3 weeks.  Call your gastroenterologist if you have not heard about the biopsies in 3 weeks.  Our staff will call the home number listed on your records the next business day following your procedure to check on you and address any questions or concerns that you may have at that time regarding the information given to you following your procedure. This is a courtesy call and so if there is no answer at the home number and we have not heard from you through the emergency physician on call, we will assume that you have returned to your regular daily activities without incident.  SIGNATURES/CONFIDENTIALITY: You and/or your care partner have signed paperwork which will be entered into  your electronic medical record.  These signatures attest to the fact that that the information above on your After Visit Summary has been reviewed and is understood.  Full responsibility of the confidentiality of this discharge information lies with you and/or your care-partner.

## 2012-10-01 NOTE — Progress Notes (Signed)
Patient did not experience any of the following events: a burn prior to discharge; a fall within the facility; wrong site/side/patient/procedure/implant event; or a hospital transfer or hospital admission upon discharge from the facility. ([id number])Patient did not have preoperative order for IV antibiotic SSI prophylaxis. [phone])

## 2012-10-01 NOTE — Progress Notes (Signed)
Called to room to assist during endoscopic procedure.  Patient ID and intended procedure confirmed with present staff. Received instructions for my participation in the procedure from the performing physician.  Two nurses:  Vinnie Level and Santiago Glad

## 2012-10-01 NOTE — Progress Notes (Signed)
NO EGG OR SOY ALLERGY. EWM

## 2012-10-01 NOTE — Telephone Encounter (Signed)
Message copied by Lance Morin on Wed Oct 01, 2012 10:59 AM ------      Message from: Sharlett Iles, DAVID R      Created: Wed Oct 01, 2012 10:33 AM       Check hemoglobin, hematocrit, and iron levels next week.  She may need an IV iron infusion. ------

## 2012-10-01 NOTE — Telephone Encounter (Signed)
Will call pt in am to inform her of labs to be drawn next week post EGD on 10/01/12. She may also need IV Iron infusions.

## 2012-10-02 ENCOUNTER — Telehealth: Payer: Self-pay | Admitting: *Deleted

## 2012-10-02 LAB — HELICOBACTER PYLORI SCREEN-BIOPSY: UREASE: NEGATIVE

## 2012-10-02 NOTE — Telephone Encounter (Signed)
Informed pt Dr Sharlett Iles would like for her to repeat labs next week and do Iron studies to make sure her H&H remain stable and to see if she is iron deficient; if iron deficient, we may order IV Iron. Pt stated understanding and will come on 10/08/12. She reports she is fine today and she knows she may have discolored stools from bleeding yesterday. She was instructed to call if she has any problems over the holiday weekend.

## 2012-10-02 NOTE — Telephone Encounter (Signed)
  Follow up Call-  Call back number 10/01/2012 10/04/2010  Post procedure Call Back phone  # [phone]  Permission to leave phone message Yes -     Patient questions:  Do you have a fever, pain , or abdominal swelling? no Pain Score  0 *  Have you tolerated food without any problems? yes  Have you been able to return to your normal activities? yes  Do you have any questions about your discharge instructions: Diet   no Medications  no Follow up visit  no  Do you have questions or concerns about your Care? no  Actions: * If pain score is 4 or above: No action needed, pain <4.

## 2012-10-06 ENCOUNTER — Encounter: Payer: Self-pay | Admitting: Gastroenterology

## 2012-10-06 ENCOUNTER — Encounter: Payer: Self-pay | Admitting: *Deleted

## 2012-10-07 ENCOUNTER — Ambulatory Visit: Payer: Medicare Other | Admitting: Gastroenterology

## 2012-10-08 ENCOUNTER — Other Ambulatory Visit (INDEPENDENT_AMBULATORY_CARE_PROVIDER_SITE_OTHER): Payer: Medicare Other

## 2012-10-08 DIAGNOSIS — K269 Duodenal ulcer, unspecified as acute or chronic, without hemorrhage or perforation: Secondary | ICD-10-CM

## 2012-10-08 LAB — HEMOGLOBIN AND HEMATOCRIT, BLOOD: Hemoglobin: 11.1 g/dL — ABNORMAL LOW (ref 12.0–15.0)

## 2012-10-09 ENCOUNTER — Telehealth: Payer: Self-pay | Admitting: *Deleted

## 2012-10-09 MED ORDER — FERROUS FUM-IRON POLYSACCH 162-115.2 MG PO CAPS: ORAL_CAPSULE | ORAL | Status: DC

## 2012-10-09 NOTE — Telephone Encounter (Signed)
Informed pt of need for tandem use x 3 months and then we will repeat a CBC; pt stated understanding and reminder is in.

## 2012-10-09 NOTE — Telephone Encounter (Signed)
Message copied by Lance Morin on Thu Oct 09, 2012 11:23 AM ------      Message from: Sharlett Iles, DAVID R      Created: Wed Oct 08, 2012 11:10 AM       Tandem daily for 3 mos and then redo cbc ------

## 2012-10-21 ENCOUNTER — Encounter: Payer: Self-pay | Admitting: *Deleted

## 2012-10-21 NOTE — Telephone Encounter (Signed)
See lab results from 10/08/12, needs recheck in 3 months

## 2012-10-30 ENCOUNTER — Ambulatory Visit: Payer: Medicare Other | Admitting: Gastroenterology

## 2012-11-25 ENCOUNTER — Ambulatory Visit: Payer: Medicare Other | Admitting: Gastroenterology

## 2012-12-01 ENCOUNTER — Telehealth: Payer: Self-pay

## 2012-12-01 NOTE — Telephone Encounter (Signed)
Pt wanted Dr. Leward Quan to know that she has contacted [facility] for a refill of her vicodin. She has trouble with refills sometimes and wanted [place] to be aware of her request.  Pt [phone]

## 2012-12-02 ENCOUNTER — Encounter: Payer: Self-pay | Admitting: Gastroenterology

## 2012-12-02 ENCOUNTER — Ambulatory Visit (INDEPENDENT_AMBULATORY_CARE_PROVIDER_SITE_OTHER): Payer: Medicare Other | Admitting: Gastroenterology

## 2012-12-02 VITALS — BP 100/50 | HR 64 | Ht 58.25 in | Wt 115.2 lb

## 2012-12-02 DIAGNOSIS — R1013 Epigastric pain: Secondary | ICD-10-CM

## 2012-12-02 DIAGNOSIS — K315 Obstruction of duodenum: Secondary | ICD-10-CM

## 2012-12-02 DIAGNOSIS — K219 Gastro-esophageal reflux disease without esophagitis: Secondary | ICD-10-CM

## 2012-12-02 DIAGNOSIS — K269 Duodenal ulcer, unspecified as acute or chronic, without hemorrhage or perforation: Secondary | ICD-10-CM

## 2012-12-02 MED ORDER — HYDROCODONE-ACETAMINOPHEN 10-325 MG PO TABS: ORAL_TABLET | ORAL | Status: DC

## 2012-12-02 MED ORDER — NEXIUM 40 MG PO CPDR: 40.0000 mg | DELAYED_RELEASE_CAPSULE | Freq: Every day | ORAL | Status: DC

## 2012-12-02 NOTE — Patient Instructions (Signed)
  Refill of Nexium was sent to your pharmacy.  Please follow up with Dr. Sharlett Iles in one year or sooner if not better. __________________________________________________________________________________________________                                               We are excited to introduce MyChart, a new best-in-class service that provides you online access to important information in your electronic medical record. We want to make it easier for you to view your health information - all in one secure location - when and where you need it. We expect MyChart will enhance the quality of care and service we provide.  When you register for MyChart, you can:    View your test results.    Request appointments and receive appointment reminders via email.    Request medication renewals.    View your medical history, allergies, medications and immunizations.    Communicate with your physician's office through a password-protected site.    Conveniently print information such as your medication lists.  To find out if MyChart is right for you, please talk to a member of our clinical staff today. We will gladly answer your questions about this free health and wellness tool.  If you are age 63 or older and want a member of your family to have access to your record, you must provide written consent by completing a proxy form available at our office. Please speak to our clinical staff about guidelines regarding accounts for patients younger than age 52.  As you activate your MyChart account and need any technical assistance, please call the MyChart technical support line at [phone]) [phone] [phone]) or email your question to [email or web address]. If you email your question(s), please include your name, a return phone number and the best time to reach you.  If you have non-urgent health-related questions, you can send a message to our office through [place] at [email or web address]. If you  have a medical emergency, call 911.  Thank you for using MyChart as your new health and wellness resource!   MyChart licensed from [employer],  1999-2010. Patents Pending.

## 2012-12-02 NOTE — Progress Notes (Signed)
History of Present Illness: This is a 77 year old Caucasian female with chronic recurrent peptic ulcer disease.  She recently had balloon dilation of a strictured duodenum in active 2 cm ulcer.  She's been on Nexium regularly and is currently asymptomatic and has gained 6 pounds in weight.  She denies any GI or general medical problems.    Current Medications, Allergies, Past Medical History, Past Surgical History, Family History and Social History were reviewed in [employer] record.  ROS: All systems were reviewed and are negative unless otherwise stated in the HPI.         Assessment and plan: Continue to avoid alcohol, cigarettes, and NSAIDs with lifelong daily Nexium therapy.  We will dilate her in the future if needed.  She also is to call us the future on when necessary basis but to continue other medications per primary care Encounter Diagnoses  Name Primary?  . Other obstruction of duodenum Yes  . Abdominal pain, epigastric   . Duodenal ulcer disease   . Duodenal stricture   . Esophageal reflux

## 2012-12-02 NOTE — Telephone Encounter (Signed)
I called Hydrocodone-APAP 10-325 mg per tab to [facility] (#100 w/ no refills).

## 2012-12-04 ENCOUNTER — Telehealth: Payer: Self-pay

## 2012-12-04 NOTE — Telephone Encounter (Signed)
[facility] request refill Hydrocodone 10-367m

## 2012-12-04 NOTE — Telephone Encounter (Signed)
I called this med in on 12/02/12. Pt should call [facility].

## 2012-12-30 ENCOUNTER — Other Ambulatory Visit (INDEPENDENT_AMBULATORY_CARE_PROVIDER_SITE_OTHER): Payer: Medicare Other | Admitting: *Deleted

## 2012-12-30 ENCOUNTER — Ambulatory Visit: Payer: Medicare Other | Admitting: Neurosurgery

## 2012-12-30 DIAGNOSIS — I6529 Occlusion and stenosis of unspecified carotid artery: Secondary | ICD-10-CM

## 2012-12-30 DIAGNOSIS — Z48812 Encounter for surgical aftercare following surgery on the circulatory system: Secondary | ICD-10-CM

## 2012-12-31 ENCOUNTER — Other Ambulatory Visit: Payer: Self-pay | Admitting: *Deleted

## 2012-12-31 DIAGNOSIS — Z48812 Encounter for surgical aftercare following surgery on the circulatory system: Secondary | ICD-10-CM

## 2013-01-05 ENCOUNTER — Encounter: Payer: Self-pay | Admitting: Vascular Surgery

## 2013-01-08 ENCOUNTER — Telehealth: Payer: Self-pay | Admitting: *Deleted

## 2013-01-08 DIAGNOSIS — D649 Anemia, unspecified: Secondary | ICD-10-CM

## 2013-01-08 NOTE — Telephone Encounter (Signed)
Message copied by Lance Morin on Thu Jan 08, 2013 10:55 AM ------      Message from: Lance Morin      Created: Thu Oct 09, 2012 11:29 AM       Cbc in 3 months after tandem ------

## 2013-01-08 NOTE — Telephone Encounter (Signed)
Informed pt she needs to repeat a CBC after being on Tandem; pt stated understanding and will come in at her convenience.

## 2013-01-13 ENCOUNTER — Other Ambulatory Visit (INDEPENDENT_AMBULATORY_CARE_PROVIDER_SITE_OTHER): Payer: Medicare Other

## 2013-01-13 DIAGNOSIS — D649 Anemia, unspecified: Secondary | ICD-10-CM

## 2013-01-13 LAB — CBC WITH DIFFERENTIAL/PLATELET
Eosinophils Absolute: 0.4 10*3/uL (ref 0.0–0.7)
MCHC: 33.6 g/dL (ref 30.0–36.0)
MCV: 98.8 fl (ref 78.0–100.0)
Monocytes Absolute: 0.4 10*3/uL (ref 0.1–1.0)
Neutrophils Relative %: 70.8 % (ref 43.0–77.0)
Platelets: 190 10*3/uL (ref 150.0–400.0)

## 2013-01-23 ENCOUNTER — Telehealth: Payer: Self-pay | Admitting: Gastroenterology

## 2013-01-23 NOTE — Telephone Encounter (Signed)
Dr Sharlett Iles, please review CBC post 3 months of Tandem use and advise. Thanks.

## 2013-01-23 NOTE — Telephone Encounter (Signed)
Continue daily iron therapy.

## 2013-02-05 ENCOUNTER — Telehealth: Payer: Self-pay

## 2013-02-05 MED ORDER — HYDROCODONE-ACETAMINOPHEN 10-325 MG PO TABS: ORAL_TABLET | ORAL | Status: DC

## 2013-02-05 NOTE — Telephone Encounter (Signed)
Can someone take care of this for Dr Leward Quan?

## 2013-02-05 NOTE — Telephone Encounter (Signed)
I will authorize a prescription to get her through until Dr. Leward Quan is back in town. Then Dr. Leward Quan can take over prescribing again. I will not authorize 100 tab however. I have given her enough to last through the first couple of days of Dr. Janelle Floor return to allow for her to get in touch with her.

## 2013-02-05 NOTE — Telephone Encounter (Signed)
Pt called frustrated as she is having trouble getting her vicodin refilled, states that [facility] contacted [place] about this on Monday. Please note, Dr. Leward Quan is out of the office this week.  Pt cell [phone] Pt home [phone]

## 2013-02-05 NOTE — Telephone Encounter (Signed)
Spoke with pt advised an RX was sent but only partial. She will have to get the remainder of the RX from Dr Leward Quan when she gets back. FYI

## 2013-02-05 NOTE — Telephone Encounter (Signed)
Patient calling upset again that her prescription has to take so long to refill and she says she called Monday about this and that [facility] has faxed a refill request also.  There is no documentation that she called Monday requesting this. I told her it will take about 48-72 hours standard procedure for a provider to review and process this request. Patient says she will call [facility] and call us back to tell us that they did in fact send [place] a fax as a request.  Patient is upset with the service here in regards to her prescription refill for Vicodin. That it takes so long. Cristina Parker- clinical manager is aware, and told me to let her know our policy on refill request. Patient understood but is still very frustrated with the service here for her medication to be refilled.   [phone]

## 2013-02-05 NOTE — Telephone Encounter (Signed)
Please advise. Can we take care of this for Dr Leward Quan

## 2013-02-07 NOTE — Telephone Encounter (Signed)
I have reviewed the call notes and will await pt callback when she has exhausted the supply of pills ordered by Christell Faith, PA-C. Pt is due for f/u visit in Oct 2014.

## 2013-02-10 ENCOUNTER — Encounter (HOSPITAL_COMMUNITY): Payer: Self-pay | Admitting: *Deleted

## 2013-02-10 ENCOUNTER — Emergency Department (HOSPITAL_COMMUNITY): Payer: Medicare Other

## 2013-02-10 ENCOUNTER — Emergency Department (HOSPITAL_COMMUNITY)
Admission: EM | Admit: 2013-02-10 | Discharge: 2013-02-11 | Disposition: A | Discharge status: Home / Self Care | Payer: Medicare Other | Attending: Emergency Medicine | Admitting: Emergency Medicine

## 2013-02-10 DIAGNOSIS — M25559 Pain in unspecified hip: Secondary | ICD-10-CM | POA: Insufficient documentation

## 2013-02-10 DIAGNOSIS — F411 Generalized anxiety disorder: Secondary | ICD-10-CM | POA: Insufficient documentation

## 2013-02-10 DIAGNOSIS — F3289 Other specified depressive episodes: Secondary | ICD-10-CM | POA: Insufficient documentation

## 2013-02-10 DIAGNOSIS — E785 Hyperlipidemia, unspecified: Secondary | ICD-10-CM | POA: Insufficient documentation

## 2013-02-10 DIAGNOSIS — Z87891 Personal history of nicotine dependence: Secondary | ICD-10-CM | POA: Insufficient documentation

## 2013-02-10 DIAGNOSIS — I10 Essential (primary) hypertension: Secondary | ICD-10-CM | POA: Insufficient documentation

## 2013-02-10 DIAGNOSIS — M25551 Pain in right hip: Secondary | ICD-10-CM

## 2013-02-10 DIAGNOSIS — I4891 Unspecified atrial fibrillation: Secondary | ICD-10-CM | POA: Insufficient documentation

## 2013-02-10 DIAGNOSIS — G8929 Other chronic pain: Secondary | ICD-10-CM | POA: Insufficient documentation

## 2013-02-10 DIAGNOSIS — Z7902 Long term (current) use of antithrombotics/antiplatelets: Secondary | ICD-10-CM | POA: Insufficient documentation

## 2013-02-10 DIAGNOSIS — M545 Low back pain, unspecified: Secondary | ICD-10-CM | POA: Insufficient documentation

## 2013-02-10 DIAGNOSIS — Z8719 Personal history of other diseases of the digestive system: Secondary | ICD-10-CM | POA: Insufficient documentation

## 2013-02-10 DIAGNOSIS — K219 Gastro-esophageal reflux disease without esophagitis: Secondary | ICD-10-CM | POA: Insufficient documentation

## 2013-02-10 DIAGNOSIS — Z8701 Personal history of pneumonia (recurrent): Secondary | ICD-10-CM | POA: Insufficient documentation

## 2013-02-10 DIAGNOSIS — Z951 Presence of aortocoronary bypass graft: Secondary | ICD-10-CM | POA: Insufficient documentation

## 2013-02-10 DIAGNOSIS — Z7982 Long term (current) use of aspirin: Secondary | ICD-10-CM | POA: Insufficient documentation

## 2013-02-10 DIAGNOSIS — Z79899 Other long term (current) drug therapy: Secondary | ICD-10-CM | POA: Insufficient documentation

## 2013-02-10 DIAGNOSIS — D649 Anemia, unspecified: Secondary | ICD-10-CM | POA: Insufficient documentation

## 2013-02-10 DIAGNOSIS — F329 Major depressive disorder, single episode, unspecified: Secondary | ICD-10-CM | POA: Insufficient documentation

## 2013-02-10 DIAGNOSIS — Z8679 Personal history of other diseases of the circulatory system: Secondary | ICD-10-CM | POA: Insufficient documentation

## 2013-02-10 MED ORDER — OXYCODONE-ACETAMINOPHEN 5-325 MG PO TABS: 1.0000 | ORAL_TABLET | Freq: Four times a day (QID) | ORAL | Status: DC | PRN

## 2013-02-10 MED ORDER — OXYCODONE-ACETAMINOPHEN 5-325 MG PO TABS
1.0000 | ORAL_TABLET | Freq: Once | ORAL | Status: AC
  Administered 2013-02-10: 1 via ORAL
  Filled 2013-02-10: qty 1

## 2013-02-10 NOTE — ED Provider Notes (Signed)
Cristina Parker is a 77 y.o. female who is here tonight for right hip pain. She feels that she aggravated by walking a lot today. She's taking Vicodin when necessary for chronic low back pain. The right hip has hurt for 2 days. She had a fall, 2 weeks ago, without injury. She plans on seeing a new orthopedist because of recurrent right hip pain since surgery in April 2014. She denies recent fever, chills, nausea, vomiting  Exam alert, cooperative Right hip is tender to palpation and with passive range of motion. With assistance, unable to elevate her right leg at 45 off the stretcher, then she is able to hold it in this position for 30 seconds before, pulling it back, down to the stretcher.  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 This does not appear to be an unstable hip. She can be safely treated at home with use of oxycodone analgesia and walking with a walker. She should followup with her orthopedist of choice in one or 2 weeks.  Medical screening examination/treatment/procedure(s) were conducted as a shared visit with non-physician practitioner(s) and myself.  I personally evaluated the patient during the encounter  Richarda Blade, MD 02/11/13 [phone]

## 2013-02-10 NOTE — ED Notes (Signed)
Bed: RESB Expected date: 02/10/13 Expected time: 8:37 PM Means of arrival: Ambulance Comments: EMS--Hip pain

## 2013-02-10 NOTE — ED Provider Notes (Signed)
CSN: [id number]     Arrival date & time 02/10/13  2044 History   First MD Initiated Contact with Patient 02/10/13 2106     Chief Complaint  Patient presents with  . Hip Pain    right hip   (Consider location/radiation/quality/duration/timing/severity/associated sxs/prior Treatment) Patient is a 77 y.o. female presenting with hip pain. The history is provided by the patient. No language interpreter was used.  Hip Pain This is a new problem. The current episode started in the past 7 days. The problem occurs constantly. The problem has been gradually worsening. Associated symptoms include arthralgias. The symptoms are aggravated by walking.    Past Medical History  Diagnosis Date  . Heart disease   . Crohn's disease   . Duodenal stricture   . Esophageal reflux   . Hypertension   . Hyperlipemia   . Hiatal hernia   . Pneumonia   . Anemia   . Depression   . Collagenous colitis   . Anxiety   . Paroxysmal a-fib 07/09/2011  . Peripheral vascular disease   . Blood transfusion without reported diagnosis   . Duodenal ulcer    Past Surgical History  Procedure Laterality Date  . Coronary artery bypass graft    . Hip surgery      right  . Abdominal hysterectomy    . Cervical disc surgery    . Bunionectomy  1977    both feet   . Fracture surgery  1998    ORIF  . Eye surgery  10/2010    left eye cataract  . Carotid endarterectomy  05-1998    Left CEA  . Carotid endarterectomy  04/11/11    Right CEA  . Aorto- innominate bpg  2005    for innominate artery occlusive disease  . Colonoscopy     Family History  Problem Relation Age of Onset  . Colon polyps Father   . Heart disease Father   . Stroke Father   . Colon cancer Neg Hx   . Esophageal cancer Neg Hx   . Rectal cancer Neg Hx   . Stomach cancer Neg Hx   . Vascular Disease Brother   . Alcoholism Paternal Grandfather   . Heart disease Paternal Grandmother    History  Substance Use Topics  . Smoking status: Former  Smoker    Types: Cigarettes    Quit date: 06/18/1996  . Smokeless tobacco: Never Used  . Alcohol Use: 3.5 - 7 oz/week    7-14 drink(s) per week   OB History   Grav Para Term Preterm Abortions TAB SAB Ect Mult Living                 Review of Systems  Musculoskeletal: Positive for arthralgias.  All other systems reviewed and are negative.    Allergies  Review of patient's allergies indicates no known allergies.  Home Medications   Current Outpatient Rx  Name  Route  Sig  Dispense  Refill  . Ascorbic Acid (VITAMIN C) 1000 MG tablet   Oral   Take 1,000 mg by mouth daily.           Marland Kitchen aspirin 81 MG tablet   Oral   Take 81 mg by mouth daily.           Marland Kitchen atorvastatin (LIPITOR) 40 MG tablet   Oral   Take 1 tablet (40 mg total) by mouth daily.   90 tablet   3   . Calcium Citrate-Vitamin D 250-200 MG-UNIT  TABS   Oral   Take 2 tablets by mouth daily.           . citalopram (CELEXA) 20 MG tablet   Oral   Take 20 mg by mouth daily.         . clopidogrel (PLAVIX) 75 MG tablet   Oral   Take 1 tablet (75 mg total) by mouth daily.   90 tablet   3   . FeFum-FePo-FA-B Cmp-C-Zn-Mn-Cu (SE-TAN PLUS PO)   Oral   Take 1 tablet by mouth daily.         . ferrous fumarate-iron polysaccharide complex (TANDEM) 162-115.2 MG CAPS      Take one capsule daily for 3 months and we will recheck labs.   30 capsule   3   . HYDROcodone-acetaminophen (NORCO) 10-325 MG per tablet      Take 1/2 tablet every 8-12 hours prn moderately severe pain.   24 tablet   0   . lisinopril-hydrochlorothiazide (PRINZIDE,ZESTORETIC) 10-12.5 MG per tablet   Oral   Take 1 tablet by mouth daily.   90 tablet   3   . LORazepam (ATIVAN) 0.5 MG tablet   Oral   Take 0.25 mg by mouth 2 (two) times daily as needed for anxiety.         . metoprolol succinate (TOPROL-XL) 50 MG 24 hr tablet   Oral   Take 50 mg by mouth daily. Take with or immediately following a meal.         . NEXIUM 40 MG  capsule   Oral   Take 1 capsule (40 mg total) by mouth daily before breakfast.   30 capsule   11     Dispense as written.    BP 94/64  Pulse 70  Temp(Src) 97.5 F (36.4 C) (Oral)  Resp 18  SpO2 97% Physical Exam  Nursing note and vitals reviewed. Constitutional: She is oriented to person, place, and time. She appears well-developed and well-nourished.  HENT:  Head: Normocephalic and atraumatic.  Eyes: Pupils are equal, round, and reactive to light.  Neck: Normal range of motion.  Cardiovascular: Normal rate and regular rhythm.   Pulmonary/Chest: Effort normal and breath sounds normal.  Abdominal: Soft. Bowel sounds are normal.  Musculoskeletal: She exhibits tenderness.       Right hip: She exhibits decreased range of motion and tenderness. She exhibits no crepitus.       Legs: Lymphadenopathy:    She has no cervical adenopathy.  Neurological: She is alert and oriented to person, place, and time.  Skin: Skin is warm and dry.  Psychiatric: She has a normal mood and affect. Her behavior is normal. Judgment and thought content normal.    ED Course  Procedures (including critical care time) Labs Review Labs Reviewed - No data to display Imaging Review No results found. Patient discussed with and seen by Dr. Eulis Foster.  Doubt new fracture.  Has walker at home for use.  Will provide prescription for percocet. Follow-up with PCP and/or orthopedist. MDM  No diagnosis found. Hip pain.    Norman Herrlich, NP 02/11/13 [phone]

## 2013-02-10 NOTE — ED Notes (Signed)
Patient states that she fell backwards and fell on her buttocks. Initaial pain was minimal but over the past week the pain has increased  To where she has had limited ability to walk.  Currently she is not able to Ambulate.

## 2013-02-11 NOTE — ED Notes (Signed)
Patient is alert and oriented x3.  She was given DC instructions and follow up visit instructions.  Patient gave verbal understanding. She was DC ambulatory under her own power to home.  V/S stable.  He was not showing any signs of distress on DC

## 2013-03-13 ENCOUNTER — Ambulatory Visit (INDEPENDENT_AMBULATORY_CARE_PROVIDER_SITE_OTHER): Payer: Medicare Other | Admitting: *Deleted

## 2013-03-13 ENCOUNTER — Telehealth: Payer: Self-pay

## 2013-03-13 DIAGNOSIS — Z23 Encounter for immunization: Secondary | ICD-10-CM

## 2013-03-13 MED ORDER — HYDROCODONE-ACETAMINOPHEN 10-325 MG PO TABS: ORAL_TABLET | ORAL | Status: DC

## 2013-03-13 NOTE — Telephone Encounter (Signed)
Generic NORCO phoned to pt's pharmacy. She has a scheduled appt w/ me on 04/02/13. Pt is aware.

## 2013-03-13 NOTE — Telephone Encounter (Signed)
Patient would like Dr. Leward Quan to refill Norco.  Best# [phone]

## 2013-04-02 ENCOUNTER — Encounter: Payer: Self-pay | Admitting: Family Medicine

## 2013-04-02 ENCOUNTER — Ambulatory Visit (INDEPENDENT_AMBULATORY_CARE_PROVIDER_SITE_OTHER): Payer: Medicare Other | Admitting: Family Medicine

## 2013-04-02 VITALS — BP 90/64 | HR 65 | Temp 98.0°F | Resp 16 | Ht 59.5 in | Wt 116.0 lb

## 2013-04-02 DIAGNOSIS — I10 Essential (primary) hypertension: Secondary | ICD-10-CM

## 2013-04-02 DIAGNOSIS — M129 Arthropathy, unspecified: Secondary | ICD-10-CM

## 2013-04-02 DIAGNOSIS — M1990 Unspecified osteoarthritis, unspecified site: Secondary | ICD-10-CM

## 2013-04-02 DIAGNOSIS — Z9181 History of falling: Secondary | ICD-10-CM

## 2013-04-02 DIAGNOSIS — Z760 Encounter for issue of repeat prescription: Secondary | ICD-10-CM

## 2013-04-02 MED ORDER — HYDROCODONE-ACETAMINOPHEN 10-325 MG PO TABS: ORAL_TABLET | ORAL | Status: DC

## 2013-04-02 MED ORDER — LORAZEPAM 0.5 MG PO TABS: 0.2500 mg | ORAL_TABLET | Freq: Two times a day (BID) | ORAL | Status: DC | PRN

## 2013-04-02 MED ORDER — FERROUS FUM-IRON POLYSACCH 162-115.2 MG PO CAPS: ORAL_CAPSULE | ORAL | Status: DC

## 2013-04-02 MED ORDER — ESCITALOPRAM OXALATE 20 MG PO TABS: 20.0000 mg | ORAL_TABLET | Freq: Every day | ORAL | Status: DC

## 2013-04-03 NOTE — Progress Notes (Signed)
S:  This 77 y.o. Cauc female has HTN and SBP consistently below 100; pt maintains good hydration. Pt has hx of paroxysmal atrial fibrillation as well as CAD; last visit w/ Dr. Johnsie Cancel in Jan 2014. Pt sees Dr. Donnetta Hutching (vascular specialist) for carotid artery disease with stenosis. Carotid dopplers on December 29, 2012- R carotid endarterectomy site w/ minimal plaque at surgical site; L common carotid artery occluded w/ minimal flow in distal common carotid/bifurcation segment and L ext carotid w/ markedly dampened and retrograde flow. Pt endorses occasional lightheadedness but no dizziness, HA, numbness, weakness, slurred speech or syncope.  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 Pt received prescription for Percocet which she used for acute pain then resumed use of hydrocodone-APAP; she is advised ti inform specialist that she has pain medication. Pt scheduled to see Orthopedist for follow-up and persistent pain. Pt reports some unsteadiness w/ ambulation but she uses cane or walker at home. Her grandson has offered to get Life-Alert for pt.  PMHx, Soc Hx and Fam Hx reviewed. Problem List reviewed and Medications reconciled.   ROS: As per HPI. Otherwise unremarkable.  O: Filed Vitals:   04/02/13 0910  BP: 90/64  Pulse: 65  Temp: 98 F (36.7 C)  Resp: 16   GEN: In NAD; WN,WD.  Weight is up 5 lbs in 6 months. HENT: /AT; EOMI w/ clear conj/sclerae. EACs/nose/ oroph unremarkable. COR: RRR. LUNGS: Normal resp rate and effort. SKIN: W&D; intact w/o erythema, jaundice or ecchymoses. Mild pallor noted. NEURO: A&O x 3; CNs intact. Nonfocal.  A/P: HYPERTENSION- Stable; emphasize good hydration unless fluid restricted by cardiologist.  Arthritis- Follow-up w/ orthopedist as scheduled.  Medication refill  At risk for  falls- Encouraged to use cane or walker at all times.  Meds ordered this encounter  Medications  . DISCONTD: escitalopram (LEXAPRO) 20 MG tablet    Sig: Take 20 mg by mouth daily.  . ferrous fumarate-iron polysaccharide complex (TANDEM) 162-115.2 MG CAPS    Sig: Take one capsule daily for 3 months and we will recheck labs.    Dispense:  90 capsule    Refill:  1  . DISCONTD: escitalopram (LEXAPRO) 20 MG tablet    Sig: Take 1 tablet (20 mg total) by mouth daily.    Dispense:  30 tablet    Refill:  5  . escitalopram (LEXAPRO) 20 MG tablet    Sig: Take 1 tablet (20 mg total) by mouth daily.    Dispense:  90 tablet    Refill:  1  . HYDROcodone-acetaminophen (NORCO) 10-325 MG per tablet    Sig: Take 1/2 tablet every 8-12 hours prn moderately severe pain.    Dispense:  30 tablet    Refill:  0    Do not fill before Apr 12, 2013.    Order Specific Question:  Supervising Provider    Answer:  Wardell Honour [[id number]]  . DISCONTD: HYDROcodone-acetaminophen (NORCO) 10-325 MG per tablet    Sig: Take 1/2 tablet every 8-12 hours prn moderately severe pain.    Dispense:  30 tablet    Refill:  0    Do not fill before May 11, 2013.    Order Specific Question:  Supervising Provider    Answer:  Wardell Honour [[id number]]  . LORazepam (ATIVAN) 0.5 MG tablet    Sig: Take 0.5 tablets (0.25  mg total) by mouth 2 (two) times daily as needed for anxiety.    Dispense:  30 tablet    Refill:  1    Each prescription needs to last 30 days.  Marland Kitchen HYDROcodone-acetaminophen (NORCO) 10-325 MG per tablet    Sig: Take 1/2 tablet every 8-12 hours prn moderately severe pain.    Dispense:  30 tablet    Refill:  0    Do not fill before May 11, 2013.    Order Specific Question:  Supervising Provider    Answer:  Wardell Honour [[id number]]

## 2013-06-14 ENCOUNTER — Ambulatory Visit (INDEPENDENT_AMBULATORY_CARE_PROVIDER_SITE_OTHER): Payer: Medicare Other | Admitting: Family Medicine

## 2013-06-14 VITALS — BP 112/74 | HR 85 | Temp 97.9°F | Resp 16 | Ht 59.5 in | Wt 120.2 lb

## 2013-06-14 DIAGNOSIS — D539 Nutritional anemia, unspecified: Secondary | ICD-10-CM

## 2013-06-14 DIAGNOSIS — G894 Chronic pain syndrome: Secondary | ICD-10-CM

## 2013-06-14 DIAGNOSIS — R05 Cough: Secondary | ICD-10-CM

## 2013-06-14 DIAGNOSIS — J209 Acute bronchitis, unspecified: Secondary | ICD-10-CM

## 2013-06-14 LAB — POCT CBC
Lymph, poc: 1.1 (ref 0.6–3.4)
MCHC: 30.7 g/dL — AB (ref 31.8–35.4)
MPV: 6.9 fL (ref 0–99.8)
POC Granulocyte: 6.7 (ref 2–6.9)
POC LYMPH PERCENT: 13.7 %L (ref 10–50)
POC MID %: 6 %M (ref 0–12)
RDW, POC: 13.5 %

## 2013-06-14 LAB — VITAMIN B12: Vitamin B-12: 732 pg/mL (ref 211–911)

## 2013-06-14 MED ORDER — DOXYCYCLINE HYCLATE 100 MG PO CAPS: 100.0000 mg | ORAL_CAPSULE | Freq: Two times a day (BID) | ORAL | Status: DC

## 2013-06-14 MED ORDER — HYDROCODONE-ACETAMINOPHEN 10-325 MG PO TABS: ORAL_TABLET | ORAL | Status: DC

## 2013-06-14 NOTE — Patient Instructions (Signed)
[facility] Policy for Prescribing Controlled Substances (Revised 04/2012) 1. Prescriptions for controlled substances will be filled by ONE provider at [facility] with whom you have established and developed a plan for your care, including follow-up. 2. You are encouraged to schedule an appointment with your prescriber at our appointment center for follow-up visits whenever possible. 3. If you request a prescription for the controlled substance while at [facility] for an acute problem (with someone other than your regular prescriber), you MAY be given a ONE-TIME prescription for a 30-day supply of the controlled substance, to allow time for you to return to see your regular prescriber for additional prescriptions.  I recommend making a follow-up appointment with Dr. Leward Quan in [place] to additional refills of your hydrocodone so you don't have to call in multiple times over several weeks.

## 2013-06-14 NOTE — Progress Notes (Signed)
Subjective:  This chart was scribed for Delman Cheadle, MD by Donato Schultz, Medical Scribe. This patient was seen in Room 12 and the patient's care was started at 11:45 AM.   Patient ID: Cristina Parker, female    DOB: 1936/02/28, 77 y.o.   MRN: [id number] Chief Complaint  Patient presents with  . Head Congestion    Has had symptoms x 5 days   . Cough    dry, unproductive  . Sore Throat    Started 2 days ago     HPI HPI Comments: Cristina Parker is a 77 y.o. female with a history of Stage 3 Chronic Kidney Disease, COPD, Chron's Disease, Coronary Artery Disease, Carotid Artery Stenosis, Paroxysmal Arterial Fibrillation and a Lung Nodule who presents to the [facility] complaining of that constant non-productive cough, nasal congestion, and headache that started five days ago.  The patient states that her sore throat started a couple of days ago.  She states that her mucous is clear.  She denies otalgia, fever, and chills as associated symptoms.  She lists sinus pressure as associated symptoms.  She states that she took Mucinex for her symptoms.  The patient states that she has been diagnosed with Pneumonia in the past.  She denies taking Prednisone in the past.  She denies having taken any cough medications in the past.  She states that she has been around her grandchildren who have been sick.    The patient currently has a pain medicine contract with Dr. Leward Quan.  The patient states that she is in need of a refill for her Vicodin and does not have an appointment with Dr. Leward Quan until April of 2015.    Past Medical History  Diagnosis Date  . Heart disease   . Crohn's disease   . Duodenal stricture   . Esophageal reflux   . Hypertension   . Hyperlipemia   . Hiatal hernia   . Pneumonia   . Anemia   . Depression   . Collagenous colitis   . Anxiety   . Paroxysmal a-fib 07/09/2011  . Peripheral vascular disease   . Blood transfusion without reported diagnosis   .  Duodenal ulcer    Past Surgical History  Procedure Laterality Date  . Coronary artery bypass graft    . Hip surgery      right  . Abdominal hysterectomy    . Cervical disc surgery    . Bunionectomy  1977    both feet   . Fracture surgery  1998    ORIF  . Eye surgery  10/2010    left eye cataract  . Carotid endarterectomy  05-1998    Left CEA  . Carotid endarterectomy  04/11/11    Right CEA  . Aorto- innominate bpg  2005    for innominate artery occlusive disease  . Colonoscopy     Family History  Problem Relation Age of Onset  . Colon polyps Father   . Heart disease Father   . Stroke Father   . Colon cancer Neg Hx   . Esophageal cancer Neg Hx   . Rectal cancer Neg Hx   . Stomach cancer Neg Hx   . Vascular Disease Brother   . Alcoholism Paternal Grandfather   . Heart disease Paternal Grandmother    History   Social History  . Marital Status: Widowed    Spouse Name: N/A    Number of Children: 2  . Years of Education:  N/A   Occupational History  . retired Therapist, sports    Social History Main Topics  . Smoking status: Former Smoker    Types: Cigarettes    Quit date: 06/18/1996  . Smokeless tobacco: Never Used  . Alcohol Use: 3.5 - 7 oz/week    7-14 drink(s) per week  . Drug Use: No  . Sexual Activity: No   Other Topics Concern  . Not on file   Social History Narrative  . No narrative on file   No Known Allergies  Review of Systems  Constitutional: Positive for activity change, appetite change and fatigue. Negative for fever and chills.  HENT: Positive for congestion, rhinorrhea, sinus pressure, sneezing and sore throat. Negative for ear pain.   Respiratory: Positive for cough.   Neurological: Positive for headaches.  Hematological: Negative for adenopathy.  Psychiatric/Behavioral: Positive for sleep disturbance.     BP 112/74  Pulse 85  Temp(Src) 97.9 F (36.6 C) (Oral)  Resp 16  Ht 4' 11.5" (1.511 m)  Wt 120 lb 3.2 oz (54.522 kg)  BMI 23.88 kg/m2   SpO2 90% Objective:  Physical Exam  Nursing note and vitals reviewed. Constitutional: She is oriented to person, place, and time. She appears well-developed and well-nourished. No distress.  HENT:  Head: Normocephalic and atraumatic.  Right Ear: Tympanic membrane, external ear and ear canal normal.  Left Ear: Tympanic membrane, external ear and ear canal normal.  Mouth/Throat: Oropharynx is clear and moist. No oropharyngeal exudate.  Eyes: Conjunctivae and EOM are normal. Pupils are equal, round, and reactive to light.  Neck: Normal range of motion. Neck supple. No thyromegaly present.  Cardiovascular: Normal rate and regular rhythm.   Murmur heard.  Systolic murmur is present with a grade of 2/6  Best over left upper sternal boarder.   Pulmonary/Chest: Effort normal. No respiratory distress. She has decreased breath sounds in the right upper field, the right middle field, the right lower field, the left upper field, the left middle field and the left lower field. She has no wheezes. She has no rales.  Lymphadenopathy:    She has no cervical adenopathy.  Neurological: She is alert and oriented to person, place, and time.  Skin: Skin is warm and dry.  Psychiatric: She has a normal mood and affect. Her behavior is normal.   Results for orders placed in visit on 06/14/13  VITAMIN B12      Result Value Range   Vitamin B-12 732  211 - 911 pg/mL  FOLATE      Result Value Range   Folate >20.0    POCT CBC      Result Value Range   WBC 8.3  4.6 - 10.2 K/uL   Lymph, poc 1.1  0.6 - 3.4   POC LYMPH PERCENT 13.7  10 - 50 %L   MID (cbc) 0.5  0 - 0.9   POC MID % 6.0  0 - 12 %M   POC Granulocyte 6.7  2 - 6.9   Granulocyte percent 80.3 (*) 37 - 80 %G   RBC 3.56 (*) 4.04 - 5.48 M/uL   Hemoglobin 11.5 (*) 12.2 - 16.2 g/dL   HCT, POC 37.4 (*) 37.7 - 47.9 %   MCV 105.1 (*) 80 - 97 fL   MCH, POC 32.2 (*) 27 - 31.2 pg   MCHC 30.7 (*) 31.8 - 35.4 g/dL   RDW, POC 13.5     Platelet Count, POC 199   142 - 424 K/uL  MPV 6.9  0 - 99.8 fL    Assessment & Plan:  Cough - Plan: POCT CBC  Macrocytic anemia - Plan: Vitamin B12, Folate  Acute bronchitis - doxycycline  Chronic pain syndrome - Pt is receiving chronic narcotics- pt of Dr. Janelle Floor. Explained controlled sub policy to pt - that she needs to receive from only 1 provider. Refilled x 2 mos to give pt a chance to sched appt w/ PCP.  Meds ordered this encounter  Medications  . DISCONTD: doxycycline (VIBRAMYCIN) 100 MG capsule    Sig: Take 1 capsule (100 mg total) by mouth 2 (two) times daily.    Dispense:  14 capsule    Refill:  0  . HYDROcodone-acetaminophen (NORCO) 10-325 MG per tablet    Sig: Take 1/2 tablet every 8-12 hours prn moderately severe pain.    Dispense:  30 tablet    Refill:  0    Do not fill before Jun 14, 2013.    Order Specific Question:  Supervising Provider    Answer:  Wardell Honour [[id number]]  . doxycycline (VIBRAMYCIN) 100 MG capsule    Sig: Take 1 capsule (100 mg total) by mouth 2 (two) times daily.    Dispense:  14 capsule    Refill:  0  . HYDROcodone-acetaminophen (NORCO) 10-325 MG per tablet    Sig: Take 1/2 tablet every 8-12 hours prn moderately severe pain.    Dispense:  30 tablet    Refill:  0    Do not fill before Jul 15, 2013    Order Specific Question:  Supervising Provider    Answer:  Wardell Honour [[id number]]    I personally performed the services described in this documentation, which was scribed in my presence. The recorded information has been reviewed and considered, and addended by me as needed.  Delman Cheadle, MD MPH

## 2013-06-17 ENCOUNTER — Encounter: Payer: Self-pay | Admitting: Family Medicine

## 2013-06-24 ENCOUNTER — Emergency Department (HOSPITAL_COMMUNITY): Payer: Medicare Other

## 2013-06-24 ENCOUNTER — Emergency Department (HOSPITAL_COMMUNITY)
Admission: EM | Admit: 2013-06-24 | Discharge: 2013-06-24 | Disposition: A | Discharge status: Home / Self Care | Payer: Medicare Other | Attending: Emergency Medicine | Admitting: Emergency Medicine

## 2013-06-24 ENCOUNTER — Encounter (HOSPITAL_COMMUNITY): Payer: Self-pay | Admitting: Emergency Medicine

## 2013-06-24 DIAGNOSIS — D649 Anemia, unspecified: Secondary | ICD-10-CM | POA: Insufficient documentation

## 2013-06-24 DIAGNOSIS — Z9889 Other specified postprocedural states: Secondary | ICD-10-CM | POA: Insufficient documentation

## 2013-06-24 DIAGNOSIS — I10 Essential (primary) hypertension: Secondary | ICD-10-CM | POA: Insufficient documentation

## 2013-06-24 DIAGNOSIS — F411 Generalized anxiety disorder: Secondary | ICD-10-CM | POA: Insufficient documentation

## 2013-06-24 DIAGNOSIS — F3289 Other specified depressive episodes: Secondary | ICD-10-CM | POA: Insufficient documentation

## 2013-06-24 DIAGNOSIS — Z7902 Long term (current) use of antithrombotics/antiplatelets: Secondary | ICD-10-CM | POA: Insufficient documentation

## 2013-06-24 DIAGNOSIS — R5381 Other malaise: Secondary | ICD-10-CM | POA: Insufficient documentation

## 2013-06-24 DIAGNOSIS — Z7982 Long term (current) use of aspirin: Secondary | ICD-10-CM | POA: Insufficient documentation

## 2013-06-24 DIAGNOSIS — Z87891 Personal history of nicotine dependence: Secondary | ICD-10-CM | POA: Insufficient documentation

## 2013-06-24 DIAGNOSIS — Z8701 Personal history of pneumonia (recurrent): Secondary | ICD-10-CM | POA: Insufficient documentation

## 2013-06-24 DIAGNOSIS — Z951 Presence of aortocoronary bypass graft: Secondary | ICD-10-CM | POA: Insufficient documentation

## 2013-06-24 DIAGNOSIS — K219 Gastro-esophageal reflux disease without esophagitis: Secondary | ICD-10-CM | POA: Insufficient documentation

## 2013-06-24 DIAGNOSIS — E785 Hyperlipidemia, unspecified: Secondary | ICD-10-CM | POA: Insufficient documentation

## 2013-06-24 DIAGNOSIS — Z792 Long term (current) use of antibiotics: Secondary | ICD-10-CM | POA: Insufficient documentation

## 2013-06-24 DIAGNOSIS — Z79899 Other long term (current) drug therapy: Secondary | ICD-10-CM | POA: Insufficient documentation

## 2013-06-24 DIAGNOSIS — Z90710 Acquired absence of both cervix and uterus: Secondary | ICD-10-CM | POA: Insufficient documentation

## 2013-06-24 DIAGNOSIS — R5383 Other fatigue: Secondary | ICD-10-CM

## 2013-06-24 DIAGNOSIS — K859 Acute pancreatitis without necrosis or infection, unspecified: Secondary | ICD-10-CM

## 2013-06-24 DIAGNOSIS — I4891 Unspecified atrial fibrillation: Secondary | ICD-10-CM | POA: Insufficient documentation

## 2013-06-24 DIAGNOSIS — F329 Major depressive disorder, single episode, unspecified: Secondary | ICD-10-CM | POA: Insufficient documentation

## 2013-06-24 LAB — CBC WITH DIFFERENTIAL/PLATELET
BASOS ABS: 0 10*3/uL (ref 0.0–0.1)
Basophils Relative: 0 % (ref 0–1)
EOS PCT: 0 % (ref 0–5)
Eosinophils Absolute: 0.1 10*3/uL (ref 0.0–0.7)
HEMATOCRIT: 41.2 % (ref 36.0–46.0)
HEMOGLOBIN: 14.1 g/dL (ref 12.0–15.0)
LYMPHS ABS: 1.5 10*3/uL (ref 0.7–4.0)
LYMPHS PCT: 6 % — AB (ref 12–46)
MCH: 33.1 pg (ref 26.0–34.0)
MCHC: 34.2 g/dL (ref 30.0–36.0)
MCV: 96.7 fL (ref 78.0–100.0)
MONO ABS: 1.3 10*3/uL — AB (ref 0.1–1.0)
Monocytes Relative: 5 % (ref 3–12)
NEUTROS ABS: 21.5 10*3/uL — AB (ref 1.7–7.7)
Neutrophils Relative %: 88 % — ABNORMAL HIGH (ref 43–77)
Platelets: 261 10*3/uL (ref 150–400)
RBC: 4.26 MIL/uL (ref 3.87–5.11)
RDW: 12.9 % (ref 11.5–15.5)
WBC: 24.3 10*3/uL — AB (ref 4.0–10.5)

## 2013-06-24 LAB — URINE MICROSCOPIC-ADD ON

## 2013-06-24 LAB — COMPREHENSIVE METABOLIC PANEL
ALT: 16 U/L (ref 0–35)
AST: 36 U/L (ref 0–37)
Albumin: 3.1 g/dL — ABNORMAL LOW (ref 3.5–5.2)
Alkaline Phosphatase: 220 U/L — ABNORMAL HIGH (ref 39–117)
BILIRUBIN TOTAL: 2 mg/dL — AB (ref 0.3–1.2)
BUN: 38 mg/dL — ABNORMAL HIGH (ref 6–23)
CALCIUM: 9.3 mg/dL (ref 8.4–10.5)
CHLORIDE: 94 meq/L — AB (ref 96–112)
CO2: 21 meq/L (ref 19–32)
CREATININE: 0.95 mg/dL (ref 0.50–1.10)
GFR, EST AFRICAN AMERICAN: 66 mL/min — AB (ref >90)
GFR, EST NON AFRICAN AMERICAN: 57 mL/min — AB (ref >90)
GLUCOSE: 81 mg/dL (ref 70–99)
Potassium: 4 mEq/L (ref 3.7–5.3)
Sodium: 133 mEq/L — ABNORMAL LOW (ref 137–147)
Total Protein: 7.2 g/dL (ref 6.0–8.3)

## 2013-06-24 LAB — URINALYSIS, ROUTINE W REFLEX MICROSCOPIC
GLUCOSE, UA: NEGATIVE mg/dL
HGB URINE DIPSTICK: NEGATIVE
KETONES UR: NEGATIVE mg/dL
Nitrite: NEGATIVE
PROTEIN: NEGATIVE mg/dL
Specific Gravity, Urine: 1.024 (ref 1.005–1.030)
UROBILINOGEN UA: 1 mg/dL (ref 0.0–1.0)
pH: 5.5 (ref 5.0–8.0)

## 2013-06-24 LAB — ETHANOL: Alcohol, Ethyl (B): <11 mg/dL (ref 0–11)

## 2013-06-24 LAB — LIPASE, BLOOD: Lipase: 295 U/L — ABNORMAL HIGH (ref 11–59)

## 2013-06-24 MED ORDER — SODIUM CHLORIDE 0.9 % IV SOLN: 1000.0000 mL | INTRAVENOUS | Status: DC

## 2013-06-24 MED ORDER — HYDROCODONE-ACETAMINOPHEN 5-325 MG PO TABS: 1.0000 | ORAL_TABLET | ORAL | Status: DC | PRN

## 2013-06-24 MED ORDER — IOHEXOL 300 MG/ML  SOLN
25.0000 mL | Freq: Once | INTRAMUSCULAR | Status: AC | PRN
  Administered 2013-06-24: 25 mL via ORAL

## 2013-06-24 MED ORDER — ONDANSETRON HCL 4 MG/2ML IJ SOLN
4.0000 mg | Freq: Once | INTRAMUSCULAR | Status: AC
  Administered 2013-06-24: 4 mg via INTRAVENOUS
  Filled 2013-06-24: qty 2

## 2013-06-24 MED ORDER — IOHEXOL 300 MG/ML  SOLN
80.0000 mL | Freq: Once | INTRAMUSCULAR | Status: AC | PRN
  Administered 2013-06-24: 80 mL via INTRAVENOUS

## 2013-06-24 MED ORDER — MORPHINE SULFATE 4 MG/ML IJ SOLN: 4.0000 mg | Freq: Once | INTRAMUSCULAR | Status: DC

## 2013-06-24 MED ORDER — ONDANSETRON 8 MG PO TBDP: 8.0000 mg | ORAL_TABLET | Freq: Three times a day (TID) | ORAL | Status: DC | PRN

## 2013-06-24 MED ORDER — SODIUM CHLORIDE 0.9 % IV SOLN
1000.0000 mL | Freq: Once | INTRAVENOUS | Status: AC
  Administered 2013-06-24: 1000 mL via INTRAVENOUS

## 2013-06-24 NOTE — ED Notes (Signed)
Nausea Vomiting Diarrhea x 3 days

## 2013-06-24 NOTE — ED Notes (Signed)
Pt waiting on son to pick her up

## 2013-06-24 NOTE — ED Notes (Signed)
Pt ambulated in to Emergency.  Appears stable to hold for bed.

## 2013-06-24 NOTE — ED Notes (Signed)
IV team RN at bedside.

## 2013-06-24 NOTE — ED Notes (Signed)
Pt states that she went to urgent care on the 28th of dec for a "head cold".  States that she was having a cough but was put on abx.  Now c/o NV.  Denies diarrhea.  Denies pain.

## 2013-06-24 NOTE — ED Notes (Signed)
Patient transported to X-ray

## 2013-06-24 NOTE — ED Provider Notes (Addendum)
CSN: [id number]     Arrival date & time 06/24/13  1626 History   First MD Initiated Contact with Patient 06/24/13 1825     Chief Complaint  Patient presents with  . Cough  . Nausea  . Emesis    HPI Pt started with a cold on Dec 28th.  She saw an urgent care and was started on a course of abx.  She never felt that bad until this weekend when she started feeling very fatigues and weaker.  She started having some abdominal soreness but that has resolved.  She did vomit once a couple of days ago.  None since but she has felt nauseated.  She has not been able to eat and drink much though.  No diarrhea.  Maybe a low grade fever but never measured.  No dysuria.  Last alcohol use was last Thursday.  Symptoms have been worsening. Past Medical History  Diagnosis Date  . Heart disease   . Crohn's disease   . Duodenal stricture   . Esophageal reflux   . Hypertension   . Hyperlipemia   . Hiatal hernia   . Pneumonia   . Anemia   . Depression   . Collagenous colitis   . Anxiety   . Paroxysmal a-fib 07/09/2011  . Peripheral vascular disease   . Blood transfusion without reported diagnosis   . Duodenal ulcer    Past Surgical History  Procedure Laterality Date  . Coronary artery bypass graft    . Hip surgery      right  . Abdominal hysterectomy    . Cervical disc surgery    . Bunionectomy  1977    both feet   . Fracture surgery  1998    ORIF  . Eye surgery  10/2010    left eye cataract  . Carotid endarterectomy  05-1998    Left CEA  . Carotid endarterectomy  04/11/11    Right CEA  . Aorto- innominate bpg  2005    for innominate artery occlusive disease  . Colonoscopy     Family History  Problem Relation Age of Onset  . Colon polyps Father   . Heart disease Father   . Stroke Father   . Colon cancer Neg Hx   . Esophageal cancer Neg Hx   . Rectal cancer Neg Hx   . Stomach cancer Neg Hx   . Vascular Disease Brother   . Alcoholism Paternal Grandfather   . Heart disease Paternal  Grandmother    History  Substance Use Topics  . Smoking status: Former Smoker    Types: Cigarettes    Quit date: 06/18/1996  . Smokeless tobacco: Never Used  . Alcohol Use: 3.5 - 7 oz/week    7-14 drink(s) per week   OB History   Grav Para Term Preterm Abortions TAB SAB Ect Mult Living                 Review of Systems  Constitutional: Negative for fever.  HENT: Negative for sore throat.   Respiratory: Positive for cough.   Gastrointestinal: Negative for abdominal pain and blood in stool.  Genitourinary: Negative for dysuria.  Neurological: Negative for seizures and speech difficulty.  All other systems reviewed and are negative.    Allergies  Review of patient's allergies indicates no known allergies.  Home Medications   Current Outpatient Rx  Name  Route  Sig  Dispense  Refill  . aspirin 81 MG tablet   Oral   Take  81 mg by mouth every morning.          Marland Kitchen atorvastatin (LIPITOR) 40 MG tablet   Oral   Take 1 tablet (40 mg total) by mouth daily.   90 tablet   3   . Calcium Citrate-Vitamin D 250-200 MG-UNIT TABS   Oral   Take 2 tablets by mouth daily.           . clopidogrel (PLAVIX) 75 MG tablet   Oral   Take 75 mg by mouth at bedtime.         Marland Kitchen escitalopram (LEXAPRO) 20 MG tablet   Oral   Take 1 tablet (20 mg total) by mouth daily.   90 tablet   1   . ferrous fumarate-iron polysaccharide complex (TANDEM) 162-115.2 MG CAPS      Take one capsule daily for 3 months and we will recheck labs.   90 capsule   1   . HYDROcodone-acetaminophen (NORCO) 10-325 MG per tablet   Oral   Take 1 tablet by mouth See admin instructions. 1 tablet every 8-12 hours as needed for pain         . lisinopril-hydrochlorothiazide (PRINZIDE,ZESTORETIC) 10-12.5 MG per tablet   Oral   Take 1 tablet by mouth daily.   90 tablet   3   . LORazepam (ATIVAN) 0.5 MG tablet   Oral   Take 0.5 tablets (0.25 mg total) by mouth 2 (two) times daily as needed for anxiety.   30  tablet   1     Each prescription needs to last 30 days.   . metoprolol succinate (TOPROL-XL) 50 MG 24 hr tablet   Oral   Take 25 mg by mouth daily. with or immediately following a meal.         . NEXIUM 40 MG capsule   Oral   Take 1 capsule (40 mg total) by mouth daily before breakfast.   30 capsule   11     Dispense as written.   Marland Kitchen doxycycline (VIBRAMYCIN) 100 MG capsule   Oral   Take 1 capsule (100 mg total) by mouth 2 (two) times daily.   14 capsule   0    BP 101/64  Pulse 81  Temp(Src) 97.5 F (36.4 C) (Oral)  Resp 18  SpO2 99% Physical Exam  Nursing note and vitals reviewed. Constitutional: She appears well-nourished. No distress.  HENT:  Head: Normocephalic and atraumatic.  Right Ear: External ear normal.  Left Ear: External ear normal.  Eyes: Conjunctivae are normal. Right eye exhibits no discharge. Left eye exhibits no discharge. No scleral icterus.  Neck: Neck supple. No tracheal deviation present.  Cardiovascular: Normal rate, regular rhythm and intact distal pulses.   Pulmonary/Chest: Effort normal and breath sounds normal. No stridor. No respiratory distress. She has no wheezes. She has no rales.  Abdominal: Soft. Bowel sounds are normal. She exhibits no distension. There is tenderness (mild) in the epigastric area. There is no rigidity, no rebound and no guarding.  Musculoskeletal: She exhibits no edema and no tenderness.  Neurological: She is alert. She has normal strength. No cranial nerve deficit (no facial droop, extraocular movements intact, no slurred speech) or sensory deficit. She exhibits normal muscle tone. She displays no seizure activity. Coordination normal.  Skin: Skin is warm and dry. No rash noted. She is not diaphoretic.  Psychiatric: She has a normal mood and affect.    ED Course  Procedures (including critical care time) Labs Review Labs Reviewed  CBC WITH DIFFERENTIAL - Abnormal; Notable for the following:    WBC 24.3 (*)     Neutrophils Relative % 88 (*)    Neutro Abs 21.5 (*)    Lymphocytes Relative 6 (*)    Monocytes Absolute 1.3 (*)    All other components within normal limits  COMPREHENSIVE METABOLIC PANEL - Abnormal; Notable for the following:    Sodium 133 (*)    Chloride 94 (*)    BUN 38 (*)    Albumin 3.1 (*)    Alkaline Phosphatase 220 (*)    Total Bilirubin 2.0 (*)    GFR calc non Af Amer 57 (*)    GFR calc Af Amer 66 (*)    All other components within normal limits  LIPASE, BLOOD - Abnormal; Notable for the following:    Lipase 295 (*)    All other components within normal limits  URINALYSIS, ROUTINE W REFLEX MICROSCOPIC - Abnormal; Notable for the following:    Color, Urine ORANGE (*)    APPearance CLOUDY (*)    Bilirubin Urine SMALL (*)    Leukocytes, UA TRACE (*)    All other components within normal limits  URINE MICROSCOPIC-ADD ON - Abnormal; Notable for the following:    Casts HYALINE CASTS (*)    All other components within normal limits  ETHANOL   Imaging Review Dg Chest 2 View  06/24/2013   CLINICAL DATA:  Cough, nausea, vomiting, history hypertension, Crohn's disease, smoking  EXAM: CHEST  2 VIEW  COMPARISON:  DG CHEST 2V dated 01/31/2012  FINDINGS: Normal heart size post median sternotomy.  Tortuous aorta with scattered atherosclerotic calcifications.  Mediastinal contours and pulmonary vascularity normal.  Emphysematous changes with right upper lobe scarring, not significantly changed when accounting for differences in technique.  No definite acute infiltrate, pleural effusion, or pneumothorax.  Prior cervical spine fusion.  Advanced right glenohumeral degenerative changes and chronic rotator cuff tear.  Osseous demineralization with compression deformities of multiple thoracic vertebrae, grossly unchanged.  IMPRESSION: COPD changes with right upper lobe scarring.  No definite acute abnormalities.   Electronically Signed   By: Lavonia Dana M.D.   On: 06/24/2013 20:05   Ct Abdomen  Pelvis W Contrast  06/24/2013   CLINICAL DATA:  Cough, nausea, emesis  EXAM: CT ABDOMEN AND PELVIS WITH CONTRAST  TECHNIQUE: Multidetector CT imaging of the abdomen and pelvis was performed using the standard protocol following bolus administration of intravenous contrast.  CONTRAST:  50m OMNIPAQUE IOHEXOL 300 MG/ML SOLN, 216mOMNIPAQUE IOHEXOL 300 MG/ML SOLN  COMPARISON:  07/03/2011  FINDINGS: BODY WALL: Unremarkable.  LOWER CHEST: Emphysematous changes at the bases.  ABDOMEN/PELVIS:  Liver: Mild perfusion changes around the gallbladder fossa.  Biliary: Patchy gallbladder wall thickening, question adenomyomatosis. This finding is stable from prior.  Pancreas: There is enlargement and edematous change in the tail the pancreas, with peripancreatic fat stranding. No fluid collection, necrosis, or splenic vein/splenic artery abnormality.  Spleen: Unremarkable.  Adrenals: Thickening of the left adrenal bodies to 11 mm, stable and likely from adenomas.  Kidneys and ureters: Global right renal atrophy, progressed from 2013 and associated with advanced ostial atherosclerotic change. No hydronephrosis or stone.  Bladder: Unremarkable.  Reproductive: Hysterectomy.  Bowel: No obstruction.  Retroperitoneum: No mass or adenopathy.  Peritoneum: No free fluid or gas.  Vascular: Diffuse aortic and branch vessel atherosclerosis, with right renal artery stenosis as above.  OSSEOUS: No acute abnormalities. Remote intertrochanteric right femur fracture status post ORIF. The fracture lucency is  still visible, with sclerotic margins, compatible with osseous non union. Osteopenia. Dextroscoliosis with severe degenerative disc and facet changes. Remote T10 and T12 compression deformity. The  IMPRESSION: 1. Acute pancreatitis without necrosis or collection. 2. Right renal artery stenosis with renal atrophy. 3. Other chronic findings are stable from prior and noted above.   Electronically Signed   By: Jorje Guild M.D.   On: 06/24/2013  21:17    Medications  0.9 %  sodium chloride infusion (1,000 mLs Intravenous New Bag/Given 06/24/13 2126)    Followed by  0.9 %  sodium chloride infusion (not administered)  morphine 4 MG/ML injection 4 mg (not administered)  ondansetron (ZOFRAN) injection 4 mg (not administered)  iohexol (OMNIPAQUE) 300 MG/ML solution 80 mL (80 mLs Intravenous Contrast Given 06/24/13 2052)  iohexol (OMNIPAQUE) 300 MG/ML solution 25 mL (25 mLs Oral Contrast Given 06/24/13 2053)       MDM   1. Acute pancreatitis    Pt with BISAP score of 2 (age and BUN) placing her at slightly higher morbidity, mortality.  Pt denies recent alcohol use but may be the cause for her attack.  Pt is more comfortable in the ED.  Will consult medicine regarding admission for hydration, observation.   Kathalene Frames, MD 06/24/13 2205  Pt states she would like to try to go home.  She is in no distress.  Does not want any pain meds.  Will try oral trial.  2320  Pt ate a sandwich.  She feels well.  Will dc home with medications for pain and nausea.    Kathalene Frames, MD 06/24/13 1423  Kathalene Frames, MD 07/07/13 [phone]

## 2013-06-24 NOTE — Discharge Instructions (Signed)
Acute Pancreatitis Acute pancreatitis is a disease in which the pancreas becomes suddenly inflamed. The pancreas is a large gland located behind your stomach. The pancreas produces enzymes that help digest food. The pancreas also releases the hormones glucagon and insulin that help regulate blood sugar. Damage to the pancreas occurs when the digestive enzymes from the pancreas are activated and begin attacking the pancreas before being released into the intestine. Most acute attacks last a couple of days and can cause serious complications. Some people become dehydrated and develop low blood pressure. In severe cases, bleeding into the pancreas can lead to shock and can be life-threatening. The lungs, heart, and kidneys may fail. CAUSES  Pancreatitis can happen to anyone. In some cases, the cause is unknown. Most cases are caused by:  Alcohol abuse.  Gallstones. Other less common causes are:  Certain medicines.  Exposure to certain chemicals.  Infection.  Damage caused by an accident (trauma).  Abdominal surgery. SYMPTOMS   Pain in the upper abdomen that may radiate to the back.  Tenderness and swelling of the abdomen.  Nausea and vomiting. DIAGNOSIS  Your caregiver will perform a physical exam. Blood and stool tests may be done to confirm the diagnosis. Imaging tests may also be done, such as X-rays, CT scans, or an ultrasound of the abdomen. TREATMENT  Treatment usually requires a stay in the hospital. Treatment may include:  Pain medicine.  Fluid replacement through an intravenous line (IV).  Placing a tube in the stomach to remove stomach contents and control vomiting.  Not eating for 3 or 4 days. This gives your pancreas a rest, because enzymes are not being produced that can cause further damage.  Antibiotic medicines if your condition is caused by an infection.  Surgery of the pancreas or gallbladder. HOME CARE INSTRUCTIONS   Follow the diet advised by your  caregiver. This may involve avoiding alcohol and decreasing the amount of fat in your diet.  Eat smaller, more frequent meals. This reduces the amount of digestive juices the pancreas produces.  Drink enough fluids to keep your urine clear or pale yellow.  Only take over-the-counter or prescription medicines as directed by your caregiver.  Avoid drinking alcohol if it caused your condition.  Do not smoke.  Get plenty of rest.  Check your blood sugar at home as directed by your caregiver.  Keep all follow-up appointments as directed by your caregiver. SEEK MEDICAL CARE IF:   You do not recover as quickly as expected.  You develop new or worsening symptoms.  You have persistent pain, weakness, or nausea.  You recover and then have another episode of pain. SEEK IMMEDIATE MEDICAL CARE IF:   You are unable to eat or keep fluids down.  Your pain becomes severe.  You have a fever or persistent symptoms for more than 2 to 3 days.  You have a fever and your symptoms suddenly get worse.  Your skin or the white part of your eyes turn yellow (jaundice).  You develop vomiting.  You feel dizzy, or you faint.  Your blood sugar is high (over 300 mg/dL). MAKE SURE YOU:   Understand these instructions.  Will watch your condition.  Will get help right away if you are not doing well or get worse. Document Released: 06/04/2005 Document Revised: 12/04/2011 Document Reviewed: 09/13/2011 [facility] Patient Information 2014 [place].

## 2013-07-03 ENCOUNTER — Ambulatory Visit (INDEPENDENT_AMBULATORY_CARE_PROVIDER_SITE_OTHER): Payer: Medicare Other | Admitting: Family Medicine

## 2013-07-03 ENCOUNTER — Other Ambulatory Visit: Payer: Self-pay | Admitting: Family Medicine

## 2013-07-03 ENCOUNTER — Encounter: Payer: Self-pay | Admitting: Family Medicine

## 2013-07-03 VITALS — BP 80/60 | HR 105 | Temp 98.2°F | Resp 16 | Ht <= 58 in | Wt 113.4 lb

## 2013-07-03 DIAGNOSIS — I959 Hypotension, unspecified: Secondary | ICD-10-CM

## 2013-07-03 DIAGNOSIS — Z8719 Personal history of other diseases of the digestive system: Secondary | ICD-10-CM

## 2013-07-03 NOTE — Progress Notes (Signed)
S:  This 78 y.o. Cauc female has HTN; she was seen last week in ED and diagnosed w/ pancreatitis. She was treated and released. She has abstained from alcohol (white wine) and has increased fluid intake. She does not c/o thirst or dry mouth. She did not take Lisinopril or Metoprolol this morning. Pt denies CP or tightness, palpitations, SOB or weakness. She does endorse some lightheadedness if she gets up and moves to fast. She denies syncope.  Patient Active Problem List   Diagnosis Date Noted  . Cataract 09/25/2012  . Arthritis 09/25/2012  . Depression 09/25/2012  . Osteoporosis, unspecified 09/25/2012  . Duodenal stricture 09/16/2012  . Abdominal pain, epigastric 09/16/2012  . Murmur 12/19/2011  . Occlusion and stenosis of carotid artery without mention of cerebral infarction 11/15/2011  . Paroxysmal a-fib 07/09/2011  . Hypokalemia 07/06/2011  . COPD (chronic obstructive pulmonary disease) 07/03/2011  . OTHER ULCERATIVE COLITIS 08/09/2009  . PVD 10/31/2007  . HIATAL HERNIA 10/31/2007  . Other and unspecified hyperlipidemia 07/18/2007  . HYPERTENSION 07/18/2007  . CORONARY HEART DISEASE 07/18/2007  . VENTRICULAR ARRHYTHMIA 07/18/2007  . LUNG NODULE 07/18/2007  . G E R D 07/18/2007  . CAROTID ENDARTERECTOMY, LEFT, HX OF 07/18/2007   PMHx, Soc Hx reviewed.  Medications reconciled.  ROS: As per HPI.  O: Filed Vitals:   07/03/13 1134  BP: 80/60    This BP was obtained by Dr.Sherrise Liberto manually in R arm.  Pulse: 105  Temp: 98.2 F (36.8 C)  Resp: 16   GEN: in NAD: WN,WD. Appears frail. COR: Sinus tachycardia. No m/g/r. LUNGS: CTA w/o rales. ABD: Mild epig tenderness. SKIN: W&D; intact w/o diaphoresis. Mild pallor and fair turgor noted. NEURO: A&O x 3; CNs intact. Nonfocal.  A/P: Hypotension, unspecified- Pt encouraged to hydrate well, abstain from alcohol and no not take Lisinopril. Continue Metoprolol.  History of pancreatitis- Do not drink any alcohol.

## 2013-07-09 ENCOUNTER — Other Ambulatory Visit: Payer: Self-pay

## 2013-07-09 NOTE — Telephone Encounter (Signed)
Pharm reqs RF of lorazepam. I have pended for review.

## 2013-07-10 ENCOUNTER — Ambulatory Visit: Payer: Medicare Other | Admitting: Family Medicine

## 2013-07-10 MED ORDER — LORAZEPAM 0.5 MG PO TABS: 0.2500 mg | ORAL_TABLET | Freq: Two times a day (BID) | ORAL | Status: DC | PRN

## 2013-07-10 NOTE — Telephone Encounter (Signed)
Lorazepam refill phoned to pharmacy (#30 w/ 1 RF).

## 2013-07-16 ENCOUNTER — Ambulatory Visit (INDEPENDENT_AMBULATORY_CARE_PROVIDER_SITE_OTHER): Payer: Medicare Other | Admitting: Family Medicine

## 2013-07-16 ENCOUNTER — Encounter: Payer: Self-pay | Admitting: Family Medicine

## 2013-07-16 VITALS — BP 115/74 | HR 78 | Temp 97.4°F | Resp 16 | Ht 58.5 in | Wt 114.0 lb

## 2013-07-16 DIAGNOSIS — R2681 Unsteadiness on feet: Secondary | ICD-10-CM

## 2013-07-16 DIAGNOSIS — Z9181 History of falling: Secondary | ICD-10-CM

## 2013-07-16 DIAGNOSIS — R269 Unspecified abnormalities of gait and mobility: Secondary | ICD-10-CM

## 2013-07-16 DIAGNOSIS — R42 Dizziness and giddiness: Secondary | ICD-10-CM

## 2013-07-16 DIAGNOSIS — I10 Essential (primary) hypertension: Secondary | ICD-10-CM

## 2013-07-16 MED ORDER — HUGO ROLLING WALKER ELITE MISC: 1.0000 | Freq: Every day | Status: DC

## 2013-07-18 NOTE — Progress Notes (Signed)
S:  This 78 y.o. Cauc female has HTN, but recent BP readings have suggested over-medication w/ antihypertensives. She has been off Lisinopril for 2 weeks and reports feeling better- less lightheadedness and weakness. Pt denies Cp or tightness, palpitations, SOB or DOE, edema, unilateral numbness or syncope. She reports unsteadiness on her  feet; she thinks she has become apprehensive about ambulating around her home because of recent falls. There are no items or obstacles in her home that place her at risks for falls (throw rugs, electrical cords, small pets, etc), per her report.  Pt adamantly denies any recent alcohol intake; she maintains that she has increased her fluid intake (mostly water). She has a walker at home but it is cumbersome. She would like to have one of the newer model walkers that is light-weight and has a seat. Gait problems due to arthritis affect ability to walk.  Patient Active Problem List   Diagnosis Date Noted  . Cataract 09/25/2012  . Arthritis 09/25/2012  . Depression 09/25/2012  . Osteoporosis, unspecified 09/25/2012  . Duodenal stricture 09/16/2012  . Murmur 12/19/2011  . Occlusion and stenosis of carotid artery without mention of cerebral infarction 11/15/2011  . Paroxysmal a-fib 07/09/2011  . COPD (chronic obstructive pulmonary disease) 07/03/2011  . OTHER ULCERATIVE COLITIS 08/09/2009  . PVD 10/31/2007  . HIATAL HERNIA 10/31/2007  . Other and unspecified hyperlipidemia 07/18/2007  . HYPERTENSION 07/18/2007  . CORONARY HEART DISEASE 07/18/2007  . VENTRICULAR ARRHYTHMIA 07/18/2007  . LUNG NODULE 07/18/2007  . G E R D 07/18/2007  . CAROTID ENDARTERECTOMY, LEFT, HX OF 07/18/2007    Prior to Admission medications   Medication Sig Start Date End Date Taking? Authorizing Provider  aspirin 81 MG tablet Take 81 mg by mouth every morning.    Yes Historical Provider, MD  atorvastatin (LIPITOR) 40 MG tablet Take 1 tablet (40 mg total) by mouth daily. 06/19/12  Yes  Barton Fanny, MD  Calcium Citrate-Vitamin D 250-200 MG-UNIT TABS Take 2 tablets by mouth daily.     Yes Historical Provider, MD  clopidogrel (PLAVIX) 75 MG tablet Take 75 mg by mouth at bedtime.   Yes Historical Provider, MD  escitalopram (LEXAPRO) 20 MG tablet Take 1 tablet (20 mg total) by mouth daily. 04/02/13  Yes Barton Fanny, MD  HYDROcodone-acetaminophen [facility]) 10-325 MG per tablet Take 1 tablet by mouth See admin instructions. 1 tablet every 8-12 hours as needed for pain   Yes Historical Provider, MD  LORazepam (ATIVAN) 0.5 MG tablet Take 0.5 tablets (0.25 mg total) by mouth 2 (two) times daily as needed for anxiety. 07/09/13  Yes Barton Fanny, MD  metoprolol succinate (TOPROL-XL) 50 MG 24 hr tablet Take 25 mg by mouth daily. with or immediately following a meal.   Yes Historical Provider, MD  NEXIUM 40 MG capsule Take 1 capsule (40 mg total) by mouth daily before breakfast. 12/02/12  Yes Sable Feil, MD  lisinopril-hydrochlorothiazide (PRINZIDE,ZESTORETIC) 10-12.5 MG per tablet Take 1 tablet by mouth daily. 06/19/12   Barton Fanny, MD  Misc. Devices (HUGO ROLLING WALKER ELITE) MISC 1 Device by Does not apply route daily. 07/16/13   Barton Fanny, MD    PMHx, Surg Hx, Soc and Fam Hx reviewed.  ROS: As per HPI; otherwise noncontributory. Pt not sure if she has struck her head during falls.  O: Filed Vitals:   07/16/13 1135  BP: 115/74  Pulse: 78  Temp: 97.4 F (36.3 C)  Resp: 16  GEN: In NAD: WN,WD. HENT: [place]/AT; EOMI w/ clear conj/sclerae. Otherwise unremarkable. COR: RRR.  LUNGS: Normal resp rate and effort. SKIN: W&D; intact w/o diaphoresis, erythema or pallor. MS: MAEs; degenerative changes in all major joints. Gait is antalgic. Unsteady when pivoting to quickly. NEURO: A&O x 3; CNs intact. Gait as noted above.  A/P: Unsteady gait - R/O mini-strokes or intracranial injury due to previous falls.   Plan: CT Head Wo Contrast  Episodic  lightheadedness - Plan: CT Head Wo Contrast  HYPERTENSION- Stable on current medication; pt will remain off Lisinopril-HCT for now.  At risk for falls- Discussed PT referral; pt in agreement but first wants to have CT to rule out any CNS abnormality. Will prescribe Elite Walker w/ seat. Pt to take script to DME retailer.  Meds ordered this encounter  Medications  . Misc. Devices (HUGO ROLLING WALKER ELITE) MISC    Sig: 1 Device by Does not apply route daily.    Dispense:  1 each    Refill:  0    Pt needs walker (color preference is cobalt blue); it has braking device as well as a seat.

## 2013-07-20 ENCOUNTER — Telehealth: Payer: Self-pay

## 2013-07-20 ENCOUNTER — Telehealth: Payer: Self-pay | Admitting: Radiology

## 2013-07-20 DIAGNOSIS — R42 Dizziness and giddiness: Secondary | ICD-10-CM

## 2013-07-20 DIAGNOSIS — R2681 Unsteadiness on feet: Secondary | ICD-10-CM

## 2013-07-20 NOTE — Telephone Encounter (Signed)
Kim from [facility] called and stated that after reviewing notes for CT head w/o that was ordered, their Drs feel that an MRI Brain w/o contrast would be the test best suited for eval. Request change of order. Can someone else OK the change or wait for Dr Leward Quan? Phone # [phone] ext [phone] [facility]), case # [id number].

## 2013-07-20 NOTE — Addendum Note (Signed)
Addended byCandice Camp on: 07/20/2013 04:20 PM   Modules accepted: Orders

## 2013-07-20 NOTE — Telephone Encounter (Signed)
I have put in order for MRI scan, and cancelled the CT scan, the MRI is the preferred study for the patients complaints. I have advised patient and screened her for the MRI scan, there are no contraindications.

## 2013-07-20 NOTE — Telephone Encounter (Signed)
I have changed order, since preferred by insurance company, have called patient to verify there is not a contraindication for the study she does have cervical plates, advised these are fine since has been greater than 6 weeks since surgery.

## 2013-07-24 ENCOUNTER — Telehealth: Payer: Self-pay

## 2013-07-24 NOTE — Telephone Encounter (Signed)
Received req from [facility] for RF of lorazepam. Called [facility] [address] and verified that they did receive Rx written in Jan and pt filled on 07/16/13 for #30 (30 day supply) and has 1 RF left on that Rx, but too early to RF. I sent back denial to [facility] w/note when pt got last fill. Dr Leward Quan, [place].

## 2013-07-30 ENCOUNTER — Encounter: Payer: Self-pay | Admitting: Cardiovascular Disease

## 2013-07-30 ENCOUNTER — Ambulatory Visit (INDEPENDENT_AMBULATORY_CARE_PROVIDER_SITE_OTHER): Payer: Medicare Other | Admitting: Cardiovascular Disease

## 2013-07-30 VITALS — BP 106/60 | HR 68 | Ht <= 58 in | Wt 114.0 lb

## 2013-07-30 DIAGNOSIS — I359 Nonrheumatic aortic valve disorder, unspecified: Secondary | ICD-10-CM

## 2013-07-30 DIAGNOSIS — I480 Paroxysmal atrial fibrillation: Secondary | ICD-10-CM

## 2013-07-30 DIAGNOSIS — I4891 Unspecified atrial fibrillation: Secondary | ICD-10-CM

## 2013-07-30 DIAGNOSIS — I6529 Occlusion and stenosis of unspecified carotid artery: Secondary | ICD-10-CM

## 2013-07-30 DIAGNOSIS — I10 Essential (primary) hypertension: Secondary | ICD-10-CM

## 2013-07-30 DIAGNOSIS — I350 Nonrheumatic aortic (valve) stenosis: Secondary | ICD-10-CM

## 2013-07-30 NOTE — Assessment & Plan Note (Signed)
Stable with no angina and good activity level.  Continue medical Rx  

## 2013-07-30 NOTE — Patient Instructions (Signed)
Your physician wants you to follow-up in:   [place] will receive a reminder letter in the mail two months in advance. If you don't receive a letter, please call our office to schedule the follow-up appointment. Your physician recommends that you continue on your current medications as directed. Please refer to the Current Medication list given to you today. Your physician has requested that you have an echocardiogram. Echocardiography is a painless test that uses sound waves to create images of your heart. It provides your doctor with information about the size and shape of your heart and how well your heart's chambers and valves are working. This procedure takes approximately one hour. There are no restrictions for this procedure.  You have been referred to DR TODD EARLY ASAP  FOR  TOTAL OCCLUDED  CAROTID

## 2013-07-30 NOTE — Progress Notes (Signed)
Patient ID: OSHA RANE, female   DOB: 18-Sep-1935, 78 y.o.   MRN: [id number] 78 yo vasculopath. Previously seen by Dr Ron Parker. CAD with Esparto to LAD in 2005. This was a combined procedure with Dr Amedeo Plenty who did an innominate bypass for severe arch disease. Previously had LCEA in 1999. Lost to F/U. Had duplex 8/12 at [facility] office that I reviewed. Systolic velocity over 4cm/sec and greater than 90% stenosis in RICA. No angina, mild exertional dyspnea. Ambulation limited by hip pain not claudication. Compliant with meds.  Saw Dr Donnetta Hutching 6/13 With new disease in left carotid: Extensive supra-aortic peripheral vascular occlusive disease with a patent arch to innominate bypass. She does have a probable patency of her common carotid artery on the left with a markedly diminished flow with probable proximal stenosis. With her extensive left subclavian disease she would not be a candidate for subclavian carotid bypass. Repeat arch operation huge endeavor and Dr Donnetta Hutching not inclined to proceed at this time  CXR 8/13 stable COPD no flu or URI this winter yet   Duplex 7/56/43 with occluded LICA  Did not receive a call or have this discussed by [facility]     ROS: Denies fever, malais, weight loss, blurry vision, decreased visual acuity, cough, sputum, SOB, hemoptysis, pleuritic pain, palpitaitons, heartburn, abdominal pain, melena, lower extremity edema, claudication, or rash.  All other systems reviewed and negative  General:  BP lower in left arm Affect appropriate Healthy:  appears stated age 78: normal Neck supple with no adenopathy JVP normal bilateral subclavian and carotid  bruits  Higher pitched on right than left no thyromegaly Lungs clear with no wheezing and good diaphragmatic motion Heart:  S1/S2 mild AS murmur, no rub, gallop or click PMI normal Abdomen: benighn, BS positve, no tenderness, no AAA no bruit.  No HSM or HJR Distal pulses intact with no bruits No edema Neuro non-focal Skin warm and  dry No muscular weakness   Current Outpatient Prescriptions  Medication Sig Dispense Refill  . aspirin 81 MG tablet Take 81 mg by mouth every morning.       Marland Kitchen atorvastatin (LIPITOR) 40 MG tablet Take 1 tablet (40 mg total) by mouth daily.  90 tablet  3  . Calcium Citrate-Vitamin D 250-200 MG-UNIT TABS Take 2 tablets by mouth daily.        . clopidogrel (PLAVIX) 75 MG tablet Take 75 mg by mouth at bedtime.      Marland Kitchen escitalopram (LEXAPRO) 20 MG tablet Take 1 tablet (20 mg total) by mouth daily.  90 tablet  1  . HYDROcodone-acetaminophen (NORCO) 10-325 MG per tablet Take 1 tablet by mouth See admin instructions. 1 tablet every 8-12 hours as needed for pain      . LORazepam (ATIVAN) 0.5 MG tablet Take 0.5 tablets (0.25 mg total) by mouth 2 (two) times daily as needed for anxiety.  30 tablet  1  . metoprolol succinate (TOPROL-XL) 50 MG 24 hr tablet Take 25 mg by mouth daily. with or immediately following a meal.      . Misc. Devices (HUGO ROLLING WALKER ELITE) MISC 1 Device by Does not apply route daily.  1 each  0  . NEXIUM 40 MG capsule Take 1 capsule (40 mg total) by mouth daily before breakfast.  30 capsule  11  . lisinopril-hydrochlorothiazide (PRINZIDE,ZESTORETIC) 10-12.5 MG per tablet Take 1 tablet by mouth daily.  90 tablet  3   No current facility-administered medications for this visit.    Allergies  Review of patient's allergies indicates no known allergies.   Electrocardiogram:  1/16  SR vs accelerated juncitonal no ishcemic changes   Assessment and Plan

## 2013-07-30 NOTE — Assessment & Plan Note (Signed)
F/U echo r/o progressive AS

## 2013-07-30 NOTE — Assessment & Plan Note (Signed)
Maint NSR no palitatoins stable

## 2013-07-30 NOTE — Assessment & Plan Note (Signed)
Interval occlusion of LICA  Needs f/u with Dr Donnetta Hutching to discuss and see if anything needs to be done to lower stroke risk

## 2013-07-30 NOTE — Assessment & Plan Note (Signed)
Well controlled.  Continue current medications and low sodium Dash type diet.

## 2013-07-31 ENCOUNTER — Telehealth: Payer: Self-pay | Admitting: Vascular Surgery

## 2013-07-31 NOTE — Telephone Encounter (Addendum)
Message copied by Doristine Section on Fri Jul 31, 2013  4:15 PM ------      Message from: Denman George      Created: Thu Jul 30, 2013 11:49 AM      Regarding: needs appt. with TFE ASAP      Contact: [phone]       Dr. Johnsie Cancel is referring pt. To see Dr. Donnetta Hutching ASAP for total occlusion of left carotid.  Can we work her in on 2/17?  Hoyle Sauer revw'd her duplex of 12/2012; said to work her in ASAP per Nishan's request, and let TFE decide if he needs another Carotid US. ------  notified patient of fu appt. with dr. early on 08-04-13 9:30

## 2013-08-04 ENCOUNTER — Ambulatory Visit (INDEPENDENT_AMBULATORY_CARE_PROVIDER_SITE_OTHER): Payer: Medicare Other | Admitting: Vascular Surgery

## 2013-08-04 ENCOUNTER — Other Ambulatory Visit: Payer: Medicare Other

## 2013-08-04 ENCOUNTER — Encounter: Payer: Self-pay | Admitting: Vascular Surgery

## 2013-08-04 ENCOUNTER — Other Ambulatory Visit: Payer: Self-pay | Admitting: Vascular Surgery

## 2013-08-04 ENCOUNTER — Encounter (INDEPENDENT_AMBULATORY_CARE_PROVIDER_SITE_OTHER): Payer: Self-pay

## 2013-08-04 VITALS — BP 82/51 | HR 81 | Resp 18 | Ht 59.0 in | Wt 116.0 lb

## 2013-08-04 DIAGNOSIS — I6529 Occlusion and stenosis of unspecified carotid artery: Secondary | ICD-10-CM

## 2013-08-04 DIAGNOSIS — Z48812 Encounter for surgical aftercare following surgery on the circulatory system: Secondary | ICD-10-CM

## 2013-08-04 NOTE — Progress Notes (Signed)
Patient is a for discussion of her sensitive supra-aortic trunk -- cranial true vascular occlusive disease. Her last duplex in our office was in July 2014. She is status post aorto innominate bypass in 2005 at the same setting of coronary artery bypass grafting. She had undergone a left carotid endarterectomy in 1999. These were by Dr. Amedeo Plenty. She also underwent right carotid endarterectomy by myself in October of 2012. Her most recent duplex look exactly like the one before that 6 months. This showed a widely patent endarterectomy on the right and suggestion of occlusion of her common carotid artery on the left. Should undergo a CT angiogram for evaluation of this in May of 2013 and this did show trickle flow in the left common versus retrograde flow at the bifurcation. She was not had any symptoms of this and really would require repeat median sternotomy since she has a critical stenosis in her left subclavian takeoff as well. She is not a candidate for carotid subclavian bypass. Since she has remained asymptomatic there was recommendation for observation only with yearly duplex.  She does report she has remained stable from cardiac standpoint but does have echocardiogram for followup later this week.  Past Medical History  Diagnosis Date  . Heart disease   . Crohn's disease   . Duodenal stricture   . Esophageal reflux   . Hypertension   . Hyperlipemia   . Hiatal hernia   . Pneumonia   . Anemia   . Depression   . Collagenous colitis   . Anxiety   . Paroxysmal a-fib 07/09/2011  . Peripheral vascular disease   . Blood transfusion without reported diagnosis   . Duodenal ulcer     History  Substance Use Topics  . Smoking status: Former Smoker    Types: Cigarettes    Quit date: 06/18/1996  . Smokeless tobacco: Never Used  . Alcohol Use: 3.5 - 7 oz/week    7-14 drink(s) per week    Family History  Problem Relation Age of Onset  . Colon polyps Father   . Heart disease Father   . Stroke  Father   . Colon cancer Neg Hx   . Esophageal cancer Neg Hx   . Rectal cancer Neg Hx   . Stomach cancer Neg Hx   . Vascular Disease Brother   . Alcoholism Paternal Grandfather   . Heart disease Paternal Grandmother     No Known Allergies  Current outpatient prescriptions:aspirin 81 MG tablet, Take 81 mg by mouth every morning. , Disp: , Rfl: ;  atorvastatin (LIPITOR) 40 MG tablet, Take 1 tablet (40 mg total) by mouth daily., Disp: 90 tablet, Rfl: 3;  Calcium Citrate-Vitamin D 250-200 MG-UNIT TABS, Take 2 tablets by mouth daily.  , Disp: , Rfl: ;  clopidogrel (PLAVIX) 75 MG tablet, Take 75 mg by mouth at bedtime., Disp: , Rfl:  escitalopram (LEXAPRO) 20 MG tablet, Take 1 tablet (20 mg total) by mouth daily., Disp: 90 tablet, Rfl: 1;  HYDROcodone-acetaminophen (NORCO) 10-325 MG per tablet, Take 1 tablet by mouth See admin instructions. 1/2 tablet every 8-12 hours as needed for pain, Disp: , Rfl: ;  LORazepam (ATIVAN) 0.5 MG tablet, Take 0.5 tablets (0.25 mg total) by mouth 2 (two) times daily as needed for anxiety., Disp: 30 tablet, Rfl: 1 metoprolol succinate (TOPROL-XL) 50 MG 24 hr tablet, Take 25 mg by mouth daily. with or immediately following a meal., Disp: , Rfl: ;  Misc. Devices (HUGO ROLLING WALKER ELITE) MISC, 1 Device  by Does not apply route daily., Disp: 1 each, Rfl: 0;  NEXIUM 40 MG capsule, Take 1 capsule (40 mg total) by mouth daily before breakfast., Disp: 30 capsule, Rfl: 11 lisinopril-hydrochlorothiazide (PRINZIDE,ZESTORETIC) 10-12.5 MG per tablet, Take 1 tablet by mouth daily., Disp: 90 tablet, Rfl: 3  BP 82/51  Pulse 81  Resp 18  Ht 4' 11" (1.499 m)  Wt 116 lb (52.617 kg)  BMI 23.42 kg/m2  Body mass index is 23.42 kg/(m^2).       On physical exam she is a alert oriented female in no acute distress. She is thin. She does have a harsh left subclavian bruit and moderate right carotid bruit. She has a soft left carotid bruit. She does have a 1-2+ right radial pulse with  absent left radial pulse. Respirations are equal and nonlabored Neurologically she is grossly intact  Impression and plan extensive disease with no neurologic deficits. She does report that she does have some dizziness when first arising. She compensates by sitting at the bedside and arising to walk. It certainly is possible that she could have some posterior circulation ischemia but a very difficult time at correcting this. In the surgery would require probable repeat median sternotomy since her extra-anatomic options are quite limited due to her extensive proximal disease. Patient understands this and would recommend further evaluation and treatment only for focal deficits. She will see [place] in July as planned for repeat of her yearly carotid duplex. She will notify should she develop any new neurologic deficits

## 2013-08-06 ENCOUNTER — Other Ambulatory Visit (HOSPITAL_COMMUNITY): Payer: Medicare Other

## 2013-08-10 ENCOUNTER — Inpatient Hospital Stay: Admission: RE | Admit: 2013-08-10 | Payer: Medicare Other | Source: Ambulatory Visit

## 2013-08-11 ENCOUNTER — Ambulatory Visit (INDEPENDENT_AMBULATORY_CARE_PROVIDER_SITE_OTHER): Payer: Medicare Other | Admitting: Emergency Medicine

## 2013-08-11 VITALS — BP 118/76 | HR 76 | Temp 97.6°F | Resp 18 | Ht 59.0 in | Wt 116.2 lb

## 2013-08-11 DIAGNOSIS — R151 Fecal smearing: Secondary | ICD-10-CM

## 2013-08-11 DIAGNOSIS — R059 Cough, unspecified: Secondary | ICD-10-CM

## 2013-08-11 DIAGNOSIS — R05 Cough: Secondary | ICD-10-CM

## 2013-08-11 DIAGNOSIS — H109 Unspecified conjunctivitis: Secondary | ICD-10-CM

## 2013-08-11 MED ORDER — ERYTHROMYCIN 5 MG/GM OP OINT: 1.0000 "application " | TOPICAL_OINTMENT | Freq: Three times a day (TID) | OPHTHALMIC | Status: DC

## 2013-08-11 MED ORDER — BENZONATATE 100 MG PO CAPS: 100.0000 mg | ORAL_CAPSULE | Freq: Three times a day (TID) | ORAL | Status: DC | PRN

## 2013-08-11 NOTE — Progress Notes (Signed)
   Subjective:    Patient ID: Cristina Parker, female    DOB: [birth year], 78 y.o.   MRN: [id number]  HPI Bilateral eye reddness and itching with crusting over of eyes in am for two days.  Mild photophobia.  No visual changes.  Also notes cough for one week.  No fever or chills.  Cough is nonproductive and mild.  No shortness of breath or chest pain.  No runny nose or sore throat.  PPMH:  Hypertension, CAD  SH:  Former smoker, quit 16 years ago.,  Occasional alcohol.   Review of Systems  Constitutional: Negative for fever and chills.  HENT: Negative for rhinorrhea, sinus pressure and sore throat.   Eyes: Positive for photophobia, discharge, redness and itching.  Respiratory: Positive for cough. Negative for shortness of breath and wheezing.   Cardiovascular: Negative for chest pain, palpitations and leg swelling.  Gastrointestinal: Negative for abdominal pain.  Skin: Negative for rash.       Objective:   Physical Exam Blood pressure 118/76, pulse 76, temperature 97.6 F (36.4 C), temperature source Oral, resp. rate 18, height _0  (1.499 m), weight 116 lb 3.2 oz (52.708 kg), SpO2 90.00%. Body mass index is 23.46 kg/(m^2). Well-developed, well nourished female who is awake, alert and oriented, in NAD. HEENT: [place]/AT, PERRL, EOMI.  Conjunctiva injected bilaterally.   Nasal mucosa is pink and moist. OP is clear. Neck: supple, non-tender, no lymphadenopathy, thyromegaly. Heart: RRR, AS murmur present Lungs: normal effort, CTA Abdomen: normo-active bowel sounds, supple, non-tender, no mass or organomegaly. Extremities: no cyanosis, clubbing or edema. Skin: warm and dry without rash. Psychologic: good mood and appropriate affect, normal speech and behavior.     Assessment & Plan:  Conjuctivitis will treat with emycin ointment.  Cough given rx for tesselon perles.   Return if symptoms worsen.

## 2013-08-11 NOTE — Patient Instructions (Signed)
Cough, Adult  A cough is a reflex that helps clear your throat and airways. It can help heal the body or may be a reaction to an irritated airway. A cough may only last 2 or 3 weeks (acute) or may last more than 8 weeks (chronic).  CAUSES Acute cough:  Viral or bacterial infections. Chronic cough:  Infections.  Allergies.  Asthma.  Post-nasal drip.  Smoking.  Heartburn or acid reflux.  Some medicines.  Chronic lung problems (COPD).  Cancer. SYMPTOMS   Cough.  Fever.  Chest pain.  Increased breathing rate.  High-pitched whistling sound when breathing (wheezing).  Colored mucus that you cough up (sputum). TREATMENT   A bacterial cough may be treated with antibiotic medicine.  A viral cough must run its course and will not respond to antibiotics.  Your caregiver may recommend other treatments if you have a chronic cough. HOME CARE INSTRUCTIONS   Only take over-the-counter or prescription medicines for pain, discomfort, or fever as directed by your caregiver. Use cough suppressants only as directed by your caregiver.  Use a cold steam vaporizer or humidifier in your bedroom or home to help loosen secretions.  Sleep in a semi-upright position if your cough is worse at night.  Rest as needed.  Stop smoking if you smoke. SEEK IMMEDIATE MEDICAL CARE IF:   You have pus in your sputum.  Your cough starts to worsen.  You cannot control your cough with suppressants and are losing sleep.  You begin coughing up blood.  You have difficulty breathing.  You develop pain which is getting worse or is uncontrolled with medicine.  You have a fever. MAKE SURE YOU:   Understand these instructions.  Will watch your condition.  Will get help right away if you are not doing well or get worse. Document Released: 12/01/2010 Document Revised: 08/27/2011 Document Reviewed: 12/01/2010 [facility] Patient Information 2014 [place]. Conjunctivitis Conjunctivitis is  commonly called "pink eye." Conjunctivitis can be caused by bacterial or viral infection, allergies, or injuries. There is usually redness of the lining of the eye, itching, discomfort, and sometimes discharge. There may be deposits of matter along the eyelids. A viral infection usually causes a watery discharge, while a bacterial infection causes a yellowish, thick discharge. Pink eye is very contagious and spreads by direct contact. You may be given antibiotic eyedrops as part of your treatment. Before using your eye medicine, remove all drainage from the eye by washing gently with warm water and cotton balls. Continue to use the medication until you have awakened 2 mornings in a row without discharge from the eye. Do not rub your eye. This increases the irritation and helps spread infection. Use separate towels from other household members. Wash your hands with soap and water before and after touching your eyes. Use cold compresses to reduce pain and sunglasses to relieve irritation from light. Do not wear contact lenses or wear eye makeup until the infection is gone. SEEK MEDICAL CARE IF:   Your symptoms are not better after 3 days of treatment.  You have increased pain or trouble seeing.  The outer eyelids become very red or swollen. Document Released: 07/12/2004 Document Revised: 08/27/2011 Document Reviewed: 06/04/2005 [facility] Patient Information 2014 [place].

## 2013-08-17 ENCOUNTER — Ambulatory Visit
Admission: RE | Admit: 2013-08-17 | Discharge: 2013-08-17 | Disposition: A | Discharge status: Home / Self Care | Payer: Medicare Other | Source: Ambulatory Visit | Attending: Family Medicine | Admitting: Family Medicine

## 2013-08-17 DIAGNOSIS — R42 Dizziness and giddiness: Secondary | ICD-10-CM

## 2013-08-17 DIAGNOSIS — R2681 Unsteadiness on feet: Secondary | ICD-10-CM

## 2013-08-18 ENCOUNTER — Encounter: Payer: Medicare Other | Admitting: Vascular Surgery

## 2013-08-22 ENCOUNTER — Telehealth: Payer: Self-pay | Admitting: Family Medicine

## 2013-08-22 DIAGNOSIS — R2681 Unsteadiness on feet: Secondary | ICD-10-CM

## 2013-08-22 DIAGNOSIS — Z9181 History of falling: Secondary | ICD-10-CM

## 2013-08-22 DIAGNOSIS — M1990 Unspecified osteoarthritis, unspecified site: Secondary | ICD-10-CM

## 2013-08-22 NOTE — Telephone Encounter (Signed)
I phoned pt to give her results of recent MR Head- no acute findings; chronic small vessel ischemic disease and cerebral atrophy. I have recommended she get PT to help w/ gait/ core strengthening and fall prevention. She has [employer] supplement so I suggested she contact them next week to inquire about in-home services and PT. I will refer her to PT at a facility for initial assessment and treatment plan. She agrees.

## 2013-08-24 ENCOUNTER — Other Ambulatory Visit (HOSPITAL_COMMUNITY): Payer: Medicare Other

## 2013-08-25 ENCOUNTER — Other Ambulatory Visit: Payer: Self-pay | Admitting: Family Medicine

## 2013-08-28 ENCOUNTER — Telehealth: Payer: Self-pay

## 2013-08-28 MED ORDER — HYDROCODONE-ACETAMINOPHEN 10-325 MG PO TABS: ORAL_TABLET | ORAL | Status: DC

## 2013-08-28 NOTE — Telephone Encounter (Signed)
Hydrocodone -APAP prescription has been printed and will be at [facility] for pick-up. Pt needs to sign for this.

## 2013-08-28 NOTE — Telephone Encounter (Signed)
Pt is calling for a refill on vicodin

## 2013-08-31 NOTE — Telephone Encounter (Signed)
I printed this RX on 08/28/2013 but I think it actually was taken to [facility] for pick-up (I accidentally put 104). I am pretty sure it is at [facility].

## 2013-08-31 NOTE — Telephone Encounter (Signed)
I can not locate this prescription, please advise.

## 2013-09-01 ENCOUNTER — Telehealth: Payer: Self-pay

## 2013-09-01 NOTE — Telephone Encounter (Signed)
Thank you

## 2013-09-09 ENCOUNTER — Ambulatory Visit (HOSPITAL_COMMUNITY): Payer: Medicare Other | Attending: Cardiovascular Disease | Admitting: Radiology

## 2013-09-09 DIAGNOSIS — R011 Cardiac murmur, unspecified: Secondary | ICD-10-CM

## 2013-09-09 DIAGNOSIS — I4891 Unspecified atrial fibrillation: Secondary | ICD-10-CM | POA: Insufficient documentation

## 2013-09-09 DIAGNOSIS — I359 Nonrheumatic aortic valve disorder, unspecified: Secondary | ICD-10-CM | POA: Insufficient documentation

## 2013-09-09 DIAGNOSIS — I480 Paroxysmal atrial fibrillation: Secondary | ICD-10-CM

## 2013-09-09 DIAGNOSIS — I2510 Atherosclerotic heart disease of native coronary artery without angina pectoris: Secondary | ICD-10-CM

## 2013-09-09 DIAGNOSIS — I350 Nonrheumatic aortic (valve) stenosis: Secondary | ICD-10-CM

## 2013-09-09 NOTE — Progress Notes (Signed)
Echocardiogram performed.  

## 2013-09-14 ENCOUNTER — Encounter: Payer: Self-pay | Admitting: *Deleted

## 2013-09-14 ENCOUNTER — Telehealth: Payer: Self-pay | Admitting: *Deleted

## 2013-09-14 NOTE — Telephone Encounter (Signed)
RESULTS  LETTER MAILED  TO   PT./CY

## 2013-09-14 NOTE — Telephone Encounter (Signed)
Follow up          Pt returning nurses call

## 2013-09-14 NOTE — Telephone Encounter (Signed)
Cristina Parker H ','<More Detail >>       Josue Hector, MD       Sent: Mon September 14, 2013 8:14 AM    To: [place] Campbell, LPN                   Message     Essentially normal echo some diastolic dysfunction ( stiffness) to heart        ----- Message -----    From: [place] Campbell, LPN    Sent: 5/97/4163 7:43 AM    To: Josue Hector, MD        Not reviewed.                          Results 2D Echocardiogram without contrast (Order [id number])      2D Echocardiogram without contrast   Status: Edited Result - FINAL Visible to patient: This result is not viewable by the patient. Next appt: 09/16/2013 at 11:00 AM in Neuro-Rehabilitation Zigmund Daniel, Jackqulyn Livings, PT) Dx: AS (aortic stenosis)             Result Narrative      [facility] [address], [place] [address] [phone]  ------------------------------------------------------------ Transthoracic Echocardiography  (Report amended )  Patient: Cristina Parker, Cristina Parker MR #: [id number] Study Date: 09/09/2013 Gender: F Age: 78 Height: 147.3cm Weight: 51.7kg BSA: 1.64m2 Pt. Status: Room:  ATTENDING NLucille PassyREFERRING NJenkins RougeSONOGRAPHER VVictorio Palm RDCS PERFORMING [facility], Outpatient cc:  ------------------------------------------------------------ LV EF: 55% - 60%  ------------------------------------------------------------ Indications: Murmur 785.2.  ------------------------------------------------------------ History: PMH: Evaluate aortic valve. Acquired from the patient and from the patient's chart. Murmur. Atrial fibrillation. Cerebrovascular disease. Chronic obstructive pulmonary disease. Risk factors: Hypertension.  ------------------------------------------------------------ Study Conclusions  - Left ventricle: The cavity size was normal. Wall thickness was normal. Systolic function was normal. The  estimated ejection fraction was in the range of 55% to 60%. Wall motion was normal; there were no regional wall motion abnormalities. Features are consistent with a pseudonormal left ventricular filling pattern, with concomitant abnormal relaxation and increased filling pressure (grade 2 diastolic dysfunction). - Aortic valve: There was no stenosis. - Mitral valve: Mildly calcified annulus. Mildly calcified leaflets . - Left atrium: The atrium was mildly dilated. - Right ventricle: The cavity size was normal. Systolic function was normal. - Tricuspid valve: Peak RV-RA gradient: 3105mHg (S). - Pulmonary arteries: PA peak pressure: 3730mg (S). - Inferior vena cava: The vessel was normal in size; the respirophasic diameter changes were in the normal range (= 50%); findings are consistent with normal central venous pressure. Impressions:  - Normal LV size and systolic function, EF 55-50-38%oderate diastolic dysfunction. Normal RV size and systolic function. No significant valvular abnormalities. Left atrial enlargement. Mild pulmonary hypertension.  ------------------------------------------------------------ Labs, prior tests, procedures, and surgery: Echocardiography (July 2013). EF was 55% and PA pressure was 42 (systolic).  Transthoracic echocardiography. M-mode, complete 2D, spectral Doppler, and color Doppler. Height: Height: 147.3cm. Height: 58in. Weight: Weight: 51.7kg. Weight: 113.8lb. Body mass index: BMI: 23.8kg/m^2. Body surface area: BSA: 1.83m21mBlood pressure: 120/75. Patient status: Outpatient. Location: [place] [facility]  ------------------------------------------------------------  ------------------------------------------------------------ Left ventricle: The cavity size was normal. Wall thickness was normal. Systolic function was normal. The estimated ejection fraction was in the range of 55% to 60%. Wall motion was normal; there were no regional wall  motion  abnormalities. Features are consistent with a pseudonormal left ventricular filling pattern, with concomitant abnormal relaxation and increased filling pressure (grade 2 diastolic dysfunction).  ------------------------------------------------------------ Aortic valve: Trileaflet; mildly calcified leaflets. Doppler: There was no stenosis. No regurgitation.  ------------------------------------------------------------ Aorta: Aortic root: The aortic root was normal in size. Ascending aorta: The ascending aorta was normal in size.  ------------------------------------------------------------ Mitral valve: Mildly calcified annulus. Mildly calcified leaflets . Doppler: There was no evidence for stenosis.  ------------------------------------------------------------ Left atrium: The atrium was mildly dilated.  ------------------------------------------------------------ Right ventricle: The cavity size was normal. Systolic function was normal.  ------------------------------------------------------------ Pulmonic valve: Structurally normal valve. Cusp separation was normal. Doppler: Transvalvular velocity was within the normal range. No regurgitation.  ------------------------------------------------------------ Tricuspid valve: Doppler: Trivial regurgitation.  ------------------------------------------------------------ Right atrium: The atrium was normal in size.  ------------------------------------------------------------ Pericardium: There was no pericardial effusion.  ------------------------------------------------------------ Systemic veins: Inferior vena cava: The vessel was normal in size; the respirophasic diameter changes were in the normal range (= 50%); findings are consistent with normal central venous pressure.  ------------------------------------------------------------  2D measurements Normal Doppler measurements Normal Left ventricle Main  pulmonary LVID ED, 43 mm 43-52 artery chord, Pressure, S 37 mm Hg =30 PLAX Left ventricle LVID ES, 30 mm 23-38 Ea, lat 7.4 cm/s ------ chord, ann, tiss 6 PLAX DP FS, chord, 30 % >29 E/Ea, lat 8.9 ------ PLAX ann, tiss 3 LVPW, ED 10.6 mm ------ DP IVS/LVPW 0.68 <1.3 Ea, med 7.5 cm/s ------ ratio, ED ann, tiss 7 Ventricular septum DP IVS, ED 7.24 mm ------ E/Ea, med 8.8 ------ LVOT ann, tiss Diam, S 22 mm ------ DP Area 3.8 cm^2 ------ LVOT Diam 22 mm ------ Peak vel, S 66. cm/s ------ Aorta 2 Root diam, 28 mm ------ VTI, S 18. cm ------ ED 3 Left atrium Stroke vol 69. ml ------ AP dim 2.73 cm/m^2 <2.2 6 index Stroke 48. ml/m^2 ------ index 6 Mitral valve Peak E vel 66. cm/s ------ 6 Peak A vel 51. cm/s ------ 8 Deceleratio 211 ms 150-23 n time 0 Peak E/A 1.3 ------ ratio Tricuspid valve Regurg peak 290 cm/s ------ vel Peak RV-RA 34 mm Hg ------ gradient, S Systemic veins Estimated 3 mm Hg ------ CVP  ------------------------------------------------------------ Marcell Anger, [place] 2015-03-25T18:08:22.877         Specimen Collected: 09/09/13 12:08 PM Last Resulted: 09/09/13 6:08 PM                  Order Questions     Question Answer Comment    Where should this test be performed [facility])     Type of Echo Complete     Reason for Exam AS             Result Information     Status     Edited Result - FINAL (09/09/2013 6:08 PM)     Provider Status: Ordered    Encounter      View Encounter                Lab Information     [facility]               Order-Level Documents:     There are no order-level documents.         2D Echocardiogram without contrast (Order [id number]) Released By: Automatic Release User Date: 09/09/2013  Echocardiography Authorizing: Josue Hector, MD Department: [facility] CARDIOVASCULAR IMAGING ECHO [place]  Order: [id number]         Collier Salina  Tommy Rainwater, MD  NPI: [id number]         Order  Information     Order Date/Time Release Date/Time Start Date/Time End Date/Time    07/30/13 11:37 AM 09/09/13 11:17 AM 09/09/13 11:17 AM None           Order Details     Frequency Duration Priority Order Class    None None Routine Ancillary Performed           Order Questions     Question Answer Comment    Where should this test be performed [facility])     Type of Echo Complete     Reason for Exam AS            Order History Outpatient     Date/Time Action Taken User Additional Information    09/81/19 1478 Release Alveda Reasons From Order: [id number]    09/09/13 1807 Result Lab In Three Zero One Interface Final    09/09/13 1808 Result Lab In Three Zero One Interface Final-Edited           Associated Diagnoses     AS (aortic stenosis) [424.1] - Primary           Appointments for this Order     09/09/2013 11:30 AM - 60 min [facility]           Collection Information     Collected: 09/09/2013 12:08 PM Resulting Agency: [facility]          Verbal Order Info     Action Created on Order Mode Communicator Responsible Provider Signed by Signed on    Ordering 07/30/13 1137 Verbal with readback [place] Campbell, LPN Josue Hector, MD Josue Hector, MD 07/30/13 1644           Patient Information     Patient Name Sex DOB SSN    Cristina Parker, Cristina Parker Female November 30, 1935 [id number]           Additional Information     Associated Reports    View Parent Encounter    Priority and Order Details    Collection Information.           Order Provider Info       Office phone Pager/beeper E-mail    [place], LPN [phone] -- [email or web address]    Authorizing Provider Josue Hector, MD [phone] -- [email or web address]    Attending Provider Josue Hector, MD [phone] -- [email or web address]            Encounter     View Encounter              Order-Level Documents:     There are no order-level documents.         LMTCB    RESULTS  LETTER MAILED TO PT .Adonis Housekeeper

## 2013-09-16 ENCOUNTER — Ambulatory Visit: Payer: Medicare Other | Attending: Family Medicine | Admitting: Physical Therapy

## 2013-09-16 DIAGNOSIS — IMO0001 Reserved for inherently not codable concepts without codable children: Secondary | ICD-10-CM | POA: Insufficient documentation

## 2013-09-16 DIAGNOSIS — M6281 Muscle weakness (generalized): Secondary | ICD-10-CM | POA: Insufficient documentation

## 2013-09-16 DIAGNOSIS — R269 Unspecified abnormalities of gait and mobility: Secondary | ICD-10-CM | POA: Insufficient documentation

## 2013-09-23 ENCOUNTER — Ambulatory Visit: Payer: Medicare Other | Admitting: Physical Therapy

## 2013-09-25 ENCOUNTER — Ambulatory Visit: Payer: Medicare Other | Admitting: Physical Therapy

## 2013-09-25 ENCOUNTER — Telehealth: Payer: Self-pay | Admitting: *Deleted

## 2013-09-25 NOTE — Telephone Encounter (Signed)
Webb Silversmith H ','<More Detail >>       Josue Hector, MD       Sent: Thu September 24, 2013 10:29 PM    To: [place] Campbell, LPN                   Message     Normal EF no bad valves echo ok     ----- Message -----    From: Baxter Flattery    Sent: 09/24/2013 2:53 PM    To: Josue Hector, MD        echo report                          Results 2D Echocardiogram without contrast (Order [id number])      2D Echocardiogram without contrast   Status: Edited Result - FINAL Visible to patient: This result is not viewable by the patient. Next appt: 09/30/2013 at 08:00 AM in Neuro-Rehabilitation Alford Highland, Katina Dung, [place]) Dx: AS (aortic stenosis)             Result Narrative      [facility] [address], [place] [address] [phone]  ------------------------------------------------------------ Transthoracic Echocardiography  (Report amended )  Patient: Samanvitha, [place] MR #: [id number] Study Date: 09/09/2013 Gender: F Age: 55 Height: 147.3cm Weight: 51.7kg BSA: 1.79m2 Pt. Status: Room:  ATTENDING NLucille PassyREFERRING NJenkins RougeSONOGRAPHER VVictorio Palm RDCS PERFORMING [facility], Outpatient cc:  ------------------------------------------------------------ LV EF: 55% - 60%  ------------------------------------------------------------ Indications: Murmur 785.2.  ------------------------------------------------------------ History: PMH: Evaluate aortic valve. Acquired from the patient and from the patient's chart. Murmur. Atrial fibrillation. Cerebrovascular disease. Chronic obstructive pulmonary disease. Risk factors: Hypertension.  ------------------------------------------------------------ Study Conclusions  - Left ventricle: The cavity size was normal. Wall thickness was normal. Systolic function was normal. The estimated ejection fraction was in the range of 55%  to 60%. Wall motion was normal; there were no regional wall motion abnormalities. Features are consistent with a pseudonormal left ventricular filling pattern, with concomitant abnormal relaxation and increased filling pressure (grade 2 diastolic dysfunction). - Aortic valve: There was no stenosis. - Mitral valve: Mildly calcified annulus. Mildly calcified leaflets . - Left atrium: The atrium was mildly dilated. - Right ventricle: The cavity size was normal. Systolic function was normal. - Tricuspid valve: Peak RV-RA gradient: 340mHg (S). - Pulmonary arteries: PA peak pressure: 3760mg (S). - Inferior vena cava: The vessel was normal in size; the respirophasic diameter changes were in the normal range (= 50%); findings are consistent with normal central venous pressure. Impressions:  - Normal LV size and systolic function, EF 55-09-14%oderate diastolic dysfunction. Normal RV size and systolic function. No significant valvular abnormalities. Left atrial enlargement. Mild pulmonary hypertension.  ------------------------------------------------------------ Labs, prior tests, procedures, and surgery: Echocardiography (July 2013). EF was 55% and PA pressure was 42 (systolic).  Transthoracic echocardiography. M-mode, complete 2D, spectral Doppler, and color Doppler. Height: Height: 147.3cm. Height: 58in. Weight: Weight: 51.7kg. Weight: 113.8lb. Body mass index: BMI: 23.8kg/m^2. Body surface area: BSA: 1.67m19mBlood pressure: 120/75. Patient status: Outpatient. Location: [place] [facility]  ------------------------------------------------------------  ------------------------------------------------------------ Left ventricle: The cavity size was normal. Wall thickness was normal. Systolic function was normal. The estimated ejection fraction was in the range of 55% to 60%. Wall motion was normal; there were no regional wall motion abnormalities. Features are consistent with a  pseudonormal  left ventricular filling pattern, with concomitant abnormal relaxation and increased filling pressure (grade 2 diastolic dysfunction).  ------------------------------------------------------------ Aortic valve: Trileaflet; mildly calcified leaflets. Doppler: There was no stenosis. No regurgitation.  ------------------------------------------------------------ Aorta: Aortic root: The aortic root was normal in size. Ascending aorta: The ascending aorta was normal in size.  ------------------------------------------------------------ Mitral valve: Mildly calcified annulus. Mildly calcified leaflets . Doppler: There was no evidence for stenosis.  ------------------------------------------------------------ Left atrium: The atrium was mildly dilated.  ------------------------------------------------------------ Right ventricle: The cavity size was normal. Systolic function was normal.  ------------------------------------------------------------ Pulmonic valve: Structurally normal valve. Cusp separation was normal. Doppler: Transvalvular velocity was within the normal range. No regurgitation.  ------------------------------------------------------------ Tricuspid valve: Doppler: Trivial regurgitation.  ------------------------------------------------------------ Right atrium: The atrium was normal in size.  ------------------------------------------------------------ Pericardium: There was no pericardial effusion.  ------------------------------------------------------------ Systemic veins: Inferior vena cava: The vessel was normal in size; the respirophasic diameter changes were in the normal range (= 50%); findings are consistent with normal central venous pressure.  ------------------------------------------------------------  2D measurements Normal Doppler measurements Normal Left ventricle Main pulmonary LVID ED, 43 mm 43-52 artery chord, Pressure, S 37  mm Hg =30 PLAX Left ventricle LVID ES, 30 mm 23-38 Ea, lat 7.4 cm/s ------ chord, ann, tiss 6 PLAX DP FS, chord, 30 % >29 E/Ea, lat 8.9 ------ PLAX ann, tiss 3 LVPW, ED 10.6 mm ------ DP IVS/LVPW 0.68 <1.3 Ea, med 7.5 cm/s ------ ratio, ED ann, tiss 7 Ventricular septum DP IVS, ED 7.24 mm ------ E/Ea, med 8.8 ------ LVOT ann, tiss Diam, S 22 mm ------ DP Area 3.8 cm^2 ------ LVOT Diam 22 mm ------ Peak vel, S 66. cm/s ------ Aorta 2 Root diam, 28 mm ------ VTI, S 18. cm ------ ED 3 Left atrium Stroke vol 69. ml ------ AP dim 2.73 cm/m^2 <2.2 6 index Stroke 48. ml/m^2 ------ index 6 Mitral valve Peak E vel 66. cm/s ------ 6 Peak A vel 51. cm/s ------ 8 Deceleratio 211 ms 150-23 n time 0 Peak E/A 1.3 ------ ratio Tricuspid valve Regurg peak 290 cm/s ------ vel Peak RV-RA 34 mm Hg ------ gradient, S Systemic veins Estimated 3 mm Hg ------ CVP  ------------------------------------------------------------ Marcell Anger, [place] 2015-03-25T18:08:22.877         Specimen Collected: 09/09/13 12:08 PM Last Resulted: 09/09/13 6:08 PM                  Order Questions     Question Answer Comment    Where should this test be performed [facility])     Type of Echo Complete     Reason for Exam AS             Result Information     Status     Edited Result - FINAL (09/09/2013 6:08 PM)     Provider Status: Ordered    Encounter      View Encounter                Lab Information     [facility]               Order-Level Documents:     There are no order-level documents.         2D Echocardiogram without contrast (Order [id number]) Released By: Automatic Release User Date: 09/09/2013  Echocardiography Authorizing: Josue Hector, MD Department: [facility] CARDIOVASCULAR IMAGING ECHO [address]  Order: [id number]         Josue Hector, MD  NPI: [id number]  Order Information     Order Date/Time Release Date/Time Start  Date/Time End Date/Time    07/30/13 11:37 AM 09/09/13 11:17 AM 09/09/13 11:17 AM None           Order Details     Frequency Duration Priority Order Class    None None Routine Ancillary Performed           Order Questions     Question Answer Comment    Where should this test be performed [facility])     Type of Echo Complete     Reason for Exam AS            Order History Outpatient     Date/Time Action Taken User Additional Information    20/60/15 6153 Release Alveda Reasons From Order: [id number]    09/09/13 1807 Result Lab in Three Zero One Interface Final    09/09/13 1808 Result Lab in Three Zero One Interface Final-Edited           Associated Diagnoses     AS (aortic stenosis) [424.1] - Primary           Appointments for this Order     09/09/2013 11:30 AM - 60 min [facility] Echo Lab           Collection Information     Collected: 09/09/2013 12:08 PM Resulting Agency: [facility]          Verbal Order Info     Action Created on Order Mode Communicator Responsible Provider Signed by Signed on    Ordering 07/30/13 1137 Verbal with readback [place] Campbell, LPN Josue Hector, MD Josue Hector, MD 07/30/13 1644           Patient Information     Patient Name Sex DOB SSN    Shambhavi, Salley Female 02-16-1936 [id number]           Additional Information     Associated Reports    View Parent Encounter    Priority and Order Details    Collection Information.           Order Provider Info       Office phone Pager/beeper E-mail    [place], LPN [phone] -- [email or web address]    Authorizing Provider Josue Hector, MD [phone] -- [email or web address]    Attending Provider Josue Hector, MD [phone] -- [email or web address]            Encounter     View Encounter            Order-Level Documents:     There are no order-level  documents.         PT  AWARE OF ECHO RESULT S./CY

## 2013-09-29 ENCOUNTER — Other Ambulatory Visit: Payer: Self-pay | Admitting: Family Medicine

## 2013-09-30 ENCOUNTER — Ambulatory Visit: Payer: Medicare Other | Admitting: Physical Therapy

## 2013-10-01 ENCOUNTER — Ambulatory Visit (INDEPENDENT_AMBULATORY_CARE_PROVIDER_SITE_OTHER): Payer: Medicare Other | Admitting: Family Medicine

## 2013-10-01 ENCOUNTER — Encounter: Payer: Self-pay | Admitting: Family Medicine

## 2013-10-01 VITALS — BP 127/72 | HR 67 | Temp 97.6°F | Resp 18 | Ht 59.0 in | Wt 113.0 lb

## 2013-10-01 DIAGNOSIS — Z0000 Encounter for general adult medical examination without abnormal findings: Secondary | ICD-10-CM

## 2013-10-01 DIAGNOSIS — H8110 Benign paroxysmal vertigo, unspecified ear: Secondary | ICD-10-CM

## 2013-10-01 DIAGNOSIS — I10 Essential (primary) hypertension: Secondary | ICD-10-CM

## 2013-10-01 DIAGNOSIS — F32A Depression, unspecified: Secondary | ICD-10-CM

## 2013-10-01 DIAGNOSIS — Z1211 Encounter for screening for malignant neoplasm of colon: Secondary | ICD-10-CM

## 2013-10-01 DIAGNOSIS — F3289 Other specified depressive episodes: Secondary | ICD-10-CM

## 2013-10-01 DIAGNOSIS — D72829 Elevated white blood cell count, unspecified: Secondary | ICD-10-CM

## 2013-10-01 DIAGNOSIS — F329 Major depressive disorder, single episode, unspecified: Secondary | ICD-10-CM

## 2013-10-01 LAB — CBC WITH DIFFERENTIAL/PLATELET
BASOS ABS: 0 10*3/uL (ref 0.0–0.1)
Basophils Relative: 0 % (ref 0–1)
Eosinophils Absolute: 0.4 10*3/uL (ref 0.0–0.7)
Eosinophils Relative: 5 % (ref 0–5)
HEMATOCRIT: 36.9 % (ref 36.0–46.0)
HEMOGLOBIN: 12.4 g/dL (ref 12.0–15.0)
Lymphocytes Relative: 18 % (ref 12–46)
Lymphs Abs: 1.5 10*3/uL (ref 0.7–4.0)
MCH: 31.5 pg (ref 26.0–34.0)
MCHC: 33.6 g/dL (ref 30.0–36.0)
MCV: 93.7 fL (ref 78.0–100.0)
MONO ABS: 0.4 10*3/uL (ref 0.1–1.0)
MONOS PCT: 5 % (ref 3–12)
NEUTROS ABS: 5.9 10*3/uL (ref 1.7–7.7)
Neutrophils Relative %: 72 % (ref 43–77)
Platelets: 243 10*3/uL (ref 150–400)
RBC: 3.94 MIL/uL (ref 3.87–5.11)
RDW: 13.5 % (ref 11.5–15.5)
WBC: 8.2 10*3/uL (ref 4.0–10.5)

## 2013-10-01 LAB — COMPLETE METABOLIC PANEL WITH GFR
ALBUMIN: 4.2 g/dL (ref 3.5–5.2)
ALT: 12 U/L (ref 0–35)
AST: 19 U/L (ref 0–37)
Alkaline Phosphatase: 103 U/L (ref 39–117)
BUN: 16 mg/dL (ref 6–23)
CALCIUM: 9.6 mg/dL (ref 8.4–10.5)
CO2: 26 mEq/L (ref 19–32)
Chloride: 101 mEq/L (ref 96–112)
Creat: 0.81 mg/dL (ref 0.50–1.10)
GFR, Est African American: 82 mL/min
GFR, Est Non African American: 71 mL/min
GLUCOSE: 83 mg/dL (ref 70–99)
POTASSIUM: 4.5 meq/L (ref 3.5–5.3)
Sodium: 138 mEq/L (ref 135–145)
Total Bilirubin: 0.5 mg/dL (ref 0.2–1.2)
Total Protein: 7.1 g/dL (ref 6.0–8.3)

## 2013-10-01 LAB — IFOBT (OCCULT BLOOD): IMMUNOLOGICAL FECAL OCCULT BLOOD TEST: POSITIVE

## 2013-10-01 MED ORDER — ESCITALOPRAM OXALATE 20 MG PO TABS: 20.0000 mg | ORAL_TABLET | Freq: Every day | ORAL | Status: DC

## 2013-10-01 MED ORDER — CLOPIDOGREL BISULFATE 75 MG PO TABS: 75.0000 mg | ORAL_TABLET | Freq: Every day | ORAL | Status: DC

## 2013-10-01 MED ORDER — METOPROLOL SUCCINATE ER 50 MG PO TB24: 25.0000 mg | ORAL_TABLET | Freq: Every day | ORAL | Status: DC

## 2013-10-01 MED ORDER — HYDROCODONE-ACETAMINOPHEN 10-325 MG PO TABS: ORAL_TABLET | ORAL | Status: DC

## 2013-10-01 MED ORDER — ATORVASTATIN CALCIUM 40 MG PO TABS: 40.0000 mg | ORAL_TABLET | Freq: Every day | ORAL | Status: DC

## 2013-10-01 NOTE — Patient Instructions (Signed)
Keeping You Healthy  Get These Tests  Blood Pressure- Have your blood pressure checked by your healthcare provider at least once a year.  Normal blood pressure is 120/80.  Weight- Have your body mass index (BMI) calculated to screen for obesity.  BMI is a measure of body fat based on height and weight.  You can calculate your own BMI at [employer]velB[employer].it  Cholesterol- Have your cholesterol checked every year.  Diabetes- Have your blood sugar checked every year if you have high blood pressure, high cholesterol, a family history of diabetes or if you are overweight.  Pap Smear- Have a pap smear every 1 to 3 years if you have been sexually active.  If you are older than 65 and recent pap smears have been normal you may not need additional pap smears.  In addition, if you have had a hysterectomy  For benign disease additional pap smears are not necessary.  Mammogram-Yearly mammograms are essential for early detection of breast cancer  Screening for Colon Cancer- Colonoscopy starting at age 74. Screening may begin sooner depending on your family history and other health conditions.  Follow up colonoscopy as directed by your Gastroenterologist.  Screening for Osteoporosis- Screening begins at age 28 with bone density scanning, sooner if you are at higher risk for developing Osteoporosis.  Get these medicines  Calcium with Vitamin D- Your body requires 1200-1500 mg of Calcium a day and [phone] IU of Vitamin D a day.  You can only absorb 500 mg of Calcium at a time therefore Calcium must be taken in 2 or 3 separate doses throughout the day.  Hormones- Hormone therapy has been associated with increased risk for certain cancers and heart disease.  Talk to your healthcare provider about if you need relief from menopausal symptoms.  Aspirin- Ask your healthcare provider about taking Aspirin to prevent Heart Disease and Stroke.  Get these Immunizations- ALL your immunizations are  current.  Flu shot- Every fall  Pneumonia shot- Once after the age of 17; if you are younger ask your healthcare provider if you need a pneumonia shot.  Tetanus- Every ten years.  Zostavax- Once after the age of 80 to prevent shingles.  Take these steps  Don't smoke- Your healthcare provider can help you quit. For tips on how to quit, ask your healthcare provider or go to www.smokefree.gov or call 1-800 QUIT-NOW.  Be physically active- Exercise 5 days a week for a minimum of 30 minutes.  If you are not already physically active, start slow and gradually work up to 30 minutes of moderate physical activity.  Try walking, dancing, bike riding, swimming, etc.  Eat a healthy diet- Eat a variety of healthy foods such as fruits, vegetables, whole grains, low fat milk, low fat cheeses, yogurt, lean meats, chicken, fish, eggs, dried beans, tofu, etc.  For more information go to www.thenutritionsource.org  Dental visit- Brush and floss teeth twice daily; visit your dentist twice a year.  Eye exam- Visit your Optometrist or Ophthalmologist yearly.  Drink alcohol in moderation- Limit alcohol intake to one drink or less a day.  Never drink and drive.  Depression- Your emotional health is as important as your physical health.  If you're feeling down or losing interest in things you normally enjoy, please talk to your healthcare provider.  Seat Belts- can save your life; always wear one  Smoke/Carbon Monoxide detectors- These detectors need to be installed on the appropriate level of your home.  Replace batteries at least once a  year.  Violence- If anyone is threatening or hurting you, please tell your healthcare provider.  Living Will/ Health care power of attorney- Discuss with your healthcare provider and family.

## 2013-10-01 NOTE — Progress Notes (Signed)
Subjective:    Patient ID: Cristina Parker, female    DOB: [birth year], 78 y.o.   MRN: [id number]  HPI This 78 y.o. Cauc female is here for [facility] Subsequent Annual CPE. Ongoing medical problems include HTN, CAD w/ ventricular arrhythmia and occlusive cerebrovascular disease, followed by Drs. Jenkins Rouge and Curt Jews.  HCM: [facility]- 2013 (negative)           ECG- Per Card/VVS           CRS- Current (2012)                IMM- Current.   Patient Active Problem List   Diagnosis Date Noted  . Cataract 09/25/2012  . Arthritis 09/25/2012  . Depression 09/25/2012  . Osteoporosis, unspecified 09/25/2012  . Duodenal stricture 09/16/2012  . Murmur 12/19/2011  . Occlusion and stenosis of carotid artery without mention of cerebral infarction 11/15/2011  . Paroxysmal a-fib 07/09/2011  . COPD (chronic obstructive pulmonary disease) 07/03/2011  . OTHER ULCERATIVE COLITIS 08/09/2009  . PVD 10/31/2007  . HIATAL HERNIA 10/31/2007  . Other and unspecified hyperlipidemia 07/18/2007  . HYPERTENSION 07/18/2007  . CORONARY HEART DISEASE 07/18/2007  . VENTRICULAR ARRHYTHMIA 07/18/2007  . LUNG NODULE 07/18/2007  . G E R D 07/18/2007  . CAROTID ENDARTERECTOMY, LEFT, HX OF 07/18/2007   Prior to Admission medications   Medication Sig Start Date End Date Taking? Authorizing Provider  aspirin 81 MG tablet Take 81 mg by mouth every morning.    Yes Historical Provider, MD  atorvastatin (LIPITOR) 40 MG tablet Take 1 tablet (40 mg total) by mouth daily.   Yes Barton Fanny, MD  Calcium Citrate-Vitamin D 250-200 MG-UNIT TABS Take 2 tablets by mouth daily.     Yes Historical Provider, MD  clopidogrel (PLAVIX) 75 MG tablet Take 1 tablet (75 mg total) by mouth at bedtime.   Yes Barton Fanny, MD  escitalopram (LEXAPRO) 20 MG tablet Take 1 tablet (20 mg total) by mouth daily.   Yes Barton Fanny, MD  HYDROcodone-acetaminophen [facility]) 10-325 MG per tablet Take 1/2 tablet every 8-12 hours as needed for  pain. 08/28/13  Yes Barton Fanny, MD  LORazepam (ATIVAN) 0.5 MG tablet Take 0.5 tablets (0.25 mg total) by mouth 2 (two) times daily as needed for anxiety. 07/09/13  Yes Barton Fanny, MD  metoprolol succinate (TOPROL-XL) 50 MG 24 hr tablet Take 1 tablet (50 mg total) by mouth daily. with or immediately following a meal.   Yes Barton Fanny, MD  NEXIUM 40 MG capsule Take 1 capsule (40 mg total) by mouth daily before breakfast. 12/02/12  Yes Sable Feil, MD  lisinopril-hydrochlorothiazide (PRINZIDE,ZESTORETIC) 10-12.5 MG per tablet Take 1 tablet by mouth daily. 06/19/12   Barton Fanny, MD  Misc. Devices (HUGO ROLLING WALKER ELITE) MISC 1 Device by Does not apply route daily. 07/16/13   Barton Fanny, MD   PMHx, Surg Hx, Soc and Fam Hx reviewed. Pt and son reside together; grandson also in the home.  Review of Systems  Constitutional: Positive for fatigue.  HENT: Negative.   Eyes: Positive for visual disturbance.       R eye cataract- mature; pt to schedule OV w/ Ophthalmologist.  Respiratory: Negative.   Cardiovascular: Negative.   Gastrointestinal: Negative.   Musculoskeletal: Positive for arthralgias, back pain and gait problem. Negative for myalgias.  Skin: Negative.   Neurological: Positive for dizziness. Negative for syncope, speech difficulty, weakness, light-headedness, numbness and headaches.  Psychiatric/Behavioral:       BECK DEPRESSION SURVEY score= 9 (normal ups & downs); pt taking Escitalopram.      Objective:   Physical Exam  Nursing note and vitals reviewed. Constitutional: She is oriented to person, place, and time. Vital signs are normal. She appears well-developed and well-nourished. No distress.  HENT:  Head: Normocephalic and atraumatic.  Right Ear: Hearing, external ear and ear canal normal. Tympanic membrane is scarred.  Left Ear: Hearing, external ear and ear canal normal. Tympanic membrane is scarred.  Nose: Nose normal. No nasal  deformity or septal deviation.  Mouth/Throat: Uvula is midline, oropharynx is clear and moist and mucous membranes are normal. No oral lesions.  Eyes: Conjunctivae, EOM and lids are normal. Pupils are equal, round, and reactive to light. No scleral icterus.  R eye- cataract/ cloudy cornea.  Neck: Trachea normal. Neck supple. No JVD present. No spinous process tenderness and no muscular tenderness present. Decreased range of motion present. No mass and no thyromegaly present.  Carotids- no bruits appreciated; pt has hx of significant occlusive disease.  Cardiovascular: Normal rate, regular rhythm, S1 normal and S2 normal.   No extrasystoles are present. PMI is not displaced.  Exam reveals no gallop and no friction rub.   Murmur heard. Pulses:      Femoral pulses are 1+ on the right side, and 1+ on the left side.      Dorsalis pedis pulses are 1+ on the right side, and 1+ on the left side.       Posterior tibial pulses are 1+ on the right side, and 1+ on the left side.  See detailed cardiology exam 07/30/13.  Pulmonary/Chest: Effort normal. No respiratory distress. She has decreased breath sounds. Right breast exhibits no inverted nipple, no mass, no nipple discharge, no skin change and no tenderness. Left breast exhibits no inverted nipple, no mass, no nipple discharge, no skin change and no tenderness. Breasts are symmetrical.  Decreased BS at bases.  Abdominal: Soft. Normal appearance and bowel sounds are normal. She exhibits no distension, no abdominal bruit, no pulsatile midline mass and no mass. There is no hepatosplenomegaly. There is no tenderness. There is no guarding and no CVA tenderness. No hernia.  Genitourinary: Rectal exam shows internal hemorrhoid and tenderness. Rectal exam shows no external hemorrhoid, no fissure, no mass and anal tone normal. Guaiac positive stool.  Musculoskeletal:       Cervical back: She exhibits decreased range of motion, tenderness and spasm. She exhibits no  deformity and no pain.       Thoracic back: She exhibits decreased range of motion, tenderness and spasm. She exhibits no bony tenderness, no deformity and no pain.       Lumbar back: She exhibits decreased range of motion, tenderness and spasm. She exhibits no bony tenderness, no deformity and no pain.  Degenerative changes in major joints; Heberden's nodes in hands/digits. Decreased ROM in shoulders, wrists, hands, knees, ankles and feet.  Lymphadenopathy:       Head (right side): No submental, no submandibular, no tonsillar, no posterior auricular and no occipital adenopathy present.       Head (left side): No submental, no submandibular, no tonsillar, no posterior auricular and no occipital adenopathy present.    She has no cervical adenopathy.    She has no axillary adenopathy.       Right: No inguinal, no supraclavicular and no epitrochlear adenopathy present.       Left: No inguinal, no supraclavicular  and no epitrochlear adenopathy present.  Neurological: She is alert and oriented to person, place, and time. She has normal strength. She displays no atrophy and no tremor. No cranial nerve deficit or sensory deficit. She exhibits normal muscle tone. She displays a negative Romberg sign. Gait abnormal. Coordination normal. She displays no Babinski's sign on the right side. She displays no Babinski's sign on the left side.  Reflex Scores:      Tricep reflexes are 1+ on the right side and 1+ on the left side.      Bicep reflexes are 2+ on the right side and 2+ on the left side.      Brachioradialis reflexes are 1+ on the right side and 1+ on the left side.      Patellar reflexes are 2+ on the right side and 2+ on the left side. Antalgic gait; 3-point turn is stable.  Skin: Skin is warm, dry and intact. No ecchymosis, no lesion and no rash noted. She is not diaphoretic. No cyanosis or erythema. No pallor. Nails show no clubbing.  Psychiatric: She has a normal mood and affect. Her speech is  normal and behavior is normal. Judgment and thought content normal. Cognition and memory are normal.       Assessment & Plan:  Routine general medical examination at a health care facility - Plan: IFOBT POC (occult bld, rslt in office), COMPLETE METABOLIC PANEL WITH GFR  HYPERTENSION - Stable and controlled; follow-up w/ Cardiovascular specialists as scheduled.    Plan: COMPLETE METABOLIC PANEL WITH GFR, CBC with Differential  Depression- Stable on Escitalopram 20 mg daily.  Benign paroxysmal positional vertigo - Continue PT.  Plan: COMPLETE METABOLIC PANEL WITH GFR, CBC with Differential  Leucocytosis - Plan: CBC with Differential, CBC with Differential  Screening for colon cancer - Hemosure + in office; pt given test for collection of specimen at home.        Plan: IFOBT POC (occult bld, rslt in office)  Meds ordered this encounter  Medications  . metoprolol succinate (TOPROL-XL) 50 MG 24 hr tablet    Sig: Take 1/2 tablet (25 mg total) by mouth daily. with or immediately following a meal.    Dispense:  30 tablet    Refill:  5  . escitalopram (LEXAPRO) 20 MG tablet    Sig: Take 1 tablet (20 mg total) by mouth daily.    Dispense:  90 tablet    Refill:  3  . clopidogrel (PLAVIX) 75 MG tablet    Sig: Take 1 tablet (75 mg total) by mouth at bedtime.    Dispense:  90 tablet    Refill:  3  . atorvastatin (LIPITOR) 40 MG tablet    Sig: Take 1 tablet (40 mg total) by mouth daily.    Dispense:  90 tablet    Refill:  3  . HYDROcodone-acetaminophen (NORCO) 10-325 MG per tablet    Sig: Take 1/2 tablet every 8-12 hours as needed for pain.    Dispense:  30 tablet    Refill:  0

## 2013-10-01 NOTE — Progress Notes (Deleted)
   Subjective:    Patient ID: Cristina Parker, female    DOB: Feb 11, 1936, 78 y.o.   MRN: [id number]  HPI    Patient Active Problem List   Diagnosis Date Noted  . Cataract 09/25/2012  . Arthritis 09/25/2012  . Depression 09/25/2012  . Osteoporosis, unspecified 09/25/2012  . Duodenal stricture 09/16/2012  . Murmur 12/19/2011  . Occlusion and stenosis of carotid artery without mention of cerebral infarction 11/15/2011  . Paroxysmal a-fib 07/09/2011  . COPD (chronic obstructive pulmonary disease) 07/03/2011  . OTHER ULCERATIVE COLITIS 08/09/2009  . PVD 10/31/2007  . HIATAL HERNIA 10/31/2007  . Other and unspecified hyperlipidemia 07/18/2007  . HYPERTENSION 07/18/2007  . CORONARY HEART DISEASE 07/18/2007  . VENTRICULAR ARRHYTHMIA 07/18/2007  . LUNG NODULE 07/18/2007  . G E R D 07/18/2007  . CAROTID ENDARTERECTOMY, LEFT, HX OF 07/18/2007       Prior to Admission medications   Medication Sig Start Date End Date Taking? Authorizing Provider  aspirin 81 MG tablet Take 81 mg by mouth every morning.    Yes Historical Provider, MD  atorvastatin (LIPITOR) 40 MG tablet Take 1 tablet (40 mg total) by mouth daily. PATIENT NEEDS OFFICE VISIT/LABS FOR ADDITIONAL REFILLS   Yes Barton Fanny, MD  Calcium Citrate-Vitamin D 250-200 MG-UNIT TABS Take 2 tablets by mouth daily.     Yes Historical Provider, MD  clopidogrel (PLAVIX) 75 MG tablet Take 75 mg by mouth at bedtime.   Yes Historical Provider, MD  escitalopram (LEXAPRO) 20 MG tablet Take 1 tablet (20 mg total) by mouth daily. 04/02/13  Yes Barton Fanny, MD  HYDROcodone-acetaminophen [facility]) 10-325 MG per tablet Take 1/2 tablet every 8-12 hours as needed for pain. 08/28/13  Yes Barton Fanny, MD  LORazepam (ATIVAN) 0.5 MG tablet Take 0.5 tablets (0.25 mg total) by mouth 2 (two) times daily as needed for anxiety. 07/09/13  Yes Barton Fanny, MD  metoprolol succinate (TOPROL-XL) 50 MG 24 hr tablet Take 25 mg by mouth  daily. with or immediately following a meal.   Yes Historical Provider, MD  NEXIUM 40 MG capsule Take 1 capsule (40 mg total) by mouth daily before breakfast. 12/02/12  Yes Sable Feil, MD  lisinopril-hydrochlorothiazide (PRINZIDE,ZESTORETIC) 10-12.5 MG per tablet Take 1 tablet by mouth daily. 06/19/12   Barton Fanny, MD  Misc. Devices (HUGO ROLLING WALKER ELITE) MISC 1 Device by Does not apply route daily. 07/16/13   Barton Fanny, MD     Review of Systems     Objective:   Physical Exam        Assessment & Plan:

## 2013-10-02 ENCOUNTER — Ambulatory Visit: Payer: Medicare Other | Admitting: Physical Therapy

## 2013-10-02 NOTE — Progress Notes (Signed)
Quick Note:  Please notify pt that results are normal.   Provide pt with copy of labs. ______ 

## 2013-10-07 ENCOUNTER — Ambulatory Visit: Payer: Medicare Other | Admitting: Physical Therapy

## 2013-10-09 ENCOUNTER — Ambulatory Visit: Payer: Medicare Other | Admitting: Physical Therapy

## 2013-10-12 LAB — IFOBT (OCCULT BLOOD): IMMUNOLOGICAL FECAL OCCULT BLOOD TEST: POSITIVE

## 2013-10-14 ENCOUNTER — Ambulatory Visit: Payer: Medicare Other | Admitting: Physical Therapy

## 2013-10-14 NOTE — Progress Notes (Signed)
Quick Note:  Please advise pt regarding following labs... The stool card brought in from home tested positive also. I would like for you to come in at your earliest convenience so we can discuss this result and further evaluation. ______

## 2013-10-16 ENCOUNTER — Ambulatory Visit: Payer: Medicare Other | Attending: Family Medicine | Admitting: Physical Therapy

## 2013-10-16 DIAGNOSIS — IMO0001 Reserved for inherently not codable concepts without codable children: Secondary | ICD-10-CM | POA: Insufficient documentation

## 2013-10-16 DIAGNOSIS — M6281 Muscle weakness (generalized): Secondary | ICD-10-CM | POA: Insufficient documentation

## 2013-10-16 DIAGNOSIS — R269 Unspecified abnormalities of gait and mobility: Secondary | ICD-10-CM | POA: Insufficient documentation

## 2013-10-28 ENCOUNTER — Telehealth: Payer: Self-pay

## 2013-10-28 MED ORDER — HYDROCODONE-ACETAMINOPHEN 10-325 MG PO TABS: ORAL_TABLET | ORAL | Status: DC

## 2013-10-28 NOTE — Telephone Encounter (Signed)
LMOM that rx is ready for p/u

## 2013-10-28 NOTE — Telephone Encounter (Signed)
Hydrocodone- APAP 10-325 MG prescription printed at [facility]. Pt will be able to pick up RX from [facility]. Signature required.

## 2013-10-28 NOTE — Telephone Encounter (Signed)
Refill  HYDROcodone-acetaminophen (NORCO) 10-325 MG per tablet   [phone]

## 2013-11-05 ENCOUNTER — Telehealth: Payer: Self-pay

## 2013-11-05 MED ORDER — LORAZEPAM 0.5 MG PO TABS: 0.2500 mg | ORAL_TABLET | Freq: Two times a day (BID) | ORAL | Status: DC | PRN

## 2013-11-05 NOTE — Telephone Encounter (Signed)
Lorazepam refill phoned to [place].

## 2013-11-05 NOTE — Telephone Encounter (Signed)
Pharm reqs RF of lorazepam

## 2013-11-06 ENCOUNTER — Encounter: Payer: Self-pay | Admitting: Family Medicine

## 2013-11-06 ENCOUNTER — Ambulatory Visit (INDEPENDENT_AMBULATORY_CARE_PROVIDER_SITE_OTHER): Payer: Medicare Other | Admitting: Family Medicine

## 2013-11-06 VITALS — BP 120/78 | HR 72 | Temp 97.9°F | Resp 16 | Ht 59.5 in | Wt 113.4 lb

## 2013-11-06 DIAGNOSIS — R195 Other fecal abnormalities: Secondary | ICD-10-CM

## 2013-11-06 LAB — CBC WITH DIFFERENTIAL/PLATELET
BASOS ABS: 0 10*3/uL (ref 0.0–0.1)
BASOS PCT: 0 % (ref 0–1)
EOS ABS: 0.4 10*3/uL (ref 0.0–0.7)
EOS PCT: 4 % (ref 0–5)
HEMATOCRIT: 38.3 % (ref 36.0–46.0)
Hemoglobin: 13 g/dL (ref 12.0–15.0)
Lymphocytes Relative: 14 % (ref 12–46)
Lymphs Abs: 1.3 10*3/uL (ref 0.7–4.0)
MCH: 31.1 pg (ref 26.0–34.0)
MCHC: 33.9 g/dL (ref 30.0–36.0)
MCV: 91.6 fL (ref 78.0–100.0)
MONO ABS: 0.5 10*3/uL (ref 0.1–1.0)
Monocytes Relative: 5 % (ref 3–12)
Neutro Abs: 7.2 10*3/uL (ref 1.7–7.7)
Neutrophils Relative %: 77 % (ref 43–77)
Platelets: 225 10*3/uL (ref 150–400)
RBC: 4.18 MIL/uL (ref 3.87–5.11)
RDW: 13.3 % (ref 11.5–15.5)
WBC: 9.3 10*3/uL (ref 4.0–10.5)

## 2013-11-06 LAB — IBC PANEL
%SAT: 18 % — ABNORMAL LOW (ref 20–55)
TIBC: 365 ug/dL (ref 250–470)
UIBC: 298 ug/dL (ref 125–400)

## 2013-11-06 LAB — IRON: Iron: 67 ug/dL (ref 42–145)

## 2013-11-06 LAB — FERRITIN: Ferritin: 55 ng/mL (ref 10–291)

## 2013-11-06 NOTE — Progress Notes (Signed)
Subjective:    Patient ID: Cristina Parker, female    DOB: September 07, 1935, 78 y.o.   MRN: [id number] Chief Complaint  Patient presents with  . Follow-up    positive hemmocult    HPI   Had colonoscopy in 2012 by Dr. Sharlett Parker - no polyps found. She did have ulcerative colitis vs Chrons disease (thinks it was never definitievly diagnosed which type of IBD) and she did have to take medicine for a bleeding ulcer.  Pt was told to repeat her colonoscopy in 10 yrs. She uses imodium as needed for loose stools (will start taking if >3 episodes).   Now pt is hard stools and constipation at time for years. She does occasioucly have what looks like fresh blood on the toilet paper after wiping.  She has stool softeners but never ever uses them as she is so afraid of having loose stools.  She does have hemorrhoids and noted that when she did do the last hemosure test that the stool was hard though no gross blood was seen at that time.  She was taking iron supplements but stopped about a month ago.   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 Has had several duodenal dilatations.  Taking nexium, plavix, and asa  Past Medical History  Diagnosis Date  . Heart disease   . Crohn's disease   . Duodenal stricture   . Esophageal reflux   . Hypertension   . Hyperlipemia   . Hiatal hernia   . Pneumonia   . Anemia   . Depression   . Collagenous colitis   . Anxiety   . Paroxysmal a-fib 07/09/2011  . Peripheral vascular disease   . Blood transfusion without reported diagnosis   . Duodenal ulcer    Current Outpatient Prescriptions on File Prior to Visit  Medication Sig Dispense Refill  . aspirin 81 MG tablet Take 81 mg by mouth every morning.       Marland Kitchen atorvastatin (LIPITOR) 40 MG tablet Take 1 tablet (40 mg total) by mouth daily.  90 tablet  3  . Calcium  Citrate-Vitamin D 250-200 MG-UNIT TABS Take 2 tablets by mouth daily.        . clopidogrel (PLAVIX) 75 MG tablet Take 1 tablet (75 mg total) by mouth at bedtime.  90 tablet  3  . escitalopram (LEXAPRO) 20 MG tablet Take 1 tablet (20 mg total) by mouth daily.  90 tablet  3  . LORazepam (ATIVAN) 0.5 MG tablet Take 0.5 tablets (0.25 mg total) by mouth 2 (two) times daily as needed for anxiety.  30 tablet  1  . metoprolol succinate (TOPROL-XL) 50 MG 24 hr tablet Take 1 tablet (50 mg total) by mouth daily. with or immediately following a meal.  30 tablet  5  . Misc. Devices (HUGO ROLLING WALKER ELITE) MISC 1 Device by Does not apply route daily.  1 each  0  . NEXIUM 40 MG capsule Take 1 capsule (40 mg total) by mouth daily before breakfast.  30 capsule  11  . lisinopril-hydrochlorothiazide (PRINZIDE,ZESTORETIC) 10-12.5 MG per tablet Take 1 tablet by mouth daily.  90 tablet  3   No current facility-administered medications on file prior to visit.   No Known Allergies   Review of Systems  Constitutional: Positive for fatigue. Negative for fever,  chills, diaphoresis and appetite change.  Eyes: Negative for visual disturbance.  Respiratory: Negative for cough and shortness of breath.   Cardiovascular: Negative for chest pain, palpitations and leg swelling.  Gastrointestinal: Positive for abdominal pain, diarrhea, constipation, blood in stool, anal bleeding and rectal pain. Negative for nausea, vomiting and abdominal distention.  Genitourinary: Negative for decreased urine volume.  Neurological: Negative for syncope and headaches.  Hematological: Does not bruise/bleed easily.      BP 120/78  Pulse 72  Temp(Src) 97.9 F (36.6 C) (Oral)  Resp 16  Ht 4' 11.5" (1.511 m)  Wt 113 lb 6.4 oz (51.438 kg)  BMI 22.53 kg/m2  SpO2 94% Objective:   Physical Exam  Constitutional: She is oriented to person, place, and time. She appears well-developed and well-nourished. No distress.  HENT:  Head:  Normocephalic and atraumatic.  Neck: Normal range of motion. Neck supple. No thyromegaly present.  Cardiovascular: Normal rate, regular rhythm, normal heart sounds and intact distal pulses.   Pulmonary/Chest: Effort normal and breath sounds normal. No respiratory distress.  Abdominal: Soft. Bowel sounds are normal. She exhibits no distension. There is no tenderness. There is no rebound, no guarding and no CVA tenderness.  Musculoskeletal: She exhibits no edema.  Lymphadenopathy:    She has no cervical adenopathy.  Neurological: She is alert and oriented to person, place, and time.  Skin: Skin is warm and dry. She is not diaphoretic. No erythema.  Psychiatric: She has a normal mood and affect. Her behavior is normal.          Assessment & Plan:  Stool guaiac positive - Plan: CBC with Differential, Ferritin, IBC Panel, Iron  No orders of the defined types were placed in this encounter.     Cristina Cheadle, MD MPH  Results for orders placed in visit on 11/06/13  CBC WITH DIFFERENTIAL      Result Value Ref Range   WBC 9.3  4.0 - 10.5 K/uL   RBC 4.18  3.87 - 5.11 MIL/uL   Hemoglobin 13.0  12.0 - 15.0 g/dL   HCT 38.3  36.0 - 46.0 %   MCV 91.6  78.0 - 100.0 fL   MCH 31.1  26.0 - 34.0 pg   MCHC 33.9  30.0 - 36.0 g/dL   RDW 13.3  11.5 - 15.5 %   Platelets 225  150 - 400 K/uL   Neutrophils Relative % 77  43 - 77 %   Neutro Abs 7.2  1.7 - 7.7 K/uL   Lymphocytes Relative 14  12 - 46 %   Lymphs Abs 1.3  0.7 - 4.0 K/uL   Monocytes Relative 5  3 - 12 %   Monocytes Absolute 0.5  0.1 - 1.0 K/uL   Eosinophils Relative 4  0 - 5 %   Eosinophils Absolute 0.4  0.0 - 0.7 K/uL   Basophils Relative 0  0 - 1 %   Basophils Absolute 0.0  0.0 - 0.1 K/uL   Smear Review Criteria for review not met    FERRITIN      Result Value Ref Range   Ferritin 55  10 - 291 ng/mL  IBC PANEL      Result Value Ref Range   UIBC 298  125 - 400 ug/dL   TIBC 365  250 - 470 ug/dL   %SAT 18 (*) 20 - 55 %  IRON       Result Value Ref Range   Iron 67  42 - 145 ug/dL

## 2013-11-30 ENCOUNTER — Telehealth: Payer: Self-pay

## 2013-11-30 NOTE — Telephone Encounter (Signed)
PT IN NEED OF HER HYDROCODONE. PLEASE CALL [phone]

## 2013-12-01 MED ORDER — HYDROCODONE-ACETAMINOPHEN 10-325 MG PO TABS: ORAL_TABLET | ORAL | Status: DC

## 2013-12-01 NOTE — Telephone Encounter (Signed)
Hydrocodone- APAP refilled; notify pt to pick up RX from [facility]; requires signature.

## 2013-12-02 ENCOUNTER — Other Ambulatory Visit: Payer: Self-pay | Admitting: Family Medicine

## 2013-12-02 NOTE — Telephone Encounter (Signed)
lmom that rx is ready for pickup.

## 2013-12-29 ENCOUNTER — Other Ambulatory Visit (HOSPITAL_COMMUNITY): Payer: Medicare Other

## 2013-12-29 ENCOUNTER — Ambulatory Visit: Payer: Medicare Other | Admitting: Vascular Surgery

## 2014-01-04 ENCOUNTER — Telehealth: Payer: Self-pay

## 2014-01-04 NOTE — Telephone Encounter (Signed)
Pt called wanting to get a refill prescription for Hydrocodone. [phone]    Thank you,

## 2014-01-06 MED ORDER — HYDROCODONE-ACETAMINOPHEN 10-325 MG PO TABS: ORAL_TABLET | ORAL | Status: DC

## 2014-01-06 NOTE — Telephone Encounter (Signed)
Pt calling again, stating that she is completely out of her oxycodone.

## 2014-01-06 NOTE — Telephone Encounter (Signed)
Hydrocodone- APAP prescription at [facility] building for pt to pick-up. Signature required. I notified pt.

## 2014-01-06 NOTE — Telephone Encounter (Signed)
Please advise 

## 2014-01-07 ENCOUNTER — Other Ambulatory Visit (HOSPITAL_COMMUNITY): Payer: Medicare Other

## 2014-01-07 ENCOUNTER — Ambulatory Visit: Payer: Medicare Other

## 2014-01-26 ENCOUNTER — Other Ambulatory Visit: Payer: Self-pay | Admitting: Gastroenterology

## 2014-01-26 ENCOUNTER — Other Ambulatory Visit: Payer: Self-pay | Admitting: Family Medicine

## 2014-01-27 ENCOUNTER — Encounter: Payer: Self-pay | Admitting: Family

## 2014-01-28 ENCOUNTER — Other Ambulatory Visit (HOSPITAL_COMMUNITY): Payer: Medicare Other

## 2014-01-28 ENCOUNTER — Ambulatory Visit: Payer: Medicare Other | Admitting: Family

## 2014-02-01 ENCOUNTER — Other Ambulatory Visit: Payer: Self-pay | Admitting: *Deleted

## 2014-02-01 DIAGNOSIS — K2100 Gastro-esophageal reflux disease with esophagitis, without bleeding: Secondary | ICD-10-CM

## 2014-02-01 DIAGNOSIS — K315 Obstruction of duodenum: Secondary | ICD-10-CM

## 2014-02-01 DIAGNOSIS — R1013 Epigastric pain: Secondary | ICD-10-CM

## 2014-02-01 DIAGNOSIS — K269 Duodenal ulcer, unspecified as acute or chronic, without hemorrhage or perforation: Secondary | ICD-10-CM

## 2014-02-02 ENCOUNTER — Ambulatory Visit: Payer: Medicare Other | Admitting: Vascular Surgery

## 2014-02-02 ENCOUNTER — Other Ambulatory Visit (HOSPITAL_COMMUNITY): Payer: Medicare Other

## 2014-02-05 ENCOUNTER — Telehealth: Payer: Self-pay

## 2014-02-05 NOTE — Telephone Encounter (Signed)
Needs a refill of hydrocodone.

## 2014-02-06 MED ORDER — NEXIUM 40 MG PO CPDR: 40.0000 mg | DELAYED_RELEASE_CAPSULE | Freq: Every day | ORAL | Status: DC

## 2014-02-06 MED ORDER — HYDROCODONE-ACETAMINOPHEN 10-325 MG PO TABS: ORAL_TABLET | ORAL | Status: DC

## 2014-02-06 NOTE — Telephone Encounter (Signed)
HC- APAP 10-325 MG prescription printed and signed. Pt can pick up RX at [facility] on Monday, 02/08/2014.

## 2014-02-08 NOTE — Telephone Encounter (Signed)
Lm advising pt at [facility] to pick up.

## 2014-03-09 ENCOUNTER — Other Ambulatory Visit (HOSPITAL_COMMUNITY): Payer: Medicare Other

## 2014-03-09 ENCOUNTER — Ambulatory Visit: Payer: Medicare Other | Admitting: Family

## 2014-03-10 ENCOUNTER — Other Ambulatory Visit: Payer: Self-pay | Admitting: *Deleted

## 2014-03-11 MED ORDER — HYDROCODONE-ACETAMINOPHEN 10-325 MG PO TABS: ORAL_TABLET | ORAL | Status: DC

## 2014-03-11 NOTE — Telephone Encounter (Signed)
Hydrocodone -APAP prescription ready for pick up at [facility] [address].

## 2014-04-02 ENCOUNTER — Ambulatory Visit: Payer: Medicare Other | Admitting: Family Medicine

## 2014-04-05 ENCOUNTER — Encounter: Payer: Self-pay | Admitting: Vascular Surgery

## 2014-04-06 ENCOUNTER — Encounter: Payer: Self-pay | Admitting: Family

## 2014-04-06 ENCOUNTER — Ambulatory Visit (HOSPITAL_COMMUNITY)
Admission: RE | Admit: 2014-04-06 | Discharge: 2014-04-06 | Disposition: A | Discharge status: Home / Self Care | Payer: Medicare Other | Source: Ambulatory Visit | Attending: Vascular Surgery | Admitting: Vascular Surgery

## 2014-04-06 ENCOUNTER — Ambulatory Visit (INDEPENDENT_AMBULATORY_CARE_PROVIDER_SITE_OTHER): Payer: Medicare Other | Admitting: Family

## 2014-04-06 VITALS — BP 96/70 | HR 72 | Resp 16 | Ht 59.0 in | Wt 115.0 lb

## 2014-04-06 DIAGNOSIS — I6523 Occlusion and stenosis of bilateral carotid arteries: Secondary | ICD-10-CM

## 2014-04-06 DIAGNOSIS — Z48812 Encounter for surgical aftercare following surgery on the circulatory system: Secondary | ICD-10-CM | POA: Insufficient documentation

## 2014-04-06 DIAGNOSIS — I6529 Occlusion and stenosis of unspecified carotid artery: Secondary | ICD-10-CM | POA: Diagnosis present

## 2014-04-06 NOTE — Progress Notes (Signed)
Established Carotid Patient   History of Present Illness  Cristina Parker is a 78 y.o. female patient of Dr. Donnetta Hutching is who underwent aortic innominate graft in January 2005, left CEA in 1999 and a right CEA in October 2012.  She returns today for follow up.  The patient has negative history of TIA or stroke symptoms, specifically the patient denies a history of amaurosis fugax or monocular blindness, denies a history unilateral  of facial drooping, denies a history of hemiplegia, and denies a history of receptive or expressive aphasia.   She has intermittent tingling in both hands, she has known c-spine issues.  The patient denies New Medical or Surgical History.  Pt Diabetic: No Pt smoker: former smoker, quit in 1998  Pt meds include: Statin : Yes ASA: Yes Other anticoagulants/antiplatelets: Plavix   Past Medical History  Diagnosis Date  . Heart disease   . Crohn's disease   . Duodenal stricture   . Esophageal reflux   . Hypertension   . Hyperlipemia   . Hiatal hernia   . Pneumonia   . Anemia   . Depression   . Collagenous colitis   . Anxiety   . Paroxysmal a-fib 07/09/2011  . Peripheral vascular disease   . Blood transfusion without reported diagnosis   . Duodenal ulcer     Social History History  Substance Use Topics  . Smoking status: Former Smoker    Types: Cigarettes    Quit date: 06/18/1996  . Smokeless tobacco: Never Used  . Alcohol Use: 3.5 - 7.0 oz/week    7-14 drink(s) per week    Family History Family History  Problem Relation Age of Onset  . Colon polyps Father   . Heart disease Father     Before age 62  . Stroke Father   . Deep vein thrombosis Father   . Colon cancer Neg Hx   . Esophageal cancer Neg Hx   . Rectal cancer Neg Hx   . Stomach cancer Neg Hx   . Vascular Disease Brother   . Alcoholism Paternal Grandfather   . Heart disease Paternal Grandmother   . Hypertension Mother   . Varicose Veins Mother   . Peripheral vascular disease  Mother     Surgical History Past Surgical History  Procedure Laterality Date  . Coronary artery bypass graft    . Hip surgery      right  . Abdominal hysterectomy    . Cervical disc surgery    . Bunionectomy  1977    both feet   . Fracture surgery  1998    ORIF  . Eye surgery  10/2010    left eye cataract  . Carotid endarterectomy  05-1998    Left CEA  . Carotid endarterectomy  04/11/11    Right CEA  . Aorto- innominate bpg  2005    for innominate artery occlusive disease  . Colonoscopy      No Known Allergies  Current Outpatient Prescriptions  Medication Sig Dispense Refill  . aspirin 81 MG tablet Take 81 mg by mouth every morning.       Marland Kitchen atorvastatin (LIPITOR) 40 MG tablet Take 1 tablet (40 mg total) by mouth daily.  90 tablet  3  . Calcium Citrate-Vitamin D 250-200 MG-UNIT TABS Take 2 tablets by mouth daily.        . clopidogrel (PLAVIX) 75 MG tablet Take 1 tablet (75 mg total) by mouth at bedtime.  90 tablet  3  . escitalopram (  LEXAPRO) 20 MG tablet Take 1 tablet (20 mg total) by mouth daily.  90 tablet  3  . HYDROcodone-acetaminophen (NORCO) 10-325 MG per tablet Take 1/2 tablet every 8-12 hours as needed for pain.  30 tablet  0  . LORazepam (ATIVAN) 0.5 MG tablet Take 0.5 tablets (0.25 mg total) by mouth 2 (two) times daily as needed for anxiety.  30 tablet  1  . metoprolol succinate (TOPROL-XL) 50 MG 24 hr tablet Take 1 tablet (50 mg total) by mouth daily. with or immediately following a meal.  30 tablet  5  . Misc. Devices (HUGO ROLLING WALKER ELITE) MISC 1 Device by Does not apply route daily.  1 each  0  . NEXIUM 40 MG capsule Take 1 capsule (40 mg total) by mouth daily before breakfast.  30 capsule  5  . lisinopril-hydrochlorothiazide (PRINZIDE,ZESTORETIC) 10-12.5 MG per tablet Take 1 tablet by mouth daily.  90 tablet  3   No current facility-administered medications for this visit.    Review of Systems : See HPI for pertinent positives and  negatives.  Physical Examination  Filed Vitals:   04/06/14 1505 04/06/14 1515  BP: 118/76 96/70  Pulse: 72 72  Resp:  16  Height:  _0  (1.499 m)  Weight:  115 lb (52.164 kg)  SpO2:  93%   Body mass index is 23.21 kg/(m^2).  General: WDWN female in NAD GAIT: normal Eyes: PERRLA Pulmonary:  Non-labored, CTAB, Negative  Rales, Negative rhonchi, & Negative wheezing.  Cardiac: regular Rhythm ,  Negative detected murmur.  VASCULAR EXAM Carotid Bruits Right Left   Positive, harsh Positive, soft    Aorta is not palpable. Radial pulses are 1+ palpable and equal.                                                                                                                            LE Pulses Right Left       FEMORAL  2+ palpable  2+ palpable        POPLITEAL  not palpable   not palpable       POSTERIOR TIBIAL  1+ palpable   2+ palpable        DORSALIS PEDIS      ANTERIOR TIBIAL 1+ palpable  2+ palpable     Gastrointestinal: soft, nontender, BS WNL, no r/g,  negative palpated masses.  Musculoskeletal: Negative muscle atrophy/wasting. M/S 5/5 throughout, Extremities without ischemic changes.  Neurologic: A&O X 3; Appropriate Affect ; SENSATION ;normal;  Speech is normal CN 2-12 intact, Pain and light touch intact in extremities, Motor exam as listed above.   Non-Invasive Vascular Imaging CAROTID DUPLEX 04/06/2014   CEREBROVASCULAR DUPLEX EVALUATION    INDICATION: Carotid stenosis    PREVIOUS INTERVENTION(S): Aorta-Innominate graft 07/15/2003;Left carotid endarterectomy 1999; right carotid endarterectomy 04/11/2011    DUPLEX EXAM:     RIGHT  LEFT  Peak Systolic Velocities (cm/s) End Diastolic Velocities (cm/s) Plaque LOCATION Peak Systolic  Velocities (cm/s) End Diastolic Velocities (cm/s) Plaque  145 13  CCA PROXIMAL 0  HT  151 24  CCA MID 0  HT  135 25  CCA DISTAL 60 8 CP  106 10  ECA 28 4 HT  109 36  ICA PROXIMAL 0 0 occluded  138 40  ICA MID 0 0  occluded  110 38  ICA DISTAL 0 0 occluded    carotid endarterectomy ICA / CCA Ratio (PSV) carotid endarterectomy  Abnormal Antegrade Vertebral Flow Retrograde  682 Brachial Systolic Pressure (mmHg) 574  Triphasic Brachial Artery Waveforms Monophasic    Plaque Morphology:  HM = Homogeneous, HT = Heterogeneous, CP = Calcific Plaque, SP = Smooth Plaque, IP = Irregular Plaque     ADDITIONAL FINDINGS:     IMPRESSION: Right internal carotid artery is patent with history of carotid endarterectomy, no hyperplasia or hemodynamically significant plaque visualized. Known left common carotid artery and internal carotid artery occlusion.    Compared to the previous exam:  Essentially unchanged since previous study on 12/30/2012.    Assessment: Cristina Parker is a 78 y.o. female who is s/p aortic innominate graft in January 2005, left CEA in 1999 and a right CEA in October 2012. She presents with asymptomatic patent right ICA with history of carotid endarterectomy, no hyperplasia or hemodynamically significant plaque visualized. Known left common carotid artery and internal carotid artery occlusion. Essentially unchanged since previous study on 12/30/2012.  Dr. Donnetta Hutching indicates in his progress note that she is not a candidate for carotid subclavian bypass.   Plan: Follow-up in 1 year with Carotid Duplex scan.   I discussed in depth with the patient the nature of atherosclerosis, and emphasized the importance of maximal medical management including strict control of blood pressure, blood glucose, and lipid levels, obtaining regular exercise, and continued cessation of smoking.  The patient is aware that without maximal medical management the underlying atherosclerotic disease process will progress, limiting the benefit of any interventions. The patient was given information about stroke prevention and what symptoms should prompt the patient to seek immediate medical care. Thank you for allowing [place]  to participate in this patient's care.  Clemon Chambers, RN, MSN, FNP-C [facility] Office: [phone]  Clinic Physician: Kellie Simmering  04/06/2014 3:20 PM

## 2014-04-06 NOTE — Patient Instructions (Signed)
Stroke Prevention Some medical conditions and behaviors are associated with an increased chance of having a stroke. You may prevent a stroke by making healthy choices and managing medical conditions. HOW CAN I REDUCE MY RISK OF HAVING A STROKE?   Stay physically active. Get at least 30 minutes of activity on most or all days.  Do not smoke. It may also be helpful to avoid exposure to secondhand smoke.  Limit alcohol use. Moderate alcohol use is considered to be:  No more than 2 drinks per day for men.  No more than 1 drink per day for nonpregnant women.  Eat healthy foods. This involves:  Eating 5 or more servings of fruits and vegetables a day.  Making dietary changes that address high blood pressure (hypertension), high cholesterol, diabetes, or obesity.  Manage your cholesterol levels.  Making food choices that are high in fiber and low in saturated fat, trans fat, and cholesterol may control cholesterol levels.  Take any prescribed medicines to control cholesterol as directed by your health care provider.  Manage your diabetes.  Controlling your carbohydrate and sugar intake is recommended to manage diabetes.  Take any prescribed medicines to control diabetes as directed by your health care provider.  Control your hypertension.  Making food choices that are low in salt (sodium), saturated fat, trans fat, and cholesterol is recommended to manage hypertension.  Take any prescribed medicines to control hypertension as directed by your health care provider.  Maintain a healthy weight.  Reducing calorie intake and making food choices that are low in sodium, saturated fat, trans fat, and cholesterol are recommended to manage weight.  Stop drug abuse.  Avoid taking birth control pills.  Talk to your health care provider about the risks of taking birth control pills if you are over 35 years old, smoke, get migraines, or have ever had a blood clot.  Get evaluated for sleep  disorders (sleep apnea).  Talk to your health care provider about getting a sleep evaluation if you snore a lot or have excessive sleepiness.  Take medicines only as directed by your health care provider.  For some people, aspirin or blood thinners (anticoagulants) are helpful in reducing the risk of forming abnormal blood clots that can lead to stroke. If you have the irregular heart rhythm of atrial fibrillation, you should be on a blood thinner unless there is a good reason you cannot take them.  Understand all your medicine instructions.  Make sure that other conditions (such as anemia or atherosclerosis) are addressed. SEEK IMMEDIATE MEDICAL CARE IF:   You have sudden weakness or numbness of the face, arm, or leg, especially on one side of the body.  Your face or eyelid droops to one side.  You have sudden confusion.  You have trouble speaking (aphasia) or understanding.  You have sudden trouble seeing in one or both eyes.  You have sudden trouble walking.  You have dizziness.  You have a loss of balance or coordination.  You have a sudden, severe headache with no known cause.  You have new chest pain or an irregular heartbeat. Any of these symptoms may represent a serious problem that is an emergency. Do not wait to see if the symptoms will go away. Get medical help at once. Call your local emergency services (911 in [place] Do not drive yourself to the hospital. Document Released: 07/12/2004 Document Revised: 10/19/2013 Document Reviewed: 12/05/2012 Exit[employer] Patient Information 2015 [employer]. This information is not intended to replace advice given   to you by your health care provider. Make sure you discuss any questions you have with your health care provider.

## 2014-04-07 NOTE — Addendum Note (Signed)
Addended by: Mena Goes on: 04/07/2014 09:17 AM   Modules accepted: Orders

## 2014-04-08 ENCOUNTER — Ambulatory Visit: Payer: Medicare Other | Admitting: Family Medicine

## 2014-04-09 ENCOUNTER — Encounter: Payer: Self-pay | Admitting: Family Medicine

## 2014-04-09 ENCOUNTER — Ambulatory Visit (INDEPENDENT_AMBULATORY_CARE_PROVIDER_SITE_OTHER): Payer: Medicare Other | Admitting: Family Medicine

## 2014-04-09 VITALS — BP 100/76 | HR 93 | Temp 98.0°F | Resp 16 | Ht 58.75 in | Wt 114.0 lb

## 2014-04-09 DIAGNOSIS — Z760 Encounter for issue of repeat prescription: Secondary | ICD-10-CM

## 2014-04-09 DIAGNOSIS — Z23 Encounter for immunization: Secondary | ICD-10-CM

## 2014-04-09 DIAGNOSIS — M1990 Unspecified osteoarthritis, unspecified site: Secondary | ICD-10-CM

## 2014-04-09 DIAGNOSIS — F329 Major depressive disorder, single episode, unspecified: Secondary | ICD-10-CM

## 2014-04-09 DIAGNOSIS — F32A Depression, unspecified: Secondary | ICD-10-CM

## 2014-04-09 MED ORDER — METOPROLOL SUCCINATE ER 50 MG PO TB24: 25.0000 mg | ORAL_TABLET | Freq: Every day | ORAL | Status: DC

## 2014-04-09 MED ORDER — HYDROCODONE-ACETAMINOPHEN 10-325 MG PO TABS: ORAL_TABLET | ORAL | Status: DC

## 2014-04-09 MED ORDER — LORAZEPAM 0.5 MG PO TABS: 0.2500 mg | ORAL_TABLET | Freq: Two times a day (BID) | ORAL | Status: DC | PRN

## 2014-04-09 NOTE — Progress Notes (Signed)
S: This 78 y.o. Cauc female has depression, HTN w/ CAD/ cerebrovascular disease and arthritis. Current medications are effective; she is taking Lexapro w/ adverse effects and feels stable, si abstaining from alcohol use and rests well. Cardiology follow-up w/ Dr. Johnsie Cancel for CAD and aortic stenosis and vascular disease follow-up visits w/ Dr.Todd Early re: carotid artery occlusion are current. She has no myalgias or GI problems w/ atorvastatin. Pt does have chronic arthralgias, treated w/ HC- APAP 10-325 mg 1/2 tab 2-3 times daily prn.    Patient Active Problem List   Diagnosis Date Noted  . Carotid stenosis 04/06/2014  . Aftercare following surgery of the circulatory system 04/06/2014  . Cataract 09/25/2012  . Arthritis 09/25/2012  . Depression 09/25/2012  . Osteoporosis, unspecified 09/25/2012  . Duodenal stricture 09/16/2012  . Murmur 12/19/2011  . Occlusion and stenosis of carotid artery without mention of cerebral infarction 11/15/2011  . Paroxysmal a-fib 07/09/2011  . COPD (chronic obstructive pulmonary disease) 07/03/2011  . OTHER ULCERATIVE COLITIS 08/09/2009  . PVD 10/31/2007  . HIATAL HERNIA 10/31/2007  . Other and unspecified hyperlipidemia 07/18/2007  . HYPERTENSION 07/18/2007  . CORONARY HEART DISEASE 07/18/2007  . VENTRICULAR ARRHYTHMIA 07/18/2007  . LUNG NODULE 07/18/2007  . G E R D 07/18/2007  . CAROTID ENDARTERECTOMY, LEFT, HX OF 07/18/2007    Prior to Admission medications   Medication Sig Start Date End Date Taking? Authorizing Provider  aspirin 81 MG tablet Take 81 mg by mouth every morning.    Yes Historical Provider, MD  atorvastatin (LIPITOR) 40 MG tablet Take 1 tablet (40 mg total) by mouth daily. 10/01/13  Yes Barton Fanny, MD  Calcium Citrate-Vitamin D 250-200 MG-UNIT TABS Take 2 tablets by mouth daily.     Yes Historical Provider, MD  clopidogrel (PLAVIX) 75 MG tablet Take 1 tablet (75 mg total) by mouth at bedtime. 10/01/13  Yes Barton Fanny, MD   escitalopram (LEXAPRO) 20 MG tablet Take 1 tablet (20 mg total) by mouth daily. 10/01/13  Yes Barton Fanny, MD  HYDROcodone-acetaminophen [facility]) 10-325 MG per tablet Take 1/2 tablet every 8-12 hours as needed for pain. 03/11/14  Yes Barton Fanny, MD  LORazepam (ATIVAN) 0.5 MG tablet Take 0.5 tablets (0.25 mg total) by mouth 2 (two) times daily as needed for anxiety. 11/05/13  Yes Barton Fanny, MD  metoprolol succinate (TOPROL-XL) 50 MG 24 hr tablet Take 1 tablet (50 mg total) by mouth daily. with or immediately following a meal. 10/01/13  Yes Barton Fanny, MD  NEXIUM 40 MG capsule Take 1 capsule (40 mg total) by mouth daily before breakfast. 02/06/14  Yes Barton Fanny, MD  lisinopril-hydrochlorothiazide (PRINZIDE,ZESTORETIC) 10-12.5 MG per tablet Take 1 tablet by mouth daily. 06/19/12   Barton Fanny, MD  Misc. Devices (HUGO ROLLING WALKER ELITE) MISC 1 Device by Does not apply route daily. 07/16/13   Barton Fanny, MD    ROS: AS per HPI.  Jenetta DownerDanley Danker Vitals:   04/09/14 1142  BP: 100/76  Pulse: 93  Temp: 98 F (36.7 C)  Resp: 16    GEN: In NAD; WN,WD. Appears stated age and chronically ill. HENT: [place]/AT; EOMI w/ clear conj/sclerae. Otherwise unremarkable. COR: RRR. LUNGS: Unlabored resp. SKIN: W&D; intact but slightly pale w/ fair turgor. NEURO: A&O x 3; CNs intact. Nonfocal.  A/P: Depression- Stable on Lexapro.  Arthritis- Refill HC- APAP.  Flu vaccine need - Plan: Flu Vaccine QUAD 36+ mos IM  Repeat prescription issue  Meds ordered this encounter  . LORazepam (ATIVAN) 0.5 MG tablet    Sig: Take 0.5 tablets (0.25 mg total) by mouth 2 (two) times daily as needed for anxiety.    Dispense:  30 tablet    Refill:  2    Each prescription needs to last 30 days.  . metoprolol succinate (TOPROL-XL) 50 MG 24 hr tablet    Sig: Take 1 tablet (50 mg total) by mouth daily. with or immediately following a meal.    Dispense:  30 tablet    Refill:   5  .  HYDROcodone-acetaminophen (NORCO) 10-325 MG per tablet    Sig: Take 1/2 tablet every 8-12 hours as needed for pain.    Dispense:  30 tablet    Refill:  0    May fill on or after Jun 10, 2014.  Marland Kitchen HYDROcodone-acetaminophen (NORCO) 10-325 MG per tablet    Sig: Take 1/2 tablet every 8-12 hours as needed for pain.    Dispense:  30 tablet    Refill:  0    May fill on or after May 11, 2014.

## 2014-04-14 ENCOUNTER — Other Ambulatory Visit: Payer: Self-pay | Admitting: *Deleted

## 2014-04-15 NOTE — Telephone Encounter (Signed)
Lorazepam RX was printed and given to pt at her last OV on 04/09/2014.

## 2014-05-12 ENCOUNTER — Other Ambulatory Visit: Payer: Self-pay | Admitting: Family Medicine

## 2014-05-21 ENCOUNTER — Ambulatory Visit (INDEPENDENT_AMBULATORY_CARE_PROVIDER_SITE_OTHER): Payer: Medicare Other | Admitting: Radiology

## 2014-05-21 DIAGNOSIS — Z23 Encounter for immunization: Secondary | ICD-10-CM

## 2014-06-07 ENCOUNTER — Telehealth: Payer: Self-pay

## 2014-06-07 NOTE — Telephone Encounter (Signed)
HYDROcodone-acetaminophen (NORCO) 10-325 MG per tablet [[id number]]    Pt requesting refill

## 2014-06-08 MED ORDER — HYDROCODONE-ACETAMINOPHEN 10-325 MG PO TABS
ORAL_TABLET | ORAL | Status: DC
Start: 2014-06-08 — End: 2014-07-08

## 2014-06-08 NOTE — Telephone Encounter (Signed)
Hydrocodone- APAP prescription at [address] for pick-up on Wednesday, 06/09/2014. Signature required.

## 2014-06-09 NOTE — Telephone Encounter (Signed)
Pt notified.

## 2014-07-08 ENCOUNTER — Telehealth: Payer: Self-pay

## 2014-07-08 ENCOUNTER — Other Ambulatory Visit: Payer: Self-pay | Admitting: Family Medicine

## 2014-07-08 MED ORDER — HYDROCODONE-ACETAMINOPHEN 10-325 MG PO TABS: ORAL_TABLET | ORAL | Status: DC

## 2014-07-08 NOTE — Telephone Encounter (Signed)
Pt called wanting a refill on LORazepam (ATIVAN) 0.5 MG tablet [[id number]] at the [facility] and [address]. She would like a refill on Vicodin also, her son would pick it up. Please advise at [phone]

## 2014-07-08 NOTE — Telephone Encounter (Signed)
Lorazepam has been phoned to her pharmacy. Hydrocodone- APAP prescription will be at [address] for pick-up this evening (07/08/2014). Signature required.

## 2014-07-11 NOTE — Telephone Encounter (Signed)
Pt has already picked up script.

## 2014-08-10 ENCOUNTER — Telehealth: Payer: Self-pay

## 2014-08-10 MED ORDER — HYDROCODONE-ACETAMINOPHEN 10-325 MG PO TABS: ORAL_TABLET | ORAL | Status: DC

## 2014-08-10 NOTE — Telephone Encounter (Signed)
Pt states she really need her VICODIN called in for her. Please call [phone]     [facility] on [address]

## 2014-08-10 NOTE — Telephone Encounter (Signed)
Spoke w/ pt to get an update re: pain status; she has chronic LBP due to DDD. She also has osteoporosis and R hip pain, s/p THR by Dr. Alvan Dame. HC- APAP allows her to be more active as she has increased pain when she stays in bed or with prolonged sitting. She cannot take OTC NSAIDs due to GI problems. Pt states she would not abuse medication and that it is easier for abusers to get medication on the street. I explained that, as her prescribing physician, I need to review these issues w/ her and ask what she sees as the long term plan for pain management. She assures me she is not abusing the medication and has not increased dosing. I suggested she think about a follow-up w/ Dr.Olin and consider Pain Management or some other specialty [facility] Medicine) ti help chronic pain issues. She will think about it. RX: HC-APAP will be at [address] for pick-up on Aug 11, 2014.

## 2014-09-14 ENCOUNTER — Telehealth: Payer: Self-pay

## 2014-09-14 MED ORDER — HYDROCODONE-ACETAMINOPHEN 10-325 MG PO TABS: ORAL_TABLET | ORAL | Status: DC

## 2014-09-14 NOTE — Telephone Encounter (Signed)
Pt of Dr. Leward Quan requesting refill on HYDROcodone-acetaminophen [facility]) 10-325 MG per tablet [[id number]]

## 2014-09-14 NOTE — Telephone Encounter (Signed)
Hydrocodone-APAP prescription is at [address] for pick-up on Wednesday, September 15, 2014.  Signature required.

## 2014-09-15 NOTE — Telephone Encounter (Signed)
Pt notified.

## 2014-09-17 ENCOUNTER — Encounter: Payer: Medicare Other | Admitting: Family Medicine

## 2014-09-24 ENCOUNTER — Encounter: Payer: Self-pay | Admitting: Family Medicine

## 2014-10-07 ENCOUNTER — Other Ambulatory Visit: Payer: Self-pay | Admitting: Family Medicine

## 2014-10-12 ENCOUNTER — Telehealth: Payer: Self-pay

## 2014-10-12 NOTE — Telephone Encounter (Signed)
Pt would like a refill on her vicodin.  Best# [phone]

## 2014-10-15 MED ORDER — HYDROCODONE-ACETAMINOPHEN 10-325 MG PO TABS: ORAL_TABLET | ORAL | Status: DC

## 2014-10-15 NOTE — Telephone Encounter (Signed)
Spoke to patient and notified her that her RX is ready for pick up at front desk at [address].

## 2014-10-15 NOTE — Telephone Encounter (Signed)
Please notify pt that her prescription is at [address] for pick-up today (Friday- April 29,2016).

## 2014-10-28 ENCOUNTER — Encounter: Payer: Self-pay | Admitting: Family Medicine

## 2014-11-04 ENCOUNTER — Ambulatory Visit (INDEPENDENT_AMBULATORY_CARE_PROVIDER_SITE_OTHER): Payer: Medicare Other | Admitting: Family Medicine

## 2014-11-04 ENCOUNTER — Encounter: Payer: Self-pay | Admitting: Family Medicine

## 2014-11-04 VITALS — BP 127/77 | HR 75 | Temp 97.6°F | Resp 16 | Ht 59.0 in | Wt 119.8 lb

## 2014-11-04 DIAGNOSIS — G894 Chronic pain syndrome: Secondary | ICD-10-CM

## 2014-11-04 DIAGNOSIS — Z0000 Encounter for general adult medical examination without abnormal findings: Secondary | ICD-10-CM | POA: Diagnosis not present

## 2014-11-04 DIAGNOSIS — J449 Chronic obstructive pulmonary disease, unspecified: Secondary | ICD-10-CM

## 2014-11-04 DIAGNOSIS — E785 Hyperlipidemia, unspecified: Secondary | ICD-10-CM | POA: Diagnosis not present

## 2014-11-04 DIAGNOSIS — F1190 Opioid use, unspecified, uncomplicated: Secondary | ICD-10-CM

## 2014-11-04 DIAGNOSIS — I10 Essential (primary) hypertension: Secondary | ICD-10-CM

## 2014-11-04 DIAGNOSIS — R0600 Dyspnea, unspecified: Secondary | ICD-10-CM

## 2014-11-04 LAB — COMPLETE METABOLIC PANEL WITH GFR
ALBUMIN: 4.2 g/dL (ref 3.5–5.2)
ALK PHOS: 108 U/L (ref 39–117)
ALT: 11 U/L (ref 0–35)
AST: 18 U/L (ref 0–37)
BUN: 22 mg/dL (ref 6–23)
CO2: 25 mEq/L (ref 19–32)
Calcium: 9.1 mg/dL (ref 8.4–10.5)
Chloride: 100 mEq/L (ref 96–112)
Creat: 0.7 mg/dL (ref 0.50–1.10)
GFR, Est African American: >89 mL/min
GFR, Est Non African American: 84 mL/min
Glucose, Bld: 79 mg/dL (ref 70–99)
Potassium: 4 mEq/L (ref 3.5–5.3)
Sodium: 136 mEq/L (ref 135–145)
Total Bilirubin: 0.5 mg/dL (ref 0.2–1.2)
Total Protein: 6.8 g/dL (ref 6.0–8.3)

## 2014-11-04 LAB — LIPID PANEL
CHOL/HDL RATIO: 1.9 ratio
Cholesterol: 124 mg/dL (ref 0–200)
HDL: 67 mg/dL (ref >=46)
LDL CALC: 39 mg/dL (ref 0–99)
TRIGLYCERIDES: 89 mg/dL (ref <150)
VLDL: 18 mg/dL (ref 0–40)

## 2014-11-04 LAB — POCT URINALYSIS DIPSTICK
Bilirubin, UA: NEGATIVE
Glucose, UA: NEGATIVE
Ketones, UA: NEGATIVE
Leukocytes, UA: NEGATIVE
NITRITE UA: NEGATIVE
PH UA: 6
Protein, UA: NEGATIVE
Spec Grav, UA: <=1.005
Urobilinogen, UA: 0.2

## 2014-11-04 MED ORDER — ESCITALOPRAM OXALATE 20 MG PO TABS: 20.0000 mg | ORAL_TABLET | Freq: Every day | ORAL | Status: DC

## 2014-11-04 MED ORDER — HYDROCODONE-ACETAMINOPHEN 10-325 MG PO TABS: ORAL_TABLET | ORAL | Status: DC

## 2014-11-04 MED ORDER — METOPROLOL SUCCINATE ER 50 MG PO TB24: 25.0000 mg | ORAL_TABLET | Freq: Every day | ORAL | Status: DC

## 2014-11-04 MED ORDER — ALBUTEROL SULFATE 108 (90 BASE) MCG/ACT IN AEPB: 2.0000 | INHALATION_SPRAY | Freq: Four times a day (QID) | RESPIRATORY_TRACT | Status: DC | PRN

## 2014-11-04 MED ORDER — ATORVASTATIN CALCIUM 40 MG PO TABS: 40.0000 mg | ORAL_TABLET | Freq: Every day | ORAL | Status: DC

## 2014-11-04 MED ORDER — CLOPIDOGREL BISULFATE 75 MG PO TABS: 75.0000 mg | ORAL_TABLET | Freq: Every day | ORAL | Status: DC

## 2014-11-04 NOTE — Progress Notes (Signed)
Subjective:    Patient ID: Cristina Parker, female    DOB: February 18, 1936, 79 y.o.   MRN: [id number]  HPI  This 79 y.o female is here for [facility] Subsequent annual. Pt has HTN w/ CAD, cerebrovascular disease and ventricular arrhythmia, followed by Dr. Jenkins Rouge and Dr.Todd Early. Pt has COPD and is ex-smoker; she quit smoking in 1998 and now has daily dyspnea/DOE. Last CXR in Jan 2015 demonstrated COPD and RUL scarring. Pt had chronic pain associated with arthritis (esp R hip) and spine (s/p cervical fusion) and scoliosis.  HCM: MMG- > 4 years.           CRS- April 2012.           IMM- Current. Gets annual Flu vaccine.           Vision- Current.   Patient Active Problem List   Diagnosis Date Noted  . Chronic pain syndrome 11/04/2014  . Carotid stenosis 04/06/2014  . Aftercare following surgery of the circulatory system 04/06/2014  . Cataract 09/25/2012  . Arthritis 09/25/2012  . Depression 09/25/2012  . Osteoporosis, unspecified 09/25/2012  . Duodenal stricture 09/16/2012  . Murmur 12/19/2011  . Occlusion and stenosis of carotid artery without mention of cerebral infarction 11/15/2011  . Paroxysmal a-fib 07/09/2011  . COPD (chronic obstructive pulmonary disease) 07/03/2011  . OTHER ULCERATIVE COLITIS 08/09/2009  . PVD 10/31/2007  . HIATAL HERNIA 10/31/2007  . Other and unspecified hyperlipidemia 07/18/2007  . Essential hypertension 07/18/2007  . CORONARY HEART DISEASE 07/18/2007  . VENTRICULAR ARRHYTHMIA 07/18/2007  . LUNG NODULE 07/18/2007  . G E R D 07/18/2007  . CAROTID ENDARTERECTOMY, LEFT, HX OF 07/18/2007    Prior to Admission medications   Medication Sig Start Date End Date Taking? Authorizing Provider  Ascorbic Acid (VITAMIN C) 1000 MG tablet Take 1,000 mg by mouth daily.   Yes Historical Provider, MD  aspirin 81 MG tablet Take 81 mg by mouth every morning.    Yes Historical Provider, MD  atorvastatin (LIPITOR) 40 MG tablet Take 1 tablet (40 mg total) by mouth daily.    Yes Barton Fanny, MD  Calcium Citrate-Vitamin D 250-200 MG-UNIT TABS Take 2 tablets by mouth daily.     Yes Historical Provider, MD  clopidogrel (PLAVIX) 75 MG tablet Take 1 tablet (75 mg total) by mouth at bedtime.   Yes Barton Fanny, MD  escitalopram (LEXAPRO) 20 MG tablet Take 1 tablet (20 mg total) by mouth daily.   Yes Barton Fanny, MD  HYDROcodone-acetaminophen [facility]) 10-325 MG per tablet Take 1/2 tablet every 8-12 hours as needed for pain.   Yes Barton Fanny, MD  LORazepam (ATIVAN) 0.5 MG tablet TAKE 1/2 TABLET BY MOUTH TWICE DAILY AS NEEDED FOR ANXIETY 10/07/14  Yes Barton Fanny, MD  metoprolol succinate (TOPROL-XL) 50 MG 24 hr tablet Take 1 tablet (50 mg total) by mouth daily. with or immediately following a meal.   Yes Barton Fanny, MD  OVER THE COUNTER MEDICATION daily.   Yes Historical Provider, MD  lisinopril-hydrochlorothiazide (PRINZIDE,ZESTORETIC) 10-12.5 MG per tablet Take 1 tablet by mouth daily. Patient not taking: Reported on 11/04/2014 06/19/12   Barton Fanny, MD  Misc. Devices (HUGO ROLLING WALKER ELITE) MISC 1 Device by Does not apply route daily. 07/16/13   Barton Fanny, MD  NEXIUM 40 MG capsule Take 1 capsule (40 mg total) by mouth daily before breakfast. Patient not taking: Reported on 11/04/2014 02/06/14   Barton Fanny,  MD    Past Surgical History  Procedure Laterality Date  . Coronary artery bypass graft    . Hip surgery      right  . Abdominal hysterectomy    . Cervical disc surgery    . Bunionectomy  1977    both feet   . Fracture surgery  1998    ORIF  . Eye surgery  10/2010    left eye cataract  . Carotid endarterectomy  05-1998    Left CEA  . Carotid endarterectomy  04/11/11    Right CEA  . Aorto- innominate bpg  2005    for innominate artery occlusive disease  . Colonoscopy    . Cesarean section    . Tubal ligation      History   Social History  . Marital Status: Widowed    Spouse  Name: N/A  . Number of Children: 2  . Years of Education: N/A   Occupational History  . retired Therapist, sports    Social History Main Topics  . Smoking status: Former Smoker    Types: Cigarettes    Quit date: 06/18/1996  . Smokeless tobacco: Never Used  . Alcohol Use: 4.2 - 8.4 oz/week    7-14 Standard drinks or equivalent per week     Comment: WINE  . Drug Use: No  . Sexual Activity: No   Other Topics Concern  . Not on file   Social History Narrative   Retired Marine scientist;   Lives with son and grandson    Family History  Problem Relation Age of Onset  . Colon polyps Father   . Heart disease Father     Before age 22  . Stroke Father   . Deep vein thrombosis Father   . Colon cancer Neg Hx   . Esophageal cancer Neg Hx   . Rectal cancer Neg Hx   . Stomach cancer Neg Hx   . Vascular Disease Brother   . Alcoholism Paternal Grandfather   . Heart disease Paternal Grandmother   . Hypertension Mother   . Varicose Veins Mother   . Peripheral vascular disease Mother   . Heart disease Maternal Grandmother     Review of Systems  Constitutional: Positive for fatigue.  HENT: Positive for dental problem and rhinorrhea.   Eyes: Positive for photophobia and itching.  Respiratory: Positive for shortness of breath.   Cardiovascular: Negative.   Gastrointestinal: Negative.   Endocrine: Negative.   Genitourinary: Negative.   Musculoskeletal: Positive for back pain and gait problem.  Skin: Negative.   Allergic/Immunologic: Negative.   Neurological: Negative.   Hematological: Negative.   Psychiatric/Behavioral: Negative.       Objective:   Physical Exam  Constitutional: She is oriented to person, place, and time. Vital signs are normal. She appears well-developed and well-nourished. No distress.  Blood pressure 127/77, pulse 75, temperature 97.6 F (36.4 C), temperature source Oral, resp. rate 16, height _0  (1.499 m), weight 119 lb 12.8 oz (54.341 kg), SpO2 88 %.   HENT:  Head:  Normocephalic and atraumatic.  Right Ear: Hearing, external ear and ear canal normal. Tympanic membrane is scarred.  Left Ear: Hearing, external ear and ear canal normal. Tympanic membrane is scarred.  Nose: Nose normal. No rhinorrhea, nasal deformity or septal deviation. Right sinus exhibits no maxillary sinus tenderness and no frontal sinus tenderness. Left sinus exhibits no maxillary sinus tenderness and no frontal sinus tenderness.  Mouth/Throat: Uvula is midline and mucous membranes are normal. No oral lesions.  Abnormal dentition. Posterior oropharyngeal erythema present. No oropharyngeal exudate.  Eyes: Conjunctivae, EOM and lids are normal. Pupils are equal, round, and reactive to light. No scleral icterus.  Has periodic visits w/ eye care professional.  Neck: Trachea normal, full passive range of motion without pain and phonation normal. Neck supple. No JVD present. No spinous process tenderness and no muscular tenderness present. Carotid bruit is present. Decreased range of motion present. No thyroid mass and no thyromegaly present.  Cardiovascular: Normal rate, regular rhythm, S1 normal, S2 normal and normal pulses.   No extrasystoles are present. PMI is not displaced.   Murmur heard. Pulmonary/Chest: Effort normal. No respiratory distress. She has decreased breath sounds. She has no wheezes. She has no rhonchi. Right breast exhibits no inverted nipple, no mass, no nipple discharge, no skin change and no tenderness. Left breast exhibits no inverted nipple, no mass, no nipple discharge, no skin change and no tenderness. Breasts are symmetrical.  SpO2= 88%.  Abdominal: Soft. Normal appearance and bowel sounds are normal. She exhibits distension. She exhibits no pulsatile midline mass and no mass. There is no hepatosplenomegaly. There is no tenderness. There is no guarding and no CVA tenderness.  Genitourinary:  Deferred.  Musculoskeletal:       Cervical back: She exhibits decreased range of  motion and spasm. She exhibits no tenderness, no deformity and no pain.       Thoracic back: She exhibits decreased range of motion, deformity and spasm. She exhibits no tenderness.       Lumbar back: She exhibits decreased range of motion, deformity and spasm. She exhibits no tenderness and no bony tenderness.  Decreased ROM in all major joints; Heberden's nodes in hands/digits. DJD changes in wrists, knees and ankles/feet.  Lymphadenopathy:       Head (right side): No submental, no submandibular, no tonsillar, no preauricular, no posterior auricular and no occipital adenopathy present.       Head (left side): No submental, no submandibular, no tonsillar, no preauricular, no posterior auricular and no occipital adenopathy present.    She has no cervical adenopathy.    She has no axillary adenopathy.       Right: No inguinal and no supraclavicular adenopathy present.       Left: No inguinal and no supraclavicular adenopathy present.  Neurological: She is alert and oriented to person, place, and time. She has normal strength and normal reflexes. She displays no atrophy. No cranial nerve deficit or sensory deficit. She exhibits normal muscle tone. She displays a negative Romberg sign. Coordination and gait normal.  GET UP and GO test: time- 8 seconds. Gait is slow, slightly wide-based and mildly antalgic.  Skin: Skin is warm, dry and intact. No lesion, no petechiae and no rash noted. She is not diaphoretic. No cyanosis or erythema. No pallor. Nails show no clubbing.  Psychiatric: Her speech is normal and behavior is normal. Judgment and thought content normal. Cognition and memory are normal.  Nursing note and vitals reviewed.   Results for orders placed or performed in visit on 11/04/14  Result Value Ref Range  POCT urinalysis dipstick  Result Value Ref Range   Color, UA yellow    Clarity, UA clear    Glucose, UA neg    Bilirubin, UA neg    Ketones, UA neg    Spec Grav, UA <=1.005     Blood, UA trace    pH, UA 6.0    Protein, UA neg    Urobilinogen, UA  0.2    Nitrite, UA neg    Leukocytes, UA Negative       Assessment & Plan:  Medicare annual wellness visit, subsequent - Plan: POCT urinalysis dipstick  Dyspnea - Trial ProAir RespiClick 1-2 inhalations 4 times a day prn. Plan: Ambulatory referral to Pulmonology  COPD, mild - Plan: Ambulatory referral to Pulmonology. Pt will need formal PFTs.  Chronic pain syndrome  Chronic narcotic use - Pt is aware of our Controlled Substance Policy; pt is responsible and prudent with her use of prescribed narcotic.  Essential hypertension - Stable on current medications. Medication refills completed. Pt to follow up w/ Cardiologist and Vascular Surgeon as scheduled. Plan: COMPLETE METABOLIC PANEL WITH GFR  Dyslipidemia - Plan: COMPLETE METABOLIC PANEL WITH GFR, Lipid panel   Meds ordered this encounter  Medications  . Albuterol Sulfate (PROAIR RESPICLICK) 493 (90 BASE) MCG/ACT AEPB    Sig: Inhale 2 Act into the lungs 4 (four) times daily as needed.    Dispense:  1 each    Refill:  5  . escitalopram (LEXAPRO) 20 MG tablet    Sig: Take 1 tablet (20 mg total) by mouth daily.    Dispense:  90 tablet    Refill:  3  . atorvastatin (LIPITOR) 40 MG tablet    Sig: Take 1 tablet (40 mg total) by mouth daily.    Dispense:  90 tablet    Refill:  3  . metoprolol succinate (TOPROL-XL) 50 MG 24 hr tablet    Sig: Take 1 tablet (50 mg total) by mouth daily. with or immediately following a meal.    Dispense:  30 tablet    Refill:  5  . clopidogrel (PLAVIX) 75 MG tablet    Sig: Take 1 tablet (75 mg total) by mouth at bedtime.    Dispense:  90 tablet    Refill:  3  . HYDROcodone-acetaminophen (NORCO) 10-325 MG per tablet    Sig: Take 1/2 tablet every 8-12 hours as needed for pain.    Dispense:  30 tablet    Refill:  0    May fill on or after Nov 12, 2014.

## 2014-11-04 NOTE — Patient Instructions (Signed)
Chronic Obstructive Pulmonary Disease Chronic obstructive pulmonary disease (COPD) is a common lung problem. In COPD, the flow of air from the lungs is limited. The way your lungs work will probably never return to normal, but there are things you can do to improve your lungs and make yourself feel better. HOME CARE  Take all medicines as told by your doctor.  Avoid medicines or cough syrups that dry up your airway (such as antihistamines) and do not allow you to get rid of thick spit. You do not need to avoid them if told differently by your doctor.  If you smoke, stop. Smoking makes the problem worse.  Avoid being around things that make your breathing worse (like smoke, chemicals, and fumes).  Use oxygen therapy and therapy to help improve your lungs (pulmonary rehabilitation) if told by your doctor. If you need home oxygen therapy, ask your doctor if you should buy a tool to measure your oxygen level (oximeter).  Avoid people who have a sickness you can catch (contagious).  Avoid going outside when it is very hot, cold, or humid.  Eat healthy foods. Eat smaller meals more often. Rest before meals.  Stay active, but remember to also rest.  Make sure to get all the shots (vaccines) your doctor recommends. Ask your doctor if you need a pneumonia shot.  Learn and use tips on how to relax.  Learn and use tips on how to control your breathing as told by your doctor. Try:  Breathing in (inhaling) through your nose for 1 second. Then, pucker your lips and breath out (exhale) through your lips for 2 seconds.  Putting one hand on your belly (abdomen). Breathe in slowly through your nose for 1 second. Your hand on your belly should move out. Pucker your lips and breathe out slowly through your lips. Your hand on your belly should move in as you breathe out.  Learn and use controlled coughing to clear thick spit from your lungs. The steps are: 1. Lean your head a little forward. 2. Breathe  in deeply. 3. Try to hold your breath for 3 seconds. 4. Keep your mouth slightly open while coughing 2 times. 5. Spit any thick spit out into a tissue. 6. Rest and do the steps again 1 or 2 times as needed. GET HELP IF:  You cough up more thick spit than usual.  There is a change in the color or thickness of the spit.  It is harder to breathe than usual.  Your breathing is faster than usual. GET HELP RIGHT AWAY IF:   You have shortness of breath while resting.  You have shortness of breath that stops you from:  Being able to talk.  Doing normal activities.  You chest hurts for longer than 5 minutes.  Your skin color is more blue than usual.  Your pulse oximeter shows that you have low oxygen for longer than 5 minutes. MAKE SURE YOU:   Understand these instructions.  Will watch your condition.  Will get help right away if you are not doing well or get worse. Document Released: 11/21/2007 Document Revised: 10/19/2013 Document Reviewed: 01/29/2013 [facility] Patient Information 2015 [place], [place]. This information is not intended to replace advice given to you by your health care provider. Make sure you discuss any questions you have with your health care provider.

## 2014-11-05 NOTE — Progress Notes (Signed)
Quick Note:  Please notify pt that results are normal.   Provide pt with copy of labs. ______

## 2014-11-06 ENCOUNTER — Encounter: Payer: Self-pay | Admitting: Family Medicine

## 2014-11-08 ENCOUNTER — Telehealth: Payer: Self-pay

## 2014-11-08 NOTE — Telephone Encounter (Signed)
Patient would like a call back concerning her lab work that was done.

## 2014-11-08 NOTE — Telephone Encounter (Signed)
Gave pt lab results.

## 2014-11-18 ENCOUNTER — Institutional Professional Consult (permissible substitution): Payer: Self-pay | Admitting: Emergency Medicine

## 2014-11-23 ENCOUNTER — Ambulatory Visit (INDEPENDENT_AMBULATORY_CARE_PROVIDER_SITE_OTHER): Payer: Medicare Other | Admitting: Internal Medicine

## 2014-11-23 ENCOUNTER — Ambulatory Visit (INDEPENDENT_AMBULATORY_CARE_PROVIDER_SITE_OTHER)
Admission: RE | Admit: 2014-11-23 | Discharge: 2014-11-23 | Disposition: A | Discharge status: Home / Self Care | Payer: Medicare Other | Source: Ambulatory Visit | Attending: Internal Medicine | Admitting: Internal Medicine

## 2014-11-23 ENCOUNTER — Encounter: Payer: Self-pay | Admitting: Internal Medicine

## 2014-11-23 VITALS — BP 124/74 | HR 76 | Ht 59.0 in | Wt 121.0 lb

## 2014-11-23 DIAGNOSIS — R0600 Dyspnea, unspecified: Secondary | ICD-10-CM | POA: Diagnosis not present

## 2014-11-23 DIAGNOSIS — J8410 Pulmonary fibrosis, unspecified: Secondary | ICD-10-CM | POA: Insufficient documentation

## 2014-11-23 NOTE — Patient Instructions (Addendum)
Please remember to go to the   x-ray department downstairs for your tests - we will call you with the results when they are available and set up additional studies if needed   You will need to walk at a slow pace or consider ambulatory 02   Please schedule a follow up office visit in 6 weeks, call sooner if needed with pfts on return  Late add:  Needs CTa

## 2014-11-23 NOTE — Progress Notes (Signed)
   Subjective:    Patient ID: Cristina Parker, female    DOB: 09/08/1935,   MRN: [id number]  HPI  35 yowf quit smoking 1998 p CAP completely recovered with good activity tolerance but no aerobics then worse since 2010 p hip surgery with fatigue then around 2013  referred by Dr Leward Quan for copd evaluation 11/23/2014    11/23/2014 1st [facility] office visit/ Smaran Gaus   Chief Complaint  Patient presents with  . Pulmonary Consult    Referred by Dr. Ellsworth Lennox. Pt c/o SOB for the past 2-3 yrs- progressively getting worse. She gets SOB walking from room to room at home.   indolent onset gradually progressive  doe / fatigue s cough or variability x sev years and no better with inhalers to date  (? All saba's?)   No obvious day to day or daytime variabilty or assoc chronic cough or cp or chest tightness, subjective wheeze overt sinus or hb symptoms. No unusual exp hx or h/o childhood pna/ asthma or knowledge of premature birth.  Sleeping ok without nocturnal  or early am exacerbation  of respiratory  c/o's or need for noct saba. Also denies any obvious fluctuation of symptoms with weather or environmental changes or other aggravating or alleviating factors except as outlined above   Current Medications, Allergies, Complete Past Medical History, Past Surgical History, Family History, and Social History were reviewed in [employer] record.              Review of Systems  Constitutional: Negative for fever, chills and unexpected weight change.  HENT: Positive for rhinorrhea. Negative for congestion, dental problem, ear pain, nosebleeds, postnasal drip, sinus pressure, sneezing, sore throat, trouble swallowing and voice change.   Eyes: Negative for visual disturbance.  Respiratory: Positive for shortness of breath. Negative for cough and choking.   Cardiovascular: Negative for chest pain and leg swelling.  Gastrointestinal: Negative for vomiting, abdominal pain and  diarrhea.  Genitourinary: Negative for difficulty urinating.  Musculoskeletal: Positive for arthralgias.  Skin: Negative for rash.  Neurological: Negative for tremors, syncope and headaches.  Hematological: Does not bruise/bleed easily.       Objective:   Physical Exam  amb wf nad   Wt Readings from Last 3 Encounters:  11/23/14 121 lb (54.885 kg)  11/04/14 119 lb 12.8 oz (54.341 kg)  04/09/14 114 lb (51.71 kg)    Vital signs reviewed  HEENT: nl dentition, turbinates, and orophanx. Nl external ear canals without cough reflex   NECK :  without JVD/Nodes/TM/ nl carotid upstrokes bilaterally   LUNGS: no acc muscle use, slt barrel chest, slt distant bs    CV:  RRR  no s3 or murmur or increase in P2, no edema   ABD:  soft and nontender with pos hoover end insp in the supine position. No bruits or organomegaly, bowel sounds nl  MS:  warm without deformities, calf tenderness, cyanosis or clubbing  SKIN: warm and dry without lesions    NEURO:  alert, approp, no deficits     CXR PA and Lateral:   11/23/2014 :     I personally reviewed images and agree with radiology impression as follows:      1. There is no active cardiopulmonary disease. 2. COPD. Chronic right upper lobe scarring.        Assessment & Plan:

## 2014-11-24 ENCOUNTER — Telehealth: Payer: Self-pay | Admitting: *Deleted

## 2014-11-24 ENCOUNTER — Encounter: Payer: Self-pay | Admitting: Internal Medicine

## 2014-11-24 ENCOUNTER — Ambulatory Visit (INDEPENDENT_AMBULATORY_CARE_PROVIDER_SITE_OTHER)
Admission: RE | Admit: 2014-11-24 | Discharge: 2014-11-24 | Disposition: A | Discharge status: Home / Self Care | Payer: Medicare Other | Source: Ambulatory Visit | Attending: Internal Medicine | Admitting: Internal Medicine

## 2014-11-24 DIAGNOSIS — R0600 Dyspnea, unspecified: Secondary | ICD-10-CM

## 2014-11-24 MED ORDER — IOHEXOL 350 MG/ML SOLN
80.0000 mL | Freq: Once | INTRAVENOUS | Status: AC | PRN
  Administered 2014-11-24: 80 mL via INTRAVENOUS

## 2014-11-24 NOTE — Telephone Encounter (Signed)
-----  Message from Tanda Rockers, MD sent at 11/24/2014  6:59 AM EDT ----- Needs CTa this week eval sob /ex hypoxemia

## 2014-11-24 NOTE — Telephone Encounter (Signed)
Spoke with the pt and notified of recs per MW  She verbalized understanding  STAT order was sent to [facility]

## 2014-11-24 NOTE — Assessment & Plan Note (Addendum)
-  09/09/13 Echo Normal LV size and systolic function, EF 44-03%. Moderate diastolic dysfunction. Normal RV size and systolic function. No significant valvular abnormalities. Left atrial enlargement. Mild pulmonary hypertension. - 11/23/2014  Walked RA x 1 laps @ 185 ft each stopped due to fatigue/ not sob/ but sats down to 85%  - 11/23/2014 spriometry: no obst/ restrictive changes only   I had an extended discussion with the patient reviewing all relevant studies completed to date and  lasting  35 minute visit on the following ongoing concerns:   1) there is no clear reason why she should be desaturating but note she is more fatigued than sob, an unusual feature  2) needs CTa to eval for ILD/ occult PE and return for full pfts  3) in meantime declined to consider portable 02 so will need to walk at slow pace to avoid desat   4) have also be developing worse PH related to diast dysfunction, another reason to avoid desaturations  Each maintenance medication was reviewed in detail including most importantly the difference between maintenance and as needed and under what circumstances the prns are to be used.  Please see instructions for details which were reviewed in writing and the patient given a copy.

## 2014-11-25 NOTE — Progress Notes (Signed)
Quick Note:  Spoke with pt and notified of results per Dr. Wert. Pt verbalized understanding and denied any questions.  ______ 

## 2014-12-13 ENCOUNTER — Telehealth: Payer: Self-pay

## 2014-12-13 MED ORDER — HYDROCODONE-ACETAMINOPHEN 10-325 MG PO TABS: ORAL_TABLET | ORAL | Status: DC

## 2014-12-13 NOTE — Telephone Encounter (Signed)
Spoke with pt, advised message from Judson Roch. Pt has chosen Dr. Lorelei Pont as her PCP.

## 2014-12-13 NOTE — Telephone Encounter (Signed)
Ready.  Please let pt know that Dr Leward Quan has retired and she will need to establish with a new PCP for her medications

## 2014-12-13 NOTE — Telephone Encounter (Signed)
Pt requesting Vicodin Refill   Best phone for pt is [phone]

## 2015-01-04 ENCOUNTER — Ambulatory Visit (INDEPENDENT_AMBULATORY_CARE_PROVIDER_SITE_OTHER): Payer: Medicare Other | Admitting: Internal Medicine

## 2015-01-04 ENCOUNTER — Encounter: Payer: Self-pay | Admitting: Internal Medicine

## 2015-01-04 DIAGNOSIS — R0600 Dyspnea, unspecified: Secondary | ICD-10-CM

## 2015-01-04 LAB — PULMONARY FUNCTION TEST
DL/VA % pred: 69 %
DL/VA: 2.84 ml/min/mmHg/L
DLCO UNC % PRED: 45 %
DLCO UNC: 7.97 ml/min/mmHg
FEF 25-75 Post: 1.02 L/sec
FEF 25-75 Pre: 0.97 L/sec
FEF2575-%CHANGE-POST: 5 %
FEF2575-%PRED-PRE: 76 %
FEF2575-%Pred-Post: 80 %
FEV1-%Change-Post: 2 %
FEV1-%Pred-Post: 70 %
FEV1-%Pred-Pre: 68 %
FEV1-PRE: 1.07 L
FEV1-Post: 1.1 L
FEV1FVC-%CHANGE-POST: 3 %
FEV1FVC-%Pred-Pre: 107 %
FEV6-%CHANGE-POST: 0 %
FEV6-%PRED-PRE: 65 %
FEV6-%Pred-Post: 65 %
FEV6-POST: 1.31 L
FEV6-PRE: 1.31 L
FEV6FVC-%Change-Post: 2 %
FEV6FVC-%PRED-PRE: 103 %
FEV6FVC-%Pred-Post: 106 %
FVC-%CHANGE-POST: 0 %
FVC-%Pred-Post: 63 %
FVC-%Pred-Pre: 63 %
FVC-Post: 1.34 L
FVC-Pre: 1.35 L
POST FEV1/FVC RATIO: 82 %
POST FEV6/FVC RATIO: 100 %
PRE FEV1/FVC RATIO: 79 %
PRE FEV6/FVC RATIO: 97 %
RV % PRED: 105 %
RV: 2.2 L
TLC % pred: 96 %
TLC: 4.14 L

## 2015-01-04 NOTE — Progress Notes (Signed)
Subjective:    Patient ID: Cristina Parker, female    DOB: 1936/06/14,   MRN: [id number]   Brief patient profile:  58 yowf with h/o UC  quit smoking 1998 p CAP completely recovered with good activity tolerance but no aerobics then worse since 2010 p hip surgery with fatigue then around 2013  referred by Dr Leward Quan for copd evaluation 11/23/2014.    History of Present Illness  11/23/2014 1st [facility] office visit/ Ahnya Akre   Chief Complaint  Patient presents with  . Pulmonary Consult    Referred by Dr. Ellsworth Lennox. Pt c/o SOB for the past 2-3 yrs- progressively getting worse. She gets SOB walking from room to room at home.   indolent onset gradually progressive  doe / fatigue s cough or variability x sev years and no better with inhalers to date  (? All saba's?)  rec You will need to walk at a slow pace or consider ambulatory 02       01/04/2015 f/u ov/Markevion Lattin re:  Sob/ desats ? Etiology ? Developing PF? Chief Complaint  Patient presents with  . Follow-up    PFT done today. Pt states her breathing is unchanged. No new co's today.   uses cart at [facility] / uses [facility] parking  occ am cough  Sleeps ok  No better with albuterol    No obvious day to day or daytime variability or assoc chronic cough or cp or chest tightness, subjective wheeze or overt sinus or hb symptoms. No unusual exp hx or h/o childhood pna/ asthma or knowledge of premature birth.  Sleeping ok without nocturnal  or early am exacerbation  of respiratory  c/o's or need for noct saba. Also denies any obvious fluctuation of symptoms with weather or environmental changes or other aggravating or alleviating factors except as outlined above   Current Medications, Allergies, Complete Past Medical History, Past Surgical History, Family History, and Social History were reviewed in [employer] record.  ROS  The following are not active complaints unless bolded sore throat, dysphagia, dental problems,  itching, sneezing,  nasal congestion or excess/ purulent secretions, ear ache,   fever, chills, sweats, unintended wt loss, classically pleuritic or exertional cp, hemoptysis,  orthopnea pnd or leg swelling, presyncope, palpitations, abdominal pain, anorexia, nausea, vomiting, diarrhea  or change in bowel or bladder habits, change in stools or urine, dysuria,hematuria,  rash, arthralgias, visual complaints, headache, numbness, weakness or ataxia or problems with walking or coordination,  change in mood/affect or memory.            Objective:   Physical Exam  amb wf nad   01/04/2015      122 Wt Readings from Last 3 Encounters:  11/23/14 121 lb (54.885 kg)  11/04/14 119 lb 12.8 oz (54.341 kg)  04/09/14 114 lb (51.71 kg)    Vital signs reviewed  HEENT: nl dentition, turbinates, and orophanx. Nl external ear canals without cough reflex   NECK :  without JVD/Nodes/TM/ nl carotid upstrokes bilaterally   LUNGS: no acc muscle use, slt barrel chest, slt distant bs with minimal crackles bases    CV:  RRR  no s3 or murmur or increase in P2, no edema   ABD:  soft and nontender with pos hoover end insp in the supine position. No bruits or organomegaly, bowel sounds nl  MS:  warm without deformities, calf tenderness, cyanosis or clubbing  SKIN: warm and dry without lesions    NEURO:  alert, approp,  no deficits       I personally reviewed images and agree with radiology impression as follows:  CTa chest 11/24/14 No evidence of acute pulmonary thromboembolism. Patchy pulmonary opacities at the left base have developed worrisome for an inflammatory process.         Assessment & Plan:

## 2015-01-04 NOTE — Progress Notes (Signed)
PFT done today.

## 2015-01-04 NOTE — Patient Instructions (Addendum)
Please see patient coordinator before you leave today  to schedule high resolution CT chest  Late add ?NSIP on hrct > rec 3 m f/u

## 2015-01-06 ENCOUNTER — Ambulatory Visit (INDEPENDENT_AMBULATORY_CARE_PROVIDER_SITE_OTHER)
Admission: RE | Admit: 2015-01-06 | Discharge: 2015-01-06 | Disposition: A | Discharge status: Home / Self Care | Payer: Medicare Other | Source: Ambulatory Visit | Attending: Internal Medicine | Admitting: Internal Medicine

## 2015-01-06 ENCOUNTER — Encounter: Payer: Self-pay | Admitting: Internal Medicine

## 2015-01-06 DIAGNOSIS — R0600 Dyspnea, unspecified: Secondary | ICD-10-CM | POA: Diagnosis not present

## 2015-01-06 NOTE — Assessment & Plan Note (Signed)
-  09/09/13 Echo Normal LV size and systolic function, EF 27-61%. Moderate diastolic dysfunction. Normal RV size and systolic function. No significant valvular abnormalities. Left atrial enlargement. Mild pulmonary hypertension. - 11/23/2014  Walked RA x 1 lap  @ 185 ft each stopped due to fatigue/ not sob/ but sats down to 85%  - 11/23/2014 spirometry: no obst/ restrictive changes only  - PFTs 01/04/15  VC 1.95 (91%) no obst dlco 45 corrects to 69%   Her chronic symptoms and desats plus the changes on CTa are worrisome for alv cell ca or ild assoc with UC but are certainly not suggestive of PE or pna or any other infectious process so the next logical step is HRCT then consider fob if needed.   I had an extended discussion with the patient reviewing all relevant studies completed to date and  lasting 15 to 20 minutes of a 25 minute visit    Each maintenance medication was reviewed in detail including most importantly the difference between maintenance and prns and under what circumstances the prns are to be triggered using an action plan format that is not reflected in the computer generated alphabetically organized AVS.    Please see instructions for details which were reviewed in writing and the patient given a copy highlighting the part that I personally wrote and discussed at today's ov.

## 2015-01-06 NOTE — Progress Notes (Signed)
Quick Note:  Called home number- Line busy Called mobile number and NA, no option to leave msg, [facility] ______

## 2015-01-12 ENCOUNTER — Telehealth: Payer: Self-pay | Admitting: Internal Medicine

## 2015-01-12 NOTE — Telephone Encounter (Signed)
Result Notes     Notes Recorded by Rosana Berger, CMA on 01/06/2015 at 3:03 PM Called home number- Line busy Called mobile number and NA, no option to leave msg, [facility] ------  Notes Recorded by Tanda Rockers, MD on 01/06/2015 at 1:30 PM Call patient : Study suggests she had an area of previous pna but no need for abx now > rec f/u 3 m with plain cxr at [facility] but call sooner if worse sob or cough     Attempted to call pt. No answer, no option to leave a message. Will try back.

## 2015-01-14 ENCOUNTER — Telehealth: Payer: Self-pay | Admitting: Internal Medicine

## 2015-01-14 NOTE — Telephone Encounter (Signed)
Notes Recorded by Tanda Rockers, MD on 01/06/2015 at 1:30 PM Call patient : Study suggests she had an area of previous pna but no need for abx now > rec f/u 3 m with plain cxr at [facility] but call sooner if worse sob or cough(re: CT chest high resolution)  Pt aware of results/recs.  MW schedule not opened 3 mos out, so recall placed.  Nothing further needed at this time.

## 2015-01-17 ENCOUNTER — Telehealth: Payer: Self-pay

## 2015-01-17 MED ORDER — HYDROCODONE-ACETAMINOPHEN 10-325 MG PO TABS: ORAL_TABLET | ORAL | Status: DC

## 2015-01-17 NOTE — Telephone Encounter (Signed)
Pt has an appt with Dr. Lorelei Pont. 9/21

## 2015-01-17 NOTE — Telephone Encounter (Signed)
Pt in need of her VICODIN. Please call [phone] when ready for pick up

## 2015-01-17 NOTE — Telephone Encounter (Signed)
Rx printed.  Meds ordered this encounter  Medications  . HYDROcodone-acetaminophen (NORCO) 10-325 MG per tablet    Sig: Take 1/2 tablet every 8-12 hours as needed for pain.    Dispense:  30 tablet    Refill:  0    Order Specific Question:  Supervising Provider    Answer:  DOOLITTLE, ROBERT P [[id number]]

## 2015-01-18 NOTE — Telephone Encounter (Signed)
Rx ready to pick up. Pt notified.

## 2015-01-22 ENCOUNTER — Ambulatory Visit (INDEPENDENT_AMBULATORY_CARE_PROVIDER_SITE_OTHER): Payer: Medicare Other | Admitting: Emergency Medicine

## 2015-01-22 VITALS — BP 140/82 | HR 94 | Temp 98.2°F | Resp 20 | Ht 59.5 in | Wt 121.6 lb

## 2015-01-22 DIAGNOSIS — K047 Periapical abscess without sinus: Secondary | ICD-10-CM | POA: Diagnosis not present

## 2015-01-22 MED ORDER — CEPHALEXIN 500 MG PO CAPS: 500.0000 mg | ORAL_CAPSULE | Freq: Four times a day (QID) | ORAL | Status: DC

## 2015-01-22 NOTE — Progress Notes (Signed)
Subjective:  Patient ID: Cristina Parker, female    DOB: [birth year]  Age: 79 y.o. MRN: [id number]  CC: Dental Pain   HPI Cristina Parker presents  with mouth pain. She has swelling in the gums and badly decayed central maxillary incisors.. There broke it off at the gumline. He has an appointment with her dentist on Monday and has been taking Vicodin for pain. She has no fever chills. No other complaint.  History Cristina Parker has a past medical history of Heart disease; Crohn's disease; Duodenal stricture; Esophageal reflux; Hypertension; Hyperlipemia; Hiatal hernia; Pneumonia; Anemia; Depression; Collagenous colitis; Anxiety; Paroxysmal a-fib (07/09/2011); Peripheral vascular disease; Blood transfusion without reported diagnosis; Duodenal ulcer; Arthritis; Cataract; Heart murmur; and Osteoporosis.   She has past surgical history that includes Coronary artery bypass graft; Hip surgery; Abdominal hysterectomy; Cervical disc surgery; Bunionectomy (1977); Fracture surgery (1998); Eye surgery (10/2010); Carotid endarterectomy (81-1031); Carotid endarterectomy (04/11/11); Aorto- innominate BPG (2005); Colonoscopy; Cesarean section; and Tubal ligation.   Her  family history includes Alcoholism in her paternal grandfather; Colon polyps in her father; Deep vein thrombosis in her father; Heart disease in her father, maternal grandmother, and paternal grandmother; Hypertension in her mother; Peripheral vascular disease in her mother; Stroke in her father; Varicose Veins in her mother; Vascular Disease in her brother. There is no history of Colon cancer, Esophageal cancer, Rectal cancer, or Stomach cancer.  She   reports that she quit smoking about 18 years ago. Her smoking use included Cigarettes. She has a 35 pack-year smoking history. She has never used smokeless tobacco. She reports that she drinks about 4.2 - 8.4 oz of alcohol per week. She reports that she does not use illicit drugs.  Outpatient  Prescriptions Prior to Visit  Medication Sig Dispense Refill  . Ascorbic Acid (VITAMIN C) 1000 MG tablet Take 1,000 mg by mouth daily.    Marland Kitchen aspirin 81 MG tablet Take 81 mg by mouth every morning.     Marland Kitchen atorvastatin (LIPITOR) 40 MG tablet Take 1 tablet (40 mg total) by mouth daily. 90 tablet 3  . Calcium Citrate-Vitamin D 250-200 MG-UNIT TABS Take 2 tablets by mouth daily.      . Chlorphen-Pyril-Phenyleph (PHENA-PLUS PO) Take 1 tablet by mouth daily.    . clopidogrel (PLAVIX) 75 MG tablet Take 1 tablet (75 mg total) by mouth at bedtime. 90 tablet 3  . diphenhydrAMINE (SOMINEX) 25 MG tablet Take 25 mg by mouth at bedtime as needed for allergies.    Marland Kitchen escitalopram (LEXAPRO) 20 MG tablet Take 1 tablet (20 mg total) by mouth daily. 90 tablet 3  . HYDROcodone-acetaminophen (NORCO) 10-325 MG per tablet Take 1/2 tablet every 8-12 hours as needed for pain. 30 tablet 0  . LORazepam (ATIVAN) 0.5 MG tablet TAKE 1/2 TABLET BY MOUTH TWICE DAILY AS NEEDED FOR ANXIETY 30 tablet 2  . metoprolol succinate (TOPROL-XL) 50 MG 24 hr tablet Take 1 tablet (50 mg total) by mouth daily. with or immediately following a meal. 30 tablet 5  . pantoprazole (PROTONIX) 40 MG tablet Take 40 mg by mouth daily.    . Albuterol Sulfate (PROAIR RESPICLICK) 594 (90 BASE) MCG/ACT AEPB Inhale 2 Act into the lungs 4 (four) times daily as needed. (Patient not taking: Reported on 01/22/2015) 1 each 5   No facility-administered medications prior to visit.    History   Social History  . Marital Status: Widowed    Spouse Name: N/A  . Number of Children: 2  . Years  of Education: N/A   Occupational History  . retired Therapist, sports    Social History Main Topics  . Smoking status: Former Smoker -- 1.00 packs/day for 35 years    Types: Cigarettes    Quit date: 06/18/1996  . Smokeless tobacco: Never Used  . Alcohol Use: 4.2 - 8.4 oz/week    7-14 Standard drinks or equivalent per week     Comment: WINE  . Drug Use: No  . Sexual Activity: No    Other Topics Concern  . None   Social History Narrative   Retired Marine scientist;   Lives with son and grandson     Review of Systems  Constitutional: Negative for fever, chills and appetite change.  HENT: Negative for congestion, ear pain, postnasal drip, sinus pressure and sore throat.   Eyes: Negative for pain and redness.  Respiratory: Negative for cough, shortness of breath and wheezing.   Cardiovascular: Negative for leg swelling.  Gastrointestinal: Negative for nausea, vomiting, abdominal pain, diarrhea, constipation and blood in stool.  Endocrine: Negative for polyuria.  Genitourinary: Negative for dysuria, urgency, frequency and flank pain.  Musculoskeletal: Negative for gait problem.  Skin: Negative for rash.  Neurological: Negative for weakness and headaches.  Psychiatric/Behavioral: Negative for confusion and decreased concentration. The patient is not nervous/anxious.     Objective:  BP 140/82 mmHg  Pulse 94  Temp(Src) 98.2 F (36.8 C) (Oral)  Resp 20  Ht 4' 11.5" (1.511 m)  Wt 121 lb 9.6 oz (55.157 kg)  BMI 24.16 kg/m2  SpO2 86%  Physical Exam  Constitutional: She is oriented to person, place, and time. She appears well-developed and well-nourished.  HENT:  Head: Normocephalic and atraumatic.  Mouth/Throat: Abnormal dentition. Dental abscesses and dental caries present.  Eyes: Conjunctivae are normal. Pupils are equal, round, and reactive to light.  Pulmonary/Chest: Effort normal.  Musculoskeletal: She exhibits no edema.  Neurological: She is alert and oriented to person, place, and time.  Skin: Skin is dry.  Psychiatric: She has a normal mood and affect. Her behavior is normal. Thought content normal.      Assessment & Plan:   Cristina Parker was seen today for dental pain.  Diagnoses and all orders for this visit:  Periapical abscess without sinus  Other orders -     cephALEXin (KEFLEX) 500 MG capsule; Take 1 capsule (500 mg total) by mouth 4 (four) times  daily.   I am having Cristina Parker start on cephALEXin. I am also having her maintain her aspirin, Calcium Citrate-Vitamin D, LORazepam, vitamin C, Albuterol Sulfate, escitalopram, atorvastatin, metoprolol succinate, clopidogrel, pantoprazole, Chlorphen-Pyril-Phenyleph (PHENA-PLUS PO), diphenhydrAMINE, and HYDROcodone-acetaminophen.  Meds ordered this encounter  Medications  . cephALEXin (KEFLEX) 500 MG capsule    Sig: Take 1 capsule (500 mg total) by mouth 4 (four) times daily.    Dispense:  40 capsule    Refill:  0    Appropriate red flag conditions were discussed with the patient as well as actions that should be taken.  Patient expressed his understanding.  Follow-up: Return if symptoms worsen or fail to improve.  Roselee Culver, MD

## 2015-01-22 NOTE — Patient Instructions (Signed)
Dental Abscess A dental abscess is a collection of infected fluid (pus) from a bacterial infection in the inner part of the tooth (pulp). It usually occurs at the end of the tooth's root.  CAUSES   Severe tooth decay.  Trauma to the tooth that allows bacteria to enter into the pulp, such as a broken or chipped tooth. SYMPTOMS   Severe pain in and around the infected tooth.  Swelling and redness around the abscessed tooth or in the mouth or face.  Tenderness.  Pus drainage.  Bad breath.  Bitter taste in the mouth.  Difficulty swallowing.  Difficulty opening the mouth.  Nausea.  Vomiting.  Chills.  Swollen neck glands. DIAGNOSIS   A medical and dental history will be taken.  An examination will be performed by tapping on the abscessed tooth.  X-rays may be taken of the tooth to identify the abscess. TREATMENT The goal of treatment is to eliminate the infection. You may be prescribed antibiotic medicine to stop the infection from spreading. A root canal may be performed to save the tooth. If the tooth cannot be saved, it may be pulled (extracted) and the abscess may be drained.  HOME CARE INSTRUCTIONS  Only take over-the-counter or prescription medicines for pain, fever, or discomfort as directed by your caregiver.  Rinse your mouth (gargle) often with salt water ( tsp salt in 8 oz [250 ml] of warm water) to relieve pain or swelling.  Do not drive after taking pain medicine (narcotics).  Do not apply heat to the outside of your face.  Return to your dentist for further treatment as directed. SEEK MEDICAL CARE IF:  Your pain is not helped by medicine.  Your pain is getting worse instead of better. SEEK IMMEDIATE MEDICAL CARE IF:  You have a fever or persistent symptoms for more than 2-3 days.  You have a fever and your symptoms suddenly get worse.  You have chills or a very bad headache.  You have problems breathing or swallowing.  You have trouble  opening your mouth.  You have swelling in the neck or around the eye. Document Released: 06/04/2005 Document Revised: 02/27/2012 Document Reviewed: 09/12/2010 [facility] Patient Information 2015 [place], [place]. This information is not intended to replace advice given to you by your health care provider. Make sure you discuss any questions you have with your health care provider.

## 2015-01-25 ENCOUNTER — Other Ambulatory Visit: Payer: Self-pay | Admitting: Family Medicine

## 2015-01-25 NOTE — Telephone Encounter (Signed)
Refill provided. Please advise patient that she has increased risk of delirium with this medication which is a condition that causes altered state of consciousness, confusion, irritability. I know this was being managed by Dr. Leward Quan previously but she should come in for re-evaluation and management of these medications since her medication list also includes high dose opioid. Thank you!

## 2015-01-25 NOTE — Telephone Encounter (Signed)
Faxed Rx. Called and advised pt of Mike's warnings and she has appt on Sept to est care w/Dr Copland.

## 2015-02-07 ENCOUNTER — Encounter: Payer: Self-pay | Admitting: Family Medicine

## 2015-02-18 ENCOUNTER — Telehealth: Payer: Self-pay | Admitting: Family Medicine

## 2015-02-18 MED ORDER — HYDROCODONE-ACETAMINOPHEN 10-325 MG PO TABS: ORAL_TABLET | ORAL | Status: DC

## 2015-02-18 NOTE — Telephone Encounter (Signed)
Rx in pick up draw.

## 2015-02-18 NOTE — Telephone Encounter (Signed)
Pt notified.

## 2015-02-18 NOTE — Addendum Note (Signed)
Addended by: Jaynee Eagles on: 02/18/2015 12:16 PM   Modules accepted: Orders

## 2015-02-18 NOTE — Telephone Encounter (Signed)
Refill on Hydrocodone.   [phone]

## 2015-02-18 NOTE — Telephone Encounter (Signed)
I do not recommend such high doses of opioid pain medication especially in geriatric patients due risk of delirium, falls as potential adverse effects of this medication. Given that she is not my patient and is being managed by another provider, I will give a one time refill so that she can make her appointment on 09/21 as per our controlled substance policy. In the future, patient must request refill from the original prescriber or PCP.

## 2015-02-18 NOTE — Telephone Encounter (Signed)
Pt has an appt with Dr. Lorelei Pont on 9/21. Can we refill?

## 2015-03-07 ENCOUNTER — Ambulatory Visit: Payer: Medicare Other | Admitting: Family Medicine

## 2015-03-09 ENCOUNTER — Ambulatory Visit (INDEPENDENT_AMBULATORY_CARE_PROVIDER_SITE_OTHER): Payer: Medicare Other | Admitting: Family Medicine

## 2015-03-09 ENCOUNTER — Encounter: Payer: Self-pay | Admitting: Family Medicine

## 2015-03-09 VITALS — BP 107/66 | HR 74 | Temp 98.1°F | Resp 16 | Ht 60.0 in | Wt 123.2 lb

## 2015-03-09 DIAGNOSIS — R0902 Hypoxemia: Secondary | ICD-10-CM | POA: Diagnosis not present

## 2015-03-09 DIAGNOSIS — M810 Age-related osteoporosis without current pathological fracture: Secondary | ICD-10-CM | POA: Diagnosis not present

## 2015-03-09 DIAGNOSIS — Z23 Encounter for immunization: Secondary | ICD-10-CM

## 2015-03-09 DIAGNOSIS — F411 Generalized anxiety disorder: Secondary | ICD-10-CM | POA: Diagnosis not present

## 2015-03-09 DIAGNOSIS — G8929 Other chronic pain: Secondary | ICD-10-CM

## 2015-03-09 DIAGNOSIS — M549 Dorsalgia, unspecified: Secondary | ICD-10-CM | POA: Diagnosis not present

## 2015-03-09 MED ORDER — LORAZEPAM 0.5 MG PO TABS: ORAL_TABLET | ORAL | Status: DC

## 2015-03-09 MED ORDER — HYDROCODONE-ACETAMINOPHEN 10-325 MG PO TABS: ORAL_TABLET | ORAL | Status: DC

## 2015-03-09 NOTE — Progress Notes (Signed)
[facility] [address] [place] [address] [phone]  Date:  03/09/2015   Name:  Cristina Parker   DOB:  1935/12/08   MRN:  [id number]  PCP:  Ellsworth Lennox, MD    Chief Complaint: Hypertension and Shortness of Breath   History of Present Illness:  Cristina Parker is a 79 y.o. very pleasant female patient who presents with the following:  Here today for a follow- up visit; former pt of my partner Dr. Leward Quan who is now retired.  She is a pt of Dr. Melvyn Novas for her pulmonology concerns, last visit was in July  Normal echo 3/15.  Pt of N[facility], will see him in November   She is a chronic pain pt as well which we are managing- reviewed controlled substance database which does not show any Rx written outside of our practice  She quit smoking in 1998.    She has an rx for albuterol inhaler but notes that she does not use it frequently  She feels that her sx are acutely stable, but that her SOB is gradually getting worse over the last 12- 18 months.   She is able to walk across her home, but then will feel SOB and need to rest.  She is not able to do much walking outside the home When sitting or laying down she does not feel SOB She is not on oxygen at home.   She and Dr. Melvyn Novas have talked about this but had not yet decided to go ahead with it; however at this time she is interested in thinking about this more  No recent illness.   She is s/p prevnar and pneumovax after age 71  She has a history of chronic back pain- if she walks some she will have pain. She has osteoporosis.  She takes hydrocodone 10 1/2 tablet BID  Would like to do a flu shot today She also uses ativan for chronic anxiety- 0.5 BID prn  SpO2 Readings from Last 3 Encounters:  03/09/15 83%  01/22/15 86%  01/04/15 90%     Patient Active Problem List   Diagnosis Date Noted  . Dyspnea 11/23/2014  . Chronic pain syndrome 11/04/2014  . Carotid stenosis 04/06/2014  . Aftercare  following surgery of the circulatory system 04/06/2014  . Cataract 09/25/2012  . Arthritis 09/25/2012  . Depression 09/25/2012  . Osteoporosis, unspecified 09/25/2012  . Duodenal stricture 09/16/2012  . Murmur 12/19/2011  . Occlusion and stenosis of carotid artery without mention of cerebral infarction 11/15/2011  . Paroxysmal a-fib 07/09/2011  . COPD (chronic obstructive pulmonary disease) 07/03/2011  . OTHER ULCERATIVE COLITIS 08/09/2009  . PVD 10/31/2007  . HIATAL HERNIA 10/31/2007  . Other and unspecified hyperlipidemia 07/18/2007  . Essential hypertension 07/18/2007  . CORONARY HEART DISEASE 07/18/2007  . VENTRICULAR ARRHYTHMIA 07/18/2007  . LUNG NODULE 07/18/2007  . G E R D 07/18/2007  . CAROTID ENDARTERECTOMY, LEFT, HX OF 07/18/2007    Past Medical History  Diagnosis Date  . Heart disease   . Crohn's disease   . Duodenal stricture   . Esophageal reflux   . Hypertension   . Hyperlipemia   . Hiatal hernia   . Pneumonia   . Anemia   . Depression   . Collagenous colitis   . Anxiety   . Paroxysmal a-fib 07/09/2011  . Peripheral vascular disease   . Blood transfusion without reported diagnosis   . Duodenal ulcer   . Arthritis   .  Cataract     BILATERAL  . Heart murmur   . Osteoporosis     Past Surgical History  Procedure Laterality Date  . Coronary artery bypass graft    . Hip surgery      right  . Abdominal hysterectomy    . Cervical disc surgery    . Bunionectomy  1977    both feet   . Fracture surgery  1998    ORIF  . Eye surgery  10/2010    left eye cataract  . Carotid endarterectomy  05-1998    Left CEA  . Carotid endarterectomy  04/11/11    Right CEA  . Aorto- innominate bpg  2005    for innominate artery occlusive disease  . Colonoscopy    . Cesarean section    . Tubal ligation      Social History  Substance Use Topics  . Smoking status: Former Smoker -- 1.00 packs/day for 35 years    Types: Cigarettes    Quit date: 06/18/1996  .  Smokeless tobacco: Never Used  . Alcohol Use: 4.2 - 8.4 oz/week    7-14 Standard drinks or equivalent per week     Comment: WINE    Family History  Problem Relation Age of Onset  . Colon polyps Father   . Heart disease Father     Before age 54  . Stroke Father   . Deep vein thrombosis Father   . Colon cancer Neg Hx   . Esophageal cancer Neg Hx   . Rectal cancer Neg Hx   . Stomach cancer Neg Hx   . Vascular Disease Brother   . Alcoholism Paternal Grandfather   . Heart disease Paternal Grandmother   . Hypertension Mother   . Varicose Veins Mother   . Peripheral vascular disease Mother   . Heart disease Maternal Grandmother     No Known Allergies  Medication list has been reviewed and updated.  Current Outpatient Prescriptions on File Prior to Visit  Medication Sig Dispense Refill  . Albuterol Sulfate (PROAIR RESPICLICK) 102 (90 BASE) MCG/ACT AEPB Inhale 2 Act into the lungs 4 (four) times daily as needed. 1 each 5  . Ascorbic Acid (VITAMIN C) 1000 MG tablet Take 1,000 mg by mouth daily.    Marland Kitchen aspirin 81 MG tablet Take 81 mg by mouth every morning.     Marland Kitchen atorvastatin (LIPITOR) 40 MG tablet Take 1 tablet (40 mg total) by mouth daily. 90 tablet 3  . Calcium Citrate-Vitamin D 250-200 MG-UNIT TABS Take 2 tablets by mouth daily.      . cephALEXin (KEFLEX) 500 MG capsule Take 1 capsule (500 mg total) by mouth 4 (four) times daily. 40 capsule 0  . Chlorphen-Pyril-Phenyleph (PHENA-PLUS PO) Take 1 tablet by mouth daily.    . clopidogrel (PLAVIX) 75 MG tablet Take 1 tablet (75 mg total) by mouth at bedtime. 90 tablet 3  . diphenhydrAMINE (SOMINEX) 25 MG tablet Take 25 mg by mouth at bedtime as needed for allergies.    Marland Kitchen escitalopram (LEXAPRO) 20 MG tablet Take 1 tablet (20 mg total) by mouth daily. 90 tablet 3  . HYDROcodone-acetaminophen (NORCO) 10-325 MG per tablet Take 1/2 tablet every 8-12 hours as needed for pain. 30 tablet 0  . LORazepam (ATIVAN) 0.5 MG tablet TAKE 1/2 TABLET BY  MOUTH TWICE DAILY AS NEEDED FOR ANXIETY 30 tablet 0  . metoprolol succinate (TOPROL-XL) 50 MG 24 hr tablet Take 1 tablet (50 mg total) by mouth daily. with  or immediately following a meal. 30 tablet 5  . pantoprazole (PROTONIX) 40 MG tablet Take 40 mg by mouth daily.     No current facility-administered medications on file prior to visit.    Review of Systems:  As per HPI- otherwise negative. Denies any palpitations or chest pain  Physical Examination: Filed Vitals:   03/09/15 0854  BP: 107/66  Pulse: 81  Temp: 98.1 F (36.7 C)  Resp: 16   Filed Vitals:   03/09/15 0854  Height: 5' (1.524 m)  Weight: 123 lb 3.2 oz (55.883 kg)   Body mass index is 24.06 kg/(m^2). Ideal Body Weight: Weight in (lb) to have BMI = 25: 127.7  GEN: WDWN, NAD, Non-toxic, A & O x 3, looks well but frail HEENT: Atraumatic, Normocephalic. Neck supple. No masses, No LAD. Ears and Nose: No external deformity. CV: RRR, No M/G/R. No JVD. No thrill. No extra heart sounds.  No evidence of any arrythmia PULM: CTA B, no wheezes, crackles, rhonchi. No retractions. No resp. distress. No accessory muscle use. EXTR: No c/c/e NEURO Normal gait.  PSYCH: Normally interactive. Conversant. Not depressed or anxious appearing.  Calm demeanor.   Sat 83% upon entering clinic- improved to 90 % with rest  BP Readings from Last 3 Encounters:  03/09/15 107/66  01/22/15 140/82  01/04/15 104/70    Assessment and Plan: Hypoxemia  Need for influenza vaccination - Plan: Flu Vaccine QUAD 36+ mos IM  Chronic back pain - Plan: HYDROcodone-acetaminophen (NORCO) 10-325 MG per tablet, HYDROcodone-acetaminophen (NORCO) 10-325 MG per tablet  GAD (generalized anxiety disorder) - Plan: LORazepam (ATIVAN) 0.5 MG tablet  Osteoporosis - Plan: DG Bone Density  Overdue for dexa- ordered this today Flu shot Refilled her norco for 2 months and her ativan Contacted Dr. Melvyn Novas for her to get his opinion about home O2  Signed Lamar Blinks, MD

## 2015-03-09 NOTE — Patient Instructions (Signed)
It was very nice to see you today I refilled your hydrocodone for this month (due the end of September) and for October I will touch base with Dr. Melvyn Novas about your oxygen levels- it may be helpful to put you on oxygen as needed  Please see me in about 6 months or sooner if you have any concerns

## 2015-03-11 ENCOUNTER — Telehealth: Payer: Self-pay | Admitting: Family Medicine

## 2015-03-11 NOTE — Telephone Encounter (Signed)
-----  Message from Tanda Rockers, MD sent at 03/09/2015  9:34 AM EDT ----- This is a sticky issue that the feds are all over so we do things very much by the book when we prescribe 02  - the fine is 10k for doing it incorrectly.  I would need to see her for our folks to place her on 02  - the only exception we make on this is when it's precribed in the hospital at discharge, and even we see them back w/in 2 weeks to make sure all the i's are dotted and t's are crossed.  It wouldn't take but a quick visit with [place] to get her on amb 02 if needed.  Sorry to see she's not doing well.  Ronalee Belts  ----- Message -----    From: Darreld Mclean, MD    Sent: 03/09/2015   9:27 AM      To: Tanda Rockers, MD  Salley Scarlet Ronalee Belts- I am taking over as PCP for Ms. Schwering from a partner who has retired.  She had O2 sat of 83% upon walking into clinic today- improved to 90 with rest.  She is interested in starting home O2.  I wanted to touch base with you about this- if you agree, could you please put me in touch with the person who handles home O2 starts at your office.  It is not something that we do a lot of Best regards, JC

## 2015-03-11 NOTE — Telephone Encounter (Signed)
Per DR. Wert she needs to see them- called pt to let her know but cannot get through.  Will try back

## 2015-03-13 NOTE — Telephone Encounter (Signed)
[facility]  Please call Dr. Melvyn Novas and schedule an appt to set up home O2.   Will send letter to ensure she gets this information

## 2015-03-14 ENCOUNTER — Other Ambulatory Visit: Payer: Self-pay

## 2015-03-14 DIAGNOSIS — E2839 Other primary ovarian failure: Secondary | ICD-10-CM

## 2015-03-16 ENCOUNTER — Encounter: Payer: Self-pay | Admitting: Internal Medicine

## 2015-03-16 ENCOUNTER — Other Ambulatory Visit (INDEPENDENT_AMBULATORY_CARE_PROVIDER_SITE_OTHER): Payer: Medicare Other

## 2015-03-16 ENCOUNTER — Ambulatory Visit (INDEPENDENT_AMBULATORY_CARE_PROVIDER_SITE_OTHER): Payer: Medicare Other | Admitting: Internal Medicine

## 2015-03-16 VITALS — BP 138/78 | HR 74 | Ht 59.0 in | Wt 123.6 lb

## 2015-03-16 DIAGNOSIS — J8410 Pulmonary fibrosis, unspecified: Secondary | ICD-10-CM | POA: Diagnosis not present

## 2015-03-16 DIAGNOSIS — J9611 Chronic respiratory failure with hypoxia: Secondary | ICD-10-CM

## 2015-03-16 DIAGNOSIS — J9621 Acute and chronic respiratory failure with hypoxia: Secondary | ICD-10-CM | POA: Insufficient documentation

## 2015-03-16 DIAGNOSIS — J449 Chronic obstructive pulmonary disease, unspecified: Secondary | ICD-10-CM | POA: Diagnosis not present

## 2015-03-16 LAB — SEDIMENTATION RATE: Sed Rate: 26 mm/hr — ABNORMAL HIGH (ref 0–22)

## 2015-03-16 LAB — RHEUMATOID FACTOR: Rhuematoid fact SerPl-aCnc: <10 IU/mL (ref <=14)

## 2015-03-16 MED ORDER — PREDNISONE 10 MG PO TABS: ORAL_TABLET | ORAL | Status: DC

## 2015-03-16 NOTE — Patient Instructions (Addendum)
Prednisone 10 mg  Take 2 daily until breathing better then one daily   Be sure pantoprazole is 40 mg Take 30-60 min before first meal of the day and pepcid 20 mg at bedtime  Please see patient coordinator before you leave today  to schedule 02 2lpm 24/7 but increase to 3 lpm with walking more than room to room   GERD (REFLUX)  is an extremely common cause of respiratory symptoms just like yours , many times with no obvious heartburn at all.    It can be treated with medication, but also with lifestyle changes including elevation of the head of your bed (ideally with 6 inch  bed blocks),  Smoking cessation, avoidance of late meals, excessive alcohol, and avoid fatty foods, chocolate, peppermint, colas, red wine, and acidic juices such as orange juice.  NO MINT OR MENTHOL PRODUCTS SO NO COUGH DROPS  USE SUGARLESS CANDY INSTEAD (Jolley ranchers or Stover's or Life Savers) or even ice chips will also do - the key is to swallow to prevent all throat clearing. NO OIL BASED VITAMINS - use powdered substitutes.    Please remember to go to the lab  department downstairs for your tests - we will call you with the results when they are available.  Please schedule a follow up office visit in 4  weeks, sooner if needed

## 2015-03-16 NOTE — Progress Notes (Signed)
Subjective:    Patient ID: Cristina Parker, female    DOB: July 21, 1935,   MRN: [id number]   Brief patient profile:  9 yowf with h/o UC  quit smoking 1998 p CAP completely recovered with good activity tolerance but no aerobics then worse since 2010 p hip surgery with fatigue then around 2013  referred by Dr Leward Quan for copd evaluation 11/23/2014 but had USIP on w/u with hrct chest 01/06/15 and started on daily pred 03/16/2015    History of Present Illness  11/23/2014 1st [facility] office visit/ Wert   Chief Complaint  Patient presents with  . Pulmonary Consult    Referred by Dr. Ellsworth Lennox. Pt c/o SOB for the past 2-3 yrs- progressively getting worse. She gets SOB walking from room to room at home.   indolent onset gradually progressive  doe / fatigue s cough or variability x sev years and no better with inhalers to date  (? All saba's?)  rec You will need to walk at a slow pace or consider ambulatory 02       01/04/2015 f/u ov/Wert re:  Sob/ desats ? Etiology ? Developing PF? Chief Complaint  Patient presents with  . Follow-up    PFT done today. Pt states her breathing is unchanged. No new co's today.   [facility] cart at [facility] / uses [facility]  occ am cough  Sleeps ok  No better with albuterol  rec Please see patient coordinator before you leave today  to schedule high resolution CT chest  Late add ?NSIP on hrct > rec 3 m f/u    03/16/2015  f/u ov/Wert re:  NSIP/ worsening sats ? Needs to start 02  Chief Complaint  Patient presents with  . Acute Visit    Per Dr [name] pt wants to start home oxygen - Pt c/o worsening sob for past several months - Occas cough and wheezing at night only  indolent onset, slowly progressiv sob x walking flat and slow > 100 ft   No obvious day to day or daytime variability or assoc  cp or chest tightness,  or overt sinus or hb symptoms. No unusual exp hx or h/o childhood pna/ asthma or knowledge of premature birth.  Sleeping ok without  nocturnal  or early am exacerbation  of respiratory  c/o's or need for noct saba. Also denies any obvious fluctuation of symptoms with weather or environmental changes or other aggravating or alleviating factors except as outlined above   Current Medications, Allergies, Complete Past Medical History, Past Surgical History, Family History, and Social History were reviewed in [employer] record.  ROS  The following are not active complaints unless bolded sore throat, dysphagia, dental problems, itching, sneezing,  nasal congestion or excess/ purulent secretions, ear ache,   fever, chills, sweats, unintended wt loss, classically pleuritic or exertional cp, hemoptysis,  orthopnea pnd or leg swelling, presyncope, palpitations, abdominal pain, anorexia, nausea, vomiting, diarrhea  or change in bowel or bladder habits, change in stools or urine, dysuria,hematuria,  rash, arthralgias, visual complaints, headache, numbness, weakness or ataxia or problems with walking or coordination,  change in mood/affect or memory.            Objective:   Physical Exam  amb wf nad   01/04/2015      122  >    03/16/2015 124  Wt Readings from Last 3 Encounters:  11/23/14 121 lb (54.885 kg)  11/04/14 119 lb 12.8 oz (54.341 kg)  04/09/14 114 lb (51.71 kg)    Vital signs reviewed  HEENT: nl dentition, turbinates, and orophanx. Nl external ear canals without cough reflex   NECK :  without JVD/Nodes/TM/ nl carotid upstrokes bilaterally   LUNGS: no acc muscle use,    minimal bialteral insp crackles bases only    CV:  RRR  no s3 or murmur or increase in P2, no edema   ABD:  soft and nontender with pos hoover end insp in the supine position. No bruits or organomegaly, bowel sounds nl  MS:  warm without deformities, calf tenderness, cyanosis or clubbing  SKIN: warm and dry without lesions    NEURO:  alert, approp, no deficits       I personally reviewed images and agree with radiology  impression as follows:  HRCT Chest 01/06/15 1. Resolution of previously noted airspace disease in the left lower lobe. 2. Scattered areas of scarring in the lungs, most evident in the right upper lobe, similar to the prior examination. 3. 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   Labs ordered 03/16/2015 hsp profile/ collagen vasc dz profile     Lab Results  Component Value Date   ESRSEDRATE 26* 03/16/2015   ESRSEDRATE 35* 06/28/2009        Assessment & Plan:

## 2015-03-16 NOTE — Assessment & Plan Note (Signed)
sats 87% RA at rest 03/16/2015  - 03/16/2015  Walked 2lpm x 1 laps= 185 ft each stopped due to back pain/ sats 88%   rec as of 03/16/2015 >>   02 2lpm 24/7 but increase to 3 lpm with walking more than room to room

## 2015-03-17 LAB — ANA: ANA: NEGATIVE

## 2015-03-17 LAB — CYCLIC CITRUL PEPTIDE ANTIBODY, IGG: Cyclic Citrullin Peptide Ab: <16 Units

## 2015-03-18 NOTE — Assessment & Plan Note (Signed)
-  09/09/13 Echo Normal LV size and systolic function, EF 88-87%. Moderate diastolic dysfunction. Normal RV size and systolic function. No significant valvular abnormalities. Left atrial enlargement. Mild pulmonary hypertension. - 11/23/2014  Walked RA x 1 lap  @ 185 ft each stopped due to fatigue/ not sob/ but sats down to 85%  - 11/23/2014 spirometry: no obst/ restrictive changes only  - PFTs 01/04/15  VC 1.95 (91%) no obst dlco 45 corrects to 69%  - HRCT 01/06/2015 >High-resolution images do demonstrate findings suggesting underlying interstitial lung disease, and based on the spectrum of findings on today's examination, this is favored to represent nonspecific interstitial pneumonia (NSIP). However, these findings are new compared to more remote prior study 02/20/2011.  - 03/16/2015  Walked RA x 3 laps @ 185 ft each stopped due to   - NSIP w/u 03/16/2015 >  ESR  - trial of prednisone 03/16/15    ddx for nsip includes hsp (no risk factors identified) and collagen vasc dz (nothing to suggest) so may not be steroid resp but certainly worth a try.  Discussed in detail all the  indications, usual  risks and alternatives  relative to the benefits with patient who agrees to proceed with conservative rx = pred 20 mg per day until better then 10 mg daily   I had an extended discussion with the patient reviewing all relevant studies completed to date and  lasting 15 to 20 minutes of a 25 minute visit    Each maintenance medication was reviewed in detail including most importantly the difference between maintenance and prns and under what circumstances the prns are to be triggered using an action plan format that is not reflected in the computer generated alphabetically organized AVS.    Please see instructions for details which were reviewed in writing and the patient given a copy highlighting the part that I personally wrote and discussed at today's ov.

## 2015-03-21 ENCOUNTER — Telehealth: Payer: Self-pay | Admitting: Internal Medicine

## 2015-03-21 NOTE — Telephone Encounter (Signed)
ATC pt. Voicemail full. Will call back later

## 2015-03-22 DIAGNOSIS — J449 Chronic obstructive pulmonary disease, unspecified: Secondary | ICD-10-CM | POA: Diagnosis not present

## 2015-03-22 NOTE — Telephone Encounter (Signed)
Notes Recorded by Inge Rise, CMA on 03/21/2015 at 3:57 PM ATC PT NA, VM full, wcb Notes Recorded by Tanda Rockers, MD on 03/21/2015 at 3:03 PM Call patient : Studies are unremarkable, no evidence collagen vasc dz so no change recs ----------------------- Pt is aware of results. Nothing further was needed.

## 2015-03-29 ENCOUNTER — Ambulatory Visit: Payer: Medicare Other | Admitting: Internal Medicine

## 2015-03-29 LAB — HYPERSENSITIVITY PNUEMONITIS PROFILE

## 2015-03-30 NOTE — Progress Notes (Signed)
Quick Note:  Spoke with pt and notified of results per Dr. Wert. Pt verbalized understanding and denied any questions.  ______ 

## 2015-04-07 ENCOUNTER — Ambulatory Visit (INDEPENDENT_AMBULATORY_CARE_PROVIDER_SITE_OTHER): Payer: Medicare Other

## 2015-04-07 ENCOUNTER — Ambulatory Visit (INDEPENDENT_AMBULATORY_CARE_PROVIDER_SITE_OTHER): Payer: Medicare Other | Admitting: Family Medicine

## 2015-04-07 VITALS — BP 118/68 | HR 76 | Temp 98.3°F | Resp 18 | Ht 59.5 in | Wt 121.4 lb

## 2015-04-07 DIAGNOSIS — R059 Cough, unspecified: Secondary | ICD-10-CM

## 2015-04-07 DIAGNOSIS — R05 Cough: Secondary | ICD-10-CM

## 2015-04-07 DIAGNOSIS — B370 Candidal stomatitis: Secondary | ICD-10-CM | POA: Diagnosis not present

## 2015-04-07 DIAGNOSIS — J209 Acute bronchitis, unspecified: Secondary | ICD-10-CM

## 2015-04-07 MED ORDER — FLUCONAZOLE 150 MG PO TABS: 150.0000 mg | ORAL_TABLET | Freq: Once | ORAL | Status: DC

## 2015-04-07 MED ORDER — AZITHROMYCIN 250 MG PO TABS: ORAL_TABLET | ORAL | Status: DC

## 2015-04-07 NOTE — Progress Notes (Signed)
Subjective:  This chart was scribed for Cristina Haber MD,  by Cristina Parker, at [facility].  This patient was seen in room 13 and the patient's care was started at 11:37 AM.   Chief Complaint  Patient presents with   Cough    x1 week, green mucus. Cough and mucus will not go away.      Patient ID: Cristina Parker, female    DOB: 1936/05/18, 79 y.o.   MRN: [id number]  HPI  HPI Comments: Cristina Parker is a 79 y.o. female who presents to the [facility] complaining of a productive cough (yellow/green mucous) onset 1 week ago.  Two weeks ago, she felt some chest congestion and a scratchy throat so she took mucinex but states that it doesn't seem to go away.  She has associated symptoms of achy-ness.  She felt hot last night but is not sure if she had a fever.   She has had pnuemonia in the past (last in 2011-12). Patient is currently taking Prednisone (last two weeks) which she is almost finished using.  Patient quit smoking in 1998.  She is currently also using O2 but states that she is almost out of it.  She denies any nausea, vomiting or abdominal pain.  She is up to date with her flu shot.  Patient is retired. She was a Marine scientist.    Patient states that she does not have lung disease although she is short of breath and has been put on prednisone, oxygen and has an abnormal chest x ray.    Patient Active Problem List   Diagnosis Date Noted   Chronic respiratory failure with hypoxia ([place]) 03/16/2015   Postinflammatory pulmonary fibrosis ([place]) 11/23/2014   Chronic pain syndrome 11/04/2014   Carotid stenosis 04/06/2014   Aftercare following surgery of the circulatory system 04/06/2014   Cataract 09/25/2012   Arthritis 09/25/2012   Depression 09/25/2012   Osteoporosis, unspecified 09/25/2012   Duodenal stricture 09/16/2012   Murmur 12/19/2011   Occlusion and stenosis of carotid artery without mention of cerebral infarction  11/15/2011   Paroxysmal a-fib ([place]) 07/09/2011   COPD (chronic obstructive pulmonary disease) ([place]) 07/03/2011   OTHER ULCERATIVE COLITIS 08/09/2009   PVD 10/31/2007   HIATAL HERNIA 10/31/2007   Other and unspecified hyperlipidemia 07/18/2007   Essential hypertension 07/18/2007   CORONARY HEART DISEASE 07/18/2007   VENTRICULAR ARRHYTHMIA 07/18/2007   LUNG NODULE 07/18/2007   G E R D 07/18/2007   CAROTID ENDARTERECTOMY, LEFT, HX OF 07/18/2007   Past Medical History  Diagnosis Date   Heart disease    Crohn's disease ([place])    Duodenal stricture    Esophageal reflux    Hypertension    Hyperlipemia    Hiatal hernia    Pneumonia    Anemia    Depression    Collagenous colitis    Anxiety    Paroxysmal a-fib ([place]) 07/09/2011   Peripheral vascular disease ([place])    Blood transfusion without reported diagnosis    Duodenal ulcer    Arthritis    Cataract     BILATERAL   Heart murmur    Osteoporosis    Past Surgical History  Procedure Laterality Date   Coronary artery bypass graft     Hip surgery      right   Abdominal hysterectomy     Cervical disc surgery     Bunionectomy  1977    both feet    Fracture  surgery  1998    ORIF   Eye surgery  10/2010    left eye cataract   Carotid endarterectomy  05-1998    Left CEA   Carotid endarterectomy  04/11/11    Right CEA   Aorto- innominate bpg  2005    for innominate artery occlusive disease   Colonoscopy     Cesarean section     Tubal ligation     No Known Allergies Prior to Admission medications   Medication Sig Start Date End Date Taking? Authorizing Provider  Albuterol Sulfate (PROAIR RESPICLICK) 544 (90 BASE) MCG/ACT AEPB Inhale 2 Act into the lungs 4 (four) times daily as needed. 11/04/14  Yes Barton Fanny, MD  Ascorbic Acid (VITAMIN C) 1000 MG tablet Take 1,000 mg by mouth daily.   Yes Historical Provider, MD  aspirin 81 MG tablet Take 81 mg by mouth every morning.     Yes Historical Provider, MD  atorvastatin (LIPITOR) 40 MG tablet Take 1 tablet (40 mg total) by mouth daily. 11/04/14  Yes Barton Fanny, MD  Calcium Citrate-Vitamin D 250-200 MG-UNIT TABS Take 2 tablets by mouth daily.     Yes Historical Provider, MD  Chlorphen-Pyril-Phenyleph (PHENA-PLUS PO) Take 1 tablet by mouth daily.   Yes Historical Provider, MD  clopidogrel (PLAVIX) 75 MG tablet Take 1 tablet (75 mg total) by mouth at bedtime. 11/04/14  Yes Barton Fanny, MD  diphenhydrAMINE (SOMINEX) 25 MG tablet Take 25 mg by mouth at bedtime as needed for allergies.   Yes Historical Provider, MD  escitalopram (LEXAPRO) 20 MG tablet Take 1 tablet (20 mg total) by mouth daily. 11/04/14  Yes Barton Fanny, MD  HYDROcodone-acetaminophen [facility]) 10-325 MG per tablet Take 1/2 tablet every 8- 12 hours as needed for pain Ok to fill on 04/17/2015 03/09/15  Yes Jessica C Copland, MD  LORazepam (ATIVAN) 0.5 MG tablet TAKE 1/2 TABLET BY MOUTH TWICE DAILY AS NEEDED FOR ANXIETY 03/09/15  Yes Gay Filler Copland, MD  LUMIGAN 0.01 % SOLN Apply 1 drop in both eyes every night 03/13/15  Yes Historical Provider, MD  metoprolol succinate (TOPROL-XL) 50 MG 24 hr tablet Take 1 tablet (50 mg total) by mouth daily. with or immediately following a meal. 11/04/14  Yes Barton Fanny, MD  pantoprazole (PROTONIX) 40 MG tablet Take 40 mg by mouth daily.   Yes Historical Provider, MD  predniSONE (DELTASONE) 10 MG tablet Take  2 each am until better then 1 each am 03/16/15  Yes Tanda Rockers, MD   Social History   Social History   Marital Status: Widowed    Spouse Name: N/A   Number of Children: 2   Years of Education: N/A   Occupational History   retired Therapist, sports    Social History Main Topics   Smoking status: Former Smoker -- 1.00 packs/day for 35 years    Types: Cigarettes    Quit date: 06/18/1996   Smokeless tobacco: Never Used   Alcohol Use: 4.2 - 8.4 oz/week    7-14 Standard drinks or equivalent per  week     Comment: WINE   Drug Use: No   Sexual Activity: No   Other Topics Concern   Not on file   Social History Narrative   Retired Marine scientist;   Lives with son and grandson        Review of Systems  Constitutional: Negative for chills.  HENT: Positive for congestion.   Respiratory: Positive for cough.   Gastrointestinal:  Negative for nausea and vomiting.  Musculoskeletal: Positive for myalgias.       Objective:   Physical Exam  Constitutional: She appears well-developed. No distress.  HENT:  Head: Normocephalic and atraumatic.  Eyes: Pupils are equal, round, and reactive to light.  exophoria in her right eye.   Neck: Normal range of motion.  Pulmonary/Chest: No respiratory distress. She has rales.  Rales in her right chest.   Musculoskeletal: She exhibits no edema.  Neurological: She is alert.  Skin: Skin is warm and dry.    Filed Vitals:   04/07/15 1054  BP: 118/68  Pulse: 76  Temp: 98.3 F (36.8 C)  TempSrc: Oral  Resp: 18  Height: 4' 11.5" (1.511 m)  Weight: 121 lb 6.4 oz (55.067 kg)  SpO2: 91%   [facility] (PRIMARY) x-ray report read by Dr. Joseph Art:  CXR:  She has a old scar right upper lung field.  She has reticular pattern in the right lower lobe and she has thoracic scoliosis with past surgery on her c spine as well as stainless steel sutures on her sternum from previous open heart surgery.       Assessment & Plan:   This chart was scribed in my presence and reviewed by me personally.    ICD-9-CM ICD-10-CM   1. Cough 786.2 R05 DG Chest 2 View     azithromycin (ZITHROMAX) 250 MG tablet  2. Thrush 112.0 B37.0 fluconazole (DIFLUCAN) 150 MG tablet  3. Acute bronchitis, unspecified organism 466.0 J20.9 azithromycin (ZITHROMAX) 250 MG tablet     Signed, Cristina Haber, MD

## 2015-04-07 NOTE — Patient Instructions (Signed)
Thrush, Adult Ritta Slot, also called oral candidiasis, is a fungal infection that develops in the mouth and throat and on the tongue. It causes white patches to form on the mouth and tongue. Ritta Slot is most common in older adults, but it can occur at any age.  Many cases of thrush are mild, but this infection can also be more serious. Ritta Slot can be a recurring problem for people who have chronic illnesses or who take medicines that limit the body's ability to fight infection. Because these people have difficulty fighting infections, the fungus that causes thrush can spread throughout the body. This can cause life-threatening blood or organ infections. CAUSES  Ritta Slot is usually caused by a yeast called Candida albicans. This fungus is normally present in small amounts in the mouth and on other mucous membranes. It usually causes no harm. However, when conditions are present that allow the fungus to grow uncontrolled, it invades surrounding tissues and becomes an infection. Less often, other Candida species can also lead to thrush.  RISK FACTORS Ritta Slot is more likely to develop in the following people:  People with an impaired ability to fight infection (weakened immune system).   Older adults.   People with HIV.   People with diabetes.   People with dry mouth (xerostomia).   Pregnant women.   People with poor dental care, especially those who have false teeth.   People who use antibiotic medicines.  SIGNS AND SYMPTOMS  Ritta Slot can be a mild infection that causes no symptoms. If symptoms develop, they may include:   A burning feeling in the mouth and throat. This can occur at the start of a thrush infection.   White patches that adhere to the mouth and tongue. The tissue around the patches may be red, raw, and painful. If rubbed (during tooth brushing, for example), the patches and the tissue of the mouth may bleed easily.   A bad taste in the mouth or difficulty tasting foods.    Cottony feeling in the mouth.   Pain during eating and swallowing. DIAGNOSIS  Your health care provider can usually diagnose thrush by looking in your mouth and asking you questions about your health.  TREATMENT  Medicines that help prevent the growth of fungi (antifungals) are the standard treatment for thrush. These medicines are either applied directly to the affected area (topical) or swallowed (oral). The treatment will depend on the severity of the condition.  Mild Thrush Mild cases of thrush may clear up with the use of an antifungal mouth rinse or lozenges. Treatment usually lasts about 14 days.  Moderate to Severe Thrush  More severe thrush infections that have spread to the esophagus are treated with an oral antifungal medicine. A topical antifungal medicine may also be used.   For some severe infections, a treatment period longer than 14 days may be needed.   Oral antifungal medicines are almost never used during pregnancy because the fetus may be harmed. However, if a pregnant woman has a rare, severe thrush infection that has spread to her blood, oral antifungal medicines may be used. In this case, the risk of harm to the mother and fetus from the severe thrush infection may be greater than the risk posed by the use of antifungal medicines.  Persistent or Recurrent Thrush For cases of thrush that do not go away or keep coming back, treatment may involve the following:   Treatment may be needed twice as long as the symptoms last.   Treatment will include  both oral and topical antifungal medicines.   People with weakened immune systems can take an antifungal medicine on a continuous basis to prevent thrush infections.  It is important to treat conditions that make you more likely to get thrush, such as diabetes or HIV.  HOME CARE INSTRUCTIONS   Only take over-the-counter or prescription medicine as directed by your health care provider. Talk to your health care  provider about an over-the-counter medicine called gentian violet, which kills bacteria and fungi.   Eat plain, unflavored yogurt as directed by your health care provider. Check the label to make sure the yogurt contains live cultures. This yogurt can help healthy bacteria grow in the mouth that can stop the growth of the fungus that causes thrush.   Try these measures to help reduce the discomfort of thrush:   Drink cold liquids such as water or iced tea.   Try flavored ice treats or frozen juices.   Eat foods that are easy to swallow, such as gelatin, ice cream, or custard.   If the patches in your mouth are painful, try drinking from a straw.   Rinse your mouth several times a day with a warm saltwater rinse. You can make the saltwater mixture with 1 tsp (6 g) of salt in 8 fl oz (0.2 L) of warm water.   If you wear dentures, remove the dentures before going to bed, brush them vigorously, and soak them in a cleaning solution as directed by your health care provider.   Women who are breastfeeding should clean their nipples with an antifungal medicine as directed by their health care provider. Dry the nipples after breastfeeding. Applying lanolin-containing body lotion may help relieve nipple soreness.  SEEK MEDICAL CARE IF:  Your symptoms are getting worse or are not improving within 7 days of starting treatment.   You have symptoms of spreading infection, such as white patches on the skin outside of the mouth.   You are nursing and you have redness, burning, or pain in the nipples that is not relieved with treatment.  MAKE SURE YOU:  Understand these instructions.  Will watch your condition.  Will get help right away if you are not doing well or get worse.   This information is not intended to replace advice given to you by your health care provider. Make sure you discuss any questions you have with your health care provider.   Document Released: 02/28/2004 Document  Revised: 06/25/2014 Document Reviewed: 01/05/2013 Elsevier Interactive Patient Education 2016 [employer] Acute Bronchitis Bronchitis is inflammation of the airways that extend from the windpipe into the lungs (bronchi). The inflammation often causes mucus to develop. This leads to a cough, which is the most common symptom of bronchitis.  In acute bronchitis, the condition usually develops suddenly and goes away over time, usually in a couple weeks. Smoking, allergies, and asthma can make bronchitis worse. Repeated episodes of bronchitis may cause further lung problems.  CAUSES Acute bronchitis is most often caused by the same virus that causes a cold. The virus can spread from person to person (contagious) through coughing, sneezing, and touching contaminated objects. SIGNS AND SYMPTOMS   Cough.   Fever.   Coughing up mucus.   Body aches.   Chest congestion.   Chills.   Shortness of breath.   Sore throat.  DIAGNOSIS  Acute bronchitis is usually diagnosed through a physical exam. Your health care provider will also ask you questions about your medical history. Tests, such as chest  X-rays, are sometimes done to rule out other conditions.  TREATMENT  Acute bronchitis usually goes away in a couple weeks. Oftentimes, no medical treatment is necessary. Medicines are sometimes given for relief of fever or cough. Antibiotic medicines are usually not needed but may be prescribed in certain situations. In some cases, an inhaler may be recommended to help reduce shortness of breath and control the cough. A cool mist vaporizer may also be used to help thin bronchial secretions and make it easier to clear the chest.  HOME CARE INSTRUCTIONS  Get plenty of rest.   Drink enough fluids to keep your urine clear or pale yellow (unless you have a medical condition that requires fluid restriction). Increasing fluids may help thin your respiratory secretions (sputum) and reduce chest  congestion, and it will prevent dehydration.   Take medicines only as directed by your health care provider.  If you were prescribed an antibiotic medicine, finish it all even if you start to feel better.  Avoid smoking and secondhand smoke. Exposure to cigarette smoke or irritating chemicals will make bronchitis worse. If you are a smoker, consider using nicotine gum or skin patches to help control withdrawal symptoms. Quitting smoking will help your lungs heal faster.   Reduce the chances of another bout of acute bronchitis by washing your hands frequently, avoiding people with cold symptoms, and trying not to touch your hands to your mouth, nose, or eyes.   Keep all follow-up visits as directed by your health care provider.  SEEK MEDICAL CARE IF: Your symptoms do not improve after 1 week of treatment.  SEEK IMMEDIATE MEDICAL CARE IF:  You develop an increased fever or chills.   You have chest pain.   You have severe shortness of breath.  You have bloody sputum.   You develop dehydration.  You faint or repeatedly feel like you are going to pass out.  You develop repeated vomiting.  You develop a severe headache. MAKE SURE YOU:   Understand these instructions.  Will watch your condition.  Will get help right away if you are not doing well or get worse.   This information is not intended to replace advice given to you by your health care provider. Make sure you discuss any questions you have with your health care provider.   Document Released: 07/12/2004 Document Revised: 06/25/2014 Document Reviewed: 11/25/2012 Elsevier Interactive Patient Education [employer].

## 2015-04-08 ENCOUNTER — Encounter: Payer: Self-pay | Admitting: Family

## 2015-04-12 ENCOUNTER — Encounter: Payer: Self-pay | Admitting: Family

## 2015-04-12 ENCOUNTER — Ambulatory Visit (INDEPENDENT_AMBULATORY_CARE_PROVIDER_SITE_OTHER): Payer: Medicare Other | Admitting: Family

## 2015-04-12 ENCOUNTER — Ambulatory Visit (HOSPITAL_COMMUNITY)
Admission: RE | Admit: 2015-04-12 | Discharge: 2015-04-12 | Disposition: A | Discharge status: Home / Self Care | Payer: Medicare Other | Source: Ambulatory Visit | Attending: Family | Admitting: Family

## 2015-04-12 VITALS — BP 124/80 | HR 68 | Temp 96.9°F | Resp 16 | Ht 59.5 in | Wt 120.0 lb

## 2015-04-12 DIAGNOSIS — I771 Stricture of artery: Secondary | ICD-10-CM | POA: Diagnosis not present

## 2015-04-12 DIAGNOSIS — I779 Disorder of arteries and arterioles, unspecified: Secondary | ICD-10-CM | POA: Insufficient documentation

## 2015-04-12 DIAGNOSIS — Z48812 Encounter for surgical aftercare following surgery on the circulatory system: Secondary | ICD-10-CM | POA: Insufficient documentation

## 2015-04-12 DIAGNOSIS — I6522 Occlusion and stenosis of left carotid artery: Secondary | ICD-10-CM | POA: Insufficient documentation

## 2015-04-12 DIAGNOSIS — Z9889 Other specified postprocedural states: Secondary | ICD-10-CM

## 2015-04-12 DIAGNOSIS — I6523 Occlusion and stenosis of bilateral carotid arteries: Secondary | ICD-10-CM

## 2015-04-12 DIAGNOSIS — I119 Hypertensive heart disease without heart failure: Secondary | ICD-10-CM | POA: Insufficient documentation

## 2015-04-12 DIAGNOSIS — E785 Hyperlipidemia, unspecified: Secondary | ICD-10-CM | POA: Diagnosis not present

## 2015-04-12 NOTE — Progress Notes (Signed)
Established Carotid Patient   History of Present Illness  Cristina Parker is a 79 y.o. female patient of Dr. Donnetta Hutching is who underwent aortic innominate graft in January 2005, left CEA in 1999 and a right CEA in October 2012. She has known left CCA and left ICA occlusion.  She returns today for follow up.  The patient has no history of TIA or stroke symptoms, specifically the patient denies a history of amaurosis fugax or monocular blindness, denies a history unilateral facial drooping, denies a history of hemiplegia, and denies a history of receptive or expressive aphasia.   She has intermittent tingling in both hands, she has known c-spine issues. She has a history of a fracture in her right shoulder in about 2012, states both arms feel weak after repetitive activities that reach above shoulder height, this has been ongoing.  She remains dyspneic, states her dyspnea has not improved with use of supplemental O2, she sees a pulmonologist.   The patient reports New Medical or Surgical History: finished a Z-pack yesterday, had chest congestion which has improved.  Pt Diabetic: No Pt smoker: former smoker, quit in 1998  Pt meds include: Statin : Yes ASA: Yes Other anticoagulants/antiplatelets: Plavix   Past Medical History  Diagnosis Date  . Heart disease   . Crohn's disease ([place])   . Duodenal stricture   . Esophageal reflux   . Hypertension   . Hyperlipemia   . Hiatal hernia   . Pneumonia   . Anemia   . Depression   . Collagenous colitis   . Anxiety   . Paroxysmal a-fib ([place]) 07/09/2011  . Peripheral vascular disease ([place])   . Blood transfusion without reported diagnosis   . Duodenal ulcer   . Arthritis   . Cataract     BILATERAL  . Heart murmur   . Osteoporosis     Social History Social History  Substance Use Topics  . Smoking status: Former Smoker -- 1.00 packs/day for 35 years    Types: Cigarettes    Quit date: 06/18/1996  . Smokeless tobacco: Never Used  .  Alcohol Use: 4.2 - 8.4 oz/week    7-14 Standard drinks or equivalent per week     Comment: WINE    Family History Family History  Problem Relation Age of Onset  . Colon polyps Father   . Heart disease Father     Before age 62  . Stroke Father   . Deep vein thrombosis Father   . Colon cancer Neg Hx   . Esophageal cancer Neg Hx   . Rectal cancer Neg Hx   . Stomach cancer Neg Hx   . Vascular Disease Brother   . Alcoholism Paternal Grandfather   . Heart disease Paternal Grandmother   . Hypertension Mother   . Varicose Veins Mother   . Peripheral vascular disease Mother   . Heart disease Maternal Grandmother     Surgical History Past Surgical History  Procedure Laterality Date  . Coronary artery bypass graft    . Hip surgery      right  . Abdominal hysterectomy    . Cervical disc surgery    . Bunionectomy  1977    both feet   . Fracture surgery  1998    ORIF  . Eye surgery  10/2010    left eye cataract  . Carotid endarterectomy  05-1998    Left CEA  . Carotid endarterectomy  04/11/11    Right CEA  . Aorto- innominate  bpg  2005    for innominate artery occlusive disease  . Colonoscopy    . Cesarean section    . Tubal ligation      No Known Allergies  Current Outpatient Prescriptions  Medication Sig Dispense Refill  . Albuterol Sulfate (PROAIR RESPICLICK) 425 (90 BASE) MCG/ACT AEPB Inhale 2 Act into the lungs 4 (four) times daily as needed. 1 each 5  . Ascorbic Acid (VITAMIN C) 1000 MG tablet Take 1,000 mg by mouth daily.    Marland Kitchen aspirin 81 MG tablet Take 81 mg by mouth every morning.     Marland Kitchen atorvastatin (LIPITOR) 40 MG tablet Take 1 tablet (40 mg total) by mouth daily. 90 tablet 3  . clopidogrel (PLAVIX) 75 MG tablet Take 1 tablet (75 mg total) by mouth at bedtime. 90 tablet 3  . diphenhydrAMINE (SOMINEX) 25 MG tablet Take 25 mg by mouth at bedtime as needed for allergies.    Marland Kitchen escitalopram (LEXAPRO) 20 MG tablet Take 1 tablet (20 mg total) by mouth daily. 90 tablet  3  . HYDROcodone-acetaminophen (NORCO) 10-325 MG per tablet Take 1/2 tablet every 8- 12 hours as needed for pain Ok to fill on 04/17/2015 30 tablet 0  . LORazepam (ATIVAN) 0.5 MG tablet TAKE 1/2 TABLET BY MOUTH TWICE DAILY AS NEEDED FOR ANXIETY 30 tablet 0  . LUMIGAN 0.01 % SOLN Apply 1 drop in both eyes every night    . metoprolol succinate (TOPROL-XL) 50 MG 24 hr tablet Take 1 tablet (50 mg total) by mouth daily. with or immediately following a meal. 30 tablet 5  . pantoprazole (PROTONIX) 40 MG tablet Take 40 mg by mouth daily.    . predniSONE (DELTASONE) 10 MG tablet Take  2 each am until better then 1 each am 60 tablet 0  . azithromycin (ZITHROMAX) 250 MG tablet Take 2 tabs PO x 1 dose, then 1 tab PO QD x 4 days (Patient not taking: Reported on 04/12/2015) 6 tablet 0  . Calcium Citrate-Vitamin D 250-200 MG-UNIT TABS Take 2 tablets by mouth daily.      . Chlorphen-Pyril-Phenyleph (PHENA-PLUS PO) Take 1 tablet by mouth daily.    . fluconazole (DIFLUCAN) 150 MG tablet Take 1 tablet (150 mg total) by mouth once. (Patient not taking: Reported on 04/12/2015) 1 tablet 0   No current facility-administered medications for this visit.    Review of Systems : See HPI for pertinent positives and negatives.  Physical Examination  Filed Vitals:   04/12/15 1011 04/12/15 1019  BP: 118/84 124/80  Pulse: 66 68  Temp: 96.9 F (36.1 C)   Resp: 16   Height: 4' 11.5" (1.511 m)   Weight: 120 lb (54.432 kg)   SpO2: 84%    Body mass index is 23.84 kg/(m^2).  General: WDWN female in NAD GAIT: normal Eyes: PERRLA Pulmonary: Non-labored, limited air movement, no rales, no rhonchi, & no wheezing. + occasional moist cough.  Cardiac: regular rhythm, no detected murmur.  VASCULAR EXAM Carotid Bruits Right Left   Positive, harsh Positive, soft   Aorta is not palpable. Radial pulses: 1+ palpable right, not palpable left but has an audible Doppler signal.       LE Pulses Right Left   FEMORAL 2+ palpable 2+ palpable    POPLITEAL not palpable  not palpable   POSTERIOR TIBIAL not palpable  not palpable    DORSALIS PEDIS  ANTERIOR TIBIAL 1+ palpable  1+ palpable     Gastrointestinal: soft, nontender, BS WNL, no  r/g,no palpable masses.  Musculoskeletal: Negative muscle atrophy/wasting. M/S 5/5 throughout, Extremities without ischemic changes.  Neurologic: A&O X 3; Appropriate Affect, Speech is normal CN 2-12 intact, Pain and light touch intact in extremities, Motor exam as listed above.          Non-Invasive Vascular Imaging CAROTID DUPLEX 04/12/2015  The aorto- innominate artery bypass graft could not be adequately visualized due to anatomic limitations, with a velocity of 365 cm/s in the right proximal subclavian artery. Patent right CEA site with no ICA stenosis. Evidence of left proximal-mid CCA and mid-distal ICA occlusions. Bilateral proximal subclavian artery disease. No significant change compared to 04/06/2014.   Assessment: POLLIE POMA is a 79 y.o. female who is s/p aortic innominate graft in January 2005, left CEA in 1999 and a right CEA in October 2012. She has known left CCA and left ICA occlusion.  She has no history of TIA or stroke. Both arms feel tired after repetitive use of her arms, especially above shoulder height, this has been ongoing. Her right radial pulse is faintly palpable, left radial pulse is not palpable but has an audible Doppler signal. She has no other steal symptoms in either upper extremity.  Today's carotid duplex suggests a patent right CEA site with no ICA stenosis. Evidence of left proximal-mid CCA and mid-distal ICA occlusions. Bilateral proximal subclavian artery disease. The aorto- innominate artery bypass graft could not be adequately  visualized due to anatomic limitations, with a velocity of 365 cm/s in the right proximal subclavian artery.  No significant change compared to 04/06/2014.  I discussed pt HPI, physical exam, and carotid duplex results with Dr. Donnetta Hutching.   Plan: Follow-up in 1 year with Carotid Duplex scan. She knows to notify us if she develops signs or symptoms of lack of adequate perfusion of either upper extremity.  I discussed in depth with the patient the nature of atherosclerosis, and emphasized the importance of maximal medical management including strict control of blood pressure, blood glucose, and lipid levels, obtaining regular exercise, and continued cessation of smoking.  The patient is aware that without maximal medical management the underlying atherosclerotic disease process will progress, limiting the benefit of any interventions. The patient was given information about stroke prevention and what symptoms should prompt the patient to seek immediate medical care. Thank you for allowing [place] to participate in this patient's care.  Clemon Chambers, RN, MSN, FNP-C [facility] Office: [phone]  Clinic Physician: Early  04/12/2015 10:29 AM

## 2015-04-12 NOTE — Patient Instructions (Signed)
Stroke Prevention Some medical conditions and behaviors are associated with an increased chance of having a stroke. You may prevent a stroke by making healthy choices and managing medical conditions. HOW CAN I REDUCE MY RISK OF HAVING A STROKE?   Stay physically active. Get at least 30 minutes of activity on most or all days.  Do not smoke. It may also be helpful to avoid exposure to secondhand smoke.  Limit alcohol use. Moderate alcohol use is considered to be:  No more than 2 drinks per day for men.  No more than 1 drink per day for nonpregnant women.  Eat healthy foods. This involves:  Eating 5 or more servings of fruits and vegetables a day.  Making dietary changes that address high blood pressure (hypertension), high cholesterol, diabetes, or obesity.  Manage your cholesterol levels.  Making food choices that are high in fiber and low in saturated fat, trans fat, and cholesterol may control cholesterol levels.  Take any prescribed medicines to control cholesterol as directed by your health care provider.  Manage your diabetes.  Controlling your carbohydrate and sugar intake is recommended to manage diabetes.  Take any prescribed medicines to control diabetes as directed by your health care provider.  Control your hypertension.  Making food choices that are low in salt (sodium), saturated fat, trans fat, and cholesterol is recommended to manage hypertension.  Ask your health care provider if you need treatment to lower your blood pressure. Take any prescribed medicines to control hypertension as directed by your health care provider.  If you are 18-39 years of age, have your blood pressure checked every 3-5 years. If you are 40 years of age or older, have your blood pressure checked every year.  Maintain a healthy weight.  Reducing calorie intake and making food choices that are low in sodium, saturated fat, trans fat, and cholesterol are recommended to manage  weight.  Stop drug abuse.  Avoid taking birth control pills.  Talk to your health care provider about the risks of taking birth control pills if you are over 35 years old, smoke, get migraines, or have ever had a blood clot.  Get evaluated for sleep disorders (sleep apnea).  Talk to your health care provider about getting a sleep evaluation if you snore a lot or have excessive sleepiness.  Take medicines only as directed by your health care provider.  For some people, aspirin or blood thinners (anticoagulants) are helpful in reducing the risk of forming abnormal blood clots that can lead to stroke. If you have the irregular heart rhythm of atrial fibrillation, you should be on a blood thinner unless there is a good reason you cannot take them.  Understand all your medicine instructions.  Make sure that other conditions (such as anemia or atherosclerosis) are addressed. SEEK IMMEDIATE MEDICAL CARE IF:   You have sudden weakness or numbness of the face, arm, or leg, especially on one side of the body.  Your face or eyelid droops to one side.  You have sudden confusion.  You have trouble speaking (aphasia) or understanding.  You have sudden trouble seeing in one or both eyes.  You have sudden trouble walking.  You have dizziness.  You have a loss of balance or coordination.  You have a sudden, severe headache with no known cause.  You have new chest pain or an irregular heartbeat. Any of these symptoms may represent a serious problem that is an emergency. Do not wait to see if the symptoms will   go away. Get medical help at once. Call your local emergency services (911 in [place] Do not drive yourself to the hospital.   This information is not intended to replace advice given to you by your health care provider. Make sure you discuss any questions you have with your health care provider.   Document Released: 07/12/2004 Document Revised: 06/25/2014 Document Reviewed:  12/05/2012 Elsevier Interactive Patient Education [employer].

## 2015-04-15 DIAGNOSIS — J449 Chronic obstructive pulmonary disease, unspecified: Secondary | ICD-10-CM | POA: Diagnosis not present

## 2015-04-19 ENCOUNTER — Encounter: Payer: Self-pay | Admitting: Internal Medicine

## 2015-04-19 ENCOUNTER — Ambulatory Visit (INDEPENDENT_AMBULATORY_CARE_PROVIDER_SITE_OTHER): Payer: Medicare Other | Admitting: Internal Medicine

## 2015-04-19 VITALS — BP 102/62 | HR 77 | Ht 59.0 in | Wt 122.8 lb

## 2015-04-19 DIAGNOSIS — J9611 Chronic respiratory failure with hypoxia: Secondary | ICD-10-CM | POA: Diagnosis not present

## 2015-04-19 DIAGNOSIS — J8410 Pulmonary fibrosis, unspecified: Secondary | ICD-10-CM | POA: Diagnosis not present

## 2015-04-19 MED ORDER — PREDNISONE 10 MG PO TABS: ORAL_TABLET | ORAL | Status: DC

## 2015-04-19 NOTE — Progress Notes (Signed)
Subjective:    Patient ID: Cristina Parker, female    DOB: [birth year],   MRN: [id number]   Brief patient profile:  89 yowf with h/o UC  quit smoking 1998 p CAP completely recovered with good activity tolerance but no aerobics then worse since 2010 p hip surgery with fatigue then around 2013  referred by Dr Leward Quan for copd evaluation 11/23/2014 but had NSIP on w/u with hrct chest 01/06/15 and started on daily pred 03/16/2015    History of Present Illness  11/23/2014 1st Cristina Parker visit/ Finley Chevez   Chief Complaint  Patient presents with  . Pulmonary Consult    Referred by Dr. Ellsworth Lennox. Pt c/o SOB for the past 2-3 yrs- progressively getting worse. She gets SOB walking from room to room at home.   indolent onset gradually progressive  doe / fatigue s cough or variability x sev years and no better with inhalers to date  (? All saba's?)  rec You will need to walk at a slow pace or consider ambulatory 02       01/04/2015 f/u ov/Kimiyah Blick re:  Sob/ desats ? Etiology ? Developing PF? Chief Complaint  Patient presents with  . Follow-up    PFT done today. Pt states her breathing is unchanged. No new co's today.   uses cart at [facility] / uses [facility] parking  occ am cough  Sleeps ok  No better with albuterol  rec Please see patient coordinator before you leave today  to schedule high resolution CT chest  Late add ?NSIP on hrct > rec 3 m f/u    03/16/2015  f/u ov/Laury Huizar re:  NSIP/ worsening sats ? Needs to start 02  Chief Complaint  Patient presents with  . Acute Visit    Per Dr [name] pt wants to start home oxygen - Pt c/o worsening sob for past several months - Occas cough and wheezing at night only  indolent onset, slowly progressiv sob x walking flat and slow > 100 ft  rec Prednisone 10 mg  Take 2 daily until breathing better then one daily  Be sure pantoprazole is 40 mg Take 30-60 min before first meal of the day and pepcid 20 mg at bedtime Please see patient coordinator before you  leave today  to schedule 02 2lpm 24/7 but increase to 3 lpm with walking more than room to room  GERD  Diet     04/19/2015  f/u ov/Miakoda Mcmillion re: PF ? Nsip?  Chief Complaint  Patient presents with  . Follow-up    Pt states her breathing is unchanged. She admits to not using o2 24/7- requestin lighter POC.   walks at [facility] pushing cart at 3lpm, def better with activity on 02 Did not follow instructions re use of prednsione 2 daily until better     No obvious day to day or daytime variability or assoc  Cough or cp or chest tightness,  or overt sinus or hb symptoms. No unusual exp hx or h/o childhood pna/ asthma or knowledge of premature birth.  Sleeping ok without nocturnal  or early am exacerbation  of respiratory  c/o's or need for noct saba. Also denies any obvious fluctuation of symptoms with weather or environmental changes or other aggravating or alleviating factors except as outlined above   Current Medications, Allergies, Complete Past Medical History, Past Surgical History, Family History, and Social History were reviewed in [employer] record.  ROS  The following are not active complaints unless  bolded sore throat, dysphagia, dental problems, itching, sneezing,  nasal congestion or excess/ purulent secretions, ear ache,   fever, chills, sweats, unintended wt loss, classically pleuritic or exertional cp, hemoptysis,  orthopnea pnd or leg swelling, presyncope, palpitations, abdominal pain, anorexia, nausea, vomiting, diarrhea  or change in bowel or bladder habits, change in stools or urine, dysuria,hematuria,  rash, arthralgias, visual complaints, headache, numbness, weakness or ataxia or problems with walking or coordination,  change in mood/affect or memory.            Objective:   Physical Exam  amb wf nad on 2lpm   01/04/2015      122  >    03/16/2015 124 > 04/19/2015  123  Wt Readings from Last 3 Encounters:  11/23/14 121 lb (54.885 kg)  11/04/14 119 lb 12.8 oz  (54.341 kg)  04/09/14 114 lb (51.71 kg)    Vital signs reviewed  HEENT: nl dentition, turbinates, and orophanx. Nl external ear canals without cough reflex   NECK :  without JVD/Nodes/TM/ nl carotid upstrokes bilaterally   LUNGS: no acc muscle use,    minimal bialteral insp crackles bases only    CV:  RRR  no s3 or murmur or increase in P2, no edema   ABD:  soft and nontender with pos hoover end insp in the supine position. No bruits or organomegaly, bowel sounds nl  MS:  warm without deformities, calf tenderness, cyanosis or clubbing  SKIN: warm and dry without lesions    NEURO:  alert, approp, no deficits       I personally reviewed images and agree with radiology impression as follows:  HRCT Chest 01/06/15 1. Resolution of previously noted airspace disease in the left lower lobe. 2. Scattered areas of scarring in the lungs, most evident in the right upper lobe, similar to the prior examination. 3. 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   Labs ordered 03/16/2015 hsp profile/ collagen vasc dz profile > neg     Lab Results  Component Value Date   ESRSEDRATE 26* 03/16/2015   ESRSEDRATE 35* 06/28/2009        Assessment & Plan:

## 2015-04-19 NOTE — Patient Instructions (Addendum)
Prednisone 10 mg  Take 2 daily until breathing better then one daily   Be sure pantoprazole is continued  40 mg Take 30-60 min before first meal of the day and pepcid 20 mg at bedtime  Continue 02 2lpm 24/7 but increase to 3 lpm with more than room to room walking and we will see if you qualify for different system   Please schedule a follow up office visit in 4  weeks, sooner if needed

## 2015-04-20 ENCOUNTER — Other Ambulatory Visit: Payer: Self-pay | Admitting: Family Medicine

## 2015-04-21 ENCOUNTER — Ambulatory Visit
Admission: RE | Admit: 2015-04-21 | Discharge: 2015-04-21 | Disposition: A | Discharge status: Home / Self Care | Payer: Medicare Other | Source: Ambulatory Visit | Attending: Family Medicine | Admitting: Family Medicine

## 2015-04-21 DIAGNOSIS — M810 Age-related osteoporosis without current pathological fracture: Secondary | ICD-10-CM | POA: Diagnosis not present

## 2015-04-21 DIAGNOSIS — E2839 Other primary ovarian failure: Secondary | ICD-10-CM

## 2015-04-22 ENCOUNTER — Encounter: Payer: Self-pay | Admitting: Internal Medicine

## 2015-04-22 NOTE — Assessment & Plan Note (Signed)
-  09/09/13 Echo Normal LV size and systolic function, EF 99-35%. Moderate diastolic dysfunction. Normal RV size and systolic function. No significant valvular abnormalities. Left atrial enlargement. Mild pulmonary hypertension. - 11/23/2014  Walked RA x 1 lap  @ 185 ft each stopped due to fatigue/ not sob/ but sats down to 85%  - 11/23/2014 spirometry: no obst/ restrictive changes only  - PFTs 01/04/15  VC 1.95 (91%) no obst dlco 45 corrects to 69%  - HRCT 01/06/2015 >High-resolution images do demonstrate findings suggesting underlying interstitial lung disease, and based on the spectrum of findings on today's examination, this is favored to represent nonspecific interstitial pneumonia (NSIP). However, these findings are new compared to more remote prior study 02/20/2011.  - 03/16/2015  Walked RA x 1 laps @ 185 ft desat to 80 > see chronic resp failure - NSIP w/u 03/16/2015 >  ESR 26/   HSP neg/ collagen vasc screen neg  - trial of prednisone 03/16/15 > did not follow instructions > try again 04/19/2015 = 20 mg daily until better then 10 mg daily   The goal with a potentially  steroid dependent illness is always arriving at the lowest effective dose that controls the disease/symptoms and not accepting a set "formula" which is based on statistics or guidelines that don't always take into account patient  variability or the natural hx of the dz in every individual patient, which may well vary over time.  For now therefore I recommend the patient maintain  20 mg per day until feels better or returns to office, then 10 mg daily   I had an extended discussion with the patient reviewing all relevant studies completed to date and  lasting 15 to 20 minutes of a 25 minute visit    Each maintenance medication was reviewed in detail including most importantly the difference between maintenance and prns and under what circumstances the prns are to be triggered using an action plan format that is not reflected in  the computer generated alphabetically organized AVS.    Please see instructions for details which were reviewed in writing and the patient given a copy highlighting the part that I personally wrote and discussed at today's ov.

## 2015-04-22 NOTE — Assessment & Plan Note (Signed)
sats 87% RA at rest 03/16/2015 and 85% at rest 04/19/2015  - 03/16/2015  Walked 2lpm x 1 laps= 185 ft each stopped due to back pain/ sats 88%   rec as of 04/19/2015  >>   02 2lpm 24/7 but increase to 3 lpm with walking more than room to room

## 2015-04-24 NOTE — Progress Notes (Signed)
Patient ID: Cristina Parker, female   DOB: 25-Mar-1936, 79 y.o.   MRN: [id number] 79 y.o. vasculopath. Previously seen by Dr Ron Parker. CAD with [place] to LAD in 2005. This was a combined procedure with Dr Amedeo Plenty who did an innominate bypass for severe arch disease. Previously had RCEA in 1999. Lost to F/U.  No angina, mild exertional dyspnea. Ambulation limited by hip pain not claudication. Compliant with meds.   Saw Dr Donnetta Hutching 6/13 With new disease in left carotid: Extensive supra-aortic peripheral vascular occlusive disease with a patent arch to innominate bypass. She does have a probable patency of her common carotid artery on the left with a markedly diminished flow with probable proximal stenosis. With her extensive left subclavian disease she would not be a candidate for subclavian carotid bypass. Repeat arch operation huge endeavor and Dr Donnetta Hutching not inclined to proceed at this time   CXR 04/07/15  stable COPD  Scarring CE no CHF  Sees Dr Melvyn Novas    Duplex VVS 03/2014 with patent RCEA  Occluded left ICA  Significant bilateral subclavian stenosis unable to visualize graft.  Right proximal subclavian with velocity 3.79msec And monophasic proximal left subclavian   ROS: Denies fever, malais, weight loss, blurry vision, decreased visual acuity, cough, sputum, SOB, hemoptysis, pleuritic pain, palpitaitons, heartburn, abdominal pain, melena, lower extremity edema, claudication, or rash.  All other systems reviewed and negative  General:  BP lower in left arm 80/ palp  Affect appropriate Healthy:  appears stated age H45 normal Neck supple with no adenopathy JVP normal bilateral subclavian and carotid  bruits  Higher pitched on right than left no thyromegaly Lungs clear with no wheezing and good diaphragmatic motion Heart:  S1/S2 mild AS murmur, no rub, gallop or click PMI normal Abdomen: benighn, BS positve, no tenderness, no AAA no bruit.  No HSM or HJR Distal pulses intact with no bruits No  edema Neuro non-focal Skin warm and dry No muscular weakness   Current Outpatient Prescriptions  Medication Sig Dispense Refill  . Albuterol Sulfate (PROAIR RESPICLICK) 1343(90 BASE) MCG/ACT AEPB Inhale 2 puffs into the lungs every 4 (four) hours as needed (SHORTNESS OF BREATH).    . Ascorbic Acid (VITAMIN C) 1000 MG tablet Take 1,000 mg by mouth daily.    .Marland Kitchenaspirin 81 MG tablet Take 81 mg by mouth every morning.     .Marland Kitchenatorvastatin (LIPITOR) 40 MG tablet Take 1 tablet (40 mg total) by mouth daily. 90 tablet 3  . Chlorphen-Pyril-Phenyleph (PHENA-PLUS PO) Take 1 tablet by mouth daily.    . clopidogrel (PLAVIX) 75 MG tablet Take 1 tablet (75 mg total) by mouth at bedtime. 90 tablet 3  . diphenhydrAMINE (SOMINEX) 25 MG tablet Take 25 mg by mouth at bedtime as needed for allergies.    .Marland Kitchenescitalopram (LEXAPRO) 20 MG tablet Take 1 tablet (20 mg total) by mouth daily. 90 tablet 3  . HYDROcodone-acetaminophen (NORCO) 10-325 MG per tablet Take 1/2 tablet every 8- 12 hours as needed for pain Ok to fill on 04/17/2015 30 tablet 0  . LORazepam (ATIVAN) 0.5 MG tablet TAKE 1/2 TABLET BY MOUTH TWICE DAILY AS NEEDED FOR ANXIETY 30 tablet 1  . LUMIGAN 0.01 % SOLN Apply 1 drop in both eyes every night    . metoprolol succinate (TOPROL-XL) 50 MG 24 hr tablet Take 1 tablet (50 mg total) by mouth daily. with or immediately following a meal. 30 tablet 5  . pantoprazole (PROTONIX) 40 MG tablet Take 40 mg by  mouth daily.    . predniSONE (DELTASONE) 10 MG tablet Take two (2) tablets by mouth daily until better then reduce to one (1) tablet by mouth daily.     No current facility-administered medications for this visit.    Allergies  Review of patient's allergies indicates no known allergies.   Electrocardiogram:  1/16  SR vs accelerated juncitonal no ishcemic changes  04/26/15  SR rate 78 LVH with nonspecific ST changes   Assessment and Plan CAD:  Distant CABG with LIMA to LAD.  Given known left subclavian  disease suspect it is not functional no angina on ASA/beta blocker PVD:  F/U Dr early significant bilateral subclavian disease with previous innominate bypass and patent RCEA  No TIA symptoms  COPD:  No active wheezing f/u Dr Melvyn Novas continue inhalers     Jenkins Rouge

## 2015-04-25 ENCOUNTER — Telehealth: Payer: Self-pay | Admitting: Family Medicine

## 2015-04-25 DIAGNOSIS — M810 Age-related osteoporosis without current pathological fracture: Secondary | ICD-10-CM

## 2015-04-25 NOTE — Telephone Encounter (Signed)
Received her bone density results- she does have osteoporosis She would like to have treatment, however I am concerned about oral therapy as she has a history of duodenal stricture, hiatal hernia and GERD, and duodenal ulcer.  She would be interested in reclast potentially. Will refer to endocrine for consultation

## 2015-04-26 ENCOUNTER — Ambulatory Visit (INDEPENDENT_AMBULATORY_CARE_PROVIDER_SITE_OTHER): Payer: Medicare Other | Admitting: Cardiovascular Disease

## 2015-04-26 ENCOUNTER — Encounter: Payer: Self-pay | Admitting: Cardiovascular Disease

## 2015-04-26 VITALS — BP 110/72 | HR 78 | Ht 59.0 in | Wt 123.6 lb

## 2015-04-26 DIAGNOSIS — I2510 Atherosclerotic heart disease of native coronary artery without angina pectoris: Secondary | ICD-10-CM | POA: Diagnosis not present

## 2015-04-26 DIAGNOSIS — I2583 Coronary atherosclerosis due to lipid rich plaque: Principal | ICD-10-CM

## 2015-04-26 NOTE — Patient Instructions (Signed)
Medication Instructions:  Your physician recommends that you continue on your current medications as directed. Please refer to the Current Medication list given to you today.   Labwork: NONE  Testing/Procedures: NONE  Follow-Up: Your physician wants you to follow-up in: [place] will receive a reminder letter in the mail two months in advance. If you don't receive a letter, please call our office to schedule the follow-up appointment.  Any Other Special Instructions Will Be Listed Below (If Applicable).     If you need a refill on your cardiac medications before your next appointment, please call your pharmacy.

## 2015-04-28 ENCOUNTER — Encounter: Payer: Self-pay | Admitting: Internal Medicine

## 2015-05-03 ENCOUNTER — Ambulatory Visit (INDEPENDENT_AMBULATORY_CARE_PROVIDER_SITE_OTHER): Payer: Medicare Other | Admitting: Endocrinology

## 2015-05-03 ENCOUNTER — Encounter: Payer: Self-pay | Admitting: Endocrinology

## 2015-05-03 VITALS — BP 134/82 | HR 83 | Temp 98.2°F | Ht 59.0 in | Wt 125.0 lb

## 2015-05-03 DIAGNOSIS — I480 Paroxysmal atrial fibrillation: Secondary | ICD-10-CM | POA: Diagnosis not present

## 2015-05-03 DIAGNOSIS — M810 Age-related osteoporosis without current pathological fracture: Secondary | ICD-10-CM

## 2015-05-03 LAB — BASIC METABOLIC PANEL
BUN: 25 mg/dL — AB (ref 6–23)
CALCIUM: 9.2 mg/dL (ref 8.4–10.5)
CO2: 29 mEq/L (ref 19–32)
CREATININE: 0.7 mg/dL (ref 0.40–1.20)
Chloride: 98 mEq/L (ref 96–112)
GFR: 86.05 mL/min (ref >60.00)
Glucose, Bld: 114 mg/dL — ABNORMAL HIGH (ref 70–99)
Potassium: 4.4 mEq/L (ref 3.5–5.1)
Sodium: 134 mEq/L — ABNORMAL LOW (ref 135–145)

## 2015-05-03 LAB — VITAMIN D 25 HYDROXY (VIT D DEFICIENCY, FRACTURES): VITD: 19.21 ng/mL — ABNORMAL LOW (ref 30.00–100.00)

## 2015-05-03 LAB — TSH: TSH: 0.64 u[IU]/mL (ref 0.35–4.50)

## 2015-05-03 NOTE — Patient Instructions (Addendum)
blood tests are requested for you today.  We'll let you know about the results. it is critically important to prevent falling down (keep floor areas well-lit, dry, and free of loose objects.  If you have a cane, walker, or wheelchair, you should use it, even for short trips around the house.  Also, try not to rush). Take calcium 1200 mg per day, and vitamin-D, 400 units per day.   Based on the results, I would probably recommend "prolia."  However, we are not sure if that is a better option than the "reclast" (similar to fosamax, but is a once-a-year infusion).   Please return in 6 months.

## 2015-05-03 NOTE — Progress Notes (Signed)
Subjective:    Patient ID: Cristina Parker, female    DOB: Dec 29, 1935, 79 y.o.   MRN: [id number]  HPI Pt was dx'ed osteoporosis in 1998.  She took fosamax for approx 1-2 years then, but has been on no other rx for osteoporosis.  she had a spinal compression fx as far back as 1999.  She has also fx both wrists and right hip.  She has no history of any of the following: early menopause, cancer, renal dz, thyroid problems, prolonged bedrest, alcoholism, or hyperparathyroidism.  She does not take heparin or anticonvulsants.  She took her first course of prednisone starting a few weeks ago.  She smoked from [phone].  She has slight leg cramps, and assoc low-back pain.  Past Medical History  Diagnosis Date  . Heart disease   . Crohn's disease ([place])   . Duodenal stricture   . Esophageal reflux   . Hypertension   . Hyperlipemia   . Hiatal hernia   . Pneumonia   . Anemia   . Depression   . Collagenous colitis   . Anxiety   . Paroxysmal a-fib ([place]) 07/09/2011  . Peripheral vascular disease ([place])   . Blood transfusion without reported diagnosis   . Duodenal ulcer   . Arthritis   . Cataract     BILATERAL  . Heart murmur   . Osteoporosis     Past Surgical History  Procedure Laterality Date  . Coronary artery bypass graft    . Hip surgery      right  . Abdominal hysterectomy    . Cervical disc surgery    . Bunionectomy  1977    both feet   . Fracture surgery  1998    ORIF  . Eye surgery  10/2010    left eye cataract  . Carotid endarterectomy  05-1998    Left CEA  . Carotid endarterectomy  04/11/11    Right CEA  . Aorto- innominate bpg  2005    for innominate artery occlusive disease  . Colonoscopy    . Cesarean section    . Tubal ligation      Social History   Social History  . Marital Status: Widowed    Spouse Name: N/A  . Number of Children: 2  . Years of Education: N/A   Occupational History  . retired Therapist, sports    Social History Main Topics  . Smoking status: Former  Smoker -- 1.00 packs/day for 35 years    Types: Cigarettes    Quit date: 06/18/1996  . Smokeless tobacco: Never Used  . Alcohol Use: 4.2 - 8.4 oz/week    7-14 Standard drinks or equivalent per week     Comment: WINE  . Drug Use: No  . Sexual Activity: No   Other Topics Concern  . Not on file   Social History Narrative   Retired Marine scientist;   Lives with son and grandson    Current Outpatient Prescriptions on File Prior to Visit  Medication Sig Dispense Refill  . Albuterol Sulfate (PROAIR RESPICLICK) 500 (90 BASE) MCG/ACT AEPB Inhale 2 puffs into the lungs every 4 (four) hours as needed (SHORTNESS OF BREATH).    . Ascorbic Acid (VITAMIN C) 1000 MG tablet Take 1,000 mg by mouth daily.    Marland Kitchen aspirin 81 MG tablet Take 81 mg by mouth every morning.     Marland Kitchen atorvastatin (LIPITOR) 40 MG tablet Take 1 tablet (40 mg total) by mouth daily. 90 tablet 3  .  Chlorphen-Pyril-Phenyleph (PHENA-PLUS PO) Take 1 tablet by mouth daily.    . clopidogrel (PLAVIX) 75 MG tablet Take 1 tablet (75 mg total) by mouth at bedtime. 90 tablet 3  . diphenhydrAMINE (SOMINEX) 25 MG tablet Take 25 mg by mouth at bedtime as needed for allergies.    Marland Kitchen escitalopram (LEXAPRO) 20 MG tablet Take 1 tablet (20 mg total) by mouth daily. 90 tablet 3  . HYDROcodone-acetaminophen (NORCO) 10-325 MG per tablet Take 1/2 tablet every 8- 12 hours as needed for pain Ok to fill on 04/17/2015 30 tablet 0  . LORazepam (ATIVAN) 0.5 MG tablet TAKE 1/2 TABLET BY MOUTH TWICE DAILY AS NEEDED FOR ANXIETY 30 tablet 1  . LUMIGAN 0.01 % SOLN Apply 1 drop in both eyes every night    . metoprolol succinate (TOPROL-XL) 50 MG 24 hr tablet Take 1 tablet (50 mg total) by mouth daily. with or immediately following a meal. 30 tablet 5  . pantoprazole (PROTONIX) 40 MG tablet Take 40 mg by mouth daily.    . predniSONE (DELTASONE) 10 MG tablet Take two (2) tablets by mouth daily until better then reduce to one (1) tablet by mouth daily.     No current  facility-administered medications on file prior to visit.    No Known Allergies  Family History  Problem Relation Age of Onset  . Colon polyps Father   . Heart disease Father     Before age 34  . Stroke Father   . Deep vein thrombosis Father   . Colon cancer Neg Hx   . Esophageal cancer Neg Hx   . Rectal cancer Neg Hx   . Stomach cancer Neg Hx   . Vascular Disease Brother   . Alcoholism Paternal Grandfather   . Heart disease Paternal Grandmother   . Hypertension Mother   . Varicose Veins Mother   . Peripheral vascular disease Mother   . Heart disease Maternal Grandmother   . Osteoporosis Mother     BP 134/82 mmHg  Pulse 83  Temp(Src) 98.2 F (36.8 C) (Oral)  Ht _0  (1.499 m)  Wt 125 lb (56.7 kg)  BMI 25.23 kg/m2  SpO2 95%  Review of Systems  HENT: Negative for hearing loss.   Eyes:       No change in chronic visual loss   denies weight loss, hematuria, n/v, cold intolerance, edema, LOC, skin rash, insomnia, falls, memory loss.  She has intermittent falls (due to gait probs), rhinorrhea, and easy bruising.    Objective:   Physical Exam VS: see vs page GEN: no distress HEAD: head: no deformity eyes: no periorbital swelling, no proptosis external nose and ears are normal mouth: no lesion seen NECK: supple, thyroid is not enlarged.  Old healed surgical scar (left CEA) CHEST WALL: no deformity. slight kyphosis.  Old healed surgical scar (median sternotomy) LUNGS:  Clear to auscultation CV: reg rate and rhythm, no murmur ABD: abdomen is soft, nontender.  no hepatosplenomegaly.  not distended.  no hernia.   MUSCULOSKELETAL: muscle bulk and strength are grossly normal.  no obvious joint swelling.  gait is normal and steady EXTEMITIES: no deformity.  no ulcer on the feet.  feet are of normal color and temp.  no edema PULSES: dorsalis pedis intact bilat.  no carotid bruit NEURO:  cn 2-12 grossly intact.   readily moves all 4's.  sensation is intact to touch on the  feet SKIN:  Normal texture and temperature.  No rash or suspicious lesion is  visible.   NODES:  None palpable at the neck PSYCH: alert, well-oriented.  Does not appear anxious nor depressed.     DEXA: (2001): osteoporosis             (04/21/15): significant decrease in BMD of the left hip since prior exam dated 01/02/2002  CXR: (04/07/15): Multiple thoracic compression deformities are again identified. Osteopenia  Lab Results  Component Value Date   CREATININE 0.70 11/04/2014   BUN 22 11/04/2014   NA 136 11/04/2014   K 4.0 11/04/2014   CL 100 11/04/2014   CO2 25 11/04/2014   Lab Results  Component Value Date   ALT 11 11/04/2014   AST 18 11/04/2014   ALKPHOS 108 11/04/2014   BILITOT 0.5 11/04/2014   Lab Results  Component Value Date   TSH 0.64 05/03/2015  vit-D is low    Assessment & Plan:  osteoporosis, evere exacerbation Vit-D deficiency, new to me.  We need to check PTH.   Frail elderly state.  She is at high risk for recurrent falls.   Patient is advised the following: Patient Instructions  blood tests are requested for you today.  We'll let you know about the results. it is critically important to prevent falling down (keep floor areas well-lit, dry, and free of loose objects.  If you have a cane, walker, or wheelchair, you should use it, even for short trips around the house.  Also, try not to rush). Take calcium 1200 mg per day, and vitamin-D, 400 units per day.   Based on the results, I would probably recommend "prolia."  However, we are not sure if that is a better option than the "reclast" (similar to fosamax, but is a once-a-year infusion).   Please return in 6 months.

## 2015-05-04 LAB — PTH, INTACT AND CALCIUM
CALCIUM: 8.8 mg/dL (ref 8.4–10.5)
PTH: 110 pg/mL — ABNORMAL HIGH (ref 14–64)

## 2015-05-05 ENCOUNTER — Other Ambulatory Visit: Payer: Self-pay | Admitting: Endocrinology

## 2015-05-05 ENCOUNTER — Telehealth: Payer: Self-pay

## 2015-05-05 LAB — PROTEIN ELECTROPHORESIS, SERUM
ALBUMIN ELP: 3.9 g/dL (ref 3.8–4.8)
ALPHA-2-GLOBULIN: 0.9 g/dL (ref 0.5–0.9)
Alpha-1-Globulin: 0.3 g/dL (ref 0.2–0.3)
BETA GLOBULIN: 0.5 g/dL (ref 0.4–0.6)
Beta 2: 0.3 g/dL (ref 0.2–0.5)
Gamma Globulin: 0.8 g/dL (ref 0.8–1.7)
Total Protein, Serum Electrophoresis: 6.6 g/dL (ref 6.1–8.1)

## 2015-05-05 MED ORDER — VITAMIN D (ERGOCALCIFEROL) 1.25 MG (50000 UNIT) PO CAPS: 50000.0000 [IU] | ORAL_CAPSULE | ORAL | Status: DC

## 2015-05-05 NOTE — Telephone Encounter (Signed)
Rose, Could we please initiate a prolia prior authorization for this patient?  Thank you! Jinny Blossom

## 2015-05-06 ENCOUNTER — Other Ambulatory Visit: Payer: Self-pay

## 2015-05-09 NOTE — Telephone Encounter (Signed)
I have electronically submitted pt's info for [place] verification and will notify you once I have a response. Thank you.

## 2015-05-16 DIAGNOSIS — J449 Chronic obstructive pulmonary disease, unspecified: Secondary | ICD-10-CM | POA: Diagnosis not present

## 2015-05-19 ENCOUNTER — Ambulatory Visit: Payer: Medicare Other | Admitting: Internal Medicine

## 2015-05-23 NOTE — Telephone Encounter (Signed)
Attempted to reach the pt will. Will try again at a later time.

## 2015-05-23 NOTE — Telephone Encounter (Signed)
Attempted to reach the pt. Will try again at a later time.

## 2015-05-23 NOTE — Telephone Encounter (Signed)
I have rec'd pt's insurance verification for Prolia and pt will have an estimated responsibility of $230. Please make pt aware this is an estimate and we will not know and exact amt until insurance(s) has/have paid.  I have sent a copy of the summary of benefits to be scanned into pt's chart.    If pt cannot afford $230 for her injection, please advise her to contact [employer] at [phone] and select option #1 to see if she qualifies for one of their assistance programs.  If she qualifies they will instruct her how to proceed.  If you have any questions, please let me know. Thank you.

## 2015-05-23 NOTE — Telephone Encounter (Signed)
I contacted the pt and advised of note below. She is going to contact our office once she receives the information from patient assistance.

## 2015-05-24 ENCOUNTER — Ambulatory Visit (INDEPENDENT_AMBULATORY_CARE_PROVIDER_SITE_OTHER): Payer: Medicare Other | Admitting: Internal Medicine

## 2015-05-24 ENCOUNTER — Encounter: Payer: Self-pay | Admitting: Internal Medicine

## 2015-05-24 VITALS — BP 100/70 | HR 92 | Ht 59.0 in | Wt 127.0 lb

## 2015-05-24 DIAGNOSIS — J8410 Pulmonary fibrosis, unspecified: Secondary | ICD-10-CM

## 2015-05-24 DIAGNOSIS — J9611 Chronic respiratory failure with hypoxia: Secondary | ICD-10-CM | POA: Diagnosis not present

## 2015-05-24 NOTE — Patient Instructions (Signed)
Reduce prednisone to one tablet daily   Please see patient coordinator before you leave today  to schedule a lighter source of portable 02   Please schedule a follow up office visit in 6 weeks, call sooner if needed

## 2015-05-24 NOTE — Progress Notes (Signed)
Subjective:   Patient ID: Cristina Parker, female    DOB: February 12, 1936,   MRN: [id number]     Brief patient profile:  35 yowf with h/o UC  quit smoking 1998 p CAP completely recovered with good activity tolerance but no aerobics then worse since 2010 p hip surgery with fatigue then around 2013  referred by Dr Leward Quan for copd evaluation 11/23/2014 but had NSIP on w/u with hrct chest 01/06/15 and started on daily pred 03/16/2015      History of Present Illness  11/23/2014 1st [facility] office visit/ Torri Langston   Chief Complaint  Patient presents with  . Pulmonary Consult    Referred by Dr. Ellsworth Lennox. Pt c/o SOB for the past 2-3 yrs- progressively getting worse. She gets SOB walking from room to room at home.   indolent onset gradually progressive  doe / fatigue s cough or variability x sev years and no better with inhalers to date  (? All saba's?)  rec You will need to walk at a slow pace or consider ambulatory 02       01/04/2015 f/u ov/Tommie Dejoseph re:  Sob/ desats ? Etiology ? Developing PF? Chief Complaint  Patient presents with  . Follow-up    PFT done today. Pt states her breathing is unchanged. No new co's today.   uses cart at [employer] / uses [facility] parking  occ am cough  Sleeps ok  No better with albuterol  rec Please see patient coordinator before you leave today  to schedule high resolution CT chest  Late add ?NSIP on hrct > rec 3 m f/u    03/16/2015  f/u ov/Larkin Alfred re:  NSIP/ worsening sats ? Needs to start 02  Chief Complaint  Patient presents with  . Acute Visit    Per Dr [name] pt wants to start home oxygen - Pt c/o worsening sob for past several months - Occas cough and wheezing at night only  indolent onset, slowly progressiv sob x walking flat and slow > 100 ft  rec Prednisone 10 mg  Take 2 daily until breathing better then one daily  Be sure pantoprazole is 40 mg Take 30-60 min before first meal of the day and pepcid 20 mg at bedtime Please see patient coordinator  before you leave today  to schedule 02 2lpm 24/7 but increase to 3 lpm with walking more than room to room  GERD  Diet     04/19/2015  f/u ov/Kelson Queenan re: PF ? Nsip?  Chief Complaint  Patient presents with  . Follow-up    Pt states her breathing is unchanged. She admits to not using o2 24/7- requestin lighter POC.   walks at [facility] pushing cart at 3lpm, def better with activity on 02 Did not follow instructions re use of prednsione 2 daily until better  rec Prednisone 10 mg  Take 2 daily until breathing better then one daily  Be sure pantoprazole is continued  40 mg Take 30-60 min before first meal of the day and pepcid 20 mg at bedtime Continue 02 2lpm 24/7 but increase to 3 lpm with more than room to room walking and we will see if you qualify for different system   05/24/2015  f/u ov/Tesneem Dufrane re: nsip/ on pred 20 mg daily really no change   Chief Complaint  Patient presents with  . Follow-up    Her breathing is unchnaged. No new co's. She has not been using proair b/c she claims it doesn't help.  no change ex tol, still able to walk at [place] on 3lpm but doesn't really have a light portable system she can use   No obvious day to day or daytime variability or assoc  Cough or cp or chest tightness,  or overt sinus or hb symptoms. No unusual exp hx or h/o childhood pna/ asthma or knowledge of premature birth.  Sleeping ok without nocturnal  or early am exacerbation  of respiratory  c/o's or need for noct saba. Also denies any obvious fluctuation of symptoms with weather or environmental changes or other aggravating or alleviating factors except as outlined above   Current Medications, Allergies, Complete Past Medical History, Past Surgical History, Family History, and Social History were reviewed in [employer] record.  ROS  The following are not active complaints unless bolded sore throat, dysphagia, dental problems, itching, sneezing,  nasal congestion or excess/ purulent  secretions, ear ache,   fever, chills, sweats, unintended wt loss, classically pleuritic or exertional cp, hemoptysis,  orthopnea pnd or leg swelling, presyncope, palpitations, abdominal pain, anorexia, nausea, vomiting, diarrhea  or change in bowel or bladder habits, change in stools or urine, dysuria,hematuria,  rash, arthralgias, visual complaints, headache, numbness, weakness or ataxia or problems with walking or coordination,  change in mood/affect or memory.            Objective:   Physical Exam  amb wf nad on RA   01/04/2015      122  >    03/16/2015 124 > 04/19/2015  123 > 05/24/2015  127    11/23/14 121 lb (54.885 kg)  11/04/14 119 lb 12.8 oz (54.341 kg)  04/09/14 114 lb (51.71 kg)    Vital signs reviewed  HEENT: nl dentition, turbinates, and orophanx. Nl external ear canals without cough reflex   NECK :  without JVD/Nodes/TM/ nl carotid upstrokes bilaterally   LUNGS: no acc muscle use,    minimal bilateral insp crackles bases only    CV:  RRR  no s3 or murmur or increase in P2, no edema   ABD:  soft and nontender with pos hoover end insp in the supine position. No bruits or organomegaly, bowel sounds nl  MS:  warm without deformities, calf tenderness, cyanosis or clubbing  SKIN: warm and dry without lesions    NEURO:  alert, approp, no deficits       I personally reviewed images and agree with radiology impression as follows:  HRCT Chest 01/06/15 1. Resolution of previously noted airspace disease in the left lower lobe. 2. Scattered areas of scarring in the lungs, most evident in the right upper lobe, similar to the prior examination. 3. 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   Labs ordered 03/16/2015 hsp profile/ collagen vasc dz profile > neg     Lab Results    Component Value Date   ESRSEDRATE 26* 03/16/2015   ESRSEDRATE 35* 06/28/2009        Assessment & Plan:

## 2015-05-25 ENCOUNTER — Encounter: Payer: Self-pay | Admitting: Internal Medicine

## 2015-05-25 ENCOUNTER — Telehealth: Payer: Self-pay

## 2015-05-25 DIAGNOSIS — G8929 Other chronic pain: Secondary | ICD-10-CM

## 2015-05-25 DIAGNOSIS — M549 Dorsalgia, unspecified: Principal | ICD-10-CM

## 2015-05-25 NOTE — Telephone Encounter (Signed)
Pt is needing a refill on hydrocodone please call once ready to pick [phone]

## 2015-05-25 NOTE — Assessment & Plan Note (Signed)
-  09/09/13 Echo Normal LV size and systolic function, EF 41-93%. Moderate diastolic dysfunction. Normal RV size and systolic function. No significant valvular abnormalities. Left atrial enlargement. Mild pulmonary hypertension. - 11/23/2014  Walked RA x 1 lap  @ 185 ft each stopped due to fatigue/ not sob/ but sats down to 85%  - 11/23/2014 spirometry: no obst/ restrictive changes only  - PFTs 01/04/15  VC 1.95 (91%) no obst dlco 45 corrects to 69%  - HRCT 01/06/2015 >High-resolution images do demonstrate findings suggesting underlying interstitial lung disease, and based on the spectrum of findings on today's examination, this is favored to represent nonspecific interstitial pneumonia (NSIP). However, these findings are new compared to more remote prior study 02/20/2011.   - NSIP w/u 03/16/2015 >  ESR 26/   HSP neg/ collagen vasc screen neg  - trial of prednisone 03/16/15 > did not follow instructions > try again 04/19/2015 = 20 mg daily until better then 10 mg daily    Difficult to tell if breathing better as not walking on 02 as rec but so far not really impressed the prednisone is doing much for her which really isn't a surprise given neg collagen vasc screen and relatively low esr baseline  The goal with a chronic steroid dependent illness is always arriving at the lowest effective dose that controls the disease/symptoms and not accepting a set "formula" which is based on statistics or guidelines that don't always take into account patient  variability or the natural hx of the dz in every individual patient, which may well vary over time.  For now therefore I recommend the patient maintain  A floor of 10 mg daily for now  I had an extended discussion with the patient reviewing all relevant studies completed to date and  lasting 15 to 20 minutes of a 25 minute visit    Each maintenance medication was reviewed in detail including most importantly the difference between maintenance and prns and  under what circumstances the prns are to be triggered using an action plan format that is not reflected in the computer generated alphabetically organized AVS.    Please see instructions for details which were reviewed in writing and the patient given a copy highlighting the part that I personally wrote and discussed at today's ov.

## 2015-05-25 NOTE — Assessment & Plan Note (Signed)
sats 87% RA at rest 03/16/2015 and 85% at rest 04/19/2015  - 03/16/2015  Walked 2lpm x 1 laps= 185 ft each stopped due to back pain/ sats 88%  - 05/24/2015  Walked 3lpm x 2 laps @ 185 ft each stopped due to sob but sats ok   rec as of 05/24/2015   >>   02 2lpm 24/7 but increase to 3 lpm with walking more than room to room

## 2015-05-27 MED ORDER — HYDROCODONE-ACETAMINOPHEN 10-325 MG PO TABS: ORAL_TABLET | ORAL | Status: DC

## 2015-05-27 NOTE — Telephone Encounter (Signed)
She may have another #30 hydrocodone in my absence.  Thank you!

## 2015-05-27 NOTE — Telephone Encounter (Signed)
Done

## 2015-05-27 NOTE — Telephone Encounter (Signed)
Notified pt ready.

## 2015-05-27 NOTE — Addendum Note (Signed)
Addended by: Mancel Bale on: 05/27/2015 10:52 AM   Modules accepted: Orders

## 2015-06-03 ENCOUNTER — Telehealth: Payer: Self-pay | Admitting: Endocrinology

## 2015-06-03 DIAGNOSIS — E559 Vitamin D deficiency, unspecified: Secondary | ICD-10-CM | POA: Insufficient documentation

## 2015-06-03 NOTE — Telephone Encounter (Signed)
The next step is to recheck the vitamin-D level.  i have ordered

## 2015-06-03 NOTE — Telephone Encounter (Signed)
Vitamin d is running out is the pt to continue taking if so please call into [facility]

## 2015-06-03 NOTE — Addendum Note (Signed)
Addended by: Renato Shin on: 06/03/2015 04:08 PM   Modules accepted: Orders

## 2015-06-03 NOTE — Telephone Encounter (Signed)
The next step is to recheck the vit-D level.  Please come in to recheck

## 2015-06-03 NOTE — Telephone Encounter (Signed)
Attempted to reach the pt. Pt unavailable. Will try again at a later time.

## 2015-06-03 NOTE — Telephone Encounter (Signed)
See note below and please advise. Pt has not yet decided if she wants to proceed with the prolia injection.

## 2015-06-06 NOTE — Telephone Encounter (Signed)
I contacted the pt and advised of MD note below. PT voiced understanding and is scheduled for 06/07/2015 to have her lab work completed.

## 2015-06-07 ENCOUNTER — Other Ambulatory Visit (INDEPENDENT_AMBULATORY_CARE_PROVIDER_SITE_OTHER): Payer: Medicare Other

## 2015-06-07 DIAGNOSIS — E559 Vitamin D deficiency, unspecified: Secondary | ICD-10-CM | POA: Diagnosis not present

## 2015-06-07 LAB — VITAMIN D 25 HYDROXY (VIT D DEFICIENCY, FRACTURES): VITD: 35.36 ng/mL (ref 30.00–100.00)

## 2015-06-15 DIAGNOSIS — J449 Chronic obstructive pulmonary disease, unspecified: Secondary | ICD-10-CM | POA: Diagnosis not present

## 2015-06-29 ENCOUNTER — Telehealth: Payer: Self-pay | Admitting: Family Medicine

## 2015-06-29 DIAGNOSIS — G8929 Other chronic pain: Secondary | ICD-10-CM

## 2015-06-29 DIAGNOSIS — M549 Dorsalgia, unspecified: Principal | ICD-10-CM

## 2015-06-29 MED ORDER — HYDROCODONE-ACETAMINOPHEN 10-325 MG PO TABS: ORAL_TABLET | ORAL | Status: DC

## 2015-06-29 NOTE — Telephone Encounter (Signed)
Pt came into clinic today requesting a RF of her hydrocodone. She last received #30 a little over a month ago. Will RF today

## 2015-06-30 ENCOUNTER — Other Ambulatory Visit: Payer: Self-pay

## 2015-06-30 MED ORDER — METOPROLOL SUCCINATE ER 50 MG PO TB24: 50.0000 mg | ORAL_TABLET | Freq: Every day | ORAL | Status: DC

## 2015-06-30 NOTE — Telephone Encounter (Signed)
Dr Lorelei Pont, pt had check up with you in Sept, but don't see HTN discussed. OK to give RFs?

## 2015-07-05 ENCOUNTER — Ambulatory Visit: Payer: Medicare Other | Admitting: Internal Medicine

## 2015-07-07 ENCOUNTER — Other Ambulatory Visit: Payer: Self-pay | Admitting: Family Medicine

## 2015-07-08 ENCOUNTER — Encounter: Payer: Self-pay | Admitting: Family Medicine

## 2015-07-13 ENCOUNTER — Encounter: Payer: Self-pay | Admitting: Family Medicine

## 2015-07-16 DIAGNOSIS — J449 Chronic obstructive pulmonary disease, unspecified: Secondary | ICD-10-CM | POA: Diagnosis not present

## 2015-07-26 ENCOUNTER — Encounter: Payer: Self-pay | Admitting: Internal Medicine

## 2015-07-26 ENCOUNTER — Ambulatory Visit (INDEPENDENT_AMBULATORY_CARE_PROVIDER_SITE_OTHER): Payer: Medicare Other | Admitting: Internal Medicine

## 2015-07-26 VITALS — BP 92/60 | HR 72 | Ht 59.0 in | Wt 130.0 lb

## 2015-07-26 DIAGNOSIS — J8410 Pulmonary fibrosis, unspecified: Secondary | ICD-10-CM

## 2015-07-26 DIAGNOSIS — J9611 Chronic respiratory failure with hypoxia: Secondary | ICD-10-CM | POA: Diagnosis not present

## 2015-07-26 NOTE — Progress Notes (Signed)
Subjective:   Patient ID: Cristina Parker, female    DOB: February 12, 1936,   MRN: [id number]     Brief patient profile:  35 yowf with h/o UC  quit smoking 1998 p CAP completely recovered with good activity tolerance but no aerobics then worse since 2010 p hip surgery with fatigue then around 2013  referred by Dr Leward Quan for copd evaluation 11/23/2014 but had NSIP on w/u with hrct chest 01/06/15 and started on daily pred 03/16/2015      History of Present Illness  11/23/2014 1st [facility] office visit/ [name]   Chief Complaint  Patient presents with  . Pulmonary Consult    Referred by Dr. Ellsworth Lennox. Pt c/o SOB for the past 2-3 yrs- progressively getting worse. She gets SOB walking from room to room at home.   indolent onset gradually progressive  doe / fatigue s cough or variability x sev years and no better with inhalers to date  (? All saba's?)  rec You will need to walk at a slow pace or consider ambulatory 02       01/04/2015 f/u ov/[name] re:  Sob/ desats ? Etiology ? Developing PF? Chief Complaint  Patient presents with  . Follow-up    PFT done today. Pt states her breathing is unchanged. No new co's today.   uses cart at [employer] / uses [facility] parking  occ am cough  Sleeps ok  No better with albuterol  rec Please see patient coordinator before you leave today  to schedule high resolution CT chest  Late add ?NSIP on hrct > rec 3 m f/u    03/16/2015  f/u ov/[name] re:  NSIP/ worsening sats ? Needs to start 02  Chief Complaint  Patient presents with  . Acute Visit    Per Dr [name] pt wants to start home oxygen - Pt c/o worsening sob for past several months - Occas cough and wheezing at night only  indolent onset, slowly progressiv sob x walking flat and slow > 100 ft  rec Prednisone 10 mg  Take 2 daily until breathing better then one daily  Be sure pantoprazole is 40 mg Take 30-60 min before first meal of the day and pepcid 20 mg at bedtime Please see patient coordinator  before you leave today  to schedule 02 2lpm 24/7 but increase to 3 lpm with walking more than room to room  GERD  Diet     04/19/2015  f/u ov/[name] re: PF ? Nsip?  Chief Complaint  Patient presents with  . Follow-up    Pt states her breathing is unchanged. She admits to not using o2 24/7- requestin lighter POC.   walks at [facility] pushing cart at 3lpm, def better with activity on 02 Did not follow instructions re use of prednsione 2 daily until better  rec Prednisone 10 mg  Take 2 daily until breathing better then one daily  Be sure pantoprazole is continued  40 mg Take 30-60 min before first meal of the day and pepcid 20 mg at bedtime Continue 02 2lpm 24/7 but increase to 3 lpm with more than room to room walking and we will see if you qualify for different system   05/24/2015  f/u ov/[name] re: nsip/ on pred 20 mg daily really no change   Chief Complaint  Patient presents with  . Follow-up    Her breathing is unchnaged. No new co's. She has not been using proair b/c she claims it doesn't help.  no change ex tol, still able to walk at [place] on 3lpm but doesn't really have a light portable system she can use rec Reduce prednisone to one tablet daily  Please see patient coordinator before you leave today  to schedule a lighter source of portable 02     07/26/2015  f/u ov/[name] re: 02 dep pf/ ?NSIP on 2lpm 24/7 3lpm walking Chief Complaint  Patient presents with  . Follow-up    Breathing is unchanged. She stopped prednisone 1 wk ago due to devloping sore in her mouth.   still able to to [employer] on 3lpm/ very poor insight though into 02 use and not using consistently as rec  No change sob since d/c pred    No obvious day to day or daytime variability or assoc  Cough or cp or chest tightness,  or overt sinus or hb symptoms. No unusual exp hx or h/o childhood pna/ asthma or knowledge of premature birth.  Sleeping ok without nocturnal  or early am exacerbation  of respiratory  c/o's or need  for noct saba. Also denies any obvious fluctuation of symptoms with weather or environmental changes or other aggravating or alleviating factors except as outlined above   Current Medications, Allergies, Complete Past Medical History, Past Surgical History, Family History, and Social History were reviewed in [employer] record.  ROS  The following are not active complaints unless bolded sore throat, dysphagia, dental problems, itching, sneezing,  nasal congestion or excess/ purulent secretions, ear ache,   fever, chills, sweats, unintended wt loss, classically pleuritic or exertional cp, hemoptysis,  orthopnea pnd or leg swelling, presyncope, palpitations, abdominal pain, anorexia, nausea, vomiting, diarrhea  or change in bowel or bladder habits, change in stools or urine, dysuria,hematuria,  rash, arthralgias, visual complaints, headache, numbness, weakness or ataxia or problems with walking or coordination,  change in mood/affect or memory.            Objective:   Physical Exam  amb wf nad     01/04/2015      122  >    03/16/2015 124 > 04/19/2015  123 > 05/24/2015  127>07/26/2015   130      11/23/14 121 lb (54.885 kg)  11/04/14 119 lb 12.8 oz (54.341 kg)  04/09/14 114 lb (51.71 kg)    Vital signs reviewed  HEENT: nl dentition, turbinates, and orophanx. Nl external ear canals without cough reflex   NECK :  without JVD/Nodes/TM/ nl carotid upstrokes bilaterally   LUNGS: no acc muscle use,    minimal bilateral insp crackles bases only    CV:  RRR  no s3 or murmur or increase in P2, no edema   ABD:  soft and nontender with pos hoover end insp in the supine position. No bruits or organomegaly, bowel sounds nl  MS:  warm without deformities, calf tenderness, cyanosis or clubbing  SKIN: warm and dry without lesions    NEURO:  alert, approp, no deficits       I personally reviewed images and agree with radiology impression as follows:  HRCT Chest 01/06/15 1.  Resolution of previously noted airspace disease in the left lower lobe. 2. Scattered areas of scarring in the lungs, most evident in the right upper lobe, similar to the prior examination. 3. 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   Labs ordered 03/16/2015 hsp  profile/ collagen vasc dz profile > neg          Assessment & Plan:

## 2015-07-26 NOTE — Patient Instructions (Signed)
Ok to leave off prednisone for now  I am referring you to Dr Chase Caller for a second opinion regarding your pulmonary fibrosis

## 2015-07-26 NOTE — Assessment & Plan Note (Addendum)
sats 87% RA at rest 03/16/2015 and 85% at rest 04/19/2015  - 03/16/2015  Walked 2lpm x 1 laps= 185 ft each stopped due to back pain/ sats 88%  - 05/24/2015  Walked 3lpm x 2 laps @ 185 ft each stopped due to sob but sats ok  - 07/26/2015   Walked 3lpm  x one lap @ 185 stopped due to  Sob with sats  89% p one week off pred  rec as of 07/26/2015   >>   02 2lpm 24/7 but increase to 3 lpm with walking more than room to room

## 2015-07-30 ENCOUNTER — Encounter: Payer: Self-pay | Admitting: Internal Medicine

## 2015-07-30 NOTE — Assessment & Plan Note (Signed)
-  09/09/13 Echo Normal LV size and systolic function, EF 54-09%. Moderate diastolic dysfunction. Normal RV size and systolic function. No significant valvular abnormalities. Left atrial enlargement. Mild pulmonary hypertension. - 11/23/2014  Walked RA x 1 lap  @ 185 ft each stopped due to fatigue/ not sob/ but sats down to 85%  - 11/23/2014 spirometry: no obst/ restrictive changes only  - PFTs 01/04/15  VC 1.95 (91%) no obst dlco 45 corrects to 69%  - HRCT 01/06/2015 >High-resolution images do demonstrate findings suggesting underlying interstitial lung disease, and based on the spectrum of findings on today's examination, this is favored to represent nonspecific interstitial pneumonia (NSIP). However, these findings are new compared to more remote prior study 02/20/2011.   - NSIP w/u 03/16/2015 >  ESR 26/   HSP neg/ collagen vasc screen neg  - trial of prednisone 03/16/15 > did not follow instructions > try again 04/19/2015 = 20 mg daily until better then 10 mg daily > d/c 07/20/15 - Referred to [facility] 07/26/2015 >>>  This is apparently a steroid resistant version of NSIP and really nothing else to offer in this clinic so I asked her to consider seeing Dr Brantley Persons for a second opinion vs [place] and she chose the former  I had an extended discussion with the patient reviewing all relevant studies completed to date and  lasting 15 to 20 minutes of a 25 minute visit    Each maintenance medication was reviewed in detail including most importantly the difference between maintenance and prns and under what circumstances the prns are to be triggered using an action plan format that is not reflected in the computer generated alphabetically organized AVS.    Please see instructions for details which were reviewed in writing and the patient given a copy highlighting the part that I personally wrote and discussed at today's ov.

## 2015-08-01 ENCOUNTER — Telehealth: Payer: Self-pay

## 2015-08-01 DIAGNOSIS — G8929 Other chronic pain: Secondary | ICD-10-CM

## 2015-08-01 DIAGNOSIS — M549 Dorsalgia, unspecified: Principal | ICD-10-CM

## 2015-08-01 NOTE — Telephone Encounter (Signed)
Pt is needing a refill on hydrocodone   Best number [phone]  Pt is requesting dr Brigitte Pulse to be the one she sees once [place] is gone

## 2015-08-02 MED ORDER — HYDROCODONE-ACETAMINOPHEN 10-325 MG PO TABS: ORAL_TABLET | ORAL | Status: DC

## 2015-08-02 NOTE — Telephone Encounter (Signed)
Reviewed her controlled substance database - no unexpected entries.  She gets #30 hydrocodone about once a month.  Will give her 2 rx today Called pt and LMOM

## 2015-08-16 DIAGNOSIS — J449 Chronic obstructive pulmonary disease, unspecified: Secondary | ICD-10-CM | POA: Diagnosis not present

## 2015-08-24 ENCOUNTER — Ambulatory Visit (INDEPENDENT_AMBULATORY_CARE_PROVIDER_SITE_OTHER): Payer: Medicare Other | Admitting: Family Medicine

## 2015-08-24 VITALS — BP 100/62 | HR 102 | Temp 98.0°F | Resp 16 | Ht 59.0 in | Wt 124.0 lb

## 2015-08-24 DIAGNOSIS — R05 Cough: Secondary | ICD-10-CM | POA: Diagnosis not present

## 2015-08-24 DIAGNOSIS — J9611 Chronic respiratory failure with hypoxia: Secondary | ICD-10-CM

## 2015-08-24 DIAGNOSIS — G8929 Other chronic pain: Secondary | ICD-10-CM | POA: Diagnosis not present

## 2015-08-24 DIAGNOSIS — M549 Dorsalgia, unspecified: Secondary | ICD-10-CM | POA: Diagnosis not present

## 2015-08-24 DIAGNOSIS — J8410 Pulmonary fibrosis, unspecified: Secondary | ICD-10-CM

## 2015-08-24 DIAGNOSIS — J449 Chronic obstructive pulmonary disease, unspecified: Secondary | ICD-10-CM | POA: Diagnosis not present

## 2015-08-24 DIAGNOSIS — R059 Cough, unspecified: Secondary | ICD-10-CM

## 2015-08-24 LAB — POCT CBC
GRANULOCYTE PERCENT: 71.5 % (ref 37–80)
HEMATOCRIT: 35.2 % — AB (ref 37.7–47.9)
HEMOGLOBIN: 12 g/dL — AB (ref 12.2–16.2)
LYMPH, POC: 1.9 (ref 0.6–3.4)
MCH, POC: 33.3 pg — AB (ref 27–31.2)
MCHC: 34.2 g/dL (ref 31.8–35.4)
MCV: 97.2 fL — AB (ref 80–97)
MID (cbc): 0.6 (ref 0–0.9)
MPV: 5.5 fL (ref 0–99.8)
POC GRANULOCYTE: 6.4 (ref 2–6.9)
POC LYMPH PERCENT: 21.9 %L (ref 10–50)
POC MID %: 6.6 % (ref 0–12)
Platelet Count, POC: 254 10*3/uL (ref 142–424)
RBC: 3.62 M/uL — AB (ref 4.04–5.48)
RDW, POC: 12.5 %
WBC: 8.9 10*3/uL (ref 4.6–10.2)

## 2015-08-24 LAB — POCT SEDIMENTATION RATE: POCT SED RATE: 79 mm/hr — AB (ref 0–22)

## 2015-08-24 MED ORDER — HYDROCODONE-ACETAMINOPHEN 10-325 MG PO TABS: 0.5000 | ORAL_TABLET | Freq: Two times a day (BID) | ORAL | Status: DC | PRN

## 2015-08-24 MED ORDER — PREDNISONE 20 MG PO TABS: 40.0000 mg | ORAL_TABLET | Freq: Every day | ORAL | Status: DC

## 2015-08-24 MED ORDER — ALBUTEROL SULFATE (2.5 MG/3ML) 0.083% IN NEBU
2.5000 mg | INHALATION_SOLUTION | Freq: Once | RESPIRATORY_TRACT | Status: AC
  Administered 2015-08-24: 2.5 mg via RESPIRATORY_TRACT

## 2015-08-24 MED ORDER — LORAZEPAM 0.5 MG PO TABS: ORAL_TABLET | ORAL | Status: DC

## 2015-08-24 MED ORDER — HYDROCODONE-ACETAMINOPHEN 10-325 MG PO TABS: ORAL_TABLET | ORAL | Status: DC

## 2015-08-24 MED ORDER — BENZONATATE 200 MG PO CAPS: 200.0000 mg | ORAL_CAPSULE | Freq: Three times a day (TID) | ORAL | Status: DC | PRN

## 2015-08-24 MED ORDER — CETIRIZINE HCL 10 MG PO TABS: 10.0000 mg | ORAL_TABLET | Freq: Every day | ORAL | Status: DC

## 2015-08-24 MED ORDER — DOXYCYCLINE HYCLATE 100 MG PO CAPS: 100.0000 mg | ORAL_CAPSULE | Freq: Two times a day (BID) | ORAL | Status: DC

## 2015-08-24 MED ORDER — IPRATROPIUM BROMIDE 0.02 % IN SOLN
0.5000 mg | Freq: Once | RESPIRATORY_TRACT | Status: AC
Start: 2015-08-24 — End: 2015-08-24
  Administered 2015-08-24: 0.5 mg via RESPIRATORY_TRACT

## 2015-08-24 NOTE — Progress Notes (Signed)
Subjective:  By signing my name below, I, Cristina Parker, attest that this documentation has been prepared under the direction and in the presence of Delman Cheadle, MD Electronically Signed: Ladene Artist, ED Scribe 08/24/2015 at 2:25 PM.   Patient ID: Cristina Parker, female    DOB: 02/07/1936, 80 y.o.   MRN: [id number]  Chief Complaint  Patient presents with  . Cough    chest congestion, yellow mucus/ x 1 week   HPI HPI Comments: Cristina Parker is a 80 y.o. female who presents to the [facility] complaining of persistent cough for 1 week. Pt is followed closely by Dr. Melvyn Novas for fibrosis and chronic respiratory failure. Distant smoking hx; quit in 1998. Pt is on continuous 2 L O2 which she increases to 3 L with activity. Pt was also started on chronic prednisone 20-10 mg daily. Her O2 levels drop to 89% on O2 with activity at baseline. Pt was referred to Dr. Chase Caller last month due to interstitial PNA. She was taken off chronic prednisone 1 month ago due to no significant improvement. Fibrosis seems to be steroid resistant. Pt did receive a flu vaccine this year.   Today, pt presents with persistent productive cough with green-yellow sputum for the past 2 weeks. Pt reports associated subjective fever and diaphoresis. Pt denies chills and worsened SOB. She has tried Mucinex and drinking more fluids without significant relief. Pt has not used her inhalers or done a nebulizer treatment. States that she stopped Prednisone approximately 1 month ago. No antibiotic allergies. The last antibiotic that she was on was a z-pak. Her next appointment with Dr. Chase Caller on 09/15/15.   Medication Refill  Pt requests a medication refill of lorazepam that she takes x2-3 weekly to help her sleep at night.  Past Medical History  Diagnosis Date  . Heart disease   . Crohn's disease ([place])   . Duodenal stricture   . Esophageal reflux   . Hypertension   . Hyperlipemia   . Hiatal hernia   .  Pneumonia   . Anemia   . Depression   . Collagenous colitis   . Anxiety   . Paroxysmal a-fib ([place]) 07/09/2011  . Peripheral vascular disease ([place])   . Blood transfusion without reported diagnosis   . Duodenal ulcer   . Arthritis   . Cataract     BILATERAL  . Heart murmur   . Osteoporosis    Current Outpatient Prescriptions on File Prior to Visit  Medication Sig Dispense Refill  . Ascorbic Acid (VITAMIN C) 1000 MG tablet Take 1,000 mg by mouth daily.    Marland Kitchen aspirin 81 MG tablet Take 81 mg by mouth every morning.     Marland Kitchen atorvastatin (LIPITOR) 40 MG tablet Take 1 tablet (40 mg total) by mouth daily. 90 tablet 3  . clopidogrel (PLAVIX) 75 MG tablet Take 1 tablet (75 mg total) by mouth at bedtime. 90 tablet 3  . diphenhydrAMINE (SOMINEX) 25 MG tablet Take 25 mg by mouth at bedtime as needed for allergies.    Marland Kitchen escitalopram (LEXAPRO) 20 MG tablet Take 1 tablet (20 mg total) by mouth daily. 90 tablet 3  . HYDROcodone-acetaminophen (NORCO) 10-325 MG tablet Take 1/2 tablet every 8- 12 hours as needed for pain May fill 30 days after rx date 30 tablet 0  . LORazepam (ATIVAN) 0.5 MG tablet TAKE 1/2 TABLET BY MOUTH TWICE DAILY AS NEEDED FOR ANXIETY 30 tablet 0  . LUMIGAN 0.01 % SOLN Apply  1 drop in both eyes every night    . metoprolol succinate (TOPROL-XL) 50 MG 24 hr tablet Take 1 tablet (50 mg total) by mouth daily. with or immediately following a meal. 90 tablet 3  . OXYGEN Oxygen 2lpm with rest and 3 lpm with exertion    . pantoprazole (PROTONIX) 40 MG tablet Take 40 mg by mouth daily.    . Albuterol Sulfate (PROAIR RESPICLICK) 629 (90 BASE) MCG/ACT AEPB Inhale 2 puffs into the lungs every 4 (four) hours as needed (SHORTNESS OF BREATH). Reported on 08/24/2015    . Chlorphen-Pyril-Phenyleph (PHENA-PLUS PO) Take 1 tablet by mouth daily.    . predniSONE (DELTASONE) 10 MG tablet Reported on 08/24/2015    . Vitamin D, Ergocalciferol, (DRISDOL) 50000 UNITS CAPS capsule Take 1 capsule (50,000 Units total)  by mouth 3 (three) times a week. (Patient not taking: Reported on 08/24/2015) 12 capsule 0   No current facility-administered medications on file prior to visit.   No Known Allergies  Review of Systems  Constitutional: Positive for fever (subjective), diaphoresis, activity change and appetite change. Negative for chills.  HENT: Negative for postnasal drip, rhinorrhea, sinus pressure, sore throat and trouble swallowing.   Respiratory: Positive for cough, chest tightness and shortness of breath (crhonic, unchanged). Negative for wheezing.   Cardiovascular: Negative for chest pain.  Musculoskeletal: Positive for back pain and arthralgias.  Hematological: Negative for adenopathy.  Psychiatric/Behavioral: Positive for sleep disturbance. The patient is nervous/anxious.    BP 100/62 mmHg  Pulse 102  Temp(Src) 98 F (36.7 C) (Oral)  Resp 16  Ht _0  (1.499 m)  Wt 124 lb (56.246 kg)  BMI 25.03 kg/m2  SpO2 92%    Objective:   Physical Exam  Constitutional: She is oriented to person, place, and time. She appears well-developed and well-nourished. No distress.  HENT:  Head: Normocephalic and atraumatic.  Eyes: Conjunctivae and EOM are normal.  Neck: Neck supple. No tracheal deviation present.  Cardiovascular: Regular rhythm.  Tachycardia present.   Murmur heard.  Systolic murmur is present with a grade of 2/6  2/6 systolic injection murmur hear at L upper sternal border  Pulmonary/Chest: Effort normal. No respiratory distress. She has rales.  Decreased breath sounds throughout with bibasilar rales.   Musculoskeletal: Normal range of motion.  Neurological: She is alert and oriented to person, place, and time.  Skin: Skin is warm and dry.  Psychiatric: She has a normal mood and affect. Her behavior is normal.  Nursing note and vitals reviewed.     Results for orders placed or performed in visit on 08/24/15  POCT CBC  Result Value Ref Range   WBC 8.9 4.6 - 10.2 K/uL   Lymph, poc 1.9  0.6 - 3.4   POC LYMPH PERCENT 21.9 10 - 50 %L   MID (cbc) 0.6 0 - 0.9   POC MID % 6.6 0 - 12 %M   POC Granulocyte 6.4 2 - 6.9   Granulocyte percent 71.5 37 - 80 %G   RBC 3.62 (A) 4.04 - 5.48 M/uL   Hemoglobin 12.0 (A) 12.2 - 16.2 g/dL   HCT, POC 35.2 (A) 37.7 - 47.9 %   MCV 97.2 (A) 80 - 97 fL   MCH, POC 33.3 (A) 27 - 31.2 pg   MCHC 34.2 31.8 - 35.4 g/dL   RDW, POC 12.5 %   Platelet Count, POC 254 142 - 424 K/uL   MPV 5.5 0 - 99.8 fL  POCT SEDIMENTATION RATE  Result  Value Ref Range   POCT SED RATE 79 (A) 0 - 22 mm/hr     Assessment & Plan:   1. Postinflammatory pulmonary fibrosis ([place])   2. Chronic respiratory failure with hypoxia (HCC)   3. Chronic obstructive pulmonary disease, unspecified COPD type ([place])   4. Cough   5. Chronic back pain     Orders Placed This Encounter  Procedures  . POCT CBC  . POCT SEDIMENTATION RATE    Meds ordered this encounter  Medications  . albuterol (PROVENTIL) (2.5 MG/3ML) 0.083% nebulizer solution 2.5 mg    Sig:   . ipratropium (ATROVENT) nebulizer solution 0.5 mg    Sig:   . LORazepam (ATIVAN) 0.5 MG tablet    Sig: TAKE 1/2 TABLET BY MOUTH TWICE DAILY AS NEEDED FOR ANXIETY    Dispense:  30 tablet    Refill:  3  . benzonatate (TESSALON) 200 MG capsule    Sig: Take 1 capsule (200 mg total) by mouth 3 (three) times daily as needed for cough.    Dispense:  60 capsule    Refill:  0  . cetirizine (ZYRTEC) 10 MG tablet    Sig: Take 1 tablet (10 mg total) by mouth at bedtime.    Dispense:  30 tablet    Refill:  11  . doxycycline (VIBRAMYCIN) 100 MG capsule    Sig: Take 1 capsule (100 mg total) by mouth 2 (two) times daily.    Dispense:  14 capsule    Refill:  0  . HYDROcodone-acetaminophen (NORCO) 10-325 MG tablet    Sig: Take 1/2 tablet every 8- 12 hours as needed for pain May fill 30 days after rx date    Dispense:  30 tablet    Refill:  0  . HYDROcodone-acetaminophen (NORCO) 10-325 MG tablet    Sig: Take 0.5 tablets by mouth  every 12 (twelve) hours as needed. May fill 60d from date written    Dispense:  30 tablet    Refill:  0  . HYDROcodone-acetaminophen (NORCO) 10-325 MG tablet    Sig: Take 0.5 tablets by mouth every 12 (twelve) hours as needed.    Dispense:  30 tablet    Refill:  0    May fill 90 days from date written.  . predniSONE (DELTASONE) 20 MG tablet    Sig: Take 2 tablets (40 mg total) by mouth daily with breakfast.    Dispense:  10 tablet    Refill:  0    I personally performed the services described in this documentation, which was scribed in my presence. The recorded information has been reviewed and considered, and addended by me as needed.  Delman Cheadle, MD MPH

## 2015-08-24 NOTE — Patient Instructions (Signed)
Chronic Obstructive Pulmonary Disease Exacerbation Chronic obstructive pulmonary disease (COPD) is a common lung condition in which airflow from the lungs is limited. COPD is a general term that can be used to describe many different lung problems that limit airflow, including chronic bronchitis and emphysema. COPD exacerbations are episodes when breathing symptoms become much worse and require extra treatment. Without treatment, COPD exacerbations can be life threatening, and frequent COPD exacerbations can cause further damage to your lungs. CAUSES  Respiratory infections.  Exposure to smoke.  Exposure to air pollution, chemical fumes, or dust. Sometimes there is no apparent cause or trigger. RISK FACTORS  Smoking cigarettes.  Older age.  Frequent prior COPD exacerbations. SIGNS AND SYMPTOMS  Increased coughing.  Increased thick spit (sputum) production.  Increased wheezing.  Increased shortness of breath.  Rapid breathing.  Chest tightness. DIAGNOSIS Your medical history, a physical exam, and tests will help your health care provider make a diagnosis. Tests may include:  A chest X-ray.  Basic lab tests.  Sputum testing.  An arterial blood gas test. TREATMENT Depending on the severity of your COPD exacerbation, you may need to be admitted to a hospital for treatment. Some of the treatments commonly used to treat COPD exacerbations are:   Antibiotic medicines.  Bronchodilators. These are drugs that expand the air passages. They may be given with an inhaler or nebulizer. Spacer devices may be needed to help improve drug delivery.  Corticosteroid medicines.  Supplemental oxygen therapy.  Airway clearing techniques, such as noninvasive ventilation (NIV) and positive expiratory pressure (PEP). These provide respiratory support through a mask or other noninvasive device. HOME CARE INSTRUCTIONS  Do not smoke. Quitting smoking is very important to prevent COPD from  getting worse and exacerbations from happening as often.  Avoid exposure to all substances that irritate the airway, especially to tobacco smoke.  If you were prescribed an antibiotic medicine, finish it all even if you start to feel better.  Take all medicines as directed by your health care provider.It is important to use correct technique with inhaled medicines.  Drink enough fluids to keep your urine clear or pale yellow (unless you have a medical condition that requires fluid restriction).  Use a cool mist vaporizer. This makes it easier to clear your chest when you cough.  If you have a home nebulizer and oxygen, continue to use them as directed.  Maintain all necessary vaccinations to prevent infections.  Exercise regularly.  Eat a healthy diet.  Keep all follow-up appointments as directed by your health care provider. SEEK IMMEDIATE MEDICAL CARE IF:  You have worsening shortness of breath.  You have trouble talking.  You have severe chest pain.  You have blood in your sputum.  You have a fever.  You have weakness, vomit repeatedly, or faint.  You feel confused.  You continue to get worse. MAKE SURE YOU:  Understand these instructions.  Will watch your condition.  Will get help right away if you are not doing well or get worse.   This information is not intended to replace advice given to you by your health care provider. Make sure you discuss any questions you have with your health care provider.   Document Released: 04/01/2007 Document Revised: 06/25/2014 Document Reviewed: 02/06/2013 Elsevier Interactive Patient Education [employer].

## 2015-09-13 DIAGNOSIS — J449 Chronic obstructive pulmonary disease, unspecified: Secondary | ICD-10-CM | POA: Diagnosis not present

## 2015-09-15 ENCOUNTER — Ambulatory Visit (INDEPENDENT_AMBULATORY_CARE_PROVIDER_SITE_OTHER): Payer: Medicare Other | Admitting: Internal Medicine

## 2015-09-15 ENCOUNTER — Other Ambulatory Visit (INDEPENDENT_AMBULATORY_CARE_PROVIDER_SITE_OTHER): Payer: Medicare Other

## 2015-09-15 ENCOUNTER — Encounter: Payer: Self-pay | Admitting: Internal Medicine

## 2015-09-15 VITALS — BP 98/68 | HR 66 | Ht 59.0 in | Wt 125.6 lb

## 2015-09-15 DIAGNOSIS — J9611 Chronic respiratory failure with hypoxia: Secondary | ICD-10-CM | POA: Diagnosis not present

## 2015-09-15 DIAGNOSIS — J849 Interstitial pulmonary disease, unspecified: Secondary | ICD-10-CM | POA: Diagnosis not present

## 2015-09-15 LAB — CARDIAC PANEL
CK TOTAL: 44 U/L (ref 7–177)
CK-MB: 1.5 ng/mL (ref 0.3–4.0)
Relative Index: 3.4 calc — ABNORMAL HIGH (ref 0.0–2.5)

## 2015-09-15 LAB — RHEUMATOID FACTOR

## 2015-09-15 LAB — SEDIMENTATION RATE: SED RATE: 32 mm/h — AB (ref 0–22)

## 2015-09-15 NOTE — Patient Instructions (Signed)
ICD-9-CM ICD-10-CM   1. Chronic respiratory failure with hypoxia (HCC) 518.83 J96.11    799.02    2. ILD (interstitial lung disease) ([place]) 515 J84.9    We are trying to narrow down your ILD/ fibrosis type  - Differential diagnosis includes IPF, hypersensitivity pneumonitis, and NSIP fibrotic variant  Do ACCP ILD questions  Serum: ESR, ACE,  RF, anti-CCP, ssA, ssB, scl-70, PR-3, Total CK,  RNP, Aldolase,  Hypersensitivity Pneumonitis Panel  Repeat HRCT  - prone and supine  Repeat full PFT  Folllwup  - return to see me after above - next few to several weeks  - Till then continue oxygen as before

## 2015-09-15 NOTE — Progress Notes (Signed)
Subjective:    Patient ID: Cristina Parker, female    DOB: Oct 28, 1935, 80 y.o.   MRN: [id number]  HPI  OV 09/15/2015  Chief Complaint  Patient presents with  . Pulmonary Consult    MW pt here for second opinion for pulmonary fibrosis. Pt c/o DOE. Pt deneis cough, CP/tightness and wheezing. Pt wearing 2lpm pulsed.    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 She is a 35 pack smoking history in the past. In 2012 she had normal CT scan of the chest. Then for the last few years she's had insidious onset of dyspnea that has been progressive in the last 1 year. Since end of last year is been on 3 L oxygen with exertion 2 L at rest. She feels dyspnea is progressive. It is present on exertion relieved by rest. 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 She had limited autoimmune workup summer 2016 including ANA, rheumatoid factor and CCP antibody and these were normal. She was tried on a course of prednisone since September 2016 till early 2017 but this did not help. This I confirmed on chart review and talking to her.  She is with a friend Juliann Pulse wife was also retired Marine scientist from the hospital.  Brewing technologist ILD questionnaire - Symptoms: She will occasionally coughs. It is not bothersome. She does not cough at night and the cough is dry. She has been dyspneic at a level IV-V which means that she is too breathless to leave the house and she is dyspneic when she dresses. She's been dyspneic for 1-2 years at this level - Past medical: She has heart disease hepatitis history of pleurisy and pneumonia acid reflux dysphagia sensitivity to light. 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 Denies any exposure to occupational lung disease -Personal exposure history: Denies taking  any street drugs but did smoke cigarettes from age 88 1 pack a day and quit when she was 80 years old. -Family history of lung disease: Denies -Environmental history: She lives in an old house. There is no humidifier exposure. No Jacuzzi. No birds. -    has a past medical history of Heart disease; Crohn's disease ([place]); Duodenal stricture; Esophageal reflux; Hypertension; Hyperlipemia; Hiatal hernia; Pneumonia; Anemia; Depression; Collagenous colitis; Anxiety; Paroxysmal a-fib ([place]) (07/09/2011); Peripheral vascular disease ([place]); Blood transfusion without reported diagnosis; Duodenal ulcer; Arthritis; Cataract; Heart murmur; and Osteoporosis.   reports that she quit smoking about 19 years ago. Her smoking use included Cigarettes. She has a 35 pack-year smoking history. She has never used smokeless tobacco.  Past Surgical History  Procedure Laterality Date  . Coronary artery bypass graft    . Hip surgery      right  . Abdominal hysterectomy    . Cervical disc surgery    . Bunionectomy  1977    both feet   . Fracture surgery  1998    ORIF  . Eye surgery  10/2010    left eye cataract  . Carotid endarterectomy  05-1998    Left CEA  . Carotid endarterectomy  04/11/11    Right CEA  . Aorto- innominate bpg  2005    for innominate artery occlusive disease  . Colonoscopy    . Cesarean section    . Tubal ligation  No Known Allergies  Immunization History  Administered Date(s) Administered  . Hepatitis B 06/18/2001  . Influenza Split 03/19/2011, 02/28/2012  . Influenza,inj,Quad PF,36+ Mos 03/13/2013, 04/09/2014, 03/09/2015  . Pneumococcal Conjugate-13 05/21/2014  . Pneumococcal Polysaccharide-23 06/22/2004  . Td 02/02/2010  . Zoster 06/18/1998    Family History  Problem Relation Age of Onset  . Colon polyps Father   . Heart disease Father     Before age 69  . Stroke Father   . Deep vein thrombosis Father   . Colon cancer Neg Hx   . Esophageal cancer Neg Hx   . Rectal  cancer Neg Hx   . Stomach cancer Neg Hx   . Vascular Disease Brother   . Alcoholism Paternal Grandfather   . Heart disease Paternal Grandmother   . Hypertension Mother   . Varicose Veins Mother   . Peripheral vascular disease Mother   . Heart disease Maternal Grandmother   . Osteoporosis Mother      Current outpatient prescriptions:  .  Albuterol Sulfate (PROAIR RESPICLICK) 676 (90 BASE) MCG/ACT AEPB, Inhale 2 puffs into the lungs every 4 (four) hours as needed (SHORTNESS OF BREATH). Reported on 08/24/2015, Disp: , Rfl:  .  Ascorbic Acid (VITAMIN C) 1000 MG tablet, Take 1,000 mg by mouth daily., Disp: , Rfl:  .  aspirin 81 MG tablet, Take 81 mg by mouth every morning. , Disp: , Rfl:  .  atorvastatin (LIPITOR) 40 MG tablet, Take 1 tablet (40 mg total) by mouth daily., Disp: 90 tablet, Rfl: 3 .  cetirizine (ZYRTEC) 10 MG tablet, Take 1 tablet (10 mg total) by mouth at bedtime., Disp: 30 tablet, Rfl: 11 .  clopidogrel (PLAVIX) 75 MG tablet, Take 1 tablet (75 mg total) by mouth at bedtime., Disp: 90 tablet, Rfl: 3 .  escitalopram (LEXAPRO) 20 MG tablet, Take 1 tablet (20 mg total) by mouth daily., Disp: 90 tablet, Rfl: 3 .  HYDROcodone-acetaminophen (NORCO) 10-325 MG tablet, Take 0.5 tablets by mouth every 12 (twelve) hours as needed., Disp: 30 tablet, Rfl: 0 .  LORazepam (ATIVAN) 0.5 MG tablet, TAKE 1/2 TABLET BY MOUTH TWICE DAILY AS NEEDED FOR ANXIETY, Disp: 30 tablet, Rfl: 3 .  LUMIGAN 0.01 % SOLN, Apply 1 drop in both eyes every night, Disp: , Rfl:  .  metoprolol succinate (TOPROL-XL) 50 MG 24 hr tablet, Take 1 tablet (50 mg total) by mouth daily. with or immediately following a meal., Disp: 90 tablet, Rfl: 3 .  OXYGEN, Oxygen 2lpm with rest and 3 lpm with exertion, Disp: , Rfl:  .  pantoprazole (PROTONIX) 40 MG tablet, Take 40 mg by mouth daily., Disp: , Rfl:     Review of Systems  Constitutional: Negative for fever and unexpected weight change.  HENT: Negative for congestion, dental  problem, ear pain, nosebleeds, postnasal drip, rhinorrhea, sinus pressure, sneezing, sore throat and trouble swallowing.   Eyes: Negative for redness and itching.  Respiratory: Positive for shortness of breath. Negative for cough, chest tightness and wheezing.   Cardiovascular: Negative for palpitations and leg swelling.  Gastrointestinal: Negative for nausea and vomiting.  Genitourinary: Negative for dysuria.  Musculoskeletal: Negative for joint swelling.  Skin: Negative for rash.  Neurological: Negative for headaches.  Hematological: Does not bruise/bleed easily.  Psychiatric/Behavioral: Negative for dysphoric mood. The patient is not nervous/anxious.        Objective:   Physical Exam  Filed Vitals:   09/15/15 1030  BP: 98/68  Pulse: 66  Height: _0  (1.499  m)  Weight: 125 lb 9.6 oz (56.972 kg)  SpO2: 96%   Brief exam shows she is obese and has oxygen on. Bilateral bibasal crackles. No edema. Has dry skin. No other evidence of autoimmune disease. Oriented 3. Pleasant      Assessment & Plan:   We are trying to narrow down your ILD/ fibrosis type  - Differential diagnosis includes IPF, hypersensitivity pneumonitis, and NSIP fibrotic variant  Do ACCP ILD questions  Serum: ESR, ACE,  RF, anti-CCP, ssA, ssB, scl-70, PR-3, Total CK,  RNP, Aldolase,  Hypersensitivity Pneumonitis Panel  Repeat HRCT  - prone and supine  Repeat full PFT  Folllwup  - return to see me after above - next few to several weeks  - Till then continue oxygen as before  -     It is possible that with expanded autoimmune workup and repeat high-resolution CT chest that we are able to narrow down the specific type of ILD that she has. However it is equally possible that we are no better in terms of her knowledge. In which case she will have undetermined ILD and technically qualifies for surgical lung biopsy. She might be at risk for combination for surgical lung biopsy. I have told her that I will  plan to address this conversation at follow-up. An alternative might be a research protocol called TRANSCEND that might be available in the future fall 2017 which might be able to rule out IPF with bronchoscopy mediated transbronchial lung biopsies   > 50% of this > 25 min visit spent in face to face counseling or coordination of care    Dr. Brand Males, M.D., [facility].C.P Pulmonary and Critical Care Medicine Staff Physician [facility] Pager: [phone], If no answer or between  15:00h - 7:00h: call [phone]  09/15/2015 11:18 AM

## 2015-09-16 LAB — ANTI-SCLERODERMA ANTIBODY: Scleroderma (Scl-70) (ENA) Antibody, IgG: <1

## 2015-09-16 LAB — CYCLIC CITRUL PEPTIDE ANTIBODY, IGG

## 2015-09-16 LAB — RNP ANTIBODY: RIBONUCLEIC PROTEIN(ENA) ANTIBODY, IGG: NEGATIVE

## 2015-09-16 LAB — SJOGRENS SYNDROME-B EXTRACTABLE NUCLEAR ANTIBODY: SSB (LA) (ENA) ANTIBODY, IGG: NEGATIVE

## 2015-09-16 LAB — SJOGRENS SYNDROME-A EXTRACTABLE NUCLEAR ANTIBODY: SSA (Ro) (ENA) Antibody, IgG: <1

## 2015-09-16 LAB — ANGIOTENSIN CONVERTING ENZYME: Angiotensin-Converting Enzyme: 29 U/L (ref 8–52)

## 2015-09-18 LAB — ALDOLASE: ALDOLASE: 5.4 U/L (ref <=8.1)

## 2015-09-19 LAB — HYPERSENSITIVITY PNEUMONITIS
A. FUMIGATUS #1 ABS: NEGATIVE
A. PULLULANS ABS: NEGATIVE
Micropolyspora faeni, IgG: NEGATIVE
Pigeon Serum Abs: NEGATIVE
THERMOACT. SACCHARII: NEGATIVE
Thermoactinomyces vulgaris, IgG: NEGATIVE

## 2015-09-22 ENCOUNTER — Ambulatory Visit (INDEPENDENT_AMBULATORY_CARE_PROVIDER_SITE_OTHER)
Admission: RE | Admit: 2015-09-22 | Discharge: 2015-09-22 | Disposition: A | Discharge status: Home / Self Care | Payer: Medicare Other | Source: Ambulatory Visit | Attending: Internal Medicine | Admitting: Internal Medicine

## 2015-09-22 DIAGNOSIS — R0602 Shortness of breath: Secondary | ICD-10-CM | POA: Diagnosis not present

## 2015-09-22 DIAGNOSIS — J849 Interstitial pulmonary disease, unspecified: Secondary | ICD-10-CM

## 2015-10-02 ENCOUNTER — Telehealth: Payer: Self-pay | Admitting: Internal Medicine

## 2015-10-02 DIAGNOSIS — R0600 Dyspnea, unspecified: Secondary | ICD-10-CM

## 2015-10-02 NOTE — Telephone Encounter (Signed)
Let Cristina Parker knpw tht CT does not show ILD type disease - just in right lung there is old scar tissue and there is some emphysema in lung. Should not explain all the o2 need. Can explain some of it. So, plan get 2D echo  - do it sometime between now and next OV in may 2017

## 2015-10-03 NOTE — Telephone Encounter (Signed)
Called and spoke with pt. Reviewed results and recs. Pt voiced understanding and had no further questions. Order placed nothing further needed.

## 2015-10-14 DIAGNOSIS — J449 Chronic obstructive pulmonary disease, unspecified: Secondary | ICD-10-CM | POA: Diagnosis not present

## 2015-10-21 ENCOUNTER — Ambulatory Visit (INDEPENDENT_AMBULATORY_CARE_PROVIDER_SITE_OTHER): Payer: Medicare Other | Admitting: Family Medicine

## 2015-10-21 VITALS — BP 124/80 | HR 85 | Temp 98.1°F | Resp 16 | Ht 59.0 in | Wt 124.2 lb

## 2015-10-21 DIAGNOSIS — K047 Periapical abscess without sinus: Secondary | ICD-10-CM

## 2015-10-21 MED ORDER — PENICILLIN V POTASSIUM 500 MG PO TABS: 500.0000 mg | ORAL_TABLET | Freq: Four times a day (QID) | ORAL | Status: DC

## 2015-10-21 MED ORDER — CHLORHEXIDINE GLUCONATE 0.12 % MT SOLN: 15.0000 mL | Freq: Two times a day (BID) | OROMUCOSAL | Status: DC

## 2015-10-21 NOTE — Patient Instructions (Addendum)
     IF you received an x-ray today, you will receive an invoice from [facility]. Please contact [facility] at [phone] with questions or concerns regarding your invoice.   IF you received labwork today, you will receive an invoice from [employer]. Please contact [facility] at [phone] with questions or concerns regarding your invoice.   Our billing staff will not be able to assist you with questions regarding bills from these companies.  You will be contacted with the lab results as soon as they are available. The fastest way to get your results is to activate your My Chart account. Instructions are located on the last page of this paperwork. If you have not heard from [place] regarding the results in 2 weeks, please contact this office.    Decrease the penicillin to twice a day in about a week when the swelling has gone down. Use the peridex twice a day.  Dental Abscess A dental abscess is a collection of pus in or around a tooth. CAUSES This condition is caused by a bacterial infection around the root of the tooth that involves the inner part of the tooth (pulp). It may result from:  Severe tooth decay.  Trauma to the tooth that allows bacteria to enter into the pulp, such as a broken or chipped tooth.  Severe gum disease around a tooth. SYMPTOMS Symptoms of this condition include:  Severe pain in and around the infected tooth.  Swelling and redness around the infected tooth, in the mouth, or in the face.  Tenderness.  Pus drainage.  Bad breath.  Bitter taste in the mouth.  Difficulty swallowing.  Difficulty opening the mouth.  Nausea.  Vomiting.  Chills.  Swollen neck glands.  Fever. DIAGNOSIS This condition is diagnosed with examination of the infected tooth. During the exam, your dentist may tap on the infected tooth. Your dentist will also ask about your medical and dental history and may order  X-rays. TREATMENT This condition is treated by eliminating the infection. This may be done with:  Antibiotic medicine.  A root canal. This may be performed to save the tooth.  Pulling (extracting) the tooth. This may also involve draining the abscess. This is done if the tooth cannot be saved. HOME CARE INSTRUCTIONS  Take medicines only as directed by your dentist.  If you were prescribed antibiotic medicine, finish all of it even if you start to feel better.  Rinse your mouth (gargle) often with salt water to relieve pain or swelling.  Do not drive or operate heavy machinery while taking pain medicine.  Do not apply heat to the outside of your mouth.  Keep all follow-up visits as directed by your dentist. This is important. SEEK MEDICAL CARE IF:  Your pain is worse and is not helped by medicine. SEEK IMMEDIATE MEDICAL CARE IF:  You have a fever or chills.  Your symptoms suddenly get worse.  You have a very bad headache.  You have problems breathing or swallowing.  You have trouble opening your mouth.  You have swelling in your neck or around your eye.   This information is not intended to replace advice given to you by your health care provider. Make sure you discuss any questions you have with your health care provider.   Document Released: 06/04/2005 Document Revised: 10/19/2014 Document Reviewed: 06/01/2014 Elsevier Interactive Patient Education [employer].

## 2015-10-21 NOTE — Progress Notes (Signed)
Subjective:  By signing my name below, I, Raven Small, attest that this documentation has been prepared under the direction and in the presence of Delman Cheadle, MD.  Electronically Signed: Thea Alken, ED Scribe. 10/21/2015. 5:27 PM.  Patient ID: Cristina Parker, female    DOB: August 11, 1935, 80 y.o.   MRN: [id number]  HPI   Chief Complaint  Patient presents with  . Dental Pain    Tooth infection    HPI Comments: Cristina Parker is a 80 y.o. female who presents to the [facility] complaining of dental pain. Pt reports for the past the past few days she's noticed swelling around anterior left upper tooth. She has been rinsing with saline and peroxide. She denies fever, chills, and drainage from tooth. Pt denies drug allergies.  2 months ago pt was on doxycycline with a coarse of penicillin.   Patient Active Problem List   Diagnosis Date Noted  . ILD (interstitial lung disease) ([place]) 09/15/2015  . Vitamin D deficiency 06/03/2015  . Chronic respiratory failure with hypoxia ([place]) 03/16/2015  . Postinflammatory pulmonary fibrosis ([place]) 11/23/2014  . Chronic pain syndrome 11/04/2014  . Carotid stenosis 04/06/2014  . Aftercare following surgery of the circulatory system 04/06/2014  . Cataract 09/25/2012  . Arthritis 09/25/2012  . Depression 09/25/2012  . Osteoporosis 09/25/2012  . Duodenal stricture 09/16/2012  . Murmur 12/19/2011  . Occlusion and stenosis of carotid artery without mention of cerebral infarction 11/15/2011  . Paroxysmal a-fib ([place]) 07/09/2011  . COPD (chronic obstructive pulmonary disease) ([place]) 07/03/2011  . OTHER ULCERATIVE COLITIS 08/09/2009  . PVD 10/31/2007  . HIATAL HERNIA 10/31/2007  . Other and unspecified hyperlipidemia 07/18/2007  . Essential hypertension 07/18/2007  . CORONARY HEART DISEASE 07/18/2007  . VENTRICULAR ARRHYTHMIA 07/18/2007  . LUNG NODULE 07/18/2007  . G E R D 07/18/2007  . CAROTID ENDARTERECTOMY, LEFT, HX OF 07/18/2007    Past Medical History  Diagnosis Date  . Heart disease   . Crohn's disease ([place])   . Duodenal stricture   . Esophageal reflux   . Hypertension   . Hyperlipemia   . Hiatal hernia   . Pneumonia   . Anemia   . Depression   . Collagenous colitis   . Anxiety   . Paroxysmal a-fib ([place]) 07/09/2011  . Peripheral vascular disease ([place])   . Blood transfusion without reported diagnosis   . Duodenal ulcer   . Arthritis   . Cataract     BILATERAL  . Heart murmur   . Osteoporosis    Past Surgical History  Procedure Laterality Date  . Coronary artery bypass graft    . Hip surgery      right  . Abdominal hysterectomy    . Cervical disc surgery    . Bunionectomy  1977    both feet   . Fracture surgery  1998    ORIF  . Eye surgery  10/2010    left eye cataract  . Carotid endarterectomy  05-1998    Left CEA  . Carotid endarterectomy  04/11/11    Right CEA  . Aorto- innominate bpg  2005    for innominate artery occlusive disease  . Colonoscopy    . Cesarean section    . Tubal ligation     No Known Allergies Prior to Admission medications   Medication Sig Start Date End Date Taking? Authorizing Provider  Albuterol Sulfate (PROAIR RESPICLICK) 235 (90 BASE) MCG/ACT AEPB Inhale 2 puffs into the lungs  every 4 (four) hours as needed (SHORTNESS OF BREATH). Reported on 08/24/2015   Yes Historical Provider, MD  Ascorbic Acid (VITAMIN C) 1000 MG tablet Take 1,000 mg by mouth daily.   Yes Historical Provider, MD  aspirin 81 MG tablet Take 81 mg by mouth every morning.    Yes Historical Provider, MD  atorvastatin (LIPITOR) 40 MG tablet Take 1 tablet (40 mg total) by mouth daily. 11/04/14  Yes Barton Fanny, MD  cetirizine (ZYRTEC) 10 MG tablet Take 1 tablet (10 mg total) by mouth at bedtime. 08/24/15  Yes Shawnee Knapp, MD  clopidogrel (PLAVIX) 75 MG tablet Take 1 tablet (75 mg total) by mouth at bedtime. 11/04/14  Yes Barton Fanny, MD  escitalopram (LEXAPRO) 20 MG tablet Take 1 tablet  (20 mg total) by mouth daily. 11/04/14  Yes Barton Fanny, MD  HYDROcodone-acetaminophen [facility]) 10-325 MG tablet Take 0.5 tablets by mouth every 12 (twelve) hours as needed. 08/24/15  Yes Shawnee Knapp, MD  LORazepam (ATIVAN) 0.5 MG tablet TAKE 1/2 TABLET BY MOUTH TWICE DAILY AS NEEDED FOR ANXIETY 08/24/15  Yes Shawnee Knapp, MD  LUMIGAN 0.01 % SOLN Apply 1 drop in both eyes every night 03/13/15  Yes Historical Provider, MD  metoprolol succinate (TOPROL-XL) 50 MG 24 hr tablet Take 1 tablet (50 mg total) by mouth daily. with or immediately following a meal. 06/30/15  Yes Jessica C Copland, MD  OXYGEN Oxygen 2lpm with rest and 3 lpm with exertion   Yes Historical Provider, MD  pantoprazole (PROTONIX) 40 MG tablet Take 40 mg by mouth daily.   Yes Historical Provider, MD   Social History   Social History  . Marital Status: Widowed    Spouse Name: N/A  . Number of Children: 2  . Years of Education: N/A   Occupational History  . retired Therapist, sports    Social History Main Topics  . Smoking status: Former Smoker -- 1.00 packs/day for 35 years    Types: Cigarettes    Quit date: 06/18/1996  . Smokeless tobacco: Never Used  . Alcohol Use: 4.2 - 8.4 oz/week    7-14 Standard drinks or equivalent per week     Comment: WINE  . Drug Use: No  . Sexual Activity: No   Other Topics Concern  . Not on file   Social History Narrative   Retired Marine scientist;   Lives with son and grandson   Review of Systems  Constitutional: Negative for fever, chills, diaphoresis, activity change and appetite change.  HENT: Positive for dental problem and facial swelling. Negative for drooling, sinus pressure, sneezing, sore throat and trouble swallowing.   Respiratory: Positive for cough and shortness of breath.   Gastrointestinal: Negative for nausea, vomiting and abdominal pain.  Genitourinary: Negative for vaginal discharge.  Allergic/Immunologic: Positive for immunocompromised state.  Neurological: Positive for facial asymmetry.     Objective:   Physical Exam  Constitutional: She is oriented to person, place, and time. She appears well-developed and well-nourished. No distress.  HENT:  Head: Normocephalic and atraumatic.  Mouth/Throat: Dental caries present.  Swelling over the upper lip just left to mid line. Roots of anterior teeth have caries and appear to be infected. Surrounding gums are red and swollen   Eyes: Conjunctivae and EOM are normal.  Neck: Neck supple.  Cardiovascular: Normal rate.   Pulmonary/Chest: Effort normal.  Musculoskeletal: Normal range of motion.  Neurological: She is alert and oriented to person, place, and time.  Skin: Skin is  warm and dry.  Psychiatric: She has a normal mood and affect. Her behavior is normal.  Nursing note and vitals reviewed.  Filed Vitals:   10/21/15 1602  BP: 124/80  Pulse: 85  Temp: 98.1 F (36.7 C)  TempSrc: Oral  Resp: 16  Height: _0  (1.499 m)  Weight: 124 lb 3.2 oz (56.337 kg)  SpO2: 92%    Assessment & Plan:   1. Dental abscess   Decrease pcn to bid in about 1 wk after swelling and pain improve, cont until dental appt so that hopefully she can have rotted root removed at that time.   Meds ordered this encounter  Medications  . penicillin v potassium (VEETID) 500 MG tablet    Sig: Take 1 tablet (500 mg total) by mouth 4 (four) times daily.    Dispense:  90 tablet    Refill:  0  . chlorhexidine (PERIDEX) 0.12 % solution    Sig: Use as directed 15 mLs in the mouth or throat 2 (two) times daily.    Dispense:  473 mL    Refill:  0   I personally performed the services described in this documentation, which was scribed in my presence. The recorded information has been reviewed and considered, and addended by me as needed.  Delman Cheadle, MD MPH

## 2015-10-27 ENCOUNTER — Encounter: Payer: Medicare Other | Admitting: Family Medicine

## 2015-11-03 ENCOUNTER — Ambulatory Visit (HOSPITAL_COMMUNITY): Payer: Medicare Other | Attending: Cardiology

## 2015-11-03 ENCOUNTER — Other Ambulatory Visit: Payer: Self-pay

## 2015-11-03 DIAGNOSIS — Z87891 Personal history of nicotine dependence: Secondary | ICD-10-CM | POA: Diagnosis not present

## 2015-11-03 DIAGNOSIS — R0600 Dyspnea, unspecified: Secondary | ICD-10-CM | POA: Diagnosis not present

## 2015-11-03 DIAGNOSIS — E785 Hyperlipidemia, unspecified: Secondary | ICD-10-CM | POA: Diagnosis not present

## 2015-11-03 DIAGNOSIS — I4891 Unspecified atrial fibrillation: Secondary | ICD-10-CM | POA: Insufficient documentation

## 2015-11-03 DIAGNOSIS — Z8249 Family history of ischemic heart disease and other diseases of the circulatory system: Secondary | ICD-10-CM | POA: Diagnosis not present

## 2015-11-03 DIAGNOSIS — K5090 Crohn's disease, unspecified, without complications: Secondary | ICD-10-CM | POA: Diagnosis not present

## 2015-11-03 DIAGNOSIS — I739 Peripheral vascular disease, unspecified: Secondary | ICD-10-CM | POA: Insufficient documentation

## 2015-11-03 DIAGNOSIS — I10 Essential (primary) hypertension: Secondary | ICD-10-CM | POA: Insufficient documentation

## 2015-11-03 DIAGNOSIS — I081 Rheumatic disorders of both mitral and tricuspid valves: Secondary | ICD-10-CM | POA: Insufficient documentation

## 2015-11-04 ENCOUNTER — Telehealth: Payer: Self-pay | Admitting: Internal Medicine

## 2015-11-04 DIAGNOSIS — R931 Abnormal findings on diagnostic imaging of heart and coronary circulation: Secondary | ICD-10-CM

## 2015-11-04 NOTE — Telephone Encounter (Signed)
Echo 11/03/15 shows Diast dysfhn grade 2 with significant PASP elevaltion 60s - refer cards - Dr Loralie Champagne - neesd right heart cath

## 2015-11-08 NOTE — Telephone Encounter (Signed)
Spoke with pt, aware of results/recs.  Referral to Dr. Aundra Dubin ordered.  Nothing further needed.

## 2015-11-10 ENCOUNTER — Ambulatory Visit (INDEPENDENT_AMBULATORY_CARE_PROVIDER_SITE_OTHER): Payer: Medicare Other | Admitting: Internal Medicine

## 2015-11-10 ENCOUNTER — Encounter: Payer: Self-pay | Admitting: Internal Medicine

## 2015-11-10 VITALS — BP 104/70 | HR 77 | Ht 59.0 in | Wt 125.0 lb

## 2015-11-10 DIAGNOSIS — R0989 Other specified symptoms and signs involving the circulatory and respiratory systems: Secondary | ICD-10-CM | POA: Insufficient documentation

## 2015-11-10 DIAGNOSIS — R0600 Dyspnea, unspecified: Secondary | ICD-10-CM | POA: Diagnosis not present

## 2015-11-10 DIAGNOSIS — J9611 Chronic respiratory failure with hypoxia: Secondary | ICD-10-CM

## 2015-11-10 DIAGNOSIS — J849 Interstitial pulmonary disease, unspecified: Secondary | ICD-10-CM | POA: Diagnosis not present

## 2015-11-10 LAB — PULMONARY FUNCTION TEST
DL/VA % PRED: 42 %
DL/VA: 1.74 ml/min/mmHg/L
DLCO UNC % PRED: 25 %
DLCO UNC: 4.49 ml/min/mmHg
DLCO cor % pred: 25 %
DLCO cor: 4.49 ml/min/mmHg
FEF 25-75 PRE: 0.93 L/s
FEF 25-75 Post: 1.01 L/sec
FEF2575-%CHANGE-POST: 8 %
FEF2575-%Pred-Post: 83 %
FEF2575-%Pred-Pre: 76 %
FEV1-%CHANGE-POST: 0 %
FEV1-%PRED-POST: 64 %
FEV1-%PRED-PRE: 64 %
FEV1-PRE: 0.99 L
FEV1-Post: 1 L
FEV1FVC-%Change-Post: 0 %
FEV1FVC-%Pred-Pre: 110 %
FEV6-%Change-Post: 0 %
FEV6-%PRED-POST: 61 %
FEV6-%PRED-PRE: 61 %
FEV6-POST: 1.22 L
FEV6-PRE: 1.21 L
FEV6FVC-%PRED-POST: 106 %
FEV6FVC-%PRED-PRE: 106 %
FVC-%Change-Post: 0 %
FVC-%Pred-Post: 58 %
FVC-%Pred-Pre: 57 %
FVC-Post: 1.22 L
FVC-Pre: 1.21 L
PRE FEV6/FVC RATIO: 100 %
Post FEV1/FVC ratio: 82 %
Post FEV6/FVC ratio: 100 %
Pre FEV1/FVC ratio: 82 %
RV % PRED: 116 %
RV: 2.47 L
TLC % pred: 97 %
TLC: 4.18 L

## 2015-11-10 MED ORDER — TIOTROPIUM BROMIDE MONOHYDRATE 2.5 MCG/ACT IN AERS: 2.0000 | INHALATION_SPRAY | Freq: Every day | RESPIRATORY_TRACT | Status: DC

## 2015-11-10 NOTE — Addendum Note (Signed)
Addended by: Len Blalock on: 11/10/2015 02:08 PM   Modules accepted: Orders

## 2015-11-10 NOTE — Addendum Note (Signed)
Addended by: Len Blalock on: 11/10/2015 02:06 PM   Modules accepted: Orders

## 2015-11-10 NOTE — Progress Notes (Signed)
Subjective:     Patient ID: Cristina Parker, female   DOB: 11/08/1935, 80 y.o.   MRN: [id number]  HPI    OV 09/15/2015  Chief Complaint  Patient presents with  . Pulmonary Consult    MW pt here for second opinion for pulmonary fibrosis. Pt c/o DOE. Pt deneis cough, CP/tightness and wheezing. Pt wearing 2lpm pulsed.    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 She is a 35 pack smoking history in the past. In 2012 she had normal CT scan of the chest. Then for the last few years she's had insidious onset of dyspnea that has been progressive in the last 1 year. Since end of last year is been on 3 L oxygen with exertion 2 L at rest. She feels dyspnea is progressive. It is present on exertion relieved by rest. 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 She had limited autoimmune workup summer 2016 including ANA, rheumatoid factor and CCP antibody and these were normal. She was tried on a course of prednisone since September 2016 till early 2017 but this did not help. This I confirmed on chart review and talking to her.  She is with a friend Kathy wife was also retired nurse from the hospital.  [employer] chest physicians ILD questionnaire - Symptoms: She will occasionally coughs. It is not bothersome. She does not cough at night and the cough is dry. She has been dyspneic at a level IV-V which means that she is too breathless to leave the house and she is dyspneic when she dresses. She's been dyspneic for 1-2 years at this level - Past medical: She has heart disease hepatitis history of pleurisy and pneumonia acid reflux dysphagia sensitivity to light. She has a history of ulcerative colitis - Personal occupational history: She is a nurse. Denies any exposure to occupational lung disease -Personal exposure history: Denies taking  any street drugs but did smoke cigarettes from age 28 1 pack a day and quit when she was 80 years old. -Family history of lung disease: Denies -Environmental history: She lives in an old house. There is no humidifier exposure. No Jacuzzi. No birds. -   OV 11/10/2015  Chief Complaint  Patient presents with  . Follow-up    Review PFT. Using 2-3 liters O2 and at night. Denies any current breathing issues out of her normal range.     Follow dyspnea with the suspicion of interstitial lung disease in the setting of chronic hypoxemic respiratory failure.  She continues to use oxygen. She is here to review results. They're all listed below  Pulmonary function test today shows restriction with low diffusion although her lung capacity is normal. The severity of reduced diffusion is extreme while the severity of restriction as moderate on the spirometry but normal in the lung volumes. CT scan of the chest surprisingly does not show any interstitial lung disease but Echo shows basilar dysfunction grade 2 with significant pulmonary artery systolic pressure elevation at 60 mm. The medical referral to Dr. McLean for right heart catheterization with this is pending. Autoimmune blood tests March 2017 is normal.  IMPRESSION: HRCT 4/617 1. Pulmonary parenchymal scarring in the right hemi thorax. No definitive evidence of interstitial lung disease. 2. Right adrenal adenoma.   Electronically Signed  By: Melinda Blietz M.D.  On: 09/26/2015 08:27    Echo 11/03/15 shows Diast dysfhn grade 2 with significant PASP elevaltion 60s -         has a past medical history of Heart disease; Crohn's disease (HCC); Duodenal stricture; Esophageal reflux; Hypertension; Hyperlipemia; Hiatal hernia; Pneumonia; Anemia; Depression; Collagenous colitis; Anxiety; Paroxysmal a-fib (HCC) (07/09/2011); Peripheral vascular disease (HCC); Blood transfusion without reported diagnosis; Duodenal ulcer; Arthritis; Cataract; Heart  murmur; and Osteoporosis.   reports that she quit smoking about 19 years ago. Her smoking use included Cigarettes. She has a 35 pack-year smoking history. She has never used smokeless tobacco.  Past Surgical History  Procedure Laterality Date  . Coronary artery bypass graft    . Hip surgery      right  . Abdominal hysterectomy    . Cervical disc surgery    . Bunionectomy  1977    both feet   . Fracture surgery  1998    ORIF  . Eye surgery  10/2010    left eye cataract  . Carotid endarterectomy  05-1998    Left CEA  . Carotid endarterectomy  04/11/11    Right CEA  . Aorto- innominate bpg  2005    for innominate artery occlusive disease  . Colonoscopy    . Cesarean section    . Tubal ligation      No Known Allergies  Immunization History  Administered Date(s) Administered  . Hepatitis B 06/18/2001  . Influenza Split 03/19/2011, 02/28/2012  . Influenza,inj,Quad PF,36+ Mos 03/13/2013, 04/09/2014, 03/09/2015  . Pneumococcal Conjugate-13 05/21/2014  . Pneumococcal Polysaccharide-23 06/22/2004  . Td 02/02/2010  . Zoster 06/18/1998    Family History  Problem Relation Age of Onset  . Colon polyps Father   . Heart disease Father     Before age 60  . Stroke Father   . Deep vein thrombosis Father   . Colon cancer Neg Hx   . Esophageal cancer Neg Hx   . Rectal cancer Neg Hx   . Stomach cancer Neg Hx   . Vascular Disease Brother   . Alcoholism Paternal Grandfather   . Heart disease Paternal Grandmother   . Hypertension Mother   . Varicose Veins Mother   . Peripheral vascular disease Mother   . Heart disease Maternal Grandmother   . Osteoporosis Mother      Current outpatient prescriptions:  .  Albuterol Sulfate (PROAIR RESPICLICK) 108 (90 BASE) MCG/ACT AEPB, Inhale 2 puffs into the lungs every 4 (four) hours as needed (SHORTNESS OF BREATH). Reported on 08/24/2015, Disp: , Rfl:  .  Ascorbic Acid (VITAMIN C) 1000 MG tablet, Take 1,000 mg by mouth daily., Disp: , Rfl:  .   aspirin 81 MG tablet, Take 81 mg by mouth every morning. , Disp: , Rfl:  .  atorvastatin (LIPITOR) 40 MG tablet, Take 1 tablet (40 mg total) by mouth daily., Disp: 90 tablet, Rfl: 3 .  cetirizine (ZYRTEC) 10 MG tablet, Take 1 tablet (10 mg total) by mouth at bedtime., Disp: 30 tablet, Rfl: 11 .  clopidogrel (PLAVIX) 75 MG tablet, Take 1 tablet (75 mg total) by mouth at bedtime., Disp: 90 tablet, Rfl: 3 .  escitalopram (LEXAPRO) 20 MG tablet, Take 1 tablet (20 mg total) by mouth daily., Disp: 90 tablet, Rfl: 3 .  HYDROcodone-acetaminophen (NORCO) 10-325 MG tablet, Take 0.5 tablets by mouth every 12 (twelve) hours as needed., Disp: 30 tablet, Rfl: 0 .  LORazepam (ATIVAN) 0.5 MG tablet, TAKE 1/2 TABLET BY MOUTH TWICE DAILY AS NEEDED FOR ANXIETY, Disp: 30 tablet,   Rfl: 3 .  LUMIGAN 0.01 % SOLN, Apply 1 drop in both eyes every night, Disp: , Rfl:  .  metoprolol succinate (TOPROL-XL) 50 MG 24 hr tablet, Take 1 tablet (50 mg total) by mouth daily. with or immediately following a meal., Disp: 90 tablet, Rfl: 3 .  OXYGEN, Oxygen 2lpm with rest and 3 lpm with exertion, Disp: , Rfl:  .  pantoprazole (PROTONIX) 40 MG tablet, Take 40 mg by mouth daily., Disp: , Rfl:  .  penicillin v potassium (VEETID) 500 MG tablet, Take 1 tablet (500 mg total) by mouth 4 (four) times daily., Disp: 90 tablet, Rfl: 0    Review of Systems     Objective:   Physical Exam Filed Vitals:   11/10/15 1342  BP: 104/70  Pulse: 77   Loud P2 present. Clear to auscultation bilaterally. Is mainly a discussion visit. She is oriented 3 and alert      Assessment:       ICD-9-CM ICD-10-CM   1. Pulmonary hyperinflation 786.9 R09.89   2. Dyspnea 786.09 R06.00   3. Chronic respiratory failure with hypoxia (HCC) 518.83 J96.11    799.02          Plan:     She has pulmonary hypertension that can explain  rer extreme low diffusion but a spirometry in lung volumes suggest that she has interstitial lung disease with emphysema but  the CT scan doesn't reveal either disease which is surprising. So for now we will give her empiric Spiriva. We will make sure she has an appointment with Dr. McLean for right heart catheterization. She is agreeable with the plan   (> 50% of this 15 min visit spent in face to face counseling or/and coordination of care)   Dr. Kenika Sahm, M.D., F.C.C.P Pulmonary and Critical Care Medicine Staff Physician [facility] Pager: [phone], If no answer or between  15:00h - 7:00h: call [phone]  11/10/2015 2:03 PM            

## 2015-11-10 NOTE — Patient Instructions (Signed)
ICD-9-CM ICD-10-CM   1. Pulmonary hyperinflation 786.9 R09.89   2. Dyspnea 786.09 R06.00   3. Chronic respiratory failure with hypoxia (HCC) 518.83 J96.11    799.02     Try empiric spiriva respimat inhaler scheduled daily - take sample cotniue oxygen Meet with [facility] To ensure you have appt with Dr Loralie Champagne so he can set right heart cath  Followup 6 weeks - hopefully by them Right heart cath done

## 2015-11-10 NOTE — Progress Notes (Signed)
Patient seen in the office today and instructed on use of Spiriva Respimat.  Patient expressed understanding and demonstrated technique.

## 2015-11-10 NOTE — Progress Notes (Signed)
PFT done today. 

## 2015-11-13 DIAGNOSIS — J449 Chronic obstructive pulmonary disease, unspecified: Secondary | ICD-10-CM | POA: Diagnosis not present

## 2015-11-18 ENCOUNTER — Encounter: Payer: Self-pay | Admitting: Gastroenterology

## 2015-11-28 ENCOUNTER — Telehealth (HOSPITAL_COMMUNITY): Payer: Self-pay | Admitting: Vascular Surgery

## 2015-11-28 NOTE — Telephone Encounter (Signed)
Ok to schedule RHC first next week then appt afterwards if she has pulmonary hypertension confirmed.  If she prefers office visit first, that is fine too.

## 2015-11-28 NOTE — Telephone Encounter (Signed)
Will forward to Dr. Aundra Dubin to see if he would like to see patient for regular apt first or just schedule the cath.  Renee Pain

## 2015-11-28 NOTE — Telephone Encounter (Signed)
Pt called she is a pt of [facility] she was told to call to get cardiac cath scheduled .Marland Kitchen Please advise

## 2015-11-30 NOTE — Telephone Encounter (Signed)
Called pt to schedule her Right heart cath and no answer/no voicemail [phone])

## 2015-12-01 ENCOUNTER — Encounter (HOSPITAL_COMMUNITY): Payer: Self-pay | Admitting: *Deleted

## 2015-12-01 ENCOUNTER — Other Ambulatory Visit (HOSPITAL_COMMUNITY): Payer: Self-pay | Admitting: *Deleted

## 2015-12-01 DIAGNOSIS — R0600 Dyspnea, unspecified: Secondary | ICD-10-CM

## 2015-12-01 NOTE — Telephone Encounter (Signed)
Attempted to reach pt to schedule RHC again no answer/no vm [phone])

## 2015-12-01 NOTE — Telephone Encounter (Signed)
Spoke with patient and scheduled her for a RHC on 12/08/15 at [place]. Cath letter mailed

## 2015-12-08 ENCOUNTER — Encounter (HOSPITAL_COMMUNITY): Payer: Self-pay | Admitting: Cardiology

## 2015-12-08 ENCOUNTER — Encounter (HOSPITAL_COMMUNITY)
Admission: RE | Disposition: A | Discharge status: Home / Self Care | Payer: Self-pay | Source: Ambulatory Visit | Attending: Cardiology

## 2015-12-08 ENCOUNTER — Ambulatory Visit (HOSPITAL_COMMUNITY)
Admission: RE | Admit: 2015-12-08 | Discharge: 2015-12-08 | Disposition: A | Discharge status: Home / Self Care | Payer: Medicare Other | Source: Ambulatory Visit | Attending: Cardiology | Admitting: Cardiology

## 2015-12-08 ENCOUNTER — Encounter: Payer: Medicare Other | Admitting: Family Medicine

## 2015-12-08 ENCOUNTER — Other Ambulatory Visit: Payer: Self-pay

## 2015-12-08 DIAGNOSIS — I272 Other secondary pulmonary hypertension: Secondary | ICD-10-CM | POA: Insufficient documentation

## 2015-12-08 DIAGNOSIS — R0600 Dyspnea, unspecified: Secondary | ICD-10-CM

## 2015-12-08 DIAGNOSIS — J9611 Chronic respiratory failure with hypoxia: Secondary | ICD-10-CM | POA: Diagnosis not present

## 2015-12-08 DIAGNOSIS — Z955 Presence of coronary angioplasty implant and graft: Secondary | ICD-10-CM | POA: Diagnosis not present

## 2015-12-08 DIAGNOSIS — I270 Primary pulmonary hypertension: Secondary | ICD-10-CM | POA: Diagnosis not present

## 2015-12-08 HISTORY — PX: CARDIAC CATHETERIZATION: SHX172

## 2015-12-08 LAB — POCT I-STAT 3, VENOUS BLOOD GAS (G3P V)
ACID-BASE EXCESS: 2 mmol/L (ref 0.0–2.0)
ACID-BASE EXCESS: 5 mmol/L — AB (ref 0.0–2.0)
Bicarbonate: 28.7 mEq/L — ABNORMAL HIGH (ref 20.0–24.0)
Bicarbonate: 30.6 mEq/L — ABNORMAL HIGH (ref 20.0–24.0)
O2 SAT: 57 %
O2 Saturation: 51 %
PH VEN: 7.351 — AB (ref 7.250–7.300)
PH VEN: 7.389 — AB (ref 7.250–7.300)
TCO2: 30 mmol/L (ref 0–100)
TCO2: 32 mmol/L (ref 0–100)
pCO2, Ven: 50.6 mmHg — ABNORMAL HIGH (ref 45.0–50.0)
pCO2, Ven: 51.8 mmHg — ABNORMAL HIGH (ref 45.0–50.0)
pO2, Ven: 29 mmHg — ABNORMAL LOW (ref 31.0–45.0)
pO2, Ven: 31 mmHg (ref 31.0–45.0)

## 2015-12-08 LAB — CBC
HCT: 37.7 % (ref 36.0–46.0)
Hemoglobin: 11.7 g/dL — ABNORMAL LOW (ref 12.0–15.0)
MCH: 30.8 pg (ref 26.0–34.0)
MCHC: 31 g/dL (ref 30.0–36.0)
MCV: 99.2 fL (ref 78.0–100.0)
PLATELETS: 202 10*3/uL (ref 150–400)
RBC: 3.8 MIL/uL — ABNORMAL LOW (ref 3.87–5.11)
RDW: 12.9 % (ref 11.5–15.5)
WBC: 9.4 10*3/uL (ref 4.0–10.5)

## 2015-12-08 LAB — BASIC METABOLIC PANEL
Anion gap: 10 (ref 5–15)
BUN: 20 mg/dL (ref 6–20)
CALCIUM: 9.7 mg/dL (ref 8.9–10.3)
CHLORIDE: 98 mmol/L — AB (ref 101–111)
CO2: 30 mmol/L (ref 22–32)
CREATININE: 0.78 mg/dL (ref 0.44–1.00)
GFR calc Af Amer: >60 mL/min (ref >60)
Glucose, Bld: 91 mg/dL (ref 65–99)
Potassium: 4.3 mmol/L (ref 3.5–5.1)
SODIUM: 138 mmol/L (ref 135–145)

## 2015-12-08 LAB — PROTIME-INR
INR: 0.98 (ref 0.00–1.49)
PROTHROMBIN TIME: 13.2 s (ref 11.6–15.2)

## 2015-12-08 SURGERY — RIGHT HEART CATH

## 2015-12-08 MED ORDER — MIDAZOLAM HCL 2 MG/2ML IJ SOLN
INTRAMUSCULAR | Status: AC
  Filled 2015-12-08: qty 2

## 2015-12-08 MED ORDER — SODIUM CHLORIDE 0.9% FLUSH: 3.0000 mL | Freq: Two times a day (BID) | INTRAVENOUS | Status: DC

## 2015-12-08 MED ORDER — SODIUM CHLORIDE 0.9 % IV SOLN: 250.0000 mL | INTRAVENOUS | Status: DC | PRN

## 2015-12-08 MED ORDER — SODIUM CHLORIDE 0.9% FLUSH: 3.0000 mL | INTRAVENOUS | Status: DC | PRN

## 2015-12-08 MED ORDER — LIDOCAINE HCL (PF) 1 % IJ SOLN
INTRAMUSCULAR | Status: AC
  Filled 2015-12-08: qty 30

## 2015-12-08 MED ORDER — FENTANYL CITRATE (PF) 100 MCG/2ML IJ SOLN
INTRAMUSCULAR | Status: AC
  Filled 2015-12-08: qty 2

## 2015-12-08 MED ORDER — ONDANSETRON HCL 4 MG/2ML IJ SOLN: 4.0000 mg | Freq: Four times a day (QID) | INTRAMUSCULAR | Status: DC | PRN

## 2015-12-08 MED ORDER — HEPARIN (PORCINE) IN NACL 2-0.9 UNIT/ML-% IJ SOLN
INTRAMUSCULAR | Status: DC | PRN
  Administered 2015-12-08: 500 mL

## 2015-12-08 MED ORDER — SODIUM CHLORIDE 0.9% FLUSH
3.0000 mL | INTRAVENOUS | Status: DC | PRN
Start: 2015-12-08 — End: 2015-12-08

## 2015-12-08 MED ORDER — ASPIRIN 81 MG PO CHEW: 81.0000 mg | CHEWABLE_TABLET | ORAL | Status: DC

## 2015-12-08 MED ORDER — ACETAMINOPHEN 325 MG PO TABS: 650.0000 mg | ORAL_TABLET | ORAL | Status: DC | PRN

## 2015-12-08 MED ORDER — FENTANYL CITRATE (PF) 100 MCG/2ML IJ SOLN
INTRAMUSCULAR | Status: DC | PRN
  Administered 2015-12-08: 25 ug via INTRAVENOUS

## 2015-12-08 MED ORDER — SODIUM CHLORIDE 0.9 % IV SOLN
INTRAVENOUS | Status: DC
  Administered 2015-12-08: 07:00:00 via INTRAVENOUS

## 2015-12-08 MED ORDER — LIDOCAINE HCL (PF) 1 % IJ SOLN
INTRAMUSCULAR | Status: DC | PRN
  Administered 2015-12-08: 2 mL
  Administered 2015-12-08: 20 mL

## 2015-12-08 MED ORDER — HEPARIN (PORCINE) IN NACL 2-0.9 UNIT/ML-% IJ SOLN
INTRAMUSCULAR | Status: AC
  Filled 2015-12-08: qty 500

## 2015-12-08 MED ORDER — MIDAZOLAM HCL 2 MG/2ML IJ SOLN
INTRAMUSCULAR | Status: DC | PRN
  Administered 2015-12-08: 0.5 mg via INTRAVENOUS

## 2015-12-08 SURGICAL SUPPLY — 10 items
CATH BALLN WEDGE 5F 110CM (CATHETERS) ×2 IMPLANT
CATH SWAN GANZ 7F STRAIGHT (CATHETERS) ×2 IMPLANT
GUIDEWIRE .025 260CM (WIRE) ×2 IMPLANT
PACK CARDIAC CATHETERIZATION (CUSTOM PROCEDURE TRAY) ×3 IMPLANT
SHEATH FAST CATH BRACH 5F 5CM (SHEATH) ×2 IMPLANT
SHEATH PINNACLE 7F 10CM (SHEATH) ×2 IMPLANT
TRANSDUCER W/STOPCOCK (MISCELLANEOUS) ×4 IMPLANT
TUBING ART PRESS 72  MALE/FEM (TUBING) ×2
TUBING ART PRESS 72 MALE/FEM (TUBING) IMPLANT
WIRE MICROINTRODUCER 60CM (WIRE) ×2 IMPLANT

## 2015-12-08 NOTE — Progress Notes (Addendum)
Sat pt up to DC, pt near syncope, pt was laid back, nausea started, no emesis, tried to sit up slowly, will continue to monitor. VSS, pt now feeling better.

## 2015-12-08 NOTE — H&P (View-Only) (Signed)
Subjective:     Patient ID: Cristina Parker, female   DOB: 26-Aug-1935, 80 y.o.   MRN: [id number]  HPI    OV 09/15/2015  Chief Complaint  Patient presents with  . Pulmonary Consult    MW pt here for second opinion for pulmonary fibrosis. Pt c/o DOE. Pt deneis cough, CP/tightness and wheezing. Pt wearing 2lpm pulsed.    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 She is a 35 pack smoking history in the past. In 2012 she had normal CT scan of the chest. Then for the last few years she's had insidious onset of dyspnea that has been progressive in the last 1 year. Since end of last year is been on 3 L oxygen with exertion 2 L at rest. She feels dyspnea is progressive. It is present on exertion relieved by rest. 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 She had limited autoimmune workup summer 2016 including ANA, rheumatoid factor and CCP antibody and these were normal. She was tried on a course of prednisone since September 2016 till early 2017 but this did not help. This I confirmed on chart review and talking to her.  She is with a friend Juliann Pulse wife was also retired Marine scientist from the hospital.  [employer]rewing technologist ILD questionnaire - Symptoms: She will occasionally coughs. It is not bothersome. She does not cough at night and the cough is dry. She has been dyspneic at a level IV-V which means that she is too breathless to leave the house and she is dyspneic when she dresses. She's been dyspneic for 1-2 years at this level - Past medical: She has heart disease hepatitis history of pleurisy and pneumonia acid reflux dysphagia sensitivity to light. 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 Denies any exposure to occupational lung disease -Personal exposure history: Denies taking  any street drugs but did smoke cigarettes from age 32 1 pack a day and quit when she was 80 years old. -Family history of lung disease: Denies -Environmental history: She lives in an old house. There is no humidifier exposure. No Jacuzzi. No birds. -   OV 11/10/2015  Chief Complaint  Patient presents with  . Follow-up    Review PFT. Using 2-3 liters O2 and at night. Denies any current breathing issues out of her normal range.     Follow dyspnea with the suspicion of interstitial lung disease in the setting of chronic hypoxemic respiratory failure.  She continues to use oxygen. She is here to review results. They're all listed below  Pulmonary function test today shows restriction with low diffusion although her lung capacity is normal. The severity of reduced diffusion is extreme while the severity of restriction as moderate on the spirometry but normal in the lung volumes. CT scan of the chest surprisingly does not show any interstitial lung disease but Echo shows basilar dysfunction grade 2 with significant pulmonary artery systolic pressure elevation at 60 mm. The medical referral to Dr. Aundra Dubin for right heart catheterization with this is pending. Autoimmune blood tests March 2017 is normal.  IMPRESSION: HRCT 4/617 1. Pulmonary parenchymal scarring in the right hemi thorax. No definitive evidence of interstitial lung disease. 2. Right adrenal adenoma.   Electronically Signed  By: Lorin Picket M.D.  On: 09/26/2015 08:27  Echo 11/03/15 shows Diast dysfhn grade 2 with significant PASP elevaltion 60s -         has a past medical history of Heart disease; Crohn's disease ([place]); Duodenal stricture; Esophageal reflux; Hypertension; Hyperlipemia; Hiatal hernia; Pneumonia; Anemia; Depression; Collagenous colitis; Anxiety; Paroxysmal a-fib ([place]) (07/09/2011); Peripheral vascular disease ([place]); Blood transfusion without reported diagnosis; Duodenal ulcer; Arthritis; Cataract; Heart  murmur; and Osteoporosis.   reports that she quit smoking about 19 years ago. Her smoking use included Cigarettes. She has a 35 pack-year smoking history. She has never used smokeless tobacco.  Past Surgical History  Procedure Laterality Date  . Coronary artery bypass graft    . Hip surgery      right  . Abdominal hysterectomy    . Cervical disc surgery    . Bunionectomy  1977    both feet   . Fracture surgery  1998    ORIF  . Eye surgery  10/2010    left eye cataract  . Carotid endarterectomy  05-1998    Left CEA  . Carotid endarterectomy  04/11/11    Right CEA  . Aorto- innominate bpg  2005    for innominate artery occlusive disease  . Colonoscopy    . Cesarean section    . Tubal ligation      No Known Allergies  Immunization History  Administered Date(s) Administered  . Hepatitis B 06/18/2001  . Influenza Split 03/19/2011, 02/28/2012  . Influenza,inj,Quad PF,36+ Mos 03/13/2013, 04/09/2014, 03/09/2015  . Pneumococcal Conjugate-13 05/21/2014  . Pneumococcal Polysaccharide-23 06/22/2004  . Td 02/02/2010  . Zoster 06/18/1998    Family History  Problem Relation Age of Onset  . Colon polyps Father   . Heart disease Father     Before age 44  . Stroke Father   . Deep vein thrombosis Father   . Colon cancer Neg Hx   . Esophageal cancer Neg Hx   . Rectal cancer Neg Hx   . Stomach cancer Neg Hx   . Vascular Disease Brother   . Alcoholism Paternal Grandfather   . Heart disease Paternal Grandmother   . Hypertension Mother   . Varicose Veins Mother   . Peripheral vascular disease Mother   . Heart disease Maternal Grandmother   . Osteoporosis Mother      Current outpatient prescriptions:  .  Albuterol Sulfate (PROAIR RESPICLICK) 939 (90 BASE) MCG/ACT AEPB, Inhale 2 puffs into the lungs every 4 (four) hours as needed (SHORTNESS OF BREATH). Reported on 08/24/2015, Disp: , Rfl:  .  Ascorbic Acid (VITAMIN C) 1000 MG tablet, Take 1,000 mg by mouth daily., Disp: , Rfl:  .   aspirin 81 MG tablet, Take 81 mg by mouth every morning. , Disp: , Rfl:  .  atorvastatin (LIPITOR) 40 MG tablet, Take 1 tablet (40 mg total) by mouth daily., Disp: 90 tablet, Rfl: 3 .  cetirizine (ZYRTEC) 10 MG tablet, Take 1 tablet (10 mg total) by mouth at bedtime., Disp: 30 tablet, Rfl: 11 .  clopidogrel (PLAVIX) 75 MG tablet, Take 1 tablet (75 mg total) by mouth at bedtime., Disp: 90 tablet, Rfl: 3 .  escitalopram (LEXAPRO) 20 MG tablet, Take 1 tablet (20 mg total) by mouth daily., Disp: 90 tablet, Rfl: 3 .  HYDROcodone-acetaminophen (NORCO) 10-325 MG tablet, Take 0.5 tablets by mouth every 12 (twelve) hours as needed., Disp: 30 tablet, Rfl: 0 .  LORazepam (ATIVAN) 0.5 MG tablet, TAKE 1/2 TABLET BY MOUTH TWICE DAILY AS NEEDED FOR ANXIETY, Disp: 30 tablet,  Rfl: 3 .  LUMIGAN 0.01 % SOLN, Apply 1 drop in both eyes every night, Disp: , Rfl:  .  metoprolol succinate (TOPROL-XL) 50 MG 24 hr tablet, Take 1 tablet (50 mg total) by mouth daily. with or immediately following a meal., Disp: 90 tablet, Rfl: 3 .  OXYGEN, Oxygen 2lpm with rest and 3 lpm with exertion, Disp: , Rfl:  .  pantoprazole (PROTONIX) 40 MG tablet, Take 40 mg by mouth daily., Disp: , Rfl:  .  penicillin v potassium (VEETID) 500 MG tablet, Take 1 tablet (500 mg total) by mouth 4 (four) times daily., Disp: 90 tablet, Rfl: 0    Review of Systems     Objective:   Physical Exam Filed Vitals:   11/10/15 1342  BP: 104/70  Pulse: 77   Loud P2 present. Clear to auscultation bilaterally. Is mainly a discussion visit. She is oriented 3 and alert      Assessment:       ICD-9-CM ICD-10-CM   1. Pulmonary hyperinflation 786.9 R09.89   2. Dyspnea 786.09 R06.00   3. Chronic respiratory failure with hypoxia (HCC) 518.83 J96.11    799.02          Plan:     She has pulmonary hypertension that can explain  rer extreme low diffusion but a spirometry in lung volumes suggest that she has interstitial lung disease with emphysema but  the CT scan doesn't reveal either disease which is surprising. So for now we will give her empiric Spiriva. We will make sure she has an appointment with Dr. Aundra Dubin for right heart catheterization. She is agreeable with the plan   (> 50% of this 15 min visit spent in face to face counseling or/and coordination of care)   Dr. Brand Males, M.D., [facility].C.P Pulmonary and Critical Care Medicine Staff Physician [place] [facility] Pager: [phone], If no answer or between  15:00h - 7:00h: call [phone]  11/10/2015 2:03 PM

## 2015-12-08 NOTE — Progress Notes (Signed)
Denies dizziness, pt states it runs low. Pt eating now

## 2015-12-08 NOTE — Interval H&P Note (Signed)
History and Physical Interval Note:  12/08/2015 9:48 AM  Cristina Parker  has presented today for surgery, with the diagnosis of hp  The various methods of treatment have been discussed with the patient and family. After consideration of risks, benefits and other options for treatment, the patient has consented to  Procedure(s): Right Heart Cath (N/A) as a surgical intervention .  The patient's history has been reviewed, patient examined, no change in status, stable for surgery.  I have reviewed the patient's chart and labs.  Questions were answered to the patient's satisfaction.     [name] [employer]

## 2015-12-08 NOTE — Discharge Instructions (Signed)
Venogram, Care After Refer to this sheet in the next few weeks. These instructions provide you with information on caring for yourself after your procedure. Your health care provider may also give you more specific instructions. Your treatment has been planned according to current medical practices, but problems sometimes occur. Call your health care provider if you have any problems or questions after your procedure. WHAT TO EXPECT AFTER THE PROCEDURE After your procedure, it is typical to have the following sensations:  Mild discomfort at the catheter insertion site. HOME CARE INSTRUCTIONS   Take all medicines exactly as directed.  Follow any prescribed diet.  Follow instructions regarding both rest and physical activity.  Drink more fluids for the first several days after the procedure in order to help flush dye from your kidneys.  If you have bleeding at site, hold pressure 15 minutes. You may need to hold 5-10 minutes longer. Seek help if you are unable to stop the bleeding. SEEK MEDICAL CARE IF:  You develop a rash.  You have fever not controlled by medicine. SEEK IMMEDIATE MEDICAL CARE IF:  There is pain, drainage, bleeding, redness, swelling, warmth or a red streak at the site of the IV tube.  The extremity where your IV tube was placed becomes discolored, numb, or cool.  You have difficulty breathing or shortness of breath.  You develop chest pain.  You have excessive dizziness or fainting.   This information is not intended to replace advice given to you by your health care provider. Make sure you discuss any questions you have with your health care provider.   Document Released: 03/25/2013 Document Revised: 06/09/2013 Document Reviewed: 03/25/2013 Elsevier Interactive Patient Education [employer].

## 2015-12-08 NOTE — Progress Notes (Signed)
Readjusted bp cuff

## 2015-12-14 ENCOUNTER — Telehealth (HOSPITAL_COMMUNITY): Payer: Self-pay | Admitting: Pharmacist

## 2015-12-14 DIAGNOSIS — J449 Chronic obstructive pulmonary disease, unspecified: Secondary | ICD-10-CM | POA: Diagnosis not present

## 2015-12-14 NOTE — Telephone Encounter (Signed)
[employer] PA approved by [employer] D through 06/13/16.   Ruta Hinds. Velva Harman, PharmD, BCPS, CPP Clinical Pharmacist Pager: [phone] Phone: [phone] 12/14/2015 8:40 AM

## 2015-12-23 ENCOUNTER — Encounter: Payer: Self-pay | Admitting: Internal Medicine

## 2015-12-23 ENCOUNTER — Ambulatory Visit (INDEPENDENT_AMBULATORY_CARE_PROVIDER_SITE_OTHER): Payer: Medicare Other | Admitting: Internal Medicine

## 2015-12-23 VITALS — BP 128/64 | HR 76 | Ht 59.0 in | Wt 126.4 lb

## 2015-12-23 DIAGNOSIS — I272 Other secondary pulmonary hypertension: Secondary | ICD-10-CM | POA: Diagnosis not present

## 2015-12-23 DIAGNOSIS — R0902 Hypoxemia: Secondary | ICD-10-CM | POA: Insufficient documentation

## 2015-12-23 MED ORDER — FLUTICASONE FUROATE-VILANTEROL 100-25 MCG/INH IN AEPB: 1.0000 | INHALATION_SPRAY | Freq: Every day | RESPIRATORY_TRACT | Status: DC

## 2015-12-23 NOTE — Assessment & Plan Note (Signed)
Continue wearing oxygen at 2 L Goal saturations are 88-92%

## 2015-12-23 NOTE — Progress Notes (Signed)
History of Present Illness Cristina Parker is a 80 y.o. female with 35 pack year former ( quit 1998) smoking history with PAH diagnosed with R heart Cath 11/2015   12/23/2015 Follow Up [place]   Pt. Presents to the office today for follow up. She had a right heart cath 8 days ago.She had elevated PAP, and was started on Opsumit by Dr. Aundra Dubin. Her friend states that she has noted since starting this medication she has been less short of breath. Per the patient she states she is at baseline with her breathing. She wears her oxygen at 2L [place]. When entering the exam room she had saturations in the 60's. They rebounded easily with an increase in her oxygen to 2L.We spent some time discussing the need to maintain her oxygen saturations 88-92%. We also discussed using a saturation monitor to make sure her saturations are within range. She denies fever, chest pain, orthopnea, hemoptysis.She is continuing to use her Spiriva as prescribed. She does not have a rescue inhaler.  Tests and Procedures.  Right Heart Cath ( Diagnostic )  1. Borderline elevated PCWP and mildly elevated RA pressure.  2. Preserved cardiac output.  3. Moderate pulmonary arterial hypertension.   Patient has Gleason . No definite rheumatological disease has been identified. Would plan treatment with selective pulmonary vasodilators, start with macitentan (will go ahead and start the paperwork as we complete her PAH workup). I will see in office for full PAH workup.   Right Heart Pressures RHC Procedural Findings: Hemodynamics (mmHg) RA mean 10 RV 70/14 PA 66/19, mean 41 PCWP mean 16 Oxygen saturations:  PA 57% AO 95%  Cardiac Output (Fick) 3.3  Cardiac Index (Fick) 2.2 PVR 7.6 WU  Cardiac Output (thermo) 4.16 Cardiac Index (thermo) 2.77  PVR 6 WU       Past medical hx Past Medical History  Diagnosis Date  . Heart disease   . Crohn's disease ([place])   . Duodenal stricture   . Esophageal reflux   . Hypertension     . Hyperlipemia   . Hiatal hernia   . Pneumonia   . Anemia   . Depression   . Collagenous colitis   . Anxiety   . Paroxysmal a-fib ([place]) 07/09/2011  . Peripheral vascular disease ([place])   . Blood transfusion without reported diagnosis   . Duodenal ulcer   . Arthritis   . Cataract     BILATERAL  . Heart murmur   . Osteoporosis      Past surgical hx, Family hx, Social hx all reviewed.  Current Outpatient Prescriptions on File Prior to Visit  Medication Sig  . Ascorbic Acid (VITAMIN C) 1000 MG tablet Take 1,000 mg by mouth daily.  Marland Kitchen aspirin 81 MG tablet Take 81 mg by mouth every morning.   Marland Kitchen atorvastatin (LIPITOR) 40 MG tablet Take 1 tablet (40 mg total) by mouth daily.  . cetirizine (ZYRTEC) 10 MG tablet Take 1 tablet (10 mg total) by mouth at bedtime. (Patient taking differently: Take 10 mg by mouth at bedtime as needed (for runnny nose). )  . chlorhexidine (PERIDEX) 0.12 % solution 10 mLs by Mouth Rinse route daily as needed (to prevent mouth infection).   . clopidogrel (PLAVIX) 75 MG tablet Take 1 tablet (75 mg total) by mouth at bedtime.  Marland Kitchen escitalopram (LEXAPRO) 20 MG tablet Take 1 tablet (20 mg total) by mouth daily.  Marland Kitchen HYDROcodone-acetaminophen (NORCO) 10-325 MG tablet Take 0.5 tablets by mouth every 12 (twelve) hours  as needed. (Patient taking differently: Take 0.5 tablets by mouth every 12 (twelve) hours as needed for moderate pain. )  . LORazepam (ATIVAN) 0.5 MG tablet TAKE 1/2 TABLET BY MOUTH TWICE DAILY AS NEEDED FOR ANXIETY  . LUMIGAN 0.01 % SOLN Apply 1 drop in both eyes every night  . metoprolol succinate (TOPROL-XL) 50 MG 24 hr tablet Take 1 tablet (50 mg total) by mouth daily. with or immediately following a meal.  . OXYGEN Inhale 2-3 L into the lungs daily. Oxygen 2lpm with rest and 3 lpm with exertion  . pantoprazole (PROTONIX) 40 MG tablet Take 40 mg by mouth daily.  . penicillin v potassium (VEETID) 500 MG tablet Take 1 tablet (500 mg total) by mouth 4 (four)  times daily. (Patient taking differently: Take 500 mg by mouth daily. )  . Tiotropium Bromide Monohydrate (SPIRIVA RESPIMAT) 2.5 MCG/ACT AERS Inhale 2 puffs into the lungs daily.   No current facility-administered medications on file prior to visit.     No Known Allergies  Review Of Systems:  Constitutional:   No  weight loss, night sweats,  Fevers, chills, fatigue, or  lassitude.  HEENT:   No headaches,  Difficulty swallowing,  Tooth/dental problems, or  Sore throat,                No sneezing, itching, ear ache, nasal congestion, post nasal drip,   CV:  No chest pain,  Orthopnea, PND, swelling in lower extremities, anasarca, dizziness, palpitations, syncope.   GI  No heartburn, indigestion, abdominal pain, nausea, vomiting, diarrhea, change in bowel habits, loss of appetite, bloody stools.   Resp: + shortness of breath with exertion less at rest.  No excess mucus, no productive cough,  No non-productive cough,  No coughing up of blood.  No change in color of mucus.  No wheezing.  No chest wall deformity  Skin: no rash or lesions.  GU: no dysuria, change in color of urine, no urgency or frequency.  No flank pain, no hematuria   MS:  No joint pain or swelling.  No decreased range of motion.  No back pain.  Psych:  No change in mood or affect. No depression or anxiety.  No memory loss.   Vital Signs BP 128/64 mmHg  Pulse 76  Ht _0  (1.499 m)  Wt 126 lb 6.4 oz (57.335 kg)  BMI 25.52 kg/m2  SpO2 90%   Physical Exam:  General- No distress,  A&Ox3, frail female on oxygen ENT: No sinus tenderness, TM clear, pale nasal mucosa, no oral exudate,no post nasal drip, no LAN Cardiac: S1, S2, regular rate and rhythm, no murmur Chest: No wheeze/ rales/ dullness; no accessory muscle use, no nasal flaring, no sternal retractions Abd.: Soft Non-tender Ext: No clubbing cyanosis, edema Neuro:  normal strength Skin: No rashes, warm and dry, pale Psych: normal mood and  behavior   Assessment/Plan  Pulmonary hypertension (HCC) PAH per Right sided cath Dyspnea on exertion Plan:  Start Breo  1 puff daily  Rinse your mouth after use. Stop Spiriva. Continue taking your Opsumit  10 mg once daily as prescribed by cardiology. Please maintain your oxygen saturations between 88-92%. You can purchase an oxygen saturation monitor to help you maintain this goal. Follow up with Dr. Chase Caller in 3 months Please contact office for sooner follow up if symptoms do not improve or worsen or seek emergency care     Hypoxemia Continue wearing oxygen at 2 L Goal saturations are 88-92%  Magdalen Spatz, NP 12/23/2015  3:28 PM    STAFF NOTE: I, Dr Ann Lions have personally reviewed patient's available data, including medical history, events of note, physical examination and test results as part of my evaluation. I have discussed with resident/NP and other care providers such as pharmacist, RN and RRT.  In addition,  I personally evaluated patient and elicited key findings of   S: *subjectively better after starting opsumit. Still on o2  O: On o2 Looks well CTA biltalrly  RHC results - reviewed  PFT May 2017 -restriction on spiro, Normal TLC  But severe reduction in dlco CT - per radiology no ILD though I thought there is. Autoimmune negative   A: chronic hypoxeic resp failure - stable. spiriva not helping subjectively PAH -recent opstumit and subjectively bettter  P: start breo opsumit per Dr Aundra Dubin  .  Rest per NP/medical resident whose note is outlined above and that I agree with   Dr. Brand Males, M.D., [facility].C.P Pulmonary and Critical Care Medicine Staff Physician [place] [facility] Pager: [phone], If no answer or between  15:00h - 7:00h: call [phone]  12/23/2015 5:36 PM

## 2015-12-23 NOTE — Patient Instructions (Addendum)
It is nice to meet you today. Start Breo  1 puff daily  Rinse your mouth after use. Stop Spiriva. Continue taking your Opsumit  10 mg once daily as prescribed by cardiology. Please maintain your oxygen saturations between 88-92%. You can purchase an oxygen saturation monitor to help you maintain this goal. Follow up with Dr. Chase Caller in 3 months Please contact office for sooner follow up if symptoms do not improve or worsen or seek emergency care

## 2015-12-23 NOTE — Assessment & Plan Note (Signed)
PAH per Right sided cath Dyspnea on exertion Plan:  Start Breo  1 puff daily  Rinse your mouth after use. Stop Spiriva. Continue taking your Opsumit  10 mg once daily as prescribed by cardiology. Please maintain your oxygen saturations between 88-92%. You can purchase an oxygen saturation monitor to help you maintain this goal. Follow up with Dr. Chase Caller in 3 months Please contact office for sooner follow up if symptoms do not improve or worsen or seek emergency care

## 2015-12-23 NOTE — Progress Notes (Signed)
Patient ID: Cristina Parker, female   DOB: 10-19-1935, 80 y.o.   MRN: [id number] Patient seen in the office today and instructed on use of breo ellipta.  Patient expressed understanding and demonstrated technique.

## 2015-12-28 DIAGNOSIS — H401131 Primary open-angle glaucoma, bilateral, mild stage: Secondary | ICD-10-CM | POA: Diagnosis not present

## 2016-01-03 DIAGNOSIS — H401131 Primary open-angle glaucoma, bilateral, mild stage: Secondary | ICD-10-CM | POA: Diagnosis not present

## 2016-01-10 ENCOUNTER — Telehealth: Payer: Self-pay

## 2016-01-10 MED ORDER — HYDROCODONE-ACETAMINOPHEN 10-325 MG PO TABS: 0.5000 | ORAL_TABLET | Freq: Two times a day (BID) | ORAL | 0 refills | Status: DC | PRN

## 2016-01-10 NOTE — Telephone Encounter (Signed)
Needs a refill on her hydrocodone please call once ready to pick up   Best number [phone]

## 2016-01-10 NOTE — Telephone Encounter (Signed)
Refilled x 2 mos so pt can make it to her appt on 9/7. No further refills w/o OV.

## 2016-01-11 NOTE — Telephone Encounter (Signed)
Notified pt of RFs being ready and need to keep appt for more. Pt agreed.

## 2016-01-12 ENCOUNTER — Other Ambulatory Visit: Payer: Self-pay

## 2016-01-12 ENCOUNTER — Telehealth: Payer: Self-pay

## 2016-01-12 MED ORDER — METOPROLOL SUCCINATE ER 50 MG PO TB24: 50.0000 mg | ORAL_TABLET | Freq: Every day | ORAL | 0 refills | Status: DC

## 2016-01-12 MED ORDER — CLOPIDOGREL BISULFATE 75 MG PO TABS: 75.0000 mg | ORAL_TABLET | Freq: Every day | ORAL | 0 refills | Status: DC

## 2016-01-12 MED ORDER — HYDROCODONE-ACETAMINOPHEN 10-325 MG PO TABS
0.5000 | ORAL_TABLET | Freq: Two times a day (BID) | ORAL | 0 refills | Status: DC | PRN
Start: 2016-01-12 — End: 2016-02-23

## 2016-01-12 MED ORDER — ATORVASTATIN CALCIUM 40 MG PO TABS: 40.0000 mg | ORAL_TABLET | Freq: Every day | ORAL | 0 refills | Status: DC

## 2016-01-12 MED ORDER — ESCITALOPRAM OXALATE 20 MG PO TABS: 20.0000 mg | ORAL_TABLET | Freq: Every day | ORAL | 0 refills | Status: DC

## 2016-01-12 NOTE — Telephone Encounter (Signed)
PATIENT WOULD LIKE DR. SHAW TO KNOW THAT SHE NEEDS REFILLS ON HER MEDICATIONS. THEY HAVE EXPIRED, BUT SHE HAS AN APPOINTMENT TO SEE HER ON SEPT. 7 TH. SHE NEEDS LEXAPRO 20 MG, PLAVIX 75 MG, LIPITOR 40 MG AND TOPROL XL 50 MG. BEST PHONE [phone]  PHARMACY CHOICE IS [facility].  [place]

## 2016-01-12 NOTE — Telephone Encounter (Signed)
Rx sent.

## 2016-01-13 DIAGNOSIS — J449 Chronic obstructive pulmonary disease, unspecified: Secondary | ICD-10-CM | POA: Diagnosis not present

## 2016-01-27 ENCOUNTER — Telehealth: Payer: Self-pay

## 2016-01-27 MED ORDER — METOPROLOL SUCCINATE ER 50 MG PO TB24: 50.0000 mg | ORAL_TABLET | Freq: Every day | ORAL | 0 refills | Status: DC

## 2016-01-27 NOTE — Telephone Encounter (Signed)
Pt advised.

## 2016-01-27 NOTE — Telephone Encounter (Signed)
Pt would like a refill on her metoprolol succinate (TOPROL-XL) 50 MG 24 hr tablet [[id number]]  Order Details. She would like [place] to use [facility] on [address] and [address]. Please advise at [phone]

## 2016-01-27 NOTE — Telephone Encounter (Signed)
Rx sent.

## 2016-02-13 ENCOUNTER — Other Ambulatory Visit: Payer: Self-pay | Admitting: Family Medicine

## 2016-02-13 DIAGNOSIS — J449 Chronic obstructive pulmonary disease, unspecified: Secondary | ICD-10-CM | POA: Diagnosis not present

## 2016-02-14 NOTE — Telephone Encounter (Signed)
Faxed in Rx

## 2016-02-21 ENCOUNTER — Telehealth: Payer: Self-pay | Admitting: Internal Medicine

## 2016-02-21 DIAGNOSIS — I272 Pulmonary hypertension, unspecified: Secondary | ICD-10-CM

## 2016-02-21 DIAGNOSIS — R0902 Hypoxemia: Secondary | ICD-10-CM

## 2016-02-21 NOTE — Telephone Encounter (Signed)
Called and spoke with Claudean Severance (pts friend/caregiver) and she is wanting to see if MR could order a light weight wheelchair through [facility].  She stated that the pt has declined and has not been out of the house since her last OV.  MR please advise. thanks

## 2016-02-21 NOTE — Telephone Encounter (Signed)
That Is fine - go ahead  Dr. Brand Males, M.D., [facility].C.P Pulmonary and Critical Care Medicine Staff Physician [place] [facility] Pager: [phone], If no answer or between  15:00h - 7:00h: call [phone]  02/21/2016 5:09 PM

## 2016-02-21 NOTE — Telephone Encounter (Signed)
Wheelchair ordered.  Pt and friend aware.  Nothing further needed.

## 2016-02-22 NOTE — Progress Notes (Signed)
Subjective:    Cristina Parker is a 80 y.o. female who presents for Medicare Annual/Subsequent preventive examination.  Her last AWV was done by a former colleague Dr. Leward Quan on 11/04/58  Preventive Screening-Counseling & Management  Tobacco History  Smoking Status  . Former Smoker  . Packs/day: 1.00  . Years: 35.00  . Types: Cigarettes  . Quit date: 06/18/1996  Smokeless Tobacco  . Never Used    Recheck 90% at rest - has O2sat monitor at home. Problems Prior to Visit 1. Pulmonary HTN causing chronic hypoxeic resp failure - on 2L O2 by  through [facility], need to maintain O2 sat from 88-92% so was to get home O2 sat monitor. Started on Opsumit 39m qd with initial improvement in sxs. At last visit with pulm 2 mos prior, pt was started on macitentan and breo 1 puff qd, Spiriva was stopped 2.  Right adrenal adenoma discovered incidentally on chest CT 09/2015 3.  Arthritis/h/o spinal compression fractures - On chronic hydrocodone 10/325 take 1/2 tab bid prn.   4.  HTN - on toprol xl 50 5.  HPL - on lipitor 40 6.  Mood d/o with depression and anxiety - on lexapro 20 7.  Debility - worsening, now mainly houseband and uses wheelchair [facility] 8.  Insomnia - uses lorazepam 2-3x wkly to help sleep. 9.  Osteoporosis: diagnosed in 1998 and tried 1-2 yrs of fosamax at that time. Last yr, Dr. ELoanne Drillingrecommended she start on prolia or reclast in addition to 12072mof calcium and 400u vit D qd. At that time she wasn't sure she could afford $100 twice a year. 10.  Crohn's disease with mult GI conditions - stay on protonix 40 qd 11.  Paroxysmal a. Fib 12. PVD/CAD/CVD - "vasculopath" On plavix for  13.   Vit D def  Brought info from her house call NP - nml clock and worsed - did have 1+ proteinuria - last checked here 10/2014 was normal.  Current Problems (verified) Patient Active Problem List   Diagnosis Date Noted  . Pulmonary hypertension (HCStanley07/12/2015  . Hypoxemia 12/23/2015  .  Pulmonary hyperinflation 11/10/2015  . Dyspnea 11/10/2015  . ILD (interstitial lung disease) (HCGlenbeulah03/30/2017  . Vitamin D deficiency 06/03/2015  . Chronic respiratory failure with hypoxia (HCAmes[facility]09/28/2016  . Postinflammatory pulmonary fibrosis (HCAloha06/12/2014  . Chronic pain syndrome 11/04/2014  . Carotid stenosis 04/06/2014  . Aftercare following surgery of the circulatory system 04/06/2014  . Cataract 09/25/2012  . Arthritis 09/25/2012  . Depression 09/25/2012  . Osteoporosis 09/25/2012  . Duodenal stricture 09/16/2012  . Murmur 12/19/2011  . Occlusion and stenosis of carotid artery without mention of cerebral infarction 11/15/2011  . Paroxysmal a-fib (HCLyon01/21/2013  . COPD (chronic obstructive pulmonary disease) (HCNyssa01/15/2013  . OTHER ULCERATIVE COLITIS 08/09/2009  . PVD 10/31/2007  . HIATAL HERNIA 10/31/2007  . Other and unspecified hyperlipidemia 07/18/2007  . Essential hypertension 07/18/2007  . CORONARY HEART DISEASE 07/18/2007  . VENTRICULAR ARRHYTHMIA 07/18/2007  . LUNG NODULE 07/18/2007  . G E R D 07/18/2007  . CAROTID ENDARTERECTOMY, LEFT, HX OF 07/18/2007    Medications Prior to Visit Current Outpatient Prescriptions on File Prior to Visit  Medication Sig Dispense Refill  . Ascorbic Acid (VITAMIN C) 1000 MG tablet Take 1,000 mg by mouth daily.    . Marland Kitchenspirin 81 MG tablet Take 81 mg by mouth every morning.     . Marland Kitchentorvastatin (LIPITOR) 40 MG tablet Take 1 tablet (40 mg  total) by mouth daily. 90 tablet 0  . cetirizine (ZYRTEC) 10 MG tablet Take 1 tablet (10 mg total) by mouth at bedtime. (Patient taking differently: Take 10 mg by mouth at bedtime as needed (for runnny nose). ) 30 tablet 11  . chlorhexidine (PERIDEX) 0.12 % solution 10 mLs by Mouth Rinse route daily as needed (to prevent mouth infection).     . clopidogrel (PLAVIX) 75 MG tablet Take 1 tablet (75 mg total) by mouth at bedtime. 90 tablet 0  . escitalopram (LEXAPRO) 20 MG tablet Take 1 tablet (20 mg  total) by mouth daily. 90 tablet 0  . HYDROcodone-acetaminophen (NORCO) 10-325 MG tablet Take 0.5 tablets by mouth every 12 (twelve) hours as needed for moderate pain. 30 tablet 0  . LORazepam (ATIVAN) 0.5 MG tablet TAKE 1/2 TABLET BY MOUTH TWICE DAILY AS NEEDED FOR ANXIETY 30 tablet 0  . LUMIGAN 0.01 % SOLN Apply 1 drop in both eyes every night    . metoprolol succinate (TOPROL-XL) 50 MG 24 hr tablet Take 1 tablet (50 mg total) by mouth daily. with or immediately following a meal. 90 tablet 0  . OXYGEN Inhale 2-3 L into the lungs daily. Oxygen 2lpm with rest and 3 lpm with exertion    . pantoprazole (PROTONIX) 40 MG tablet Take 40 mg by mouth daily.    . penicillin v potassium (VEETID) 500 MG tablet Take 1 tablet (500 mg total) by mouth 4 (four) times daily. (Patient taking differently: Take 500 mg by mouth daily. ) 90 tablet 0  . Tiotropium Bromide Monohydrate (SPIRIVA RESPIMAT) 2.5 MCG/ACT AERS Inhale 2 puffs into the lungs daily. 2 Inhaler 0   No current facility-administered medications on file prior to visit.     Current Medications (verified) Current Outpatient Prescriptions  Medication Sig Dispense Refill  . Ascorbic Acid (VITAMIN C) 1000 MG tablet Take 1,000 mg by mouth daily.    Marland Kitchen aspirin 81 MG tablet Take 81 mg by mouth every morning.     Marland Kitchen atorvastatin (LIPITOR) 40 MG tablet Take 1 tablet (40 mg total) by mouth daily. 90 tablet 0  . cetirizine (ZYRTEC) 10 MG tablet Take 1 tablet (10 mg total) by mouth at bedtime. (Patient taking differently: Take 10 mg by mouth at bedtime as needed (for runnny nose). ) 30 tablet 11  . chlorhexidine (PERIDEX) 0.12 % solution 10 mLs by Mouth Rinse route daily as needed (to prevent mouth infection).     . clopidogrel (PLAVIX) 75 MG tablet Take 1 tablet (75 mg total) by mouth at bedtime. 90 tablet 0  . escitalopram (LEXAPRO) 20 MG tablet Take 1 tablet (20 mg total) by mouth daily. 90 tablet 0  . HYDROcodone-acetaminophen (NORCO) 10-325 MG tablet Take  0.5 tablets by mouth every 12 (twelve) hours as needed for moderate pain. 30 tablet 0  . LORazepam (ATIVAN) 0.5 MG tablet TAKE 1/2 TABLET BY MOUTH TWICE DAILY AS NEEDED FOR ANXIETY 30 tablet 0  . LUMIGAN 0.01 % SOLN Apply 1 drop in both eyes every night    . metoprolol succinate (TOPROL-XL) 50 MG 24 hr tablet Take 1 tablet (50 mg total) by mouth daily. with or immediately following a meal. 90 tablet 0  . OXYGEN Inhale 2-3 L into the lungs daily. Oxygen 2lpm with rest and 3 lpm with exertion    . pantoprazole (PROTONIX) 40 MG tablet Take 40 mg by mouth daily.    . penicillin v potassium (VEETID) 500 MG tablet Take 1 tablet (500  mg total) by mouth 4 (four) times daily. (Patient taking differently: Take 500 mg by mouth daily. ) 90 tablet 0  . Tiotropium Bromide Monohydrate (SPIRIVA RESPIMAT) 2.5 MCG/ACT AERS Inhale 2 puffs into the lungs daily. 2 Inhaler 0   No current facility-administered medications for this visit.      Allergies (verified) Review of patient's allergies indicates no known allergies.   PAST HISTORY  Family History Family History  Problem Relation Age of Onset  . Colon polyps Father   . Heart disease Father     Before age 29  . Stroke Father   . Deep vein thrombosis Father   . Colon cancer Neg Hx   . Esophageal cancer Neg Hx   . Rectal cancer Neg Hx   . Stomach cancer Neg Hx   . Vascular Disease Brother   . Alcoholism Paternal Grandfather   . Heart disease Paternal Grandmother   . Hypertension Mother   . Varicose Veins Mother   . Peripheral vascular disease Mother   . Heart disease Maternal Grandmother   . Osteoporosis Mother     Social History Social History  Substance Use Topics  . Smoking status: Former Smoker    Packs/day: 1.00    Years: 35.00    Types: Cigarettes    Quit date: 06/18/1996  . Smokeless tobacco: Never Used  . Alcohol use 4.2 - 8.4 oz/week    7 - 14 Standard drinks or equivalent per week     Comment: WINE     Are there smokers in  your home (other than you)? No - her son smokes outside   Risk Factors Current exercise habits: The patient does not participate in regular exercise at present.  Dietary issues discussed: occ decreased appetite - eats twice a year -  Tries to get protein in and plenty of veggies  Cardiac risk factors: advanced age (older than 54 for men, 19 for women), dyslipidemia, hypertension, microalbuminuria, sedentary lifestyle and smoking/ tobacco exposure.  Depression Screen Depression screen [facility] 2/9 02/23/2016 10/21/2015 08/24/2015 04/07/2015 03/09/2015  Decreased Interest 0 0 0 0 0  Down, Depressed, Hopeless 0 0 0 0 0  PHQ - 2 Score 0 0 0 0 0    (Note: if answer to either of the following is "Yes", a more complete depression screening is indicated)   Over the past two weeks, have you felt down, depressed or hopeless? No  Over the past two weeks, have you felt little interest or pleasure in doing things? No  Have you lost interest or pleasure in daily life? No  Do you often feel hopeless? No  Do you cry easily over simple problems? No  occ gets agitated during the day and the lorazepam makes her to sleepy to take it.  When she was taking a whole tab at bedtime she would have trouble getting up in the morning.  When she gets stressed and worried about running out of oxygen - always acts like she in a rush even when she wasn't  Activities of Daily Living In your present state of health, do you have any difficulty performing the following activities?:  Driving? Yes Managing money?  No Feeding yourself? No Getting from bed to chair? No  - just has to go slow Climbing a flight of stairs? Yes Preparing food and eating?: Yes - her son has fixed her some food in her room so she doesn't have to go to the kitchen Bathing or showering? Yes - has a shower  chair and goes slow Getting dressed: Yes - hard to leave oxy Getting to the toilet? No Using the toilet:No Moving around from place to place: Yes In the  past year have you fallen or had a near fall?:Yes - felt dizzy when she stood up sev wks ago - landed on right hip   Are you sexually active?  No  Do you have more than one partner?  No  Hearing Difficulties: Yes Do you often ask people to speak up or repeat themselves? Yes Do you experience ringing or noises in your ears? No Do you have difficulty understanding soft or whispered voices? Yes   Do you feel that you have a problem with memory? No  Do you often misplace items? Yes - has always done that  Do you feel safe at home?  Yes  Cognitive Testing  Alert? Yes  Normal Appearance?Yes  Oriented to person? Yes  Place? Yes   Time? Yes  Recall of three objects?  Yes  Can perform simple calculations? Yes  Displays appropriate judgment?Yes     Advanced Directives have been discussed with the patient? Yes  HCPOA is Raushanah Osmundson and is ok with CPR - wouldn't want be maintained on a vent or feeding tube permanently - esp if unconsiouc and not good prognosis  List the Names of Other Physician/Practitioners you currently use: 1.  Pulm - Dr. Chase Caller 2.  Cards - Dr. Johnsie Cancel 3.  Vascular - Dr. Donnetta Hutching 4.  Ortho - Dr. Alvan Dame 5.  GI - Dr. Sharlett Iles 6.  Optho -  7.  Dentist -  8.  Endocrine: Dr. Sharrie Rothman any recent Medical Services you may have received from other than [facility] providers in the past year (date may be approximate).  Immunization History  Administered Date(s) Administered  . Hepatitis B 06/18/2001  . Influenza Split 03/19/2011, 02/28/2012  . Influenza,inj,Quad PF,36+ Mos 03/13/2013, 04/09/2014, 03/09/2015  . Pneumococcal Conjugate-13 05/21/2014  . Pneumococcal Polysaccharide-23 06/22/2004  . Td 02/02/2010  . Zoster 06/18/1998    Screening Tests Health Maintenance  Topic Date Due  . INFLUENZA VACCINE  01/17/2016  . TETANUS/TDAP  02/03/2020  . DEXA SCAN  Completed  . ZOSTAVAX  Completed  . PNA vac Low Risk Adult  Completed    All answers were reviewed with  the patient and necessary referrals were made:  Marlie Kuennen, MD   02/22/2016   History reviewed: allergies, current medications, past family history, past medical history, past social history, past surgical history and problem list  Review of Systems Pertinent items noted in HPI and remainder of comprehensive ROS otherwise negative.    Objective:  BP 114/72   Pulse 78   Temp 97.2 F (36.2 C) (Oral)   Resp 18   SpO2 (!) 68%   Visual Acuity Screening   Right eye Left eye Both eyes  Without correction: 20/200 20/50 20/70  With correction:       BP 114/72   Pulse 78   Temp 97.2 F (36.2 C) (Oral)   Resp 18   SpO2 (!) 68%   General Appearance:    Alert, cooperative, no distress, appears stated age  Head:    Normocephalic, without obvious abnormality, atraumatic  Eyes:    PERRL, conjunctiva/corneas clear, EOM's intact, fundi    benign, both eyes  Ears:    Normal TM's and external ear canals, both ears  Nose:   Nares normal, septum midline, mucosa normal, no drainage    or sinus tenderness  Throat:   Lips, mucosa, and tongue normal; teeth and gums normal  Neck:   Supple, symmetrical, trachea midline, no adenopathy;    thyroid:  no enlargement/tenderness/nodules; no carotid   bruit or JVD  Back:     Symmetric, no curvature, ROM normal, no CVA tenderness  Lungs:     decrease breath sounds throughout with bibasilar rales  Chest Wall:    No tenderness or deformity   Heart:    Regular rate and rhythm, S1 and S2 normal, + murmur, no rub or gallop              Extremities:   Extremities normal, atraumatic, no cyanosis or edema  Pulses:   2+ and symmetric all extremities  Skin:   Skin color, texture, turgor normal, no rashes or lesions on trunk - unable to exam pelvis or lower ext  Lymph nodes:   Cervical, supraclavicular, and axillary nodes normal  Neurologic:   CNII-XII intact, in wheelchair    Assessment:     1. Medicare annual wellness visit, subsequent   2. Routine  screening for STI (sexually transmitted infection)   3. Screening for cardiovascular, respiratory, and genitourinary diseases   4. Screening for colorectal cancer   5. Screening for deficiency anemia   6. Screening for thyroid disorder   7. Encounter for screening mammogram for breast cancer   8. Essential hypertension   9. Pulmonary hypertension ([place])   10. Paroxysmal a-fib (HCC)   11. PVD   12. Atherosclerosis of native coronary artery of native heart without angina pectoris   13. Chronic respiratory failure with hypoxia (HCC) - completed DMV handicap forms  14. Gastroesophageal reflux disease with esophagitis   15. Osteoporosis   16. Arthritis   17. CKD (chronic kidney disease) stage 3, GFR 30-59 ml/min   18. Chronic pain syndrome   19. Depression   20. Hyperlipidemia LDL goal <70   21. Anemia, unspecified anemia type   22. Elevated blood sugar   23. Proteinuria         Plan:     During the course of the visit the patient was educated and counseled about appropriate screening and preventive services including:     Immunizations: TDaP 2011, zostavax 2000, pneumovax 2006, prevnar 2015  DEXA: done 04/21/2015 showed osteoporosis with T score -3.3 @ right radial forearm  Mammogram done at [place] 08/23/2011 normal  Cervical cancer screen: aged out  CRS: April 2012   EKG: 12/08/2015  Diet review for nutrition referral? Yes ____  Not Indicated ____  Flu shot  Anemia: cbc with hgb 12.0 6 mos prior Neg autoimmune w/u 6 mos prior Vit D nml 8 mos prior, vit B12 nml 3 yrs prior tsh nml 9 mos piror Lipid panel at goal 16 mos prior with LDL 39 and non-HDL of 57  On chronic pain medication - will need to sign a controlled drug contract - #30 hydrocodone 10-325 qmo.  Patient Instructions (the written plan) was given to the patient.  Medicare Attestation I have personally reviewed: The patient's medical and social history Their use of alcohol, tobacco or illicit  drugs Their current medications and supplements The patient's functional ability including ADLs,fall risks, home safety risks, cognitive, and hearing and visual impairment Diet and physical activities Evidence for depression or mood disorders  The patient's weight, height, BMI, and visual acuity have been recorded in the chart.  I have made referrals, counseling, and provided education to the patient based on review of the  above and I have provided the patient with a written personalized care plan for preventive services.     Delman Cheadle, MD   02/22/2016    Orders Placed This Encounter  Procedures  . Urine culture  . CBC  . Comprehensive metabolic panel    Order Specific Question:   Has the patient fasted?    Answer:   Yes  . Lipid panel    Order Specific Question:   Has the patient fasted?    Answer:   Yes  . Hemoglobin A1c  . POCT urinalysis dipstick  . POCT Microscopic Urinalysis ([facility])    Meds ordered this encounter  Medications  . HYDROcodone-acetaminophen (NORCO/VICODIN) 5-325 MG tablet    Sig: Take 1 tablet by mouth every 8 (eight) hours as needed for moderate pain.    Dispense:  60 tablet    Refill:  0  . HYDROcodone-acetaminophen (NORCO/VICODIN) 5-325 MG tablet    Sig: Take 1 tablet by mouth every 8 (eight) hours as needed for moderate pain.    Dispense:  60 tablet    Refill:  0    May fill 60d from date written  . HYDROcodone-acetaminophen (NORCO/VICODIN) 5-325 MG tablet    Sig: Take 1 tablet by mouth every 8 (eight) hours as needed for moderate pain.    Dispense:  60 tablet    Refill:  0    May fill 30d from date written  . clonazePAM (KLONOPIN) 0.5 MG tablet    Sig: Take 1 tablet (0.5 mg total) by mouth 2 (two) times daily as needed for anxiety.    Dispense:  20 tablet    Refill:  1     Delman Cheadle, M.D.  [facility] [address] [place], [place] [address] [phone] phone [phone] fax  02/29/16 10:46 AM  Results  for orders placed or performed in visit on 02/23/16  Urine culture  Result Value Ref Range   Culture ESCHERICHIA COLI    Colony Count >=100,000 COLONIES/ML    Organism ID, Bacteria ESCHERICHIA COLI       Susceptibility   Escherichia coli -  (no method available)    AMPICILLIN >=32 Resistant     AMOX/CLAVULANIC 4 Sensitive     AMPICILLIN/SULBACTAM 16 Intermediate     PIP/TAZO <=4 Sensitive     IMIPENEM <=0.25 Sensitive     CEFAZOLIN <=4 Not Reportable     CEFTRIAXONE <=1 Sensitive     CEFTAZIDIME <=1 Sensitive     CEFEPIME <=1 Sensitive     GENTAMICIN <=1 Sensitive     TOBRAMYCIN <=1 Sensitive     CIPROFLOXACIN >=4 Resistant     LEVOFLOXACIN >=8 Resistant     NITROFURANTOIN <=16 Sensitive     TRIMETH/SULFA* >=320 Resistant      * NR=NOT REPORTABLE,SEE COMMENTORAL therapy:A cefazolin MIC of <32 predicts susceptibility to the oral agents cefaclor,cefdinir,cefpodoxime,cefprozil,cefuroxime,cephalexin,and loracarbef when used for therapy of uncomplicated UTIs due to E.coli,K.pneumomiae,and P.mirabilis. PARENTERAL therapy: A cefazolinMIC of >8 indicates resistance to parenteralcefazolin. An alternate test method must beperformed to confirm susceptibility to parenteralcefazolin.  CBC  Result Value Ref Range   WBC 7.8 3.8 - 10.8 K/uL   RBC 2.97 (L) 3.80 - 5.10 MIL/uL   Hemoglobin 9.5 (L) 11.7 - 15.5 g/dL   HCT 29.2 (L) 35.0 - 45.0 %   MCV 98.3 80.0 - 100.0 fL   MCH 32.0 27.0 - 33.0 pg   MCHC 32.5 32.0 - 36.0 g/dL   RDW  13.7 11.0 - 15.0 %   Platelets 191 140 - 400 K/uL   MPV 8.4 7.5 - 12.5 fL  Comprehensive metabolic panel  Result Value Ref Range   Sodium 138 135 - 146 mmol/L   Potassium 3.9 3.5 - 5.3 mmol/L   Chloride 102 98 - 110 mmol/L   CO2 28 20 - 31 mmol/L   Glucose, Bld 89 65 - 99 mg/dL   BUN 12 7 - 25 mg/dL   Creat 0.66 0.60 - 0.93 mg/dL   Total Bilirubin 0.4 0.2 - 1.2 mg/dL   Alkaline Phosphatase 87 33 - 130 U/L   AST 17 10 - 35 U/L   ALT 13 6 - 29 U/L   Total Protein  6.0 (L) 6.1 - 8.1 g/dL   Albumin 3.7 3.6 - 5.1 g/dL   Calcium 8.9 8.6 - 10.4 mg/dL  Lipid panel  Result Value Ref Range   Cholesterol 115 (L) 125 - 200 mg/dL   Triglycerides 59 <150 mg/dL   HDL 73 >=46 mg/dL   Total CHOL/HDL Ratio 1.6 <=5.0 Ratio   VLDL 12 <30 mg/dL   LDL Cholesterol 30 <130 mg/dL  Hemoglobin A1c  Result Value Ref Range   Hgb A1c MFr Bld 4.7 <5.7 %   Mean Plasma Glucose 88 mg/dL  POCT urinalysis dipstick  Result Value Ref Range   Color, UA yellow yellow   Clarity, UA cloudy (A) clear   Glucose, UA negative negative   Bilirubin, UA negative negative   Ketones, POC UA negative negative   Spec Grav, UA 1.020    Blood, UA trace-intact (A) negative   pH, UA 6.5    Protein Ur, POC =30 (A) negative   Urobilinogen, UA 0.2    Nitrite, UA Positive (A) Negative   Leukocytes, UA small (1+) (A) Negative  POCT Microscopic Urinalysis ([facility])  Result Value Ref Range   WBC,UR,HPF,POC Too numerous to count  (A) None WBC/hpf   RBC,UR,HPF,POC None None RBC/hpf   Bacteria Many (A) None, Too numerous to count   Mucus Absent Absent   Epithelial Cells, UR Per Microscopy None None, Too numerous to count cells/hpf

## 2016-02-23 ENCOUNTER — Ambulatory Visit (INDEPENDENT_AMBULATORY_CARE_PROVIDER_SITE_OTHER): Payer: Medicare Other | Admitting: Family Medicine

## 2016-02-23 ENCOUNTER — Encounter: Payer: Self-pay | Admitting: Family Medicine

## 2016-02-23 ENCOUNTER — Other Ambulatory Visit: Payer: Self-pay | Admitting: Family Medicine

## 2016-02-23 VITALS — BP 114/72 | HR 78 | Temp 97.2°F | Resp 18

## 2016-02-23 DIAGNOSIS — R809 Proteinuria, unspecified: Secondary | ICD-10-CM | POA: Diagnosis not present

## 2016-02-23 DIAGNOSIS — Z113 Encounter for screening for infections with a predominantly sexual mode of transmission: Secondary | ICD-10-CM | POA: Diagnosis not present

## 2016-02-23 DIAGNOSIS — N183 Chronic kidney disease, stage 3 unspecified: Secondary | ICD-10-CM

## 2016-02-23 DIAGNOSIS — R7309 Other abnormal glucose: Secondary | ICD-10-CM

## 2016-02-23 DIAGNOSIS — R739 Hyperglycemia, unspecified: Secondary | ICD-10-CM

## 2016-02-23 DIAGNOSIS — Z1329 Encounter for screening for other suspected endocrine disorder: Secondary | ICD-10-CM | POA: Diagnosis not present

## 2016-02-23 DIAGNOSIS — D649 Anemia, unspecified: Secondary | ICD-10-CM

## 2016-02-23 DIAGNOSIS — M810 Age-related osteoporosis without current pathological fracture: Secondary | ICD-10-CM

## 2016-02-23 DIAGNOSIS — Z130 Encounter for screening for diseases of the blood and blood-forming organs and certain disorders involving the immune mechanism: Secondary | ICD-10-CM | POA: Diagnosis not present

## 2016-02-23 DIAGNOSIS — Z1389 Encounter for screening for other disorder: Secondary | ICD-10-CM | POA: Diagnosis not present

## 2016-02-23 DIAGNOSIS — Z136 Encounter for screening for cardiovascular disorders: Secondary | ICD-10-CM | POA: Diagnosis not present

## 2016-02-23 DIAGNOSIS — Z1211 Encounter for screening for malignant neoplasm of colon: Secondary | ICD-10-CM | POA: Diagnosis not present

## 2016-02-23 DIAGNOSIS — J9611 Chronic respiratory failure with hypoxia: Secondary | ICD-10-CM

## 2016-02-23 DIAGNOSIS — I272 Other secondary pulmonary hypertension: Secondary | ICD-10-CM

## 2016-02-23 DIAGNOSIS — E785 Hyperlipidemia, unspecified: Secondary | ICD-10-CM | POA: Diagnosis not present

## 2016-02-23 DIAGNOSIS — F32A Depression, unspecified: Secondary | ICD-10-CM

## 2016-02-23 DIAGNOSIS — F329 Major depressive disorder, single episode, unspecified: Secondary | ICD-10-CM

## 2016-02-23 DIAGNOSIS — Z1231 Encounter for screening mammogram for malignant neoplasm of breast: Secondary | ICD-10-CM | POA: Diagnosis not present

## 2016-02-23 DIAGNOSIS — I2510 Atherosclerotic heart disease of native coronary artery without angina pectoris: Secondary | ICD-10-CM

## 2016-02-23 DIAGNOSIS — Z0000 Encounter for general adult medical examination without abnormal findings: Secondary | ICD-10-CM | POA: Diagnosis not present

## 2016-02-23 DIAGNOSIS — I739 Peripheral vascular disease, unspecified: Secondary | ICD-10-CM

## 2016-02-23 DIAGNOSIS — Z1212 Encounter for screening for malignant neoplasm of rectum: Secondary | ICD-10-CM

## 2016-02-23 DIAGNOSIS — Z1383 Encounter for screening for respiratory disorder NEC: Secondary | ICD-10-CM | POA: Diagnosis not present

## 2016-02-23 DIAGNOSIS — I10 Essential (primary) hypertension: Secondary | ICD-10-CM | POA: Diagnosis not present

## 2016-02-23 DIAGNOSIS — I480 Paroxysmal atrial fibrillation: Secondary | ICD-10-CM | POA: Diagnosis not present

## 2016-02-23 DIAGNOSIS — K2100 Gastro-esophageal reflux disease with esophagitis, without bleeding: Secondary | ICD-10-CM

## 2016-02-23 DIAGNOSIS — N3000 Acute cystitis without hematuria: Secondary | ICD-10-CM

## 2016-02-23 DIAGNOSIS — G894 Chronic pain syndrome: Secondary | ICD-10-CM

## 2016-02-23 DIAGNOSIS — M1990 Unspecified osteoarthritis, unspecified site: Secondary | ICD-10-CM

## 2016-02-23 LAB — POC MICROSCOPIC URINALYSIS (UMFC): Mucus: ABSENT

## 2016-02-23 LAB — CBC
HEMATOCRIT: 29.2 % — AB (ref 35.0–45.0)
Hemoglobin: 9.5 g/dL — ABNORMAL LOW (ref 11.7–15.5)
MCH: 32 pg (ref 27.0–33.0)
MCHC: 32.5 g/dL (ref 32.0–36.0)
MCV: 98.3 fL (ref 80.0–100.0)
MPV: 8.4 fL (ref 7.5–12.5)
PLATELETS: 191 10*3/uL (ref 140–400)
RBC: 2.97 MIL/uL — ABNORMAL LOW (ref 3.80–5.10)
RDW: 13.7 % (ref 11.0–15.0)
WBC: 7.8 10*3/uL (ref 3.8–10.8)

## 2016-02-23 LAB — COMPREHENSIVE METABOLIC PANEL
ALK PHOS: 87 U/L (ref 33–130)
ALT: 13 U/L (ref 6–29)
AST: 17 U/L (ref 10–35)
Albumin: 3.7 g/dL (ref 3.6–5.1)
BILIRUBIN TOTAL: 0.4 mg/dL (ref 0.2–1.2)
BUN: 12 mg/dL (ref 7–25)
CO2: 28 mmol/L (ref 20–31)
CREATININE: 0.66 mg/dL (ref 0.60–0.93)
Calcium: 8.9 mg/dL (ref 8.6–10.4)
Chloride: 102 mmol/L (ref 98–110)
GLUCOSE: 89 mg/dL (ref 65–99)
Potassium: 3.9 mmol/L (ref 3.5–5.3)
SODIUM: 138 mmol/L (ref 135–146)
Total Protein: 6 g/dL — ABNORMAL LOW (ref 6.1–8.1)

## 2016-02-23 LAB — LIPID PANEL
Cholesterol: 115 mg/dL — ABNORMAL LOW (ref 125–200)
HDL: 73 mg/dL (ref >=46)
LDL CALC: 30 mg/dL (ref <130)
Total CHOL/HDL Ratio: 1.6 Ratio (ref <=5.0)
Triglycerides: 59 mg/dL (ref <150)
VLDL: 12 mg/dL (ref <30)

## 2016-02-23 LAB — POCT URINALYSIS DIP (MANUAL ENTRY)
Bilirubin, UA: NEGATIVE
GLUCOSE UA: NEGATIVE
Ketones, POC UA: NEGATIVE
NITRITE UA: POSITIVE — AB
PH UA: 6.5
SPEC GRAV UA: 1.02
UROBILINOGEN UA: 0.2

## 2016-02-23 MED ORDER — HYDROCODONE-ACETAMINOPHEN 5-325 MG PO TABS: 1.0000 | ORAL_TABLET | Freq: Three times a day (TID) | ORAL | 0 refills | Status: DC | PRN

## 2016-02-23 MED ORDER — CLONAZEPAM 0.5 MG PO TABS: 0.5000 mg | ORAL_TABLET | Freq: Two times a day (BID) | ORAL | 1 refills | Status: DC | PRN

## 2016-02-23 NOTE — Patient Instructions (Addendum)
IF you received an x-ray today, you will receive an invoice from [facility]. Please contact [facility] at [phone] with questions or concerns regarding your invoice.   IF you received labwork today, you will receive an invoice from [employer]. Please contact [facility] at [phone] with questions or concerns regarding your invoice.   Our billing staff will not be able to assist you with questions regarding bills from these companies.  You will be contacted with the lab results as soon as they are available. The fastest way to get your results is to activate your My Chart account. Instructions are located on the last page of this paperwork. If you have not heard from [place] regarding the results in 2 weeks, please contact this office.     Heart-Healthy Eating Plan Many factors influence your heart health, including eating and exercise habits. Heart (coronary) risk increases with abnormal blood fat (lipid) levels. Heart-healthy meal planning includes limiting unhealthy fats, increasing healthy fats, and making other small dietary changes. This includes maintaining a healthy body weight to help keep lipid levels within a normal range. WHAT IS MY PLAN?  Your health care provider recommends that you:  Get no more than _________% of the total calories in your daily diet from fat.  Limit your intake of saturated fat to less than _________% of your total calories each day.  Limit the amount of cholesterol in your diet to less than _________ mg per day. WHAT TYPES OF FAT SHOULD I CHOOSE?  Choose healthy fats more often. Choose monounsaturated and polyunsaturated fats, such as olive oil and canola oil, flaxseeds, walnuts, almonds, and seeds.  Eat more omega-3 fats. Good choices include salmon, mackerel, sardines, tuna, flaxseed oil, and ground flaxseeds. Aim to eat fish at least two times each week.  Limit saturated fats. Saturated fats are  primarily found in animal products, such as meats, butter, and cream. Plant sources of saturated fats include palm oil, palm kernel oil, and coconut oil.  Avoid foods with partially hydrogenated oils in them. These contain trans fats. Examples of foods that contain trans fats are stick margarine, some tub margarines, cookies, crackers, and other baked goods. WHAT GENERAL GUIDELINES DO I NEED TO FOLLOW?  Check food labels carefully to identify foods with trans fats or high amounts of saturated fat.  Fill one half of your plate with vegetables and green salads. Eat 4-5 servings of vegetables per day. A serving of vegetables equals 1 cup of raw leafy vegetables,  cup of raw or cooked cut-up vegetables, or  cup of vegetable juice.  Fill one fourth of your plate with whole grains. Look for the word "whole" as the first word in the ingredient list.  Fill one fourth of your plate with lean protein foods.  Eat 4-5 servings of fruit per day. A serving of fruit equals one medium whole fruit,  cup of dried fruit,  cup of fresh, frozen, or canned fruit, or  cup of 100% fruit juice.  Eat more foods that contain soluble fiber. Examples of foods that contain this type of fiber are apples, broccoli, carrots, beans, peas, and barley. Aim to get 20-30 g of fiber per day.  Eat more home-cooked food and less restaurant, buffet, and fast food.  Limit or avoid alcohol.  Limit foods that are high in starch and sugar.  Avoid fried foods.  Cook foods by using methods other than frying. Baking, boiling, grilling, and broiling are all great options.  Other fat-reducing suggestions include:  Removing the skin from poultry.  Removing all visible fats from meats.  Skimming the fat off of stews, soups, and gravies before serving them.  Steaming vegetables in water or broth.  Lose weight if you are overweight. Losing just 5-10% of your initial body weight can help your overall health and prevent diseases such  as diabetes and heart disease.  Increase your consumption of nuts, legumes, and seeds to 4-5 servings per week. One serving of dried beans or legumes equals  cup after being cooked, one serving of nuts equals 1 ounces, and one serving of seeds equals  ounce or 1 tablespoon.  You may need to monitor your salt (sodium) intake, especially if you have high blood pressure. Talk with your health care provider or dietitian to get more information about reducing sodium. WHAT FOODS CAN I EAT? Grains Breads, including [place], white, pita, wheat, raisin, rye, oatmeal, and [place]. Tortillas that are neither fried nor made with lard or trans fat. Low-fat rolls, including hotdog and hamburger buns and English muffins. Biscuits. Muffins. Waffles. Pancakes. Light popcorn. Whole-grain cereals. Flatbread. Melba toast. Pretzels. Breadsticks. Rusks. Low-fat snacks and crackers, including oyster, saltine, matzo, graham, animal, and rye. Rice and pasta, including brown rice and those that are made with whole wheat. Vegetables All vegetables. Fruits All fruits, but limit coconut. Meats and Other Protein Sources Lean, well-trimmed beef, veal, pork, and lamb. Chicken and [place] without skin. All fish and shellfish. Wild duck, rabbit, pheasant, and venison. Egg whites or low-cholesterol egg substitutes. Dried beans, peas, lentils, and tofu.Seeds and most nuts. Dairy Low-fat or nonfat cheeses, including ricotta, string, and mozzarella. Skim or 1% milk that is liquid, powdered, or evaporated. Buttermilk that is made with low-fat milk. Nonfat or low-fat yogurt. Beverages Mineral water. Diet carbonated beverages. Sweets and Desserts Sherbets and fruit ices. Honey, jam, marmalade, jelly, and syrups. Meringues and gelatins. Pure sugar candy, such as hard candy, jelly beans, gumdrops, mints, marshmallows, and small amounts of dark chocolate. [employer] Eat all sweets and desserts in moderation. Fats and  Oils Nonhydrogenated (trans-free) margarines. Vegetable oils, including soybean, sesame, sunflower, olive, peanut, safflower, corn, canola, and cottonseed. Salad dressings or mayonnaise that are made with a vegetable oil. Limit added fats and oils that you use for cooking, baking, salads, and as spreads. Other Cocoa powder. Coffee and tea. All seasonings and condiments. The items listed above may not be a complete list of recommended foods or beverages. Contact your dietitian for more options. WHAT FOODS ARE NOT RECOMMENDED? Grains Breads that are made with saturated or trans fats, oils, or whole milk. Croissants. Butter rolls. Cheese breads. Sweet rolls. Donuts. Buttered popcorn. Chow mein noodles. High-fat crackers, such as cheese or butter crackers. Meats and Other Protein Sources Fatty meats, such as hotdogs, short ribs, sausage, spareribs, bacon, ribeye roast or steak, and mutton. High-fat deli meats, such as salami and bologna. Caviar. Domestic duck and goose. Organ meats, such as kidney, liver, sweetbreads, brains, gizzard, chitterlings, and heart. Dairy Cream, sour cream, cream cheese, and creamed cottage cheese. Whole milk cheeses, including blue (bleu), Monte[place] Jack, [place], [place], American, [place], Swiss, [place], [place], and [place]. Whole or 2% milk that is liquid, evaporated, or condensed. Whole buttermilk. Cream sauce or high-fat cheese sauce. Yogurt that is made from whole milk. Beverages Regular sodas and drinks with added sugar. Sweets and Desserts Frosting. Pudding. Cookies. Cakes other than angel food cake. Candy that has milk chocolate or white chocolate, hydrogenated fat,  butter, coconut, or unknown ingredients. Buttered syrups. Full-fat ice cream or ice cream drinks. Fats and Oils Gravy that has suet, meat fat, or shortening. Cocoa butter, hydrogenated oils, palm oil, coconut oil, palm kernel oil. These can often be found in baked products, candy, fried foods, nondairy  creamers, and whipped toppings. Solid fats and shortenings, including bacon fat, salt pork, lard, and butter. Nondairy cream substitutes, such as coffee creamers and sour cream substitutes. Salad dressings that are made of unknown oils, cheese, or sour cream. The items listed above may not be a complete list of foods and beverages to avoid. Contact your dietitian for more information.   This information is not intended to replace advice given to you by your health care provider. Make sure you discuss any questions you have with your health care provider.   Document Released: 03/13/2008 Document Revised: 06/25/2014 Document Reviewed: 11/26/2013 Elsevier Interactive Patient Education [employer].

## 2016-02-24 ENCOUNTER — Telehealth: Payer: Self-pay

## 2016-02-24 LAB — HEMOGLOBIN A1C
HEMOGLOBIN A1C: 4.7 % (ref <5.7)
MEAN PLASMA GLUCOSE: 88 mg/dL

## 2016-02-24 NOTE — Telephone Encounter (Signed)
PATIENT STATES SHE WAS  IN THE OFFICE Thursday 02/23/2016 TO SEE DR. SHAW FOR HER ANNUAL PHYSICAL. SHE IS ALMOST SURE DR. SHAW FILLED OUT HER FORM TO GET A RENEWAL ON HER HANDICAP STICKER, HOWEVER, WHEN SHE GOT HOME SHE COULD NOT FIND THE FORM. SHE WANTS TO KNOW IF DR. SHAW STILL HAS IT? PLEASE CALL HER BACK TO LET HER KNOW. BEST PHONE [phone] (HOME)  [place]

## 2016-02-24 NOTE — Telephone Encounter (Signed)
I will place form in your box Dr. Brigitte Pulse.

## 2016-02-24 NOTE — Telephone Encounter (Signed)
I handed it to her son during the office visit - he should have it.

## 2016-02-25 LAB — URINE CULTURE: Colony Count: >=100000

## 2016-02-28 NOTE — Telephone Encounter (Signed)
Called pt and she stated that she did not check with her son. She will do so and is sure he must have it. Will CB if not.

## 2016-02-29 ENCOUNTER — Telehealth: Payer: Self-pay | Admitting: Emergency Medicine

## 2016-02-29 ENCOUNTER — Other Ambulatory Visit: Payer: Self-pay | Admitting: Family Medicine

## 2016-02-29 LAB — FERRITIN: FERRITIN: 53 ng/mL (ref 20–288)

## 2016-02-29 MED ORDER — ESCITALOPRAM OXALATE 20 MG PO TABS: 20.0000 mg | ORAL_TABLET | Freq: Every day | ORAL | 1 refills | Status: DC

## 2016-02-29 MED ORDER — CEPHALEXIN 500 MG PO CAPS: 500.0000 mg | ORAL_CAPSULE | Freq: Two times a day (BID) | ORAL | 0 refills | Status: DC

## 2016-02-29 MED ORDER — ATORVASTATIN CALCIUM 40 MG PO TABS: 40.0000 mg | ORAL_TABLET | Freq: Every day | ORAL | 3 refills | Status: AC

## 2016-02-29 MED ORDER — CLOPIDOGREL BISULFATE 75 MG PO TABS: 75.0000 mg | ORAL_TABLET | Freq: Every day | ORAL | 0 refills | Status: DC

## 2016-02-29 MED ORDER — METOPROLOL SUCCINATE ER 50 MG PO TB24: 50.0000 mg | ORAL_TABLET | Freq: Every day | ORAL | 3 refills | Status: AC

## 2016-02-29 NOTE — Addendum Note (Signed)
Addended by: Delman Cheadle on: 02/29/2016 11:14 AM   Modules accepted: Orders

## 2016-02-29 NOTE — Telephone Encounter (Signed)
-----  Message from Shawnee Knapp, MD sent at 02/29/2016 11:05 AM EDT ----- Please let pt know that she has a UTI - I have sent an antibiotic to her pharmacy - keflex twice a day for 10 days. No pre-diabetes.  Her kidneys, liver, and salts in the blood are normal though she does need to increase the protein she is drinking- consider adding in a protein supplement like carnation instant breakfast into mild, boost, or ensure for example. Cholesterol is great.   Pt is anemic and is getting much worse. This could be coming from blood loss from her GI tract from Crohn's disease but since this is worsening and will make it even harder for her body to get the oxygen she needs it is important that this improves.   Try to get iron in the diet and please have someone pick home 3 home hemoccult cards for pt to do.  Recheck in 2-3 weeks - sooner if worse.  To [facility] or ER immed if she is feeling lightheaded or dizzy with position change, or having worsening shortness of breath or any chest pain/tightness/pressure.

## 2016-03-14 ENCOUNTER — Encounter: Payer: Self-pay | Admitting: Family Medicine

## 2016-03-15 DIAGNOSIS — J449 Chronic obstructive pulmonary disease, unspecified: Secondary | ICD-10-CM | POA: Diagnosis not present

## 2016-03-23 ENCOUNTER — Other Ambulatory Visit (INDEPENDENT_AMBULATORY_CARE_PROVIDER_SITE_OTHER): Payer: Medicare Other | Admitting: Family Medicine

## 2016-03-23 DIAGNOSIS — Z1211 Encounter for screening for malignant neoplasm of colon: Secondary | ICD-10-CM

## 2016-03-23 LAB — POC HEMOCCULT BLD/STL (HOME/3-CARD/SCREEN)
Card #3 Fecal Occult Blood, POC: NEGATIVE
FECAL OCCULT BLD: NEGATIVE
Fecal Occult Blood, POC: NEGATIVE

## 2016-03-23 NOTE — Progress Notes (Signed)
Here for lab only visit

## 2016-03-27 ENCOUNTER — Encounter: Payer: Self-pay | Admitting: Internal Medicine

## 2016-03-27 ENCOUNTER — Ambulatory Visit (INDEPENDENT_AMBULATORY_CARE_PROVIDER_SITE_OTHER): Payer: Medicare Other | Admitting: Internal Medicine

## 2016-03-27 VITALS — BP 114/68 | HR 76 | Ht 59.0 in | Wt 120.0 lb

## 2016-03-27 DIAGNOSIS — J9611 Chronic respiratory failure with hypoxia: Secondary | ICD-10-CM | POA: Diagnosis not present

## 2016-03-27 DIAGNOSIS — J984 Other disorders of lung: Secondary | ICD-10-CM

## 2016-03-27 DIAGNOSIS — Z23 Encounter for immunization: Secondary | ICD-10-CM

## 2016-03-27 DIAGNOSIS — J432 Centrilobular emphysema: Secondary | ICD-10-CM

## 2016-03-27 DIAGNOSIS — I272 Pulmonary hypertension, unspecified: Secondary | ICD-10-CM | POA: Diagnosis not present

## 2016-03-27 DIAGNOSIS — R5381 Other malaise: Secondary | ICD-10-CM

## 2016-03-27 NOTE — Progress Notes (Signed)
Subjective:     Patient ID: Cristina Parker, female   DOB: 1935/08/04, 80 y.o.   MRN: [id number]  PCP Delman Cheadle, MD  HPI      OV 09/15/2015  Chief Complaint  Patient presents with  . Pulmonary Consult    MW pt here for second opinion for pulmonary fibrosis. Pt c/o DOE. Pt deneis cough, CP/tightness and wheezing. Pt wearing 2lpm pulsed.    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 She is a 35 pack smoking history in the past. In 2012 she had normal CT scan of the chest. Then for the last few years she's had insidious onset of dyspnea that has been progressive in the last 1 year. Since end of last year is been on 3 L oxygen with exertion 2 L at rest. She feels dyspnea is progressive. It is present on exertion relieved by rest. 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 She had limited autoimmune workup summer 2016 including ANA, rheumatoid factor and CCP antibody and these were normal. She was tried on a course of prednisone since September 2016 till early 2017 but this did not help. This I confirmed on chart review and talking to her.  She is with a friend Juliann Pulse wife was also retired Marine scientist from the hospital.  Brewing technologist ILD questionnaire - Symptoms: She will occasionally coughs. It is not bothersome. She does not cough at night and the cough is dry. She has been dyspneic at a level IV-V which means that she is too breathless to leave the house and she is dyspneic when she dresses. She's been dyspneic for 1-2 years at this level - Past medical: She has heart disease hepatitis history of pleurisy and pneumonia acid reflux dysphagia sensitivity to light. 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 Denies any exposure to occupational lung disease -Personal exposure  history: Denies taking any street drugs but did smoke cigarettes from age 68, 1 pack a day and quit when she was 80 years old.  reports that she quit smoking about 19 years ago. Her smoking use included Cigarettes. She has a 35.00 pack-year smoking history. She has never used smokeless tobacco.  -Family history of lung disease: Denies -Environmental history: She lives in an old house. There is no humidifier exposure. No Jacuzzi. No birds. -   OV 11/10/2015  Chief Complaint  Patient presents with  . Follow-up    Review PFT. Using 2-3 liters O2 and at night. Denies any current breathing issues out of her normal range.     Follow dyspnea with the suspicion of interstitial lung disease in the setting of chronic hypoxemic respiratory failure.  She continues to use oxygen. She is here to review results. They're all listed below  Pulmonary function test today shows restriction with low diffusion although her lung capacity is normal. The severity of reduced diffusion is extreme while the severity of restriction as moderate on the spirometry but normal in the lung volumes. CT scan of the chest surprisingly does not show any interstitial lung disease but Echo shows basilar dysfunction grade 2 with significant pulmonary artery systolic pressure elevation at 60 mm. The medical referral to Dr. Aundra Dubin for right heart catheterization with this is pending. Autoimmune blood tests March 2017 is normal.  IMPRESSION: HRCT  4/617 1. Pulmonary parenchymal scarring in the right hemi thorax. No definitive evidence of interstitial lung disease. 2. Right adrenal adenoma. 3. Mild emphysema centrilobular   Electronically Signed  By: Lorin Picket M.D.  On: 09/26/2015 08:27  Echo 11/03/15 shows Diast dysfhn grade 2 with significant PASP elevaltion 60s -       12/23/2015 Follow Up PAH   Pt. Presents to the office today for follow up. She had a right heart cath 8 days ago.She had elevated PAP, and was  started on Opsumit by Dr. Aundra Dubin. Her friend states that she has noted since starting this medication she has been less short of breath. Per the patient she states she is at baseline with her breathing. She wears her oxygen at 2L [place]. When entering the exam room she had saturations in the 60's. They rebounded easily with an increase in her oxygen to 2L.We spent some time discussing the need to maintain her oxygen saturations 88-92%. We also discussed using a saturation monitor to make sure her saturations are within range. She denies fever, chest pain, orthopnea, hemoptysis.She is continuing to use her Spiriva as prescribed. She does not have a rescue inhaler.  Tests and Procedures.  Right Heart Cath ( Diagnostic )  1. Borderline elevated PCWP and mildly elevated RA pressure.  2. Preserved cardiac output.  3. Moderate pulmonary arterial hypertension.   Patient has [place] . No definite rheumatological disease has been identified. Would plan treatment with selective pulmonary vasodilators, start with macitentan (will go ahead and start the paperwork as we complete her PAH workup). I will see in office for full PAH workup.   Right Heart Pressures [facility] Procedural Findings: Hemodynamics (mmHg) RA mean 10 RV 70/14 PA 66/19, mean 41 PCWP mean 16 Oxygen saturations:  PA 57% AO 95%  Cardiac Output (Fick) 3.3  Cardiac Index (Fick) 2.2 PVR 7.6 WU  Cardiac Output (thermo) 4.16 Cardiac Index (thermo) 2.77  PVR 6 WU       OV 03/27/2016  Chief Complaint  Patient presents with  . Pulmonary Hypertension    Breathing is worse since last OV. Reports SOB. Wheezing at times. Denies chest tightness or coughing.    Follow-up chronic hypoxemic respiratory failure  I started seeing her a few months ago for evaluation of chronic hypoxemic respiratory failure. Initial suspicion was initially interstitial lung disease. However after workup we have discovered that she has mild centrilobular emphysema  on CT associated with right hemithorax pulmonary scarring. She also has pulmonary hypertension. Recently a few months ago she's been started on advance [facility] therapy. She returns for follow-up. Despite inhalers and PAH therapy for the last few months she is not any better. However she is stable. She uses 3-4 L of oxygen at rest. Is no change in her health status. She is mostly deconditioned. She sits at home hardly walks. This was a few feet. Does not even do her ADLs. She is not interested in pulmonary rehabilitation but is interested in home physical therapy.     has a past medical history of Anemia; Anxiety; Arthritis; Blood transfusion without reported diagnosis; Cataract; Collagenous colitis; Crohn's disease ([place]); Depression; Duodenal stricture; Duodenal ulcer; Esophageal reflux; Heart disease; Heart murmur; Hiatal hernia; Hyperlipemia; Hypertension; Osteoporosis; Paroxysmal a-fib ([place]) (07/09/2011); Peripheral vascular disease ([address]); and Pneumonia.   reports that she quit smoking about 19 years ago. Her smoking use included Cigarettes. She has a 35.00 pack-year smoking history. She has never used smokeless tobacco.  Past Surgical History:  Procedure Laterality  Date  . ABDOMINAL HYSTERECTOMY    . 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 BUNIONECTOMY  1977   both feet   . CARDIAC CATHETERIZATION N/A 12/08/2015   Procedure: Right Heart Cath;  Surgeon: Larey Dresser, MD;  Location: [place] [facility];  Service: Cardiovascular;  Laterality: N/A;  . CAROTID ENDARTERECTOMY  05-1998   Left CEA  . CAROTID ENDARTERECTOMY  04/11/11   Right CEA  . CERVICAL DISC SURGERY    . CESAREAN SECTION    . COLONOSCOPY    . CORONARY ARTERY BYPASS GRAFT    . EYE SURGERY  10/2010   left eye cataract  . FRACTURE SURGERY  1998   ORIF  . HIP SURGERY     right  . TUBAL LIGATION      No Known Allergies  Immunization History  Administered Date(s) Administered  . Hepatitis B  06/18/2001  . Influenza Split 03/19/2011, 02/28/2012  . Influenza,inj,Quad PF,36+ Mos 03/13/2013, 04/09/2014, 03/09/2015  . Pneumococcal Conjugate-13 05/21/2014  . Pneumococcal Polysaccharide-23 06/22/2004  . Td 02/02/2010  . Zoster 06/18/1998    Family History  Problem Relation Age of Onset  . Colon polyps Father   . Heart disease Father     Before age 65  . Stroke Father   . Deep vein thrombosis Father   . Colon cancer Neg Hx   . Esophageal cancer Neg Hx   . Rectal cancer Neg Hx   . Stomach cancer Neg Hx   . Vascular Disease Brother   . Alcoholism Paternal Grandfather   . Heart disease Paternal Grandmother   . Hypertension Mother   . Varicose Veins Mother   . Peripheral vascular disease Mother   . Heart disease Maternal Grandmother   . Osteoporosis Mother      Current Outpatient Prescriptions:  .  Ascorbic Acid (VITAMIN C) 1000 MG tablet, Take 1,000 mg by mouth daily., Disp: , Rfl:  .  aspirin 81 MG tablet, Take 81 mg by mouth every morning. , Disp: , Rfl:  .  atorvastatin (LIPITOR) 40 MG tablet, Take 1 tablet (40 mg total) by mouth daily., Disp: 90 tablet, Rfl: 3 .  cetirizine (ZYRTEC) 10 MG tablet, Take 1 tablet (10 mg total) by mouth at bedtime., Disp: 30 tablet, Rfl: 11 .  clonazePAM (KLONOPIN) 0.5 MG tablet, Take 1 tablet (0.5 mg total) by mouth 2 (two) times daily as needed for anxiety., Disp: 20 tablet, Rfl: 1 .  clopidogrel (PLAVIX) 75 MG tablet, Take 1 tablet (75 mg total) by mouth at bedtime., Disp: 90 tablet, Rfl: 0 .  escitalopram (LEXAPRO) 20 MG tablet, Take 1 tablet (20 mg total) by mouth daily., Disp: 90 tablet, Rfl: 1 .  HYDROcodone-acetaminophen (NORCO/VICODIN) 5-325 MG tablet, Take 1 tablet by mouth every 8 (eight) hours as needed for moderate pain., Disp: 60 tablet, Rfl: 0 .  HYDROcodone-acetaminophen (NORCO/VICODIN) 5-325 MG tablet, Take 1 tablet by mouth every 8 (eight) hours as needed for moderate pain., Disp: 60 tablet, Rfl: 0 .   HYDROcodone-acetaminophen (NORCO/VICODIN) 5-325 MG tablet, Take 1 tablet by mouth every 8 (eight) hours as needed for moderate pain., Disp: 60 tablet, Rfl: 0 .  LORazepam (ATIVAN) 0.5 MG tablet, TAKE 1/2 TABLET BY MOUTH TWICE DAILY AS NEEDED FOR ANXIETY, Disp: 30 tablet, Rfl: 0 .  LUMIGAN 0.01 % SOLN, Apply 1 drop in both eyes every night, Disp: , Rfl:  .  metoprolol succinate (TOPROL-XL) 50 MG 24 hr tablet,  Take 1 tablet (50 mg total) by mouth daily. with or immediately following a meal., Disp: 90 tablet, Rfl: 3 .  OXYGEN, Inhale 2-3 L into the lungs daily. Oxygen 2lpm with rest and 3 lpm with exertion, Disp: , Rfl:  .  pantoprazole (PROTONIX) 40 MG tablet, Take 40 mg by mouth daily., Disp: , Rfl:  .  Tiotropium Bromide Monohydrate (SPIRIVA RESPIMAT) 2.5 MCG/ACT AERS, Inhale 2 puffs into the lungs daily., Disp: 2 Inhaler, Rfl: 0   Review of Systems     Objective:   Physical Exam  Vitals:   03/27/16 1026  BP: 114/68  Pulse: 76  SpO2: 91%  Weight: 120 lb (54.4 kg)  Height: 4' 11" (1.499 m)   Frail female sitting on a wheelchair oxygen on [place] crackles especially on the right side present No edema Normal heart sounds with possible heat to Soft abdomen Skin intact on exposed areas Alert and oriented 3 with normal speech     Assessment:       ICD-9-CM ICD-10-CM   1. Chronic respiratory failure with hypoxia (HCC) 518.83 J96.11    799.02    2. Pulmonary hypertension 416.8 I27.20   3. Centrilobular emphysema (HCC) 492.8 J43.2   4. Pulmonary scarring 518.89 J98.4   5. Physical deconditioning 799.3 R53.81        Plan:      You are 3-4L o2 dependent due to reasons #2, to #4 Thereofre you are physically deconditioned You are on max medical therapy  Plan  - refer Home PT   - continue 3-4 Lo2 - continue macitentan - might take another few months to see positive effect - high dose flu shot 03/27/2016  - continue inhalers as before  Followup 3 months or sooner if  needed  (> 50% of this 15 min visit spent in face to face counseling or/and coordination of care)   Dr. Brand Males, M.D., [facility].C.P Pulmonary and Critical Care Medicine Staff Physician [place] [facility] Pager: [phone], If no answer or between  15:00h - 7:00h: call [phone]  03/27/2016 10:57 AM

## 2016-03-27 NOTE — Patient Instructions (Addendum)
ICD-9-CM ICD-10-CM   1. Chronic respiratory failure with hypoxia (HCC) 518.83 J96.11    799.02    2. Pulmonary hypertension 416.8 I27.20   3. Centrilobular emphysema (HCC) 492.8 J43.2   4. Pulmonary scarring 518.89 J98.4   5. Physical deconditioning 799.3 R53.81    You are 3-4L o2 dependent due to reasons #2, to #4 Thereofre you are physically deconditioned You are on max medical therapy  Plan  - refer Home PT   - continue 3-4 Lo2 - continue macitentan - might take another few months to see positive effect - high dose flu shot 03/27/2016  - continue inhalers as before  Followup 3 months or sooner if needed

## 2016-03-28 ENCOUNTER — Telehealth: Payer: Self-pay

## 2016-03-28 DIAGNOSIS — Z79891 Long term (current) use of opiate analgesic: Secondary | ICD-10-CM | POA: Diagnosis not present

## 2016-03-28 DIAGNOSIS — I499 Cardiac arrhythmia, unspecified: Secondary | ICD-10-CM | POA: Diagnosis not present

## 2016-03-28 DIAGNOSIS — K5090 Crohn's disease, unspecified, without complications: Secondary | ICD-10-CM | POA: Diagnosis not present

## 2016-03-28 DIAGNOSIS — K219 Gastro-esophageal reflux disease without esophagitis: Secondary | ICD-10-CM | POA: Diagnosis not present

## 2016-03-28 DIAGNOSIS — I270 Primary pulmonary hypertension: Secondary | ICD-10-CM | POA: Diagnosis not present

## 2016-03-28 DIAGNOSIS — I10 Essential (primary) hypertension: Secondary | ICD-10-CM | POA: Diagnosis not present

## 2016-03-28 DIAGNOSIS — K449 Diaphragmatic hernia without obstruction or gangrene: Secondary | ICD-10-CM | POA: Diagnosis not present

## 2016-03-28 DIAGNOSIS — J849 Interstitial pulmonary disease, unspecified: Secondary | ICD-10-CM | POA: Diagnosis not present

## 2016-03-28 DIAGNOSIS — Z7901 Long term (current) use of anticoagulants: Secondary | ICD-10-CM | POA: Diagnosis not present

## 2016-03-28 DIAGNOSIS — Z9981 Dependence on supplemental oxygen: Secondary | ICD-10-CM | POA: Diagnosis not present

## 2016-03-28 DIAGNOSIS — J8410 Pulmonary fibrosis, unspecified: Secondary | ICD-10-CM | POA: Diagnosis not present

## 2016-03-28 DIAGNOSIS — M25552 Pain in left hip: Secondary | ICD-10-CM | POA: Diagnosis not present

## 2016-03-28 DIAGNOSIS — J449 Chronic obstructive pulmonary disease, unspecified: Secondary | ICD-10-CM | POA: Diagnosis not present

## 2016-03-28 DIAGNOSIS — I739 Peripheral vascular disease, unspecified: Secondary | ICD-10-CM | POA: Diagnosis not present

## 2016-03-28 DIAGNOSIS — E785 Hyperlipidemia, unspecified: Secondary | ICD-10-CM | POA: Diagnosis not present

## 2016-03-28 DIAGNOSIS — I480 Paroxysmal atrial fibrillation: Secondary | ICD-10-CM | POA: Diagnosis not present

## 2016-03-28 DIAGNOSIS — M545 Low back pain: Secondary | ICD-10-CM | POA: Diagnosis not present

## 2016-03-28 DIAGNOSIS — G8929 Other chronic pain: Secondary | ICD-10-CM | POA: Diagnosis not present

## 2016-03-28 DIAGNOSIS — M810 Age-related osteoporosis without current pathological fracture: Secondary | ICD-10-CM | POA: Diagnosis not present

## 2016-03-28 DIAGNOSIS — I2510 Atherosclerotic heart disease of native coronary artery without angina pectoris: Secondary | ICD-10-CM | POA: Diagnosis not present

## 2016-03-28 NOTE — Telephone Encounter (Signed)
Completed Cert of Med Nec from [place] for pt's O2. Will place in Dr Raul Del box for signature.

## 2016-03-29 DIAGNOSIS — M25552 Pain in left hip: Secondary | ICD-10-CM | POA: Diagnosis not present

## 2016-03-29 DIAGNOSIS — J849 Interstitial pulmonary disease, unspecified: Secondary | ICD-10-CM | POA: Diagnosis not present

## 2016-03-29 DIAGNOSIS — J8410 Pulmonary fibrosis, unspecified: Secondary | ICD-10-CM | POA: Diagnosis not present

## 2016-03-29 DIAGNOSIS — Z9981 Dependence on supplemental oxygen: Secondary | ICD-10-CM | POA: Diagnosis not present

## 2016-03-29 DIAGNOSIS — I2510 Atherosclerotic heart disease of native coronary artery without angina pectoris: Secondary | ICD-10-CM | POA: Diagnosis not present

## 2016-03-29 DIAGNOSIS — Z79891 Long term (current) use of opiate analgesic: Secondary | ICD-10-CM | POA: Diagnosis not present

## 2016-03-29 DIAGNOSIS — E785 Hyperlipidemia, unspecified: Secondary | ICD-10-CM | POA: Diagnosis not present

## 2016-03-29 DIAGNOSIS — K5090 Crohn's disease, unspecified, without complications: Secondary | ICD-10-CM | POA: Diagnosis not present

## 2016-03-29 DIAGNOSIS — K219 Gastro-esophageal reflux disease without esophagitis: Secondary | ICD-10-CM | POA: Diagnosis not present

## 2016-03-29 DIAGNOSIS — I499 Cardiac arrhythmia, unspecified: Secondary | ICD-10-CM | POA: Diagnosis not present

## 2016-03-29 DIAGNOSIS — I10 Essential (primary) hypertension: Secondary | ICD-10-CM | POA: Diagnosis not present

## 2016-03-29 DIAGNOSIS — M545 Low back pain: Secondary | ICD-10-CM | POA: Diagnosis not present

## 2016-03-29 DIAGNOSIS — G8929 Other chronic pain: Secondary | ICD-10-CM | POA: Diagnosis not present

## 2016-03-29 DIAGNOSIS — K449 Diaphragmatic hernia without obstruction or gangrene: Secondary | ICD-10-CM | POA: Diagnosis not present

## 2016-03-29 DIAGNOSIS — I480 Paroxysmal atrial fibrillation: Secondary | ICD-10-CM | POA: Diagnosis not present

## 2016-03-29 DIAGNOSIS — I270 Primary pulmonary hypertension: Secondary | ICD-10-CM | POA: Diagnosis not present

## 2016-03-29 DIAGNOSIS — J449 Chronic obstructive pulmonary disease, unspecified: Secondary | ICD-10-CM | POA: Diagnosis not present

## 2016-03-29 DIAGNOSIS — M810 Age-related osteoporosis without current pathological fracture: Secondary | ICD-10-CM | POA: Diagnosis not present

## 2016-03-29 DIAGNOSIS — I739 Peripheral vascular disease, unspecified: Secondary | ICD-10-CM | POA: Diagnosis not present

## 2016-03-29 DIAGNOSIS — Z7901 Long term (current) use of anticoagulants: Secondary | ICD-10-CM | POA: Diagnosis not present

## 2016-03-30 NOTE — Telephone Encounter (Signed)
Signed and placed in fax pile. Thank you so much.

## 2016-04-02 ENCOUNTER — Emergency Department (HOSPITAL_COMMUNITY)
Admission: EM | Admit: 2016-04-02 | Discharge: 2016-04-02 | Disposition: A | Discharge status: Home / Self Care | Payer: Medicare Other | Attending: Emergency Medicine | Admitting: Emergency Medicine

## 2016-04-02 ENCOUNTER — Encounter (HOSPITAL_COMMUNITY): Payer: Self-pay | Admitting: Emergency Medicine

## 2016-04-02 DIAGNOSIS — Z87891 Personal history of nicotine dependence: Secondary | ICD-10-CM | POA: Insufficient documentation

## 2016-04-02 DIAGNOSIS — Z7982 Long term (current) use of aspirin: Secondary | ICD-10-CM | POA: Insufficient documentation

## 2016-04-02 DIAGNOSIS — I10 Essential (primary) hypertension: Secondary | ICD-10-CM | POA: Insufficient documentation

## 2016-04-02 DIAGNOSIS — J449 Chronic obstructive pulmonary disease, unspecified: Secondary | ICD-10-CM | POA: Insufficient documentation

## 2016-04-02 DIAGNOSIS — Z79899 Other long term (current) drug therapy: Secondary | ICD-10-CM | POA: Diagnosis not present

## 2016-04-02 DIAGNOSIS — R040 Epistaxis: Secondary | ICD-10-CM | POA: Diagnosis not present

## 2016-04-02 MED ORDER — OXYMETAZOLINE HCL 0.05 % NA SOLN
1.0000 | Freq: Once | NASAL | Status: AC
  Administered 2016-04-02: 1 via NASAL
  Filled 2016-04-02: qty 15

## 2016-04-02 NOTE — ED Notes (Signed)
Pt on 3 liters of oxygen at home and is stating at 70%! Pt is currently on 3 liters and is stating at 81%

## 2016-04-02 NOTE — ED Triage Notes (Signed)
Pt c/o nose bleed since this morning. Pt has hx of the same due to chronic oxygen use for COPD and pulmonary hypertension. Pt sts that for weeks now she wakes up and pulls blood clots out of her L nostril and that she needs some help to prevent this from happening. Pt sts that normally she is on 3.5-4L O2 at home and O2 level stays in upper 80's to low 90's. At this time pt's O2 level is 73% on 4L. Pt O2 bumped up to 5L and pt is at 81%. Pt A&Ox4 and denies SOB or dizziness. Denies chest pain. Pt has L nares stuffed with tissue paper.

## 2016-04-02 NOTE — Discharge Instructions (Signed)
See your primary care physician for removal of packing in 3-4 days.

## 2016-04-02 NOTE — ED Notes (Signed)
Jeneen Rinks MD at bedside with ENT cart.

## 2016-04-02 NOTE — ED Provider Notes (Signed)
[facility] DEPT Provider Note   CSN: [id number] Arrival date & time: 04/02/16  1317     History   Chief Complaint Chief Complaint  Patient presents with  . Epistaxis    HPI Cristina Parker is a 80 y.o. female. She presents with complaint of nosebleed and has tissue paper in her left naris. She wears nasal cannula oxygen at all times at home. She has a humidifier that she uses sometimes with her oxygen. She had intermittent bleeding of the nose congestion and persistent bleeding since this morning and presents here.  HPI  Past Medical History:  Diagnosis Date  . Anemia   . Anxiety   . Arthritis   . Blood transfusion without reported diagnosis   . Cataract    BILATERAL  . Collagenous colitis   . Crohn's disease ([place])   . Depression   . Duodenal stricture   . Duodenal ulcer   . Esophageal reflux   . Heart disease   . Heart murmur   . Hiatal hernia   . Hyperlipemia   . Hypertension   . Osteoporosis   . Paroxysmal a-fib ([place]) 07/09/2011  . Peripheral vascular disease ([place])   . Pneumonia     Patient Active Problem List   Diagnosis Date Noted  . Centrilobular emphysema ([place]) 03/27/2016  . Pulmonary scarring 03/27/2016  . Physical deconditioning 03/27/2016  . Pulmonary hypertension 12/23/2015  . Hypoxemia 12/23/2015  . Pulmonary hyperinflation 11/10/2015  . Dyspnea 11/10/2015  . ILD (interstitial lung disease) ([place]) 09/15/2015  . Vitamin D deficiency 06/03/2015  . Chronic respiratory failure with hypoxia ([place]) 03/16/2015  . 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 Chronic pain syndrome 11/04/2014  . Carotid stenosis 04/06/2014  . Aftercare following surgery of the circulatory system 04/06/2014  . Cataract 09/25/2012  . Arthritis 09/25/2012  . Depression 09/25/2012  . Osteoporosis 09/25/2012  . Duodenal stricture 09/16/2012  . Murmur 12/19/2011  . Occlusion and stenosis of carotid artery without mention of cerebral infarction 11/15/2011  .  Paroxysmal a-fib ([place]) 07/09/2011  . COPD (chronic obstructive pulmonary disease) ([place]) 07/03/2011  . OTHER ULCERATIVE COLITIS 08/09/2009  . PVD 10/31/2007  . HIATAL HERNIA 10/31/2007  . Other and unspecified hyperlipidemia 07/18/2007  . Essential hypertension 07/18/2007  . Coronary atherosclerosis 07/18/2007  . VENTRICULAR ARRHYTHMIA 07/18/2007  . LUNG NODULE 07/18/2007  . G E R D 07/18/2007  . CAROTID ENDARTERECTOMY, LEFT, HX OF 07/18/2007    Past Surgical History:  Procedure Laterality Date  . ABDOMINAL HYSTERECTOMY    . 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 BUNIONECTOMY  1977   both feet   . CARDIAC CATHETERIZATION N/A 12/08/2015   Procedure: Right Heart Cath;  Surgeon: Larey Dresser, MD;  Location: [facility];  Service: Cardiovascular;  Laterality: N/A;  . CAROTID ENDARTERECTOMY  05-1998   Left CEA  . CAROTID ENDARTERECTOMY  04/11/11   Right CEA  . CERVICAL DISC SURGERY    . CESAREAN SECTION    . COLONOSCOPY    . CORONARY ARTERY BYPASS GRAFT    . EYE SURGERY  10/2010   left eye cataract  . FRACTURE SURGERY  1998   ORIF  . HIP SURGERY     right  . TUBAL LIGATION      OB History    No data available       Home Medications    Prior to Admission medications   Medication Sig Start Date End  Date Taking? Authorizing Provider  Ascorbic Acid (VITAMIN C) 1000 MG tablet Take 1,000 mg by mouth daily at 12 noon.    Yes Historical Provider, MD  aspirin 81 MG tablet Take 81 mg by mouth every morning.    Yes Historical Provider, MD  atorvastatin (LIPITOR) 40 MG tablet Take 1 tablet (40 mg total) by mouth daily. Patient taking differently: Take 40 mg by mouth every morning.  02/29/16  Yes Shawnee Knapp, MD  calcium carbonate (OS-CAL - DOSED IN MG OF ELEMENTAL CALCIUM) 1250 (500 Ca) MG tablet Take 1 tablet by mouth daily with breakfast.   Yes Historical Provider, MD  clopidogrel (PLAVIX) 75 MG tablet Take 1 tablet (75 mg total) by mouth  at bedtime. 02/29/16  Yes Shawnee Knapp, MD  escitalopram (LEXAPRO) 20 MG tablet Take 1 tablet (20 mg total) by mouth daily. Patient taking differently: Take 20 mg by mouth every morning.  02/29/16  Yes Shawnee Knapp, MD  HYDROcodone-acetaminophen (NORCO/VICODIN) 5-325 MG tablet Take 1 tablet by mouth every 8 (eight) hours as needed for moderate pain. 02/23/16  Yes Shawnee Knapp, MD  LORazepam (ATIVAN) 0.5 MG tablet TAKE 1/2 TABLET BY MOUTH TWICE DAILY AS NEEDED FOR ANXIETY Patient taking differently: TAKE 0.25 MG BY MOUTH TWICE DAILY AS NEEDED FOR ANXIETY 02/14/16  Yes Shawnee Knapp, MD  LUMIGAN 0.01 % SOLN Apply 1 drop in both eyes every night 03/13/15  Yes Historical Provider, MD  metoprolol succinate (TOPROL-XL) 50 MG 24 hr tablet Take 1 tablet (50 mg total) by mouth daily. with or immediately following a meal. 02/29/16  Yes Shawnee Knapp, MD  OXYGEN Inhale 2-3 L into the lungs daily. Oxygen 2lpm with rest and 3 lpm with exertion   Yes Historical Provider, MD  pantoprazole (PROTONIX) 40 MG tablet Take 40 mg by mouth every morning.    Yes Historical Provider, MD  cetirizine (ZYRTEC) 10 MG tablet Take 1 tablet (10 mg total) by mouth at bedtime. Patient not taking: Reported on 04/02/2016 08/24/15   Shawnee Knapp, MD  clonazePAM (KLONOPIN) 0.5 MG tablet Take 1 tablet (0.5 mg total) by mouth 2 (two) times daily as needed for anxiety. Patient not taking: Reported on 04/02/2016 02/23/16   Shawnee Knapp, MD  Tiotropium Bromide Monohydrate (SPIRIVA RESPIMAT) 2.5 MCG/ACT AERS Inhale 2 puffs into the lungs daily. Patient not taking: Reported on 04/02/2016 11/10/15   Brand Males, MD    Family History Family History  Problem Relation Age of Onset  . Colon polyps Father   . Heart disease Father     Before age 71  . Stroke Father   . Deep vein thrombosis Father   . Vascular Disease Brother   . Alcoholism Paternal Grandfather   . Heart disease Paternal Grandmother   . Hypertension Mother   . Varicose Veins Mother   .  Peripheral vascular disease Mother   . Osteoporosis Mother   . Heart disease Maternal Grandmother   . Colon cancer Neg Hx   . Esophageal cancer Neg Hx   . Rectal cancer Neg Hx   . Stomach cancer Neg Hx     Social History Social History  Substance Use Topics  . Smoking status: Former Smoker    Packs/day: 1.00    Years: 35.00    Types: Cigarettes    Quit date: 06/18/1996  . Smokeless tobacco: Never Used  . Alcohol use 4.2 - 8.4 oz/week    7 - 14 Standard drinks or  equivalent per week     Comment: WINE     Allergies   Review of patient's allergies indicates no known allergies.   Review of Systems Review of Systems  Constitutional: Negative for appetite change, chills, diaphoresis, fatigue and fever.  HENT: Positive for nosebleeds. Negative for mouth sores, sore throat and trouble swallowing.   Eyes: Negative for visual disturbance.  Respiratory: Negative for cough, chest tightness, shortness of breath and wheezing.   Cardiovascular: Negative for chest pain.  Gastrointestinal: Negative for abdominal distention, abdominal pain, diarrhea, nausea and vomiting.  Endocrine: Negative for polydipsia, polyphagia and polyuria.  Genitourinary: Negative for dysuria, frequency and hematuria.  Musculoskeletal: Negative for gait problem.  Skin: Negative for color change, pallor and rash.  Neurological: Negative for dizziness, syncope, light-headedness and headaches.  Hematological: Does not bruise/bleed easily.  Psychiatric/Behavioral: Negative for behavioral problems and confusion.     Physical Exam Updated Vital Signs BP 101/61   Pulse 77   Temp 97.9 F (36.6 C)   Resp 13   Ht _0  (1.499 m)   Wt 123 lb (55.8 kg)   SpO2 95%   BMI 24.84 kg/m   Physical Exam  Constitutional: She is oriented to person, place, and time. She appears well-developed and well-nourished. No distress.  HENT:  Head: Normocephalic.  Mucosal bleeding of the left anterior nasal septum is persistent.    Eyes: Conjunctivae are normal. Pupils are equal, round, and reactive to light. No scleral icterus.  Neck: Normal range of motion. Neck supple. No thyromegaly present.  Cardiovascular: Normal rate and regular rhythm.  Exam reveals no gallop and no friction rub.   No murmur heard. Pulmonary/Chest: Effort normal and breath sounds normal. No respiratory distress. She has no wheezes. She has no rales.  Abdominal: Soft. Bowel sounds are normal. She exhibits no distension. There is no tenderness. There is no rebound.  Musculoskeletal: Normal range of motion.  Neurological: She is alert and oriented to person, place, and time.  Skin: Skin is warm and dry. No rash noted.  Psychiatric: She has a normal mood and affect. Her behavior is normal.     ED Treatments / Results  Labs (all labs ordered are listed, but only abnormal results are displayed) Labs Reviewed - No data to display  EKG  EKG Interpretation None       Radiology No results found.  Procedures Procedures (including critical care time)  Medications Ordered in ED Medications  oxymetazoline (AFRIN) 0.05 % nasal spray 1 spray (1 spray Each Nare Given 04/02/16 1430)     Initial Impression / Assessment and Plan / ED Course  I have reviewed the triage vital signs and the nursing notes.  Pertinent labs & imaging results that were available during my care of the patient were reviewed by me and considered in my medical decision making (see chart for details).  Clinical Course    [place] with Neo-Synephrine placed in the naris for 20 minutes. Reinspection there is mild diffuse capillary bleeding from the left anterior septum and Little's area. Merocel sponge placed in the left naris and infiltrated with Neo-Synephrine. Excellent hemostasis. Her cannula was modified to give 4 L via only the right cannula in the right naris. Her saturations remain excellent she is appropriate for discharge. Packing removal in 4 days.  Final  Clinical Impressions(s) / ED Diagnoses   Final diagnoses:  Epistaxis    New Prescriptions New Prescriptions   No medications on file     Tanna Furry, MD  04/06/16 1019

## 2016-04-03 DIAGNOSIS — K219 Gastro-esophageal reflux disease without esophagitis: Secondary | ICD-10-CM | POA: Diagnosis not present

## 2016-04-03 DIAGNOSIS — Z7901 Long term (current) use of anticoagulants: Secondary | ICD-10-CM | POA: Diagnosis not present

## 2016-04-03 DIAGNOSIS — J449 Chronic obstructive pulmonary disease, unspecified: Secondary | ICD-10-CM | POA: Diagnosis not present

## 2016-04-03 DIAGNOSIS — E785 Hyperlipidemia, unspecified: Secondary | ICD-10-CM | POA: Diagnosis not present

## 2016-04-03 DIAGNOSIS — K449 Diaphragmatic hernia without obstruction or gangrene: Secondary | ICD-10-CM | POA: Diagnosis not present

## 2016-04-03 DIAGNOSIS — J849 Interstitial pulmonary disease, unspecified: Secondary | ICD-10-CM | POA: Diagnosis not present

## 2016-04-03 DIAGNOSIS — M545 Low back pain: Secondary | ICD-10-CM | POA: Diagnosis not present

## 2016-04-03 DIAGNOSIS — K5090 Crohn's disease, unspecified, without complications: Secondary | ICD-10-CM | POA: Diagnosis not present

## 2016-04-03 DIAGNOSIS — M25552 Pain in left hip: Secondary | ICD-10-CM | POA: Diagnosis not present

## 2016-04-03 DIAGNOSIS — J8410 Pulmonary fibrosis, unspecified: Secondary | ICD-10-CM | POA: Diagnosis not present

## 2016-04-03 DIAGNOSIS — Z9981 Dependence on supplemental oxygen: Secondary | ICD-10-CM | POA: Diagnosis not present

## 2016-04-03 DIAGNOSIS — I499 Cardiac arrhythmia, unspecified: Secondary | ICD-10-CM | POA: Diagnosis not present

## 2016-04-03 DIAGNOSIS — I10 Essential (primary) hypertension: Secondary | ICD-10-CM | POA: Diagnosis not present

## 2016-04-03 DIAGNOSIS — G8929 Other chronic pain: Secondary | ICD-10-CM | POA: Diagnosis not present

## 2016-04-03 DIAGNOSIS — M810 Age-related osteoporosis without current pathological fracture: Secondary | ICD-10-CM | POA: Diagnosis not present

## 2016-04-03 DIAGNOSIS — Z79891 Long term (current) use of opiate analgesic: Secondary | ICD-10-CM | POA: Diagnosis not present

## 2016-04-03 DIAGNOSIS — I739 Peripheral vascular disease, unspecified: Secondary | ICD-10-CM | POA: Diagnosis not present

## 2016-04-03 DIAGNOSIS — I2510 Atherosclerotic heart disease of native coronary artery without angina pectoris: Secondary | ICD-10-CM | POA: Diagnosis not present

## 2016-04-03 DIAGNOSIS — I270 Primary pulmonary hypertension: Secondary | ICD-10-CM | POA: Diagnosis not present

## 2016-04-03 DIAGNOSIS — I480 Paroxysmal atrial fibrillation: Secondary | ICD-10-CM | POA: Diagnosis not present

## 2016-04-04 DIAGNOSIS — E785 Hyperlipidemia, unspecified: Secondary | ICD-10-CM | POA: Diagnosis not present

## 2016-04-04 DIAGNOSIS — M25552 Pain in left hip: Secondary | ICD-10-CM | POA: Diagnosis not present

## 2016-04-04 DIAGNOSIS — K449 Diaphragmatic hernia without obstruction or gangrene: Secondary | ICD-10-CM | POA: Diagnosis not present

## 2016-04-04 DIAGNOSIS — Z79891 Long term (current) use of opiate analgesic: Secondary | ICD-10-CM | POA: Diagnosis not present

## 2016-04-04 DIAGNOSIS — K219 Gastro-esophageal reflux disease without esophagitis: Secondary | ICD-10-CM | POA: Diagnosis not present

## 2016-04-04 DIAGNOSIS — Z7901 Long term (current) use of anticoagulants: Secondary | ICD-10-CM | POA: Diagnosis not present

## 2016-04-04 DIAGNOSIS — J449 Chronic obstructive pulmonary disease, unspecified: Secondary | ICD-10-CM | POA: Diagnosis not present

## 2016-04-04 DIAGNOSIS — K5090 Crohn's disease, unspecified, without complications: Secondary | ICD-10-CM | POA: Diagnosis not present

## 2016-04-04 DIAGNOSIS — I270 Primary pulmonary hypertension: Secondary | ICD-10-CM | POA: Diagnosis not present

## 2016-04-04 DIAGNOSIS — J849 Interstitial pulmonary disease, unspecified: Secondary | ICD-10-CM | POA: Diagnosis not present

## 2016-04-04 DIAGNOSIS — I10 Essential (primary) hypertension: Secondary | ICD-10-CM | POA: Diagnosis not present

## 2016-04-04 DIAGNOSIS — I480 Paroxysmal atrial fibrillation: Secondary | ICD-10-CM | POA: Diagnosis not present

## 2016-04-04 DIAGNOSIS — M545 Low back pain: Secondary | ICD-10-CM | POA: Diagnosis not present

## 2016-04-04 DIAGNOSIS — I499 Cardiac arrhythmia, unspecified: Secondary | ICD-10-CM | POA: Diagnosis not present

## 2016-04-04 DIAGNOSIS — Z9981 Dependence on supplemental oxygen: Secondary | ICD-10-CM | POA: Diagnosis not present

## 2016-04-04 DIAGNOSIS — G8929 Other chronic pain: Secondary | ICD-10-CM | POA: Diagnosis not present

## 2016-04-04 DIAGNOSIS — I2510 Atherosclerotic heart disease of native coronary artery without angina pectoris: Secondary | ICD-10-CM | POA: Diagnosis not present

## 2016-04-04 DIAGNOSIS — M810 Age-related osteoporosis without current pathological fracture: Secondary | ICD-10-CM | POA: Diagnosis not present

## 2016-04-04 DIAGNOSIS — J8410 Pulmonary fibrosis, unspecified: Secondary | ICD-10-CM | POA: Diagnosis not present

## 2016-04-04 DIAGNOSIS — I739 Peripheral vascular disease, unspecified: Secondary | ICD-10-CM | POA: Diagnosis not present

## 2016-04-06 ENCOUNTER — Ambulatory Visit (INDEPENDENT_AMBULATORY_CARE_PROVIDER_SITE_OTHER): Payer: Medicare Other | Admitting: Family Medicine

## 2016-04-06 VITALS — BP 94/60 | HR 85 | Temp 97.9°F | Resp 20 | Ht 59.0 in | Wt 126.0 lb

## 2016-04-06 DIAGNOSIS — R0902 Hypoxemia: Secondary | ICD-10-CM | POA: Diagnosis not present

## 2016-04-06 DIAGNOSIS — R350 Frequency of micturition: Secondary | ICD-10-CM | POA: Diagnosis not present

## 2016-04-06 DIAGNOSIS — D62 Acute posthemorrhagic anemia: Secondary | ICD-10-CM

## 2016-04-06 LAB — POCT URINALYSIS DIP (MANUAL ENTRY)
BILIRUBIN UA: NEGATIVE
Bilirubin, UA: NEGATIVE
Blood, UA: NEGATIVE
Glucose, UA: NEGATIVE
LEUKOCYTES UA: NEGATIVE
NITRITE UA: NEGATIVE
PH UA: 6
Spec Grav, UA: 1.02
UROBILINOGEN UA: 0.2

## 2016-04-06 LAB — POCT CBC
Granulocyte percent: 81.7 %G — AB (ref 37–80)
HEMATOCRIT: 31 % — AB (ref 37.7–47.9)
HEMOGLOBIN: 10.3 g/dL — AB (ref 12.2–16.2)
LYMPH, POC: 1.1 (ref 0.6–3.4)
MCH, POC: 31.6 pg — AB (ref 27–31.2)
MCHC: 33.2 g/dL (ref 31.8–35.4)
MCV: 95.1 fL (ref 80–97)
MID (cbc): 0.4 (ref 0–0.9)
MPV: 6.2 fL (ref 0–99.8)
PLATELET COUNT, POC: 176 10*3/uL (ref 142–424)
POC GRANULOCYTE: 6.9 (ref 2–6.9)
POC LYMPH %: 13 % (ref 10–50)
POC MID %: 5.3 %M (ref 0–12)
RBC: 3.26 M/uL — AB (ref 4.04–5.48)
RDW, POC: 15.2 %
WBC: 8.4 10*3/uL (ref 4.6–10.2)

## 2016-04-06 NOTE — Progress Notes (Signed)
By signing my name below, I, Mesha Guinyard, attest that this documentation has been prepared under the direction and in the presence of Delman Cheadle, MD.  Electronically Signed: Verlee Monte, Medical Scribe. 04/06/16. 1:07 PM.  Subjective:    Patient ID: Cristina Parker, female    DOB: [birth year], 80 y.o.   MRN: [id number]  HPI Chief Complaint  Patient presents with  . Follow-up    nasal packing  . Fatigue    HPI Comments: Cristina Parker is a 80 y.o. female who presents to the [facility] for nasal packing follow-up. Pt was seen in the ER 4 day ago for epistaxis with left anterior septum. She was packed with a merocel sponge, infiltrated with Neo-Synephrine and told to blow in right naris. Advised to removed packing in 4 days. I last saw pt 6 weeks prior for her wellness visit. At that visit she was incidently found to have UTI and was treated with keflex. She was advise to inc protein supplement in det and her anemia was of concern due to GI problems. Pt was advised to take hemoccult cards, inc iron supplements and inc protein supplements. Hemoccult cards were neg x3.  Pt states her epistaxis was a continuous slow drip that continued for 6 hours before she went to the hospital. Pt states it would clot, but when she blows her nose her epistaxis would continue. Pt bought KY gel for her nose.  Pt reports dysuria, and she was compliant with Keflex. Pt has been taking 2 protein supplements a day and her bowels have been doing "okay".   Pt's oxygen level at home runs 88-91 when she works at it, but it normally runs in the 60s-70s. When pt moves too quick she'll feel a syncopal episode. Pt states her energy level varies on the day and suspects her stress decreases her energy. Pt states ativan works better.  Patient Active Problem List   Diagnosis Date Noted  . Centrilobular emphysema ([place]) 03/27/2016  . Pulmonary scarring 03/27/2016  . Physical deconditioning 03/27/2016  .  Pulmonary hypertension 12/23/2015  . Hypoxemia 12/23/2015  . Pulmonary hyperinflation 11/10/2015  . Dyspnea 11/10/2015  . ILD (interstitial lung disease) ([place]) 09/15/2015  . Vitamin D deficiency 06/03/2015  . Chronic respiratory failure with hypoxia ([place]) 03/16/2015  . Postinflammatory pulmonary fibrosis ([place]) 11/23/2014  . Chronic pain syndrome 11/04/2014  . Carotid stenosis 04/06/2014  . Aftercare following surgery of the circulatory system 04/06/2014  . Cataract 09/25/2012  . Arthritis 09/25/2012  . Depression 09/25/2012  . Osteoporosis 09/25/2012  . Duodenal stricture 09/16/2012  . Murmur 12/19/2011  . Occlusion and stenosis of carotid artery without mention of cerebral infarction 11/15/2011  . Paroxysmal a-fib ([place]) 07/09/2011  . COPD (chronic obstructive pulmonary disease) ([place]) 07/03/2011  . OTHER ULCERATIVE COLITIS 08/09/2009  . PVD 10/31/2007  . HIATAL HERNIA 10/31/2007  . Other and unspecified hyperlipidemia 07/18/2007  . Essential hypertension 07/18/2007  . Coronary atherosclerosis 07/18/2007  . VENTRICULAR ARRHYTHMIA 07/18/2007  . LUNG NODULE 07/18/2007  . G E R D 07/18/2007  . CAROTID ENDARTERECTOMY, LEFT, HX OF 07/18/2007   Past Medical History:  Diagnosis Date  . Anemia   . Anxiety   . Arthritis   . Blood transfusion without reported diagnosis   . Cataract    BILATERAL  . Collagenous colitis   . Crohn's disease ([place])   . Depression   . Duodenal stricture   . Duodenal ulcer   . Esophageal reflux   .  Heart disease   . Heart murmur   . Hiatal hernia   . Hyperlipemia   . Hypertension   . Osteoporosis   . Paroxysmal a-fib ([place]) 07/09/2011  . Peripheral vascular disease ([place])   . Pneumonia    Past Surgical History:  Procedure Laterality Date  . ABDOMINAL HYSTERECTOMY    . 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 BUNIONECTOMY  1977   both feet   . CARDIAC CATHETERIZATION N/A 12/08/2015   Procedure: Right Heart Cath;   Surgeon: Larey Dresser, MD;  Location: [facility];  Service: Cardiovascular;  Laterality: N/A;  . CAROTID ENDARTERECTOMY  05-1998   Left CEA  . CAROTID ENDARTERECTOMY  04/11/11   Right CEA  . CERVICAL DISC SURGERY    . CESAREAN SECTION    . COLONOSCOPY    . CORONARY ARTERY BYPASS GRAFT    . EYE SURGERY  10/2010   left eye cataract  . FRACTURE SURGERY  1998   ORIF  . HIP SURGERY     right  . TUBAL LIGATION     No Known Allergies Prior to Admission medications   Medication Sig Start Date End Date Taking? Authorizing Provider  Ascorbic Acid (VITAMIN C) 1000 MG tablet Take 1,000 mg by mouth daily at 12 noon.    Yes Historical Provider, MD  aspirin 81 MG tablet Take 81 mg by mouth every morning.    Yes Historical Provider, MD  atorvastatin (LIPITOR) 40 MG tablet Take 1 tablet (40 mg total) by mouth daily. Patient taking differently: Take 40 mg by mouth every morning.  02/29/16  Yes Shawnee Knapp, MD  calcium carbonate (OS-CAL - DOSED IN MG OF ELEMENTAL CALCIUM) 1250 (500 Ca) MG tablet Take 1 tablet by mouth daily with breakfast.   Yes Historical Provider, MD  clopidogrel (PLAVIX) 75 MG tablet Take 1 tablet (75 mg total) by mouth at bedtime. 02/29/16  Yes Shawnee Knapp, MD  escitalopram (LEXAPRO) 20 MG tablet Take 1 tablet (20 mg total) by mouth daily. Patient taking differently: Take 20 mg by mouth every morning.  02/29/16  Yes Shawnee Knapp, MD  HYDROcodone-acetaminophen (NORCO/VICODIN) 5-325 MG tablet Take 1 tablet by mouth every 8 (eight) hours as needed for moderate pain. 02/23/16  Yes Shawnee Knapp, MD  LORazepam (ATIVAN) 0.5 MG tablet TAKE 1/2 TABLET BY MOUTH TWICE DAILY AS NEEDED FOR ANXIETY Patient taking differently: TAKE 0.25 MG BY MOUTH TWICE DAILY AS NEEDED FOR ANXIETY 02/14/16  Yes Shawnee Knapp, MD  LUMIGAN 0.01 % SOLN Apply 1 drop in both eyes every night 03/13/15  Yes Historical Provider, MD  metoprolol succinate (TOPROL-XL) 50 MG 24 hr tablet Take 1 tablet (50 mg total) by mouth daily.  with or immediately following a meal. 02/29/16  Yes Shawnee Knapp, MD  OXYGEN Inhale 2-3 L into the lungs daily. Oxygen 2lpm with rest and 3 lpm with exertion   Yes Historical Provider, MD  pantoprazole (PROTONIX) 40 MG tablet Take 40 mg by mouth every morning.    Yes Historical Provider, MD  Tiotropium Bromide Monohydrate (SPIRIVA RESPIMAT) 2.5 MCG/ACT AERS Inhale 2 puffs into the lungs daily. 11/10/15  Yes Brand Males, MD  clonazePAM (KLONOPIN) 0.5 MG tablet Take 1 tablet (0.5 mg total) by mouth 2 (two) times daily as needed for anxiety. Patient not taking: Reported on 04/06/2016 02/23/16   Shawnee Knapp, MD   Social History   Social History  .  Marital status: Widowed    Spouse name: N/A  . Number of children: 2  . Years of education: N/A   Occupational History  . retired Therapist, sports    Social History Main Topics  . Smoking status: Former Smoker    Packs/day: 1.00    Years: 35.00    Types: Cigarettes    Quit date: 06/18/1996  . Smokeless tobacco: Never Used  . Alcohol use 4.2 - 8.4 oz/week    7 - 14 Standard drinks or equivalent per week     Comment: WINE  . Drug use: No  . Sexual activity: No   Other Topics Concern  . Not on file   Social History Narrative   Retired Marine scientist;   Lives with son and grandson   Review of Systems  Constitutional: Positive for fatigue.  HENT: Positive for nosebleeds.   Gastrointestinal: Negative for constipation and diarrhea.  Genitourinary: Positive for dysuria.  Neurological: Positive for syncope.   Objective:  Physical Exam  Constitutional: She appears well-developed and well-nourished. No distress.  HENT:  Head: Normocephalic and atraumatic.  Still very friable septum with bright red capillaries at the base  Eyes: Conjunctivae are normal.  Neck: Neck supple.  Cardiovascular: Normal rate.   Pulmonary/Chest: Effort normal.  Neurological: She is alert.  Skin: Skin is warm and dry.  Psychiatric: She has a normal mood and affect. Her behavior is  normal.  Nursing note and vitals reviewed.  BP 94/60 (BP Location: Right Arm, Patient Position: Sitting, Cuff Size: Normal)   Pulse 85   Temp 97.9 F (36.6 C) (Oral)   Resp 20   Ht _0  (1.499 m)   Wt 126 lb (57.2 kg)   SpO2 (!) 78%   BMI 25.45 kg/m    79 64 pulse on left arm 80 on right arm  Results for orders placed or performed in visit on 04/06/16  POCT CBC  Result Value Ref Range   WBC 8.4 4.6 - 10.2 K/uL   Lymph, poc 1.1 0.6 - 3.4   POC LYMPH PERCENT 13.0 10 - 50 %L   MID (cbc) 0.4 0 - 0.9   POC MID % 5.3 0 - 12 %M   POC Granulocyte 6.9 2 - 6.9   Granulocyte percent 81.7 (A) 37 - 80 %G   RBC 3.26 (A) 4.04 - 5.48 M/uL   Hemoglobin 10.3 (A) 12.2 - 16.2 g/dL   HCT, POC 31.0 (A) 37.7 - 47.9 %   MCV 95.1 80 - 97 fL   MCH, POC 31.6 (A) 27 - 31.2 pg   MCHC 33.2 31.8 - 35.4 g/dL   RDW, POC 15.2 %   Platelet Count, POC 176 142 - 424 K/uL   MPV 6.2 0 - 99.8 fL   Assessment & Plan:   1. Hypoxia   2. Acute blood loss anemia   3. Urinary frequency   Epistaxis due to friable vessels on left nare septum but has resolved with packing which was removed today w/o complication. Chronically hypoxic - O2 sats at baseline today, wearing continuous O2  Orders Placed This Encounter  Procedures  . Urine culture  . POCT CBC  . POCT urinalysis dipstick    I personally performed the services described in this documentation, which was scribed in my presence. The recorded information has been reviewed and considered, and addended by me as needed.   Delman Cheadle, M.D.  [facility] [address] [place], [place] [address] [phone]  [phone] phone [phone] fax  04/15/16 11:26 PM

## 2016-04-06 NOTE — Patient Instructions (Addendum)
IF you received an x-ray today, you will receive an invoice from [facility]. Please contact [facility] at [phone] with questions or concerns regarding your invoice.   IF you received labwork today, you will receive an invoice from [employer]. Please contact [facility] at [phone] with questions or concerns regarding your invoice.   Our billing staff will not be able to assist you with questions regarding bills from these companies.  You will be contacted with the lab results as soon as they are available. The fastest way to get your results is to activate your My Chart account. Instructions are located on the last page of this paperwork. If you have not heard from [place] regarding the results in 2 weeks, please contact this office.    Nosebleed Nosebleeds are common. They are due to a crack in the inside lining of your nose (mucous membrane) or from a small blood vessel that starts to bleed. Nosebleeds can be caused by many conditions, such as injury, infections, dry mucous membranes or dry climate, medicines, nose picking, and home heating and cooling systems. Most nosebleeds come from blood vessels in the front of your nose. HOME CARE INSTRUCTIONS   Try controlling your nosebleed by pinching your nostrils gently and continuously for at least 10 minutes.  Avoid blowing or sniffing your nose for a number of hours after having a nosebleed.  Do not put gauze inside your nose yourself. If your nose was packed by your health care provider, try to maintain the pack inside of your nose until your health care provider removes it.  If a gauze pack was used and it starts to fall out, gently replace it or cut off the end of it.  If a balloon catheter was used to pack your nose, do not cut or remove it unless your health care provider has instructed you to do that.  Avoid lying down while you are having a nosebleed. Sit up and lean forward.  Use  a nasal spray decongestant to help with a nosebleed as directed by your health care provider.  Do not use petroleum jelly or mineral oil in your nose. These can drip into your lungs.  Maintain humidity in your home by using less air conditioning or by using a humidifier.  Aspirinand blood thinners make bleeding more likely. If you are prescribed these medicines and you suffer from nosebleeds, ask your health care provider if you should stop taking the medicines or adjust the dose. Do not stop medicines unless directed by your health care provider  Resume your normal activities as you are able, but avoid straining, lifting, or bending at the waist for several days.  If your nosebleed was caused by dry mucous membranes, use over-the-counter saline nasal spray or gel. This will keep the mucous membranes moist and allow them to heal. If you must use a lubricant, choose the water-soluble variety. Use it only sparingly, and do not use it within several hours of lying down.  Keep all follow-up visits as directed by your health care provider. This is important. SEEK MEDICAL CARE IF:  You have a fever.  You get frequent nosebleeds.  You are getting nosebleeds more often. SEEK IMMEDIATE MEDICAL CARE IF:  Your nosebleed lasts longer than 20 minutes.  Your nosebleed occurs after an injury to your face, and your nose looks crooked or broken.  You have unusual bleeding from other parts of your body.  You have unusual bruising on other  parts of your body.  You feel light-headed or you faint.  You become sweaty.  You vomit blood.  Your nosebleed occurs after a head injury.   This information is not intended to replace advice given to you by your health care provider. Make sure you discuss any questions you have with your health care provider.   Document Released: 03/14/2005 Document Revised: 06/25/2014 Document Reviewed: 01/18/2014 Elsevier Interactive Patient Education [employer].

## 2016-04-08 ENCOUNTER — Other Ambulatory Visit: Payer: Self-pay | Admitting: Family Medicine

## 2016-04-08 LAB — URINE CULTURE

## 2016-04-09 DIAGNOSIS — M545 Low back pain: Secondary | ICD-10-CM | POA: Diagnosis not present

## 2016-04-09 DIAGNOSIS — J8410 Pulmonary fibrosis, unspecified: Secondary | ICD-10-CM | POA: Diagnosis not present

## 2016-04-09 DIAGNOSIS — G8929 Other chronic pain: Secondary | ICD-10-CM | POA: Diagnosis not present

## 2016-04-09 DIAGNOSIS — Z7901 Long term (current) use of anticoagulants: Secondary | ICD-10-CM | POA: Diagnosis not present

## 2016-04-09 DIAGNOSIS — I499 Cardiac arrhythmia, unspecified: Secondary | ICD-10-CM | POA: Diagnosis not present

## 2016-04-09 DIAGNOSIS — I480 Paroxysmal atrial fibrillation: Secondary | ICD-10-CM | POA: Diagnosis not present

## 2016-04-09 DIAGNOSIS — K219 Gastro-esophageal reflux disease without esophagitis: Secondary | ICD-10-CM | POA: Diagnosis not present

## 2016-04-09 DIAGNOSIS — I2510 Atherosclerotic heart disease of native coronary artery without angina pectoris: Secondary | ICD-10-CM | POA: Diagnosis not present

## 2016-04-09 DIAGNOSIS — K5090 Crohn's disease, unspecified, without complications: Secondary | ICD-10-CM | POA: Diagnosis not present

## 2016-04-09 DIAGNOSIS — I10 Essential (primary) hypertension: Secondary | ICD-10-CM | POA: Diagnosis not present

## 2016-04-09 DIAGNOSIS — E785 Hyperlipidemia, unspecified: Secondary | ICD-10-CM | POA: Diagnosis not present

## 2016-04-09 DIAGNOSIS — Z9981 Dependence on supplemental oxygen: Secondary | ICD-10-CM | POA: Diagnosis not present

## 2016-04-09 DIAGNOSIS — M810 Age-related osteoporosis without current pathological fracture: Secondary | ICD-10-CM | POA: Diagnosis not present

## 2016-04-09 DIAGNOSIS — K449 Diaphragmatic hernia without obstruction or gangrene: Secondary | ICD-10-CM | POA: Diagnosis not present

## 2016-04-09 DIAGNOSIS — M25552 Pain in left hip: Secondary | ICD-10-CM | POA: Diagnosis not present

## 2016-04-09 DIAGNOSIS — I270 Primary pulmonary hypertension: Secondary | ICD-10-CM | POA: Diagnosis not present

## 2016-04-09 DIAGNOSIS — J449 Chronic obstructive pulmonary disease, unspecified: Secondary | ICD-10-CM | POA: Diagnosis not present

## 2016-04-09 DIAGNOSIS — J849 Interstitial pulmonary disease, unspecified: Secondary | ICD-10-CM | POA: Diagnosis not present

## 2016-04-09 DIAGNOSIS — Z79891 Long term (current) use of opiate analgesic: Secondary | ICD-10-CM | POA: Diagnosis not present

## 2016-04-09 DIAGNOSIS — I739 Peripheral vascular disease, unspecified: Secondary | ICD-10-CM | POA: Diagnosis not present

## 2016-04-10 ENCOUNTER — Encounter: Payer: Self-pay | Admitting: Family

## 2016-04-10 NOTE — Telephone Encounter (Signed)
Yes, please call in rx for lorazepam 0.40m 1 tab po qhs prn insomnia. Disp: 30, refill 2.  Thank you

## 2016-04-11 ENCOUNTER — Telehealth: Payer: Self-pay | Admitting: Internal Medicine

## 2016-04-11 DIAGNOSIS — I2510 Atherosclerotic heart disease of native coronary artery without angina pectoris: Secondary | ICD-10-CM | POA: Diagnosis not present

## 2016-04-11 DIAGNOSIS — K219 Gastro-esophageal reflux disease without esophagitis: Secondary | ICD-10-CM | POA: Diagnosis not present

## 2016-04-11 DIAGNOSIS — I739 Peripheral vascular disease, unspecified: Secondary | ICD-10-CM | POA: Diagnosis not present

## 2016-04-11 DIAGNOSIS — Z9981 Dependence on supplemental oxygen: Secondary | ICD-10-CM | POA: Diagnosis not present

## 2016-04-11 DIAGNOSIS — Z79891 Long term (current) use of opiate analgesic: Secondary | ICD-10-CM | POA: Diagnosis not present

## 2016-04-11 DIAGNOSIS — K449 Diaphragmatic hernia without obstruction or gangrene: Secondary | ICD-10-CM | POA: Diagnosis not present

## 2016-04-11 DIAGNOSIS — M545 Low back pain: Secondary | ICD-10-CM | POA: Diagnosis not present

## 2016-04-11 DIAGNOSIS — I10 Essential (primary) hypertension: Secondary | ICD-10-CM | POA: Diagnosis not present

## 2016-04-11 DIAGNOSIS — J449 Chronic obstructive pulmonary disease, unspecified: Secondary | ICD-10-CM | POA: Diagnosis not present

## 2016-04-11 DIAGNOSIS — M810 Age-related osteoporosis without current pathological fracture: Secondary | ICD-10-CM | POA: Diagnosis not present

## 2016-04-11 DIAGNOSIS — I270 Primary pulmonary hypertension: Secondary | ICD-10-CM | POA: Diagnosis not present

## 2016-04-11 DIAGNOSIS — K5090 Crohn's disease, unspecified, without complications: Secondary | ICD-10-CM | POA: Diagnosis not present

## 2016-04-11 DIAGNOSIS — I499 Cardiac arrhythmia, unspecified: Secondary | ICD-10-CM | POA: Diagnosis not present

## 2016-04-11 DIAGNOSIS — I480 Paroxysmal atrial fibrillation: Secondary | ICD-10-CM | POA: Diagnosis not present

## 2016-04-11 DIAGNOSIS — J8410 Pulmonary fibrosis, unspecified: Secondary | ICD-10-CM | POA: Diagnosis not present

## 2016-04-11 DIAGNOSIS — G8929 Other chronic pain: Secondary | ICD-10-CM | POA: Diagnosis not present

## 2016-04-11 DIAGNOSIS — M25552 Pain in left hip: Secondary | ICD-10-CM | POA: Diagnosis not present

## 2016-04-11 DIAGNOSIS — J849 Interstitial pulmonary disease, unspecified: Secondary | ICD-10-CM | POA: Diagnosis not present

## 2016-04-11 DIAGNOSIS — E785 Hyperlipidemia, unspecified: Secondary | ICD-10-CM | POA: Diagnosis not present

## 2016-04-11 DIAGNOSIS — Z7901 Long term (current) use of anticoagulants: Secondary | ICD-10-CM | POA: Diagnosis not present

## 2016-04-11 NOTE — Telephone Encounter (Signed)
lmtcb x1 for Kath with [facility].

## 2016-04-11 NOTE — Telephone Encounter (Signed)
Called in and asked pharm to notify pt when ready.

## 2016-04-12 ENCOUNTER — Ambulatory Visit: Payer: Medicare Other | Admitting: Family

## 2016-04-12 ENCOUNTER — Encounter (HOSPITAL_COMMUNITY): Payer: Medicare Other

## 2016-04-12 NOTE — Telephone Encounter (Signed)
LM for [facility] with [facility]. Will await call back.

## 2016-04-12 NOTE — Telephone Encounter (Signed)
Juliann Pulse called for order to continue PT at home for patient - she can be reached at [phone]

## 2016-04-13 NOTE — Telephone Encounter (Signed)
Spoke with Juliann Pulse at [facility]. I have given her the verbal order to continue pt's home therapy. Nothing further was needed.

## 2016-04-14 DIAGNOSIS — J449 Chronic obstructive pulmonary disease, unspecified: Secondary | ICD-10-CM | POA: Diagnosis not present

## 2016-04-17 DIAGNOSIS — G8929 Other chronic pain: Secondary | ICD-10-CM | POA: Diagnosis not present

## 2016-04-17 DIAGNOSIS — Z79891 Long term (current) use of opiate analgesic: Secondary | ICD-10-CM | POA: Diagnosis not present

## 2016-04-17 DIAGNOSIS — I2510 Atherosclerotic heart disease of native coronary artery without angina pectoris: Secondary | ICD-10-CM | POA: Diagnosis not present

## 2016-04-17 DIAGNOSIS — M25552 Pain in left hip: Secondary | ICD-10-CM | POA: Diagnosis not present

## 2016-04-17 DIAGNOSIS — Z9981 Dependence on supplemental oxygen: Secondary | ICD-10-CM | POA: Diagnosis not present

## 2016-04-17 DIAGNOSIS — I270 Primary pulmonary hypertension: Secondary | ICD-10-CM | POA: Diagnosis not present

## 2016-04-17 DIAGNOSIS — I739 Peripheral vascular disease, unspecified: Secondary | ICD-10-CM | POA: Diagnosis not present

## 2016-04-17 DIAGNOSIS — J449 Chronic obstructive pulmonary disease, unspecified: Secondary | ICD-10-CM | POA: Diagnosis not present

## 2016-04-17 DIAGNOSIS — I10 Essential (primary) hypertension: Secondary | ICD-10-CM | POA: Diagnosis not present

## 2016-04-17 DIAGNOSIS — J849 Interstitial pulmonary disease, unspecified: Secondary | ICD-10-CM | POA: Diagnosis not present

## 2016-04-17 DIAGNOSIS — K5090 Crohn's disease, unspecified, without complications: Secondary | ICD-10-CM | POA: Diagnosis not present

## 2016-04-17 DIAGNOSIS — Z7901 Long term (current) use of anticoagulants: Secondary | ICD-10-CM | POA: Diagnosis not present

## 2016-04-17 DIAGNOSIS — I499 Cardiac arrhythmia, unspecified: Secondary | ICD-10-CM | POA: Diagnosis not present

## 2016-04-17 DIAGNOSIS — K449 Diaphragmatic hernia without obstruction or gangrene: Secondary | ICD-10-CM | POA: Diagnosis not present

## 2016-04-17 DIAGNOSIS — M810 Age-related osteoporosis without current pathological fracture: Secondary | ICD-10-CM | POA: Diagnosis not present

## 2016-04-17 DIAGNOSIS — J8410 Pulmonary fibrosis, unspecified: Secondary | ICD-10-CM | POA: Diagnosis not present

## 2016-04-17 DIAGNOSIS — I480 Paroxysmal atrial fibrillation: Secondary | ICD-10-CM | POA: Diagnosis not present

## 2016-04-17 DIAGNOSIS — E785 Hyperlipidemia, unspecified: Secondary | ICD-10-CM | POA: Diagnosis not present

## 2016-04-17 DIAGNOSIS — M545 Low back pain: Secondary | ICD-10-CM | POA: Diagnosis not present

## 2016-04-17 DIAGNOSIS — K219 Gastro-esophageal reflux disease without esophagitis: Secondary | ICD-10-CM | POA: Diagnosis not present

## 2016-05-18 ENCOUNTER — Telehealth: Payer: Self-pay

## 2016-05-18 NOTE — Telephone Encounter (Signed)
I contacted the patient regarding her prolia injection. Patient advised me she has not taken the prolia before and would not like to proceed with the injection at this time. I advised it was time for her follow up appointment with Dr. Loanne Drilling, but she declined to schedule her appointment. Pateint stated she would call back at a later time to schedule her appointment and readdress her prolia injections.

## 2016-05-23 ENCOUNTER — Encounter: Payer: Self-pay | Admitting: Family

## 2016-05-29 ENCOUNTER — Telehealth: Payer: Self-pay

## 2016-05-29 ENCOUNTER — Ambulatory Visit: Payer: Medicare Other | Admitting: Family

## 2016-05-29 ENCOUNTER — Encounter (HOSPITAL_COMMUNITY): Payer: Medicare Other

## 2016-05-29 NOTE — Telephone Encounter (Signed)
12/9/20176 form Confirmation of verbal order for home medical equipment Supplier [employer] [facility] Dr. Garen Lah COPD  J44.9 Equipment  Portable Oxygen Concentrator [phone])

## 2016-06-19 ENCOUNTER — Encounter (HOSPITAL_COMMUNITY): Payer: Self-pay | Admitting: Emergency Medicine

## 2016-06-19 ENCOUNTER — Inpatient Hospital Stay (HOSPITAL_COMMUNITY): Payer: Medicare Other

## 2016-06-19 ENCOUNTER — Inpatient Hospital Stay (HOSPITAL_COMMUNITY)
Admission: EM | Admit: 2016-06-19 | Discharge: 2016-06-22 | DRG: 871 | Disposition: A | Discharge status: Home / Self Care | Payer: Medicare Other | Attending: Internal Medicine | Admitting: Internal Medicine

## 2016-06-19 ENCOUNTER — Emergency Department (HOSPITAL_COMMUNITY): Payer: Medicare Other

## 2016-06-19 DIAGNOSIS — E785 Hyperlipidemia, unspecified: Secondary | ICD-10-CM | POA: Diagnosis present

## 2016-06-19 DIAGNOSIS — F419 Anxiety disorder, unspecified: Secondary | ICD-10-CM | POA: Diagnosis present

## 2016-06-19 DIAGNOSIS — J440 Chronic obstructive pulmonary disease with acute lower respiratory infection: Secondary | ICD-10-CM | POA: Diagnosis present

## 2016-06-19 DIAGNOSIS — I739 Peripheral vascular disease, unspecified: Secondary | ICD-10-CM | POA: Diagnosis present

## 2016-06-19 DIAGNOSIS — I2510 Atherosclerotic heart disease of native coronary artery without angina pectoris: Secondary | ICD-10-CM | POA: Diagnosis present

## 2016-06-19 DIAGNOSIS — I2721 Secondary pulmonary arterial hypertension: Secondary | ICD-10-CM | POA: Diagnosis present

## 2016-06-19 DIAGNOSIS — M810 Age-related osteoporosis without current pathological fracture: Secondary | ICD-10-CM | POA: Diagnosis present

## 2016-06-19 DIAGNOSIS — R7881 Bacteremia: Secondary | ICD-10-CM | POA: Diagnosis not present

## 2016-06-19 DIAGNOSIS — Z823 Family history of stroke: Secondary | ICD-10-CM

## 2016-06-19 DIAGNOSIS — J9621 Acute and chronic respiratory failure with hypoxia: Secondary | ICD-10-CM | POA: Diagnosis present

## 2016-06-19 DIAGNOSIS — J189 Pneumonia, unspecified organism: Secondary | ICD-10-CM | POA: Diagnosis present

## 2016-06-19 DIAGNOSIS — Z7982 Long term (current) use of aspirin: Secondary | ICD-10-CM

## 2016-06-19 DIAGNOSIS — J181 Lobar pneumonia, unspecified organism: Secondary | ICD-10-CM | POA: Diagnosis not present

## 2016-06-19 DIAGNOSIS — G8929 Other chronic pain: Secondary | ICD-10-CM | POA: Diagnosis present

## 2016-06-19 DIAGNOSIS — R0600 Dyspnea, unspecified: Secondary | ICD-10-CM

## 2016-06-19 DIAGNOSIS — J432 Centrilobular emphysema: Secondary | ICD-10-CM | POA: Diagnosis not present

## 2016-06-19 DIAGNOSIS — Z419 Encounter for procedure for purposes other than remedying health state, unspecified: Secondary | ICD-10-CM | POA: Diagnosis not present

## 2016-06-19 DIAGNOSIS — M1990 Unspecified osteoarthritis, unspecified site: Secondary | ICD-10-CM | POA: Diagnosis present

## 2016-06-19 DIAGNOSIS — Z9981 Dependence on supplemental oxygen: Secondary | ICD-10-CM

## 2016-06-19 DIAGNOSIS — I110 Hypertensive heart disease with heart failure: Secondary | ICD-10-CM | POA: Diagnosis present

## 2016-06-19 DIAGNOSIS — K219 Gastro-esophageal reflux disease without esophagitis: Secondary | ICD-10-CM | POA: Diagnosis present

## 2016-06-19 DIAGNOSIS — F329 Major depressive disorder, single episode, unspecified: Secondary | ICD-10-CM | POA: Diagnosis present

## 2016-06-19 DIAGNOSIS — I959 Hypotension, unspecified: Secondary | ICD-10-CM | POA: Diagnosis present

## 2016-06-19 DIAGNOSIS — D72829 Elevated white blood cell count, unspecified: Secondary | ICD-10-CM | POA: Diagnosis not present

## 2016-06-19 DIAGNOSIS — Z66 Do not resuscitate: Secondary | ICD-10-CM | POA: Diagnosis present

## 2016-06-19 DIAGNOSIS — Z87891 Personal history of nicotine dependence: Secondary | ICD-10-CM

## 2016-06-19 DIAGNOSIS — R0602 Shortness of breath: Secondary | ICD-10-CM | POA: Diagnosis present

## 2016-06-19 DIAGNOSIS — I480 Paroxysmal atrial fibrillation: Secondary | ICD-10-CM | POA: Diagnosis present

## 2016-06-19 DIAGNOSIS — Z90710 Acquired absence of both cervix and uterus: Secondary | ICD-10-CM

## 2016-06-19 DIAGNOSIS — Z8249 Family history of ischemic heart disease and other diseases of the circulatory system: Secondary | ICD-10-CM

## 2016-06-19 DIAGNOSIS — I5032 Chronic diastolic (congestive) heart failure: Secondary | ICD-10-CM | POA: Diagnosis present

## 2016-06-19 DIAGNOSIS — L8990 Pressure ulcer of unspecified site, unspecified stage: Secondary | ICD-10-CM | POA: Insufficient documentation

## 2016-06-19 DIAGNOSIS — Z1611 Resistance to penicillins: Secondary | ICD-10-CM | POA: Diagnosis present

## 2016-06-19 DIAGNOSIS — Z1623 Resistance to quinolones and fluoroquinolones: Secondary | ICD-10-CM | POA: Diagnosis present

## 2016-06-19 DIAGNOSIS — I272 Pulmonary hypertension, unspecified: Secondary | ICD-10-CM

## 2016-06-19 DIAGNOSIS — R945 Abnormal results of liver function studies: Secondary | ICD-10-CM | POA: Diagnosis present

## 2016-06-19 DIAGNOSIS — D649 Anemia, unspecified: Secondary | ICD-10-CM | POA: Diagnosis present

## 2016-06-19 DIAGNOSIS — Z1639 Resistance to other specified antimicrobial drug: Secondary | ICD-10-CM | POA: Diagnosis present

## 2016-06-19 DIAGNOSIS — Z7902 Long term (current) use of antithrombotics/antiplatelets: Secondary | ICD-10-CM

## 2016-06-19 DIAGNOSIS — Z8262 Family history of osteoporosis: Secondary | ICD-10-CM

## 2016-06-19 DIAGNOSIS — Z951 Presence of aortocoronary bypass graft: Secondary | ICD-10-CM

## 2016-06-19 DIAGNOSIS — Z8711 Personal history of peptic ulcer disease: Secondary | ICD-10-CM

## 2016-06-19 DIAGNOSIS — E872 Acidosis: Secondary | ICD-10-CM | POA: Diagnosis present

## 2016-06-19 DIAGNOSIS — N390 Urinary tract infection, site not specified: Secondary | ICD-10-CM | POA: Diagnosis present

## 2016-06-19 DIAGNOSIS — K5090 Crohn's disease, unspecified, without complications: Secondary | ICD-10-CM | POA: Diagnosis present

## 2016-06-19 DIAGNOSIS — B9620 Unspecified Escherichia coli [E. coli] as the cause of diseases classified elsewhere: Secondary | ICD-10-CM | POA: Diagnosis not present

## 2016-06-19 DIAGNOSIS — J984 Other disorders of lung: Secondary | ICD-10-CM

## 2016-06-19 DIAGNOSIS — A4151 Sepsis due to Escherichia coli [E. coli]: Secondary | ICD-10-CM

## 2016-06-19 DIAGNOSIS — F32A Depression, unspecified: Secondary | ICD-10-CM | POA: Diagnosis present

## 2016-06-19 DIAGNOSIS — Z79899 Other long term (current) drug therapy: Secondary | ICD-10-CM

## 2016-06-19 DIAGNOSIS — J441 Chronic obstructive pulmonary disease with (acute) exacerbation: Secondary | ICD-10-CM

## 2016-06-19 DIAGNOSIS — R748 Abnormal levels of other serum enzymes: Secondary | ICD-10-CM | POA: Diagnosis present

## 2016-06-19 DIAGNOSIS — R3915 Urgency of urination: Secondary | ICD-10-CM | POA: Diagnosis not present

## 2016-06-19 HISTORY — DX: Centrilobular emphysema: J43.2

## 2016-06-19 HISTORY — DX: Pulmonary hypertension, unspecified: I27.20

## 2016-06-19 LAB — CBC WITH DIFFERENTIAL/PLATELET
BASOS PCT: 0 %
Basophils Absolute: 0 10*3/uL (ref 0.0–0.1)
EOS ABS: 0.2 10*3/uL (ref 0.0–0.7)
Eosinophils Relative: 2 %
HEMATOCRIT: 38.8 % (ref 36.0–46.0)
HEMOGLOBIN: 12 g/dL (ref 12.0–15.0)
LYMPHS ABS: 1.4 10*3/uL (ref 0.7–4.0)
Lymphocytes Relative: 15 %
MCH: 30 pg (ref 26.0–34.0)
MCHC: 30.9 g/dL (ref 30.0–36.0)
MCV: 97 fL (ref 78.0–100.0)
MONOS PCT: 1 %
Monocytes Absolute: 0.1 10*3/uL (ref 0.1–1.0)
NEUTROS ABS: 7.9 10*3/uL — AB (ref 1.7–7.7)
Neutrophils Relative %: 82 %
Platelets: 191 10*3/uL (ref 150–400)
RBC: 4 MIL/uL (ref 3.87–5.11)
RDW: 14.9 % (ref 11.5–15.5)
WBC: 9.6 10*3/uL (ref 4.0–10.5)

## 2016-06-19 LAB — I-STAT CG4 LACTIC ACID, ED: LACTIC ACID, VENOUS: 3.62 mmol/L — AB (ref 0.5–1.9)

## 2016-06-19 LAB — PROTIME-INR
INR: 1.07
PROTHROMBIN TIME: 14 s (ref 11.4–15.2)

## 2016-06-19 LAB — I-STAT CHEM 8, ED
BUN: 20 mg/dL (ref 6–20)
Calcium, Ion: 1.11 mmol/L — ABNORMAL LOW (ref 1.15–1.40)
Chloride: 99 mmol/L — ABNORMAL LOW (ref 101–111)
Creatinine, Ser: 0.8 mg/dL (ref 0.44–1.00)
Glucose, Bld: 157 mg/dL — ABNORMAL HIGH (ref 65–99)
HEMATOCRIT: 40 % (ref 36.0–46.0)
HEMOGLOBIN: 13.6 g/dL (ref 12.0–15.0)
POTASSIUM: 4.5 mmol/L (ref 3.5–5.1)
Sodium: 139 mmol/L (ref 135–145)
TCO2: 37 mmol/L (ref 0–100)

## 2016-06-19 LAB — BLOOD CULTURE ID PANEL (REFLEXED)
ACINETOBACTER BAUMANNII: NOT DETECTED
CANDIDA ALBICANS: NOT DETECTED
CANDIDA GLABRATA: NOT DETECTED
CANDIDA KRUSEI: NOT DETECTED
CANDIDA PARAPSILOSIS: NOT DETECTED
Candida tropicalis: NOT DETECTED
Carbapenem resistance: NOT DETECTED
ENTEROBACTERIACEAE SPECIES: DETECTED — AB
ENTEROCOCCUS SPECIES: NOT DETECTED
ESCHERICHIA COLI: DETECTED — AB
Enterobacter cloacae complex: NOT DETECTED
Haemophilus influenzae: NOT DETECTED
KLEBSIELLA OXYTOCA: NOT DETECTED
KLEBSIELLA PNEUMONIAE: NOT DETECTED
LISTERIA MONOCYTOGENES: NOT DETECTED
Neisseria meningitidis: NOT DETECTED
PSEUDOMONAS AERUGINOSA: NOT DETECTED
Proteus species: NOT DETECTED
STAPHYLOCOCCUS AUREUS BCID: NOT DETECTED
STREPTOCOCCUS PNEUMONIAE: NOT DETECTED
STREPTOCOCCUS PYOGENES: NOT DETECTED
Serratia marcescens: NOT DETECTED
Staphylococcus species: NOT DETECTED
Streptococcus agalactiae: NOT DETECTED
Streptococcus species: NOT DETECTED

## 2016-06-19 LAB — COMPREHENSIVE METABOLIC PANEL
ALK PHOS: 203 U/L — AB (ref 38–126)
ALT: 64 U/L — AB (ref 14–54)
ANION GAP: 11 (ref 5–15)
AST: 128 U/L — ABNORMAL HIGH (ref 15–41)
Albumin: 3.6 g/dL (ref 3.5–5.0)
BUN: 13 mg/dL (ref 6–20)
CALCIUM: 9 mg/dL (ref 8.9–10.3)
CO2: 28 mmol/L (ref 22–32)
CREATININE: 0.9 mg/dL (ref 0.44–1.00)
Chloride: 98 mmol/L — ABNORMAL LOW (ref 101–111)
GFR calc non Af Amer: 60 mL/min — ABNORMAL LOW (ref >60)
Glucose, Bld: 154 mg/dL — ABNORMAL HIGH (ref 65–99)
Potassium: 4.7 mmol/L (ref 3.5–5.1)
SODIUM: 137 mmol/L (ref 135–145)
TOTAL PROTEIN: 6.9 g/dL (ref 6.5–8.1)
Total Bilirubin: 1.3 mg/dL — ABNORMAL HIGH (ref 0.3–1.2)

## 2016-06-19 LAB — PROCALCITONIN: Procalcitonin: 6 ng/mL

## 2016-06-19 LAB — HEPATIC FUNCTION PANEL
ALT: 48 U/L (ref 14–54)
AST: 74 U/L — ABNORMAL HIGH (ref 15–41)
Albumin: 2.7 g/dL — ABNORMAL LOW (ref 3.5–5.0)
Alkaline Phosphatase: 138 U/L — ABNORMAL HIGH (ref 38–126)
BILIRUBIN DIRECT: 0.2 mg/dL (ref 0.1–0.5)
BILIRUBIN INDIRECT: 0.4 mg/dL (ref 0.3–0.9)
TOTAL PROTEIN: 5.4 g/dL — AB (ref 6.5–8.1)
Total Bilirubin: 0.6 mg/dL (ref 0.3–1.2)

## 2016-06-19 LAB — LACTIC ACID, PLASMA
LACTIC ACID, VENOUS: 2.3 mmol/L — AB (ref 0.5–1.9)
LACTIC ACID, VENOUS: 1.8 mmol/L (ref 0.5–1.9)
LACTIC ACID, VENOUS: 1.8 mmol/L (ref 0.5–1.9)
Lactic Acid, Venous: 3.3 mmol/L (ref 0.5–1.9)
Lactic Acid, Venous: 4.6 mmol/L (ref 0.5–1.9)

## 2016-06-19 LAB — I-STAT TROPONIN, ED: TROPONIN I, POC: 0.01 ng/mL (ref 0.00–0.08)

## 2016-06-19 LAB — BRAIN NATRIURETIC PEPTIDE: B Natriuretic Peptide: 742.3 pg/mL — ABNORMAL HIGH (ref 0.0–100.0)

## 2016-06-19 MED ORDER — SODIUM CHLORIDE 0.9 % IV BOLUS (SEPSIS)
500.0000 mL | Freq: Once | INTRAVENOUS | Status: AC
Start: 2016-06-19 — End: 2016-06-19
  Administered 2016-06-19: 500 mL via INTRAVENOUS

## 2016-06-19 MED ORDER — ONDANSETRON HCL 4 MG/2ML IJ SOLN
4.0000 mg | Freq: Three times a day (TID) | INTRAMUSCULAR | Status: DC | PRN
Start: 2016-06-19 — End: 2016-06-20

## 2016-06-19 MED ORDER — SODIUM CHLORIDE 0.9 % IV BOLUS (SEPSIS)
500.0000 mL | Freq: Once | INTRAVENOUS | Status: AC
  Administered 2016-06-19: 500 mL via INTRAVENOUS

## 2016-06-19 MED ORDER — LATANOPROST 0.005 % OP SOLN
1.0000 [drp] | Freq: Every day | OPHTHALMIC | Status: DC
  Administered 2016-06-19 – 2016-06-21 (×3): 1 [drp] via OPHTHALMIC
  Filled 2016-06-19: qty 2.5

## 2016-06-19 MED ORDER — MACITENTAN 10 MG PO TABS
10.0000 mg | ORAL_TABLET | Freq: Every day | ORAL | Status: DC
  Administered 2016-06-19 – 2016-06-22 (×3): 10 mg via ORAL
  Filled 2016-06-19 (×4): qty 1

## 2016-06-19 MED ORDER — PANTOPRAZOLE SODIUM 40 MG PO TBEC
40.0000 mg | DELAYED_RELEASE_TABLET | Freq: Every day | ORAL | Status: DC
  Administered 2016-06-19 – 2016-06-22 (×4): 40 mg via ORAL
  Filled 2016-06-19 (×4): qty 1

## 2016-06-19 MED ORDER — CEFEPIME HCL 2 G IJ SOLR
2.0000 g | Freq: Once | INTRAMUSCULAR | Status: AC
  Administered 2016-06-19: 2 g via INTRAVENOUS
  Filled 2016-06-19: qty 2

## 2016-06-19 MED ORDER — LORAZEPAM 0.5 MG PO TABS
0.5000 mg | ORAL_TABLET | Freq: Every evening | ORAL | Status: DC | PRN
Start: 2016-06-19 — End: 2016-06-22

## 2016-06-19 MED ORDER — BUDESONIDE 0.5 MG/2ML IN SUSP
0.5000 mg | Freq: Two times a day (BID) | RESPIRATORY_TRACT | Status: DC
  Administered 2016-06-19 – 2016-06-22 (×4): 0.5 mg via RESPIRATORY_TRACT
  Filled 2016-06-19 (×5): qty 2

## 2016-06-19 MED ORDER — SODIUM CHLORIDE 0.9 % IV BOLUS (SEPSIS): 1000.0000 mL | Freq: Once | INTRAVENOUS | Status: DC

## 2016-06-19 MED ORDER — ATORVASTATIN CALCIUM 40 MG PO TABS
40.0000 mg | ORAL_TABLET | Freq: Every day | ORAL | Status: DC
  Administered 2016-06-19 – 2016-06-22 (×4): 40 mg via ORAL
  Filled 2016-06-19 (×4): qty 1

## 2016-06-19 MED ORDER — ASPIRIN 81 MG PO TABS: 81.0000 mg | ORAL_TABLET | ORAL | Status: DC

## 2016-06-19 MED ORDER — FLUTICASONE FUROATE-VILANTEROL 100-25 MCG/INH IN AEPB
1.0000 | INHALATION_SPRAY | Freq: Every day | RESPIRATORY_TRACT | Status: DC
  Administered 2016-06-19: 12:00:00 1 via RESPIRATORY_TRACT
  Filled 2016-06-19: qty 28

## 2016-06-19 MED ORDER — CLOPIDOGREL BISULFATE 75 MG PO TABS
75.0000 mg | ORAL_TABLET | Freq: Every day | ORAL | Status: DC
  Administered 2016-06-19 – 2016-06-21 (×3): 75 mg via ORAL
  Filled 2016-06-19 (×3): qty 1

## 2016-06-19 MED ORDER — DEXTROSE 5 % IV SOLN
500.0000 mg | INTRAVENOUS | Status: DC
  Administered 2016-06-19 – 2016-06-20 (×2): 500 mg via INTRAVENOUS
  Filled 2016-06-19 (×3): qty 500

## 2016-06-19 MED ORDER — VANCOMYCIN HCL IN DEXTROSE 1-5 GM/200ML-% IV SOLN
1000.0000 mg | Freq: Once | INTRAVENOUS | Status: AC
  Administered 2016-06-19: 1000 mg via INTRAVENOUS
  Filled 2016-06-19: qty 200

## 2016-06-19 MED ORDER — HYDROCODONE-ACETAMINOPHEN 5-325 MG PO TABS
1.0000 | ORAL_TABLET | Freq: Three times a day (TID) | ORAL | Status: DC | PRN
  Administered 2016-06-20 – 2016-06-21 (×2): 1 via ORAL
  Filled 2016-06-19 (×2): qty 1

## 2016-06-19 MED ORDER — ESCITALOPRAM OXALATE 10 MG PO TABS
20.0000 mg | ORAL_TABLET | Freq: Every day | ORAL | Status: DC
  Administered 2016-06-19 – 2016-06-22 (×4): 20 mg via ORAL
  Filled 2016-06-19 (×4): qty 2

## 2016-06-19 MED ORDER — ALBUTEROL SULFATE (2.5 MG/3ML) 0.083% IN NEBU: 5.0000 mg | INHALATION_SOLUTION | RESPIRATORY_TRACT | Status: DC | PRN

## 2016-06-19 MED ORDER — ASPIRIN EC 81 MG PO TBEC
81.0000 mg | DELAYED_RELEASE_TABLET | Freq: Every day | ORAL | Status: DC
  Administered 2016-06-20 – 2016-06-22 (×3): 81 mg via ORAL
  Filled 2016-06-19 (×3): qty 1

## 2016-06-19 MED ORDER — SODIUM CHLORIDE 0.9 % IV SOLN
INTRAVENOUS | Status: AC
  Administered 2016-06-19: 12:00:00 via INTRAVENOUS

## 2016-06-19 MED ORDER — ENOXAPARIN SODIUM 40 MG/0.4ML ~~LOC~~ SOLN
40.0000 mg | SUBCUTANEOUS | Status: DC
  Administered 2016-06-19 – 2016-06-21 (×3): 40 mg via SUBCUTANEOUS
  Filled 2016-06-19 (×3): qty 0.4

## 2016-06-19 MED ORDER — ALBUTEROL SULFATE (2.5 MG/3ML) 0.083% IN NEBU
5.0000 mg | INHALATION_SOLUTION | Freq: Once | RESPIRATORY_TRACT | Status: AC
  Administered 2016-06-19: 5 mg via RESPIRATORY_TRACT
  Filled 2016-06-19: qty 6

## 2016-06-19 MED ORDER — IOPAMIDOL (ISOVUE-370) INJECTION 76%
INTRAVENOUS | Status: AC
  Administered 2016-06-19: 100 mL via INTRAVENOUS
  Filled 2016-06-19: qty 100

## 2016-06-19 MED ORDER — DEXTROSE 5 % IV SOLN
1.0000 g | INTRAVENOUS | Status: DC
  Administered 2016-06-19: 1 g via INTRAVENOUS
  Filled 2016-06-19 (×2): qty 10

## 2016-06-19 MED ORDER — ARFORMOTEROL TARTRATE 15 MCG/2ML IN NEBU
15.0000 ug | INHALATION_SOLUTION | Freq: Two times a day (BID) | RESPIRATORY_TRACT | Status: DC
  Administered 2016-06-19 – 2016-06-22 (×4): 15 ug via RESPIRATORY_TRACT
  Filled 2016-06-19 (×5): qty 2

## 2016-06-19 NOTE — Consult Note (Signed)
PCCM Consult Note  Admission date: 06/19/2016 Consult date: 06/19/2016 Referring provider: Dr. Posey Pronto, IMTS  CC: Short of breath  HPI: 81 yo female presented with one day of increased cough, dyspnea.  She feels congested in her chest, but hasn't been able to bring up sputum.  She also had chills, but is not sure if she had fever.  She has been been getting sinus congestion, and noticed a sore throat also.  She is followed by Dr. Chase Caller for COPD and chronic hypoxic respiratory failure.  She is followed by Dr. Marigene Ehlers for pulmonary hypertension.  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  She denies recent sick exposures.  She denies chest pain, abdominal pain, nausea, diarrhea, swelling, or skin rashes.  In the ER she was noted to have low blood pressure.  She was given IV fluids.  Lactic acid and procalcitonin were elevated.  She had CT chest which showed infiltrate.  She was started on antibiotics.  She required bipap transiently for hypoxia, but has transitioned off this.  She is DNR/DNI.  PMHx: She  has a past medical history of Anemia; Anxiety; Arthritis; Blood transfusion without reported diagnosis; Cataract; Collagenous colitis; Crohn's disease ([place]); Depression; Duodenal stricture; Duodenal ulcer; Esophageal reflux; Heart disease; Heart murmur; Hiatal hernia; Hyperlipemia; Hypertension; Osteoporosis; Paroxysmal a-fib ([place]) (07/09/2011); Peripheral vascular disease ([place]); and Pneumonia.  PSHx: She  has a past surgical history that includes Coronary artery bypass graft; Hip surgery; Abdominal hysterectomy; Cervical disc surgery; Bunionectomy (1977); Fracture surgery (1998); Eye surgery (10/2010); Carotid endarterectomy ([id number]); Carotid endarterectomy (04/11/11); Aorto- innominate BPG (2005); Colonoscopy; Cesarean section; Tubal ligation; and Cardiac catheterization (N/A, 12/08/2015).  FHx: Her family history includes Alcoholism in her paternal grandfather; Colon polyps in her  father; Deep vein thrombosis in her father; Heart disease in her father, maternal grandmother, and paternal grandmother; Hypertension in her mother; Osteoporosis in her mother; Peripheral vascular disease in her mother; Stroke in her father; Varicose Veins in her mother; Vascular Disease in her brother. There is no history of Colon cancer, Esophageal cancer, Rectal cancer, or Stomach cancer.  SHx: She  reports that she quit smoking about 20 years ago. Her smoking use included Cigarettes. She has a 35.00 pack-year smoking history. She has never used smokeless tobacco. She reports that she drinks about 4.2 - 8.4 oz of alcohol per week . She reports that she does not use drugs.  Allergies: No Known Allergies   No current facility-administered medications on file prior to encounter.    Current Outpatient Prescriptions on File Prior to Encounter  Medication Sig  . Ascorbic Acid (VITAMIN C) 1000 MG tablet Take 1,000 mg by mouth daily at 12 noon.   Marland Kitchen aspirin 81 MG tablet Take 81 mg by mouth every morning.   Marland Kitchen atorvastatin (LIPITOR) 40 MG tablet Take 1 tablet (40 mg total) by mouth daily. (Patient taking differently: Take 40 mg by mouth every morning. )  . calcium carbonate (OS-CAL - DOSED IN MG OF ELEMENTAL CALCIUM) 1250 (500 Ca) MG tablet Take 1 tablet by mouth daily with breakfast.  . clopidogrel (PLAVIX) 75 MG tablet Take 1 tablet (75 mg total) by mouth at bedtime.  Marland Kitchen escitalopram (LEXAPRO) 20 MG tablet Take 1 tablet (20 mg total) by mouth daily. (Patient taking differently: Take 20 mg by mouth every morning. )  . HYDROcodone-acetaminophen (NORCO/VICODIN) 5-325 MG tablet Take 1 tablet by mouth every 8 (eight) hours as needed for moderate pain.  Marland Kitchen LORazepam (ATIVAN) 0.5 MG  tablet Take 1 tablet (0.5 mg total) by mouth at bedtime as needed for sleep.  Marland Kitchen LUMIGAN 0.01 % SOLN Apply 1 drop in both eyes every night  . metoprolol succinate (TOPROL-XL) 50 MG 24 hr tablet Take 1 tablet (50 mg total) by mouth  daily. with or immediately following a meal.  . OXYGEN Inhale 2-3 L into the lungs daily. Oxygen 2lpm with rest and 3 lpm with exertion  . pantoprazole (PROTONIX) 40 MG tablet Take 40 mg by mouth every morning.     ROS: Negative except above.  Subjective: Feels congested in her chest.  Vital signs: BP (!) 89/60   Pulse 86   Temp 98.4 F (36.9 C) (Oral)   Resp 19   SpO2 94%   Intake/output: No intake/output data recorded.  General: pleasant, thin Neuro: alert, normal strength HEENT: no stridor, no LAN Cardiac: regular, no murmur Chest: basilar wheeze and crackles Rt > Lt, decreased BS in upper lung fields Abd: soft, non tender, + bowel sounds Ext: no edema Skin: no rashes   CMP Latest Ref Rng & Units 06/19/2016 06/19/2016 06/19/2016  Glucose 65 - 99 mg/dL - 157(H) 154(H)  BUN 6 - 20 mg/dL - 20 13  Creatinine 0.44 - 1.00 mg/dL - 0.80 0.90  Sodium 135 - 145 mmol/L - 139 137  Potassium 3.5 - 5.1 mmol/L - 4.5 4.7  Chloride 101 - 111 mmol/L - 99(L) 98(L)  CO2 22 - 32 mmol/L - - 28  Calcium 8.9 - 10.3 mg/dL - - 9.0  Total Protein 6.5 - 8.1 g/dL 5.4(L) - 6.9  Total Bilirubin 0.3 - 1.2 mg/dL 0.6 - 1.3(H)  Alkaline Phos 38 - 126 U/L 138(H) - 203(H)  AST 15 - 41 U/L 74(H) - 128(H)  ALT 14 - 54 U/L 48 - 64(H)     CBC Latest Ref Rng & Units 06/19/2016 06/19/2016 04/06/2016  WBC 4.0 - 10.5 K/uL - 9.6 8.4  Hemoglobin 12.0 - 15.0 g/dL 13.6 12.0 10.3(A)  Hematocrit 36.0 - 46.0 % 40.0 38.8 31.0(A)  Platelets 150 - 400 K/uL - 191 -     ABG    Component Value Date/Time   HCO3 30.6 (H) 12/08/2015 1023   HCO3 28.7 (H) 12/08/2015 1023   TCO2 37 06/19/2016 0552   O2SAT 57.0 12/08/2015 1023   O2SAT 51.0 12/08/2015 1023     CBG (last 3)  No results for input(s): GLUCAP in the last 72 hours.   Imaging: Ct Angio Chest Pe W Or Wo Contrast  Result Date: 06/19/2016 CLINICAL DATA:  Shortness of Breath EXAM: CT ANGIOGRAPHY CHEST WITH CONTRAST TECHNIQUE: Multidetector CT imaging of the  chest was performed using the standard protocol during bolus administration of intravenous contrast. Multiplanar CT image reconstructions and MIPs were obtained to evaluate the vascular anatomy. CONTRAST:  100 mL Isovue 370 nonionic COMPARISON:  Chest CT September 22, 2015 and chest radiograph June 19, 2016 FINDINGS: Cardiovascular: There is no focal pulmonary embolus. There is no appreciable thoracic aortic aneurysm or dissection. There is extensive calcification throughout the proximal visualized great vessels. There is extensive atherosclerotic calcification in the aorta. There is extensive coronary artery calcification. The pericardium is not appreciably thickened. There is mild generalized cardiomegaly. Mediastinum/Nodes: Thyroid appears unremarkable. There are multiple small mediastinal lymph nodes. There is a sub- carinal lymph node measuring 1.7 x 1.3 cm, mildly prominent. There is a right hilar lymph node measuring 1.2 x 1.0 cm, borderline prominent. There is a small hiatal hernia. There is  esophageal thickening throughout much of the esophagus. Scattered foci of esophageal air noted throughout the esophagus. Lungs/Pleura: There are bilateral pleural effusions, larger on the left than on the right. There is consolidation in the posterior left base. There is atelectatic change in the right base. There is interstitial pulmonary edema in the bases. Fluid tracks along the right major and minor fissures. Upper Abdomen: In the visualized upper abdomen, there is atherosclerotic calcification in the aorta. The liver has a somewhat nodular contour, raising concern for a degree of underlying cirrhosis. A small adenoma in the right adrenal gland is stable measuring 1.3 x 1.0 cm. A similar adenoma is noted in the left adrenal measuring 1.0 x 0.9 cm. There is a granuloma in the left lobe of the liver. Musculoskeletal: There is degenerative change throughout the thoracic spine. There are multiple anterior wedge compression  fractures, stable. No blastic or lytic lesions evident. There is old trauma involving the right proximal humerus with remodeling and advanced avascular necrosis in the right humeral head. There is postoperative change in the lower cervical spine. Review of the MIP images confirms the above findings. IMPRESSION: No demonstrable pulmonary embolus. Extensive atherosclerotic calcification. Findings felt to represent a degree of congestive heart failure. Consolidation in the left lower lobe posteriorly, likely primarily due to atelectasis, although a degree of superimposed pneumonia must be questioned. Mildly prominent right hilar and sub- carinal lymph nodes may well be of reactive etiology. Small hiatal hernia with thickening in the esophagus, likely due to chronic reflux esophagitis. Small benign adrenal adenomas. Calcified granuloma in the liver. The current with the liver raises question of a degree of underlying cirrhosis. Advanced avascular necrosis and remodeling right humeral head. Multiple stable compression fractures in the thoracic spine. Electronically Signed   By: Lowella Grip III M.D.   On: 06/19/2016 12:23   Dg Chest Port 1 View  Result Date: 06/19/2016 CLINICAL DATA:  Worsening dyspnea and cough for 1 day. EXAM: PORTABLE CHEST 1 VIEW COMPARISON:  04/07/2015 FINDINGS: Moderate vascular and interstitial prominence. Moderate cardiomegaly. Patchy airspace opacities in the central and basilar regions bilaterally, worse on the left. Probable left pleural effusion. IMPRESSION: The findings may represent congestive heart failure. Cannot exclude infectious infiltrate in the left base. Electronically Signed   By: Andreas Newport M.D.   On: 06/19/2016 05:52   Dg Chest 1 View  Result Date: 06/19/2016 CLINICAL DATA:  Shortness of breath and dyspnea. EXAM: CHEST 1 VIEW COMPARISON:  AP portable from earlier today. FINDINGS: Single lateral view of the chest shows posterior basilar atelectasis or pneumonia.  Probable small left pleural effusion. Diffuse interstitial opacity again noted. Bones are diffusely demineralized with compression deformity lower thoracic spine stable since CT scan of 09/22/2015. IMPRESSION: Pulmonary edema with posterior basilar atelectasis or infiltrate and small pleural effusion. Electronically Signed   By: Misty Stanley M.D.   On: 06/19/2016 09:40     Studies: Serology 09/15/15 >> ACE 29, RF < 10, anti CCP < 16, SSA/SSB < 1, SCL 70 < 1, RNP < 1, Aldolase 5.4, HP panel negative Echo 11/03/15 >> EF 50 to 53%, grade 2 diastolic DD, mild MR, PAS 69 mmHg PFT 11/10/15 >> FEV1 1.00 (64%), FEV1% 82, TLC 4.18 (97%), DLCO 25% RHC 12/08/15 >> RA 10, RV 70/15, PA 66/19/41, PCWP 16, CI (Fick) 2.2, PVR 7.6 WU CT angio chest 06/20/15 >> coronary calcification, borderline LAN up to 1.7 cm, small HH, esophageal thickening, b/l pleural effusions Lt > Rt, Lt lower lung  consolidation, interstitial edema  Antibiotics: Cefepime 1/02 >> 1/02 Vancomycin 1/02 >> 1/02 Rocephin 1/02 >> Zithromax 1/02 >>  Cultures: Blood 1/02 >> Respiratory viral panel 1/02 >> Legionella Ag 1/02 >> Pneumococcal Ag 1/02 >>  Lines/tubes:  Events: 1/02 Admit  Summary: 81 yo female with CAP, sepsis, acute on chronic hypoxic respiratory failure.  She has hx of COPD with emphysema, diastolic CHF, pulmonary hypertension.  Assessment/plan:  Acute on chronic hypoxic respiratory failure. - oxygen to keep SpO2 90 to 95% - Bipap as needed  Sepsis from community acquired pneumonia with elevated lactic acid and procalcitonin. - continue IV fluid - f/u lactic acid, procalcitionin - check RSV, legionella Ag pneumococcal Ag - day 1 rocephin, zithromax  COPD with emphysema. - brovana, pulmicort, prn albuterol  WHO group 2 and 3 pulmonary hypertension. - continue macitentan  DVT prophylaxis - lovenox SUP - protonix Nutrition - heart healthy diet Goals of care - DNR/DNI  Chesley Mires, MD [place] 06/19/2016, 3:54 PM Pager:  [phone] After 3pm call: [phone]

## 2016-06-19 NOTE — Progress Notes (Signed)
CRITICAL VALUE ALERT  Critical value received:  Lactic acid 4.6  Date of notification:  06/19/16  Time of notification:  1518  Critical value read back:Yes.    Nurse who received alert:  Saddie Benders  MD notified (1st page):  Teaching service pager  Time of first page:  1740  MD notified (2nd page):  Time of second page:  Responding MD:  Eda Paschal  Time MD responded:  1800  Coming to see the client.

## 2016-06-19 NOTE — Progress Notes (Signed)
Paged Internal medicine teaching services about patient's respiratory status. Oxygen saturation staying around 86-88% even on 4 liters O2 nasal canula. Not in distress. But patient did verbalize to me that she might need to use BIPAP during the night. MD aware. Stated to keep oxygen sats above 88% and will round up soon to assess patient. Will continue to monitor closely.

## 2016-06-19 NOTE — Progress Notes (Addendum)
IMTS Cross Cover Progress Note  Cristina Parker is a 81yo female admitted for acute on chronic respiratory failure and though to be septic 2/2 CAP. Patient was evaluated at bedside. She was on 5L [place] and satting initially in low 80's; she had no complaints of shortness of breath, chest pain, or distress. Pulse ox was moved from ear to right index finger and reading 88%. [place] oxygen titrated up to 7L with saturation at 91%.   Physical Exam: Constitutional: NAD, calm CV: RRR, no murmurs, rubs or gallops Resp: no increased work of breathing, decreased breath sounds throughout with mild wheezing, no crackles or rhonchi appreciated Abd: soft, +BS, NDNT Ext: warm, pulses intact, no edema  Assessment & Plan: Patient with increased oxygen requirement from baseline and in no respiratory distress. Will keep monitoring respiratory status. No current need for BiPAP at this time. LA 4.6>1.1.  --[place] O2 to maintain O2 sats 90-95% (currently 7L with humidifier) --BiPAP as needed --continue trending LA  --continue current antibiotic regimen (Azithro/Ceftriaxone) --continue Brovana, Pulmicort sched; albuterol q2hr PRN  Alphonzo Grieve, MD IMTS - PGY1 Pager [phone]

## 2016-06-19 NOTE — ED Triage Notes (Signed)
Pt transported from home by EMS after worsening shob for last several hours. Per son pt did not feel well all day yesterday. IV est by EMS 22 R wrist, Solumedrol 171m IVP, Duoneb x 2 with addt Albuterol. Pt on BIPAP on arrival.  Per EMS initial sat 75-80 % on 4L home 02.

## 2016-06-19 NOTE — ED Notes (Signed)
Dr. Posey Pronto aware of pt's elevated Lactic no new orders at this time

## 2016-06-19 NOTE — Progress Notes (Addendum)
PHARMACY - PHYSICIAN COMMUNICATION CRITICAL VALUE ALERT - BLOOD CULTURE IDENTIFICATION (BCID)  Results for orders placed or performed during the hospital encounter of 06/19/16  Blood Culture ID Panel (Reflexed) (Collected: 06/19/2016  5:45 AM)  Result Value Ref Range   Enterococcus species NOT DETECTED NOT DETECTED   Listeria monocytogenes NOT DETECTED NOT DETECTED   Staphylococcus species NOT DETECTED NOT DETECTED   Staphylococcus aureus NOT DETECTED NOT DETECTED   Streptococcus species NOT DETECTED NOT DETECTED   Streptococcus agalactiae NOT DETECTED NOT DETECTED   Streptococcus pneumoniae NOT DETECTED NOT DETECTED   Streptococcus pyogenes NOT DETECTED NOT DETECTED   Acinetobacter baumannii NOT DETECTED NOT DETECTED   Enterobacteriaceae species DETECTED (A) NOT DETECTED   Enterobacter cloacae complex NOT DETECTED NOT DETECTED   Escherichia coli DETECTED (A) NOT DETECTED   Klebsiella oxytoca NOT DETECTED NOT DETECTED   Klebsiella pneumoniae NOT DETECTED NOT DETECTED   Proteus species NOT DETECTED NOT DETECTED   Serratia marcescens NOT DETECTED NOT DETECTED   Carbapenem resistance NOT DETECTED NOT DETECTED   Haemophilus influenzae NOT DETECTED NOT DETECTED   Neisseria meningitidis NOT DETECTED NOT DETECTED   Pseudomonas aeruginosa NOT DETECTED NOT DETECTED   Candida albicans NOT DETECTED NOT DETECTED   Candida glabrata NOT DETECTED NOT DETECTED   Candida krusei NOT DETECTED NOT DETECTED   Candida parapsilosis NOT DETECTED NOT DETECTED   Candida tropicalis NOT DETECTED NOT DETECTED    Name of physician (or Provider) Contacted: Dr. Jari Favre  Changes to prescribed antibiotics required: No change for now. Patient on Ceftriaxone - continue and follow-up sensitivities.   Brain Hilts 06/19/2016  8:09 PM

## 2016-06-19 NOTE — ED Notes (Signed)
Pt is hypotensive but has no symptoms; EDP aware. 500 cc of fluid started

## 2016-06-19 NOTE — ED Notes (Signed)
Pt back from CT appears in no distress placed back on monitor

## 2016-06-19 NOTE — ED Notes (Signed)
Pt d/c of bipap per PA instruction. Placed on 5L 02 by [place]. Pt states she feels much better at this time. Pt drinking water at this time.  Pt states she normally sats 88-91% on home 02.

## 2016-06-19 NOTE — ED Provider Notes (Signed)
[facility] DEPT Provider Note   CSN: [id number] Arrival date & time: 06/19/16  0533     History   Chief Complaint Chief Complaint  Patient presents with  . Shortness of Breath    HPI Cristina Parker is a 81 y.o. female.  HPI Cristina Parker is a 81 y.o. female with history of anemia, Coronary disease, COPD, Crohn's disease, hypoxia, paroxysmal A. fib, hypertension, presents to the emergency department complaining of shortness of breath. Patient states her shortness of breath started yesterday. She reports chills, but states she did not Have fever. She reports some nasal congestion over last week, but denies any sore throat or increased cough. She states her breathing got worse this morning so she called 911. Patient reports one episode of nausea yesterday, denies any vomiting. No diarrhea. Denies any chest pain, abdominal pain. No back pain. Denies any urinary symptoms. Denies any swelling in extremities. Denies any pain in her legs. No other complaints.  Past Medical History:  Diagnosis Date  . Anemia   . Anxiety   . Arthritis   . Blood transfusion without reported diagnosis   . Cataract    BILATERAL  . Collagenous colitis   . Crohn's disease ([place])   . Depression   . Duodenal stricture   . Duodenal ulcer   . Esophageal reflux   . Heart disease   . Heart murmur   . Hiatal hernia   . Hyperlipemia   . Hypertension   . Osteoporosis   . Paroxysmal a-fib ([place]) 07/09/2011  . Peripheral vascular disease ([place])   . Pneumonia     Patient Active Problem List   Diagnosis Date Noted  . Centrilobular emphysema ([place]) 03/27/2016  . Pulmonary scarring 03/27/2016  . Physical deconditioning 03/27/2016  . Pulmonary hypertension 12/23/2015  . Hypoxemia 12/23/2015  . Pulmonary hyperinflation 11/10/2015  . Dyspnea 11/10/2015  . ILD (interstitial lung disease) ([place]) 09/15/2015  . Vitamin D deficiency 06/03/2015  . Chronic respiratory failure with hypoxia ([place]) 03/16/2015  .  Postinflammatory pulmonary fibrosis ([place]) 11/23/2014  . Chronic pain syndrome 11/04/2014  . Carotid stenosis 04/06/2014  . Aftercare following surgery of the circulatory system 04/06/2014  . Cataract 09/25/2012  . Arthritis 09/25/2012  . Depression 09/25/2012  . Osteoporosis 09/25/2012  . Duodenal stricture 09/16/2012  . Murmur 12/19/2011  . Occlusion and stenosis of carotid artery without mention of cerebral infarction 11/15/2011  . Paroxysmal a-fib ([place]) 07/09/2011  . COPD (chronic obstructive pulmonary disease) ([place]) 07/03/2011  . OTHER ULCERATIVE COLITIS 08/09/2009  . PVD 10/31/2007  . HIATAL HERNIA 10/31/2007  . Other and unspecified hyperlipidemia 07/18/2007  . Essential hypertension 07/18/2007  . Coronary atherosclerosis 07/18/2007  . VENTRICULAR ARRHYTHMIA 07/18/2007  . LUNG NODULE 07/18/2007  . G E R D 07/18/2007  . CAROTID ENDARTERECTOMY, LEFT, HX OF 07/18/2007    Past Surgical History:  Procedure Laterality Date  . ABDOMINAL HYSTERECTOMY    . 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 BUNIONECTOMY  1977   both feet   . CARDIAC CATHETERIZATION N/A 12/08/2015   Procedure: Right Heart Cath;  Surgeon: Larey Dresser, MD;  Location: [facility];  Service: Cardiovascular;  Laterality: N/A;  . CAROTID ENDARTERECTOMY  05-1998   Left CEA  . CAROTID ENDARTERECTOMY  04/11/11   Right CEA  . CERVICAL DISC SURGERY    . CESAREAN SECTION    . COLONOSCOPY    . CORONARY ARTERY BYPASS GRAFT    .  EYE SURGERY  10/2010   left eye cataract  . FRACTURE SURGERY  1998   ORIF  . HIP SURGERY     right  . TUBAL LIGATION      OB History    No data available       Home Medications    Prior to Admission medications   Medication Sig Start Date End Date Taking? Authorizing Provider  Ascorbic Acid (VITAMIN C) 1000 MG tablet Take 1,000 mg by mouth daily at 12 noon.     Historical Provider, MD  aspirin 81 MG tablet Take 81 mg by mouth every  morning.     Historical Provider, MD  atorvastatin (LIPITOR) 40 MG tablet Take 1 tablet (40 mg total) by mouth daily. Patient taking differently: Take 40 mg by mouth every morning.  02/29/16   Shawnee Knapp, MD  calcium carbonate (OS-CAL - DOSED IN MG OF ELEMENTAL CALCIUM) 1250 (500 Ca) MG tablet Take 1 tablet by mouth daily with breakfast.    Historical Provider, MD  clopidogrel (PLAVIX) 75 MG tablet Take 1 tablet (75 mg total) by mouth at bedtime. 02/29/16   Shawnee Knapp, MD  escitalopram (LEXAPRO) 20 MG tablet Take 1 tablet (20 mg total) by mouth daily. Patient taking differently: Take 20 mg by mouth every morning.  02/29/16   Shawnee Knapp, MD  HYDROcodone-acetaminophen (NORCO/VICODIN) 5-325 MG tablet Take 1 tablet by mouth every 8 (eight) hours as needed for moderate pain. 02/23/16   Shawnee Knapp, MD  LORazepam (ATIVAN) 0.5 MG tablet Take 1 tablet (0.5 mg total) by mouth at bedtime as needed for sleep. 04/10/16   Shawnee Knapp, MD  LUMIGAN 0.01 % SOLN Apply 1 drop in both eyes every night 03/13/15   Historical Provider, MD  metoprolol succinate (TOPROL-XL) 50 MG 24 hr tablet Take 1 tablet (50 mg total) by mouth daily. with or immediately following a meal. 02/29/16   Shawnee Knapp, MD  OXYGEN Inhale 2-3 L into the lungs daily. Oxygen 2lpm with rest and 3 lpm with exertion    Historical Provider, MD  pantoprazole (PROTONIX) 40 MG tablet Take 40 mg by mouth every morning.     Historical Provider, MD  Tiotropium Bromide Monohydrate (SPIRIVA RESPIMAT) 2.5 MCG/ACT AERS Inhale 2 puffs into the lungs daily. 11/10/15   Brand Males, MD    Family History Family History  Problem Relation Age of Onset  . Colon polyps Father   . Heart disease Father     Before age 75  . Stroke Father   . Deep vein thrombosis Father   . Vascular Disease Brother   . Alcoholism Paternal Grandfather   . Heart disease Paternal Grandmother   . Hypertension Mother   . Varicose Veins Mother   . Peripheral vascular disease Mother   .  Osteoporosis Mother   . Heart disease Maternal Grandmother   . Colon cancer Neg Hx   . Esophageal cancer Neg Hx   . Rectal cancer Neg Hx   . Stomach cancer Neg Hx     Social History Social History  Substance Use Topics  . Smoking status: Former Smoker    Packs/day: 1.00    Years: 35.00    Types: Cigarettes    Quit date: 06/18/1996  . Smokeless tobacco: Never Used  . Alcohol use 4.2 - 8.4 oz/week    7 - 14 Standard drinks or equivalent per week     Comment: WINE     Allergies  Patient has no known allergies.   Review of Systems Review of Systems  Constitutional: Positive for chills. Negative for fever.  Respiratory: Positive for shortness of breath and wheezing. Negative for cough and chest tightness.   Cardiovascular: Negative for chest pain, palpitations and leg swelling.  Gastrointestinal: Positive for nausea. Negative for abdominal pain, diarrhea and vomiting.  Genitourinary: Negative for dysuria, flank pain and pelvic pain.  Musculoskeletal: Negative for arthralgias, myalgias, neck pain and neck stiffness.  Skin: Negative for rash.  Neurological: Negative for dizziness, weakness and headaches.  All other systems reviewed and are negative.    Physical Exam Updated Vital Signs BP 135/98 (BP Location: Right Arm)   Pulse (!) 126   Temp 98.3 F (36.8 C) (Axillary)   Resp 22   SpO2 96%   Physical Exam  Constitutional: She appears well-developed and well-nourished. No distress.  HENT:  Head: Normocephalic.  Eyes: Conjunctivae are normal.  Neck: Neck supple.  Cardiovascular: Normal rate, regular rhythm and normal heart sounds.   Pulmonary/Chest: Effort normal. No respiratory distress. She has wheezes. She has no rales.  Abdominal: Soft. Bowel sounds are normal. She exhibits no distension. There is no tenderness. There is no rebound.  Musculoskeletal: She exhibits no edema.  Neurological: She is alert.  Skin: Skin is warm and dry.  Psychiatric: She has a normal  mood and affect. Her behavior is normal.  Nursing note and vitals reviewed.    ED Treatments / Results  Labs (all labs ordered are listed, but only abnormal results are displayed) Labs Reviewed  CBC WITH DIFFERENTIAL/PLATELET - Abnormal; Notable for the following:       Result Value   Neutro Abs 7.9 (*)    All other components within normal limits  COMPREHENSIVE METABOLIC PANEL - Abnormal; Notable for the following:    Chloride 98 (*)    Glucose, Bld 154 (*)    AST 128 (*)    ALT 64 (*)    Alkaline Phosphatase 203 (*)    Total Bilirubin 1.3 (*)    GFR calc non Af Amer 60 (*)    All other components within normal limits  I-STAT CHEM 8, ED - Abnormal; Notable for the following:    Chloride 99 (*)    Glucose, Bld 157 (*)    Calcium, Ion 1.11 (*)    All other components within normal limits  I-STAT CG4 LACTIC ACID, ED - Abnormal; Notable for the following:    Lactic Acid, Venous 3.62 (*)    All other components within normal limits  CULTURE, BLOOD (ROUTINE X 2)  CULTURE, BLOOD (ROUTINE X 2)  PROTIME-INR  BRAIN NATRIURETIC PEPTIDE  I-STAT TROPOININ, ED    EKG  EKG Interpretation  Date/Time:  Tuesday June 19 2016 05:33:23 EST Ventricular Rate:  132 PR Interval:    QRS Duration: 82 QT Interval:  225 QTC Calculation: 334 R Axis:   83 Text Interpretation:  Atrial fibrillation with RVR Borderline right axis deviation Probable LVH with secondary repol abnrm ST depression, probably rate related Baseline wander in lead(s) II III aVF a fib is new Confirmed by Glynn Octave [phone]) on 06/19/2016 5:48:37 AM       Radiology Dg Chest Port 1 View  Result Date: 06/19/2016 CLINICAL DATA:  Worsening dyspnea and cough for 1 day. EXAM: PORTABLE CHEST 1 VIEW COMPARISON:  04/07/2015 FINDINGS: Moderate vascular and interstitial prominence. Moderate cardiomegaly. Patchy airspace opacities in the central and basilar regions bilaterally, worse on the left. Probable left  pleural  effusion. IMPRESSION: The findings may represent congestive heart failure. Cannot exclude infectious infiltrate in the left base. Electronically Signed   By: Andreas Newport M.D.   On: 06/19/2016 05:52    Procedures Procedures (including critical care time)  Medications Ordered in ED Medications  ceFEPIme (MAXIPIME) 2 g in dextrose 5 % 50 mL IVPB (not administered)  vancomycin (VANCOCIN) IVPB 1000 mg/200 mL premix (not administered)  sodium chloride 0.9 % bolus 500 mL (not administered)     Initial Impression / Assessment and Plan / ED Course  I have reviewed the triage vital signs and the nursing notes.  Pertinent labs & imaging results that were available during my care of the patient were reviewed by me and considered in my medical decision making (see chart for details).  Clinical Course     Patient seen and examined. Patient initially on BiPAP. States she feels much better. She received total of 3 treatments, Solu-Medrol. She continues to have mild expiratory wheezes on exam. Decreased air movement bilaterally. We will try taking her off of BiPAP and put on nasal cannula. Chest x-ray showing possible pneumonia versus CHF. Labs pending.  6:49 AM Patient is on 5 L nasal cannula, tolerating it well so far. She continues to say she feels much better. Lactic acid is elevated at 3.62, question whether this could be from albuterol versus infectious. We did order her antibiotics to cover for pneumonia. Her white blood cell count is normal. She is afebrile here. EKG showing A. fib. Heart rate improved, currently in the 90s. We'll give 500 mL bolus of fluids. I do not think patient is septic.  Spoke with teaching service, will admit pt.     Final Clinical Impressions(s) / ED Diagnoses   Final diagnoses:  COPD exacerbation ([place] )  Community acquired pneumonia, unspecified laterality  Dyspnea    New Prescriptions New Prescriptions   No medications on file     Jeannett Senior,  PA-C 06/20/16 [place]    Everlene Balls, MD 06/21/16 1843

## 2016-06-19 NOTE — ED Notes (Signed)
Placed patient on the bedpan patient is resting

## 2016-06-19 NOTE — H&P (Signed)
Date: 06/19/2016               Patient Name:  Cristina Parker MRN: [id number]  DOB: 06-21-1935 Age / Sex: 81 y.o., female   PCP: Shawnee Knapp, MD         Medical Service: Internal Medicine Teaching Service         Attending Physician: Dr. Aldine Contes, MD    First Contact: Dr. Marjory Sneddon Pager: [phone]  Second Contact: Dr. 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  Last night, she noted difficulty breathing which was associated with chills just prior to bedtime. Around 4:00 this morning, she got up to use the restroom at which point her difficulty breathing worsened and was associated with some mild wheezing and congestion in her chest. Normally, her dyspnea improves with rest, and as it did not, she called EMS. At baseline she wears 3-4 L oxygen and has dyspnea with ADLs, like ambulation, putting on clothes, toileting, and bathing. She lives at home with her son, daughter-in-law, grandson all of whom have not been sick recently. Otherwise, she denies cough, fever, chest pain, chest tightness, leg swelling, orthopnea, prior history of DVT/PE and intubation. She smoked an unknown quantity for 25-35 years and drinks a glass of wine daily, but denies any prior history of illicit drug use.  Per review her chart, she was noted have grade 2 diastolic dysfunction with preserved ejection  fraction 50-55% back in May 2016. PFTs at that time were notable for FEV1 64% of predicted, increased FEV1/FVC 110%, and reduced DLCO 25% consistent with a moderate restrictive pattern. She underwent right heart catheterization in June which was notable for borderline elevated wedge pressure 16 mm Hg, pulmonary artery pressure 41 mm Hg, and moderate pulmonary arterial hypertension. Her cause was attributed to interstitial lung disease though high-resolution chest CT in April was without evidence of interstitial lung disease but did show pulmonary parenchymal scarring in the right hemithorax She was started on bacitentan and followed up with pulmonology in October at which time she declined pulmonary rehabilitation. She acknowledged adherence to bacitentan and has not missed any doses.  Upon arrival, EMS gave her Solu-Medrol and started her on BiPAP. In the emergency department, EKG was notable for A. fib with RVR with heart rate 132 and chest x-ray was notable for moderate vascular congestion though an infiltrate could not be excluded in the left lower base. She was transitioned off BiPAP and given vancomycin, cefepime, 500 cc fluid bolus.  Meds:  Current Meds  Medication Sig  . Ascorbic Acid (VITAMIN C) 1000 MG tablet Take 1,000 mg by mouth daily at 12 noon.   Marland Kitchen aspirin 81 MG tablet Take 81 mg by mouth every morning.   Marland Kitchen aspirin-acetaminophen-caffeine (EXCEDRIN MIGRAINE) 250-250-65 MG tablet Take 1 tablet by mouth 2 (two) times daily.  Marland Kitchen atorvastatin (LIPITOR) 40 MG tablet Take 1 tablet (40 mg total) by mouth daily. (Patient taking differently: Take 40  mg by mouth every morning. )  . BREO ELLIPTA 100-25 MCG/INH AEPB Inhale 1 puff into the lungs daily.  . calcium carbonate (OS-CAL - DOSED IN MG OF ELEMENTAL CALCIUM) 1250 (500 Ca) MG tablet Take 1 tablet by mouth daily with breakfast.  . clopidogrel (PLAVIX) 75 MG tablet Take 1 tablet (75 mg total) by mouth at bedtime.  Marland Kitchen escitalopram (LEXAPRO) 20 MG  tablet Take 1 tablet (20 mg total) by mouth daily. (Patient taking differently: Take 20 mg by mouth every morning. )  . HYDROcodone-acetaminophen (NORCO/VICODIN) 5-325 MG tablet Take 1 tablet by mouth every 8 (eight) hours as needed for moderate pain.  Marland Kitchen LORazepam (ATIVAN) 0.5 MG tablet Take 1 tablet (0.5 mg total) by mouth at bedtime as needed for sleep.  Marland Kitchen LUMIGAN 0.01 % SOLN Apply 1 drop in both eyes every night  . Macitentan (OPSUMIT) 10 MG TABS Take 10 mg by mouth daily.  . metoprolol succinate (TOPROL-XL) 50 MG 24 hr tablet Take 1 tablet (50 mg total) by mouth daily. with or immediately following a meal.  . OXYGEN Inhale 2-3 L into the lungs daily. Oxygen 2lpm with rest and 3 lpm with exertion  . pantoprazole (PROTONIX) 40 MG tablet Take 40 mg by mouth every morning.      Allergies: Allergies as of 06/19/2016  . (No Known Allergies)   Past Medical History:  Diagnosis Date  . Anemia   . Anxiety   . Arthritis   . Blood transfusion without reported diagnosis   . Cataract    BILATERAL  . Collagenous colitis   . Crohn's disease ([place])   . Depression   . Duodenal stricture   . Duodenal ulcer   . Esophageal reflux   . Heart disease   . Heart murmur   . Hiatal hernia   . Hyperlipemia   . Hypertension   . Osteoporosis   . Paroxysmal a-fib ([place]) 07/09/2011  . Peripheral vascular disease ([place])   . Pneumonia     Family History: Her father passed away at the age of 74, presumably from long-standing history of heart disease which was present in multiple members of his side of the family. Her mother passed away at 60 and was in good health. Her brother passed away at age 13 but "did not take care of himself." Her sister at 66 years of age and is in good health.  Social History: She worked as a Marine scientist for 40 years. All other information as noted in the history of present illness.  Review of Systems: She denied changes in appetite, abdominal pain, nausea, vomiting, diarrhea,  constipation recent falls.   Physical Exam: Blood pressure 126/70, pulse 98, temperature 98.4 F (36.9 C), temperature source Oral, resp. rate 23, SpO2 (!) 88 %. Physical Exam  Constitutional: She is oriented to person, place, and time. No distress.  Chronically ill-appearing, elderly female  HENT:  Head: Normocephalic and atraumatic.  Eyes: Conjunctivae are normal. No scleral icterus.  Neck: JVD (Mild) present.  Cardiovascular: Intact distal pulses.   Irregular rate  Pulmonary/Chest:  Prior surgical incision noted along the sternum. Diminished breath sounds all throughout the posterior lung fields.  Abdominal: Soft. Bowel sounds are normal. She exhibits no distension. There is no tenderness. There is no rebound and no guarding.  Musculoskeletal: She exhibits no edema.  Neurological: She is alert and oriented to person, place, and time.  Skin: Skin is warm and dry. She is not diaphoretic.   EKG: Reviewed and compared with  12/08/15. Atrial fibrillation with RVR Borderline right axis deviation  CXR: Patchy infiltrates noted all throughout suggestive of vascular congestion with a possible left pleural effusion.  Assessment & Plan by Problem: Principal Problem:   Acute on chronic respiratory failure with hypoxia (HCC) Active Problems:   Coronary atherosclerosis   Peripheral vascular disease (HCC)   G E R D   Paroxysmal a-fib (HCC)   Depression   Pulmonary hypertension   Centrilobular emphysema (HCC)   Pulmonary scarring  Ms. 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  Acute on chronic respiratory failure with hypoxia: Suspect it may be related to atrial fibrillation with RVR though trigger unclear. Pulmonary etiology possible given her underlying disease. Risk factors for PE include tachycardia and sedentary lifestyle for which her Wells score would be 3 and consistent with moderate risk. She does have a prior history of coronary artery disease which may suggest ACS though she denies anginal symptoms.  Infection, toxins, neurologic causes appear unlikely.  -Monitor on telemetry -Check pro calcitonin to assess for bacterial vs viral infiltrate     -Check CTA to r/o PE. Start NS @ 75cc/hr x 12 hours. -Continue Breo 1 puff daily  -Wean to home oxygen as tolerated with goal O2 sat 80-92% -Consult Pulmonology for management of her pulmonary artery hypertension  Paroxysmal atrial fibrillation not on chronic anticoagulation: CHADSVASC score 5 [age, CHF, female, PVD, HTN] which is consistent with a 9.7% stroke/TIA/embolism.  -Consider reviewing risks and benefits of chronic anticoagulation prior to discharge -Holding metoprolol succinate 50 mg given hypotension. HR mostly 80s-90s for now.  Abnormal LFTs: LFTs on admission notable for elevated alkaline phosphatase 203, AST 128, ALT 64, total bilirubin 1.3. In the absence of GI symptoms, I wonder if this is consistent with congestive hepatopathy which would be associated with underlying pulmonary artery hypertension. -Repeat LFTs and holding endothelin receptor antagonist pending Pulmonary recommendations  Coronary artery disease status post CABG 2005: Continue clopidogrel 61m, atorvastatin 40 mg daily.  Peripheral vascular disease: Clopidogrel and atorvastatin as noted above.  Depression/anxiety: Continue citalopram 20 mg, lorazepam 0.5 mg at bedtime as needed for sleep.  GERD: Continue pantoprazole 40 mg daily though consider discontinuing or switching to H2 blocker given osteoporosis noted on DEXA,  November 2016.  Chronic pain: Continue hydrocodone/acetaminophen 5/325 mg to be taken every 8 hours as needed for chronic pain and MiraLax for constipation prophylaxis.  Code Status: PARTIAL CODE [BiPAP only] -Defer to son BMiliana Gangwer[[[phone]]if patients lacks decision-making capacity -Confirmed with patient on admission  Dispo: Admit patient to Inpatient with expected length of stay greater than 2 midnights.  Signed: RRiccardo Dubin MD 06/19/2016, 7:57 AM  Pager: [phone]

## 2016-06-19 NOTE — Progress Notes (Signed)
Per EMS, pt has hx of COPD and pulmonary hypertension. Pt transported from home by EMS for SOB/dyspnea. Pt SATs in the 70-80 in 4L o2. Pt is o2 dependant at home. Per EMS pt has had 171m Solumedrol, two duoneb and one additional ventolin neb inline with the NIV machine. Pt is started on NIV settings are in the flowsheet. [facility] No active distress noted at this time.

## 2016-06-20 ENCOUNTER — Inpatient Hospital Stay (HOSPITAL_COMMUNITY): Payer: Medicare Other

## 2016-06-20 DIAGNOSIS — B9620 Unspecified Escherichia coli [E. coli] as the cause of diseases classified elsewhere: Secondary | ICD-10-CM

## 2016-06-20 DIAGNOSIS — R7881 Bacteremia: Secondary | ICD-10-CM

## 2016-06-20 DIAGNOSIS — A4151 Sepsis due to Escherichia coli [E. coli]: Secondary | ICD-10-CM

## 2016-06-20 DIAGNOSIS — I480 Paroxysmal atrial fibrillation: Secondary | ICD-10-CM

## 2016-06-20 DIAGNOSIS — J189 Pneumonia, unspecified organism: Secondary | ICD-10-CM

## 2016-06-20 DIAGNOSIS — I2721 Secondary pulmonary arterial hypertension: Secondary | ICD-10-CM

## 2016-06-20 DIAGNOSIS — Z419 Encounter for procedure for purposes other than remedying health state, unspecified: Secondary | ICD-10-CM

## 2016-06-20 DIAGNOSIS — N390 Urinary tract infection, site not specified: Secondary | ICD-10-CM

## 2016-06-20 DIAGNOSIS — J181 Lobar pneumonia, unspecified organism: Secondary | ICD-10-CM

## 2016-06-20 DIAGNOSIS — L8990 Pressure ulcer of unspecified site, unspecified stage: Secondary | ICD-10-CM | POA: Insufficient documentation

## 2016-06-20 LAB — CBC
HCT: 31 % — ABNORMAL LOW (ref 36.0–46.0)
HEMATOCRIT: 29.6 % — AB (ref 36.0–46.0)
Hemoglobin: 9.1 g/dL — ABNORMAL LOW (ref 12.0–15.0)
Hemoglobin: 9.8 g/dL — ABNORMAL LOW (ref 12.0–15.0)
MCH: 29.4 pg (ref 26.0–34.0)
MCH: 30.3 pg (ref 26.0–34.0)
MCHC: 30.7 g/dL (ref 30.0–36.0)
MCHC: 31.6 g/dL (ref 30.0–36.0)
MCV: 95.8 fL (ref 78.0–100.0)
MCV: 96 fL (ref 78.0–100.0)
PLATELETS: 166 10*3/uL (ref 150–400)
Platelets: 130 10*3/uL — ABNORMAL LOW (ref 150–400)
RBC: 3.09 MIL/uL — AB (ref 3.87–5.11)
RBC: 3.23 MIL/uL — ABNORMAL LOW (ref 3.87–5.11)
RDW: 15.2 % (ref 11.5–15.5)
RDW: 15.2 % (ref 11.5–15.5)
WBC: 13 10*3/uL — AB (ref 4.0–10.5)
WBC: 17.1 10*3/uL — ABNORMAL HIGH (ref 4.0–10.5)

## 2016-06-20 LAB — RESPIRATORY PANEL BY PCR
Adenovirus: NOT DETECTED
BORDETELLA PERTUSSIS-RVPCR: NOT DETECTED
CORONAVIRUS 229E-RVPPCR: NOT DETECTED
CORONAVIRUS HKU1-RVPPCR: NOT DETECTED
CORONAVIRUS NL63-RVPPCR: NOT DETECTED
Chlamydophila pneumoniae: NOT DETECTED
Coronavirus OC43: NOT DETECTED
Influenza A: NOT DETECTED
Influenza B: NOT DETECTED
METAPNEUMOVIRUS-RVPPCR: NOT DETECTED
Mycoplasma pneumoniae: NOT DETECTED
PARAINFLUENZA VIRUS 1-RVPPCR: NOT DETECTED
PARAINFLUENZA VIRUS 2-RVPPCR: NOT DETECTED
Parainfluenza Virus 3: NOT DETECTED
Parainfluenza Virus 4: NOT DETECTED
Respiratory Syncytial Virus: NOT DETECTED
Rhinovirus / Enterovirus: NOT DETECTED

## 2016-06-20 LAB — BASIC METABOLIC PANEL
Anion gap: 4 — ABNORMAL LOW (ref 5–15)
BUN: 15 mg/dL (ref 6–20)
CHLORIDE: 104 mmol/L (ref 101–111)
CO2: 31 mmol/L (ref 22–32)
Calcium: 8.3 mg/dL — ABNORMAL LOW (ref 8.9–10.3)
Creatinine, Ser: 0.83 mg/dL (ref 0.44–1.00)
GFR calc Af Amer: >60 mL/min (ref >60)
GFR calc non Af Amer: >60 mL/min (ref >60)
Glucose, Bld: 107 mg/dL — ABNORMAL HIGH (ref 65–99)
POTASSIUM: 4.2 mmol/L (ref 3.5–5.1)
SODIUM: 139 mmol/L (ref 135–145)

## 2016-06-20 MED ORDER — AZITHROMYCIN 250 MG PO TABS: 250.0000 mg | ORAL_TABLET | Freq: Every day | ORAL | 0 refills | Status: AC

## 2016-06-20 MED ORDER — METOPROLOL SUCCINATE ER 50 MG PO TB24
50.0000 mg | ORAL_TABLET | Freq: Every day | ORAL | Status: DC
  Administered 2016-06-20 – 2016-06-22 (×2): 50 mg via ORAL
  Filled 2016-06-20 (×3): qty 1

## 2016-06-20 MED ORDER — ADULT MULTIVITAMIN W/MINERALS CH
1.0000 | ORAL_TABLET | Freq: Every day | ORAL | Status: DC
  Administered 2016-06-21 – 2016-06-22 (×2): 1 via ORAL
  Filled 2016-06-20 (×2): qty 1

## 2016-06-20 MED ORDER — ENSURE ENLIVE PO LIQD
237.0000 mL | Freq: Two times a day (BID) | ORAL | Status: DC
  Administered 2016-06-20 – 2016-06-22 (×3): 237 mL via ORAL

## 2016-06-20 MED ORDER — LOPERAMIDE HCL 2 MG PO CAPS
2.0000 mg | ORAL_CAPSULE | ORAL | Status: DC | PRN
  Administered 2016-06-20 – 2016-06-21 (×2): 2 mg via ORAL
  Filled 2016-06-20 (×3): qty 1

## 2016-06-20 MED ORDER — ENSURE ENLIVE PO LIQD: 237.0000 mL | Freq: Two times a day (BID) | ORAL | 12 refills | Status: AC

## 2016-06-20 MED ORDER — ADULT MULTIVITAMIN W/MINERALS CH: 1.0000 | ORAL_TABLET | Freq: Every day | ORAL | Status: AC

## 2016-06-20 MED ORDER — DEXTROSE 5 % IV SOLN
2.0000 g | INTRAVENOUS | Status: DC
  Administered 2016-06-20 – 2016-06-21 (×2): 2 g via INTRAVENOUS
  Filled 2016-06-20 (×3): qty 2

## 2016-06-20 MED ORDER — ONDANSETRON HCL 4 MG/2ML IJ SOLN
4.0000 mg | Freq: Three times a day (TID) | INTRAMUSCULAR | Status: DC | PRN
  Administered 2016-06-20: 4 mg via INTRAVENOUS
  Filled 2016-06-20: qty 2

## 2016-06-20 MED ORDER — CEFDINIR 300 MG PO CAPS: 300.0000 mg | ORAL_CAPSULE | Freq: Two times a day (BID) | ORAL | 0 refills | Status: DC

## 2016-06-20 MED ORDER — ONDANSETRON HCL 4 MG PO TABS: 4.0000 mg | ORAL_TABLET | Freq: Three times a day (TID) | ORAL | Status: DC | PRN

## 2016-06-20 NOTE — Progress Notes (Signed)
  Date: 06/20/2016  Patient name: Cristina Parker  Medical record number: [id number]  Date of birth: September 10, 1935   I have seen and evaluated Cristina Parker and discussed their care with the Residency Team. In brief, patient is a 81 y/o female with PMH of moderate pulm HTN, CAD s/p CABG, pAfib not on a/c who presented with worsening SOB * 1 day  Patient noted worsening SOB on the night prior to admission which progressively worsened and was associated with wheezing and did not improve with rest. Patient has chronic baseline SOB and wears 3-4 L oxygen at home which worsens with ADLs.She was found to be in afib with RVR in the ED and had a CXR notable for moderate vascular congestion and possible PNA.  Patient spontaneously converted to NSR while in ED with subsequent improvement in her SOB. Today, she fees well with no new complaints  PMHx, Fam Hx, and/or Soc Hx : as per resident admit note  Vitals:   06/20/16 1409 06/20/16 1430  BP:    Pulse: 86 87  Resp:    Temp:     Gen: AAO*3, NAD CVS: RRR, normal heart sounds Lungs: b/l expiratory wheeze + Abd: soft, non tender, BS + Ext: no edema  Assessment and Plan: I have seen and evaluated the patient as outlined above. I agree with the formulated Assessment and Plan as detailed in the residents' note, with the following changes:   1. Hypoxic respiratory failure: - Patient presented with progressively worsening SOB and was found to have worsening hypoxia with increased oxygen requirements in the ED likely secondary to vascular congestion noted on CXR. Patient at the time was found to be in recurrent afib with rvr which could explain her worsening congestion. She also has some chronic hypoxia requiring home O2 secondary to pulm HTN and scarring of her lungs. - Patient spontaneously converted to NSR and her dyspnea has now resolved and she has O2 sats in the late 90s on home O2 - c/w metoprolol for now - c/w macitentan, budesonide and brovana for  her chronic lung disease  2. Sepsis: - Patient noted to have an elevated procalcitonin with a LA which peaked at 4.6 and worsening leukocytosis up to 17 with a CTA showing possible L lower lobe PNA - c/w ceftriaxone and azithromycin for now but given her positive blood cx with 2/2 E. Coli would suspect that the etiology for her sepsis is likely an underlying UTI. - Will f/u u/a and urine cx. Lactic acidosis has now resolved - Patient is already on ceftriaxone which should cover her E. Coli.  - Would monitor patient closely. Repeat procalcitonin in AM   Aldine Contes, MD 1/3/20188:35 PM

## 2016-06-20 NOTE — Progress Notes (Addendum)
Subjective: Overnight she had pulse ox readings in low 80s without complaints of dyspnea.  Pulse ox was changed to earlobe probe and readings improved to high 80s.  [place] O2 was increased to 7L and saturations improved to low 90s.    This morning, she is breathing and saturating well on 3-4L [place] with no complaints, feels at her baseline.  Objective:  Vital signs in last 24 hours: Vitals:   06/20/16 0450 06/20/16 0453 06/20/16 0459 06/20/16 0730  BP: (!) 107/91 (!) 107/91  121/86  Pulse: (!) 149  90 96  Resp: 17  (!) 21 (!) 33  Temp: 97.2 F (36.2 C)   98.7 F (37.1 C)  TempSrc: Oral   Oral  SpO2: 96%  98% 95%  Weight: 124 lb 14.4 oz (56.7 kg)     Height: _0  (1.499 m)      Physical Exam  Constitutional: She is oriented to person, place, and time. She appears well-developed and well-nourished. No distress.  Cardiovascular: Normal rate and regular rhythm.   Pulmonary/Chest:  No respiratory distress Good air movement Diffuse wheezes No crackles  Abdominal: Soft. She exhibits no distension. There is no tenderness.  Musculoskeletal: She exhibits no edema or tenderness.  Neurological: She is alert and oriented to person, place, and time.  Psychiatric: She has a normal mood and affect. Her behavior is normal.   CBC Latest Ref Rng & Units 06/20/2016 06/19/2016 06/19/2016  WBC 4.0 - 10.5 K/uL 13.0(H) - 9.6  Hemoglobin 12.0 - 15.0 g/dL 9.1(L) 13.6 12.0  Hematocrit 36.0 - 46.0 % 29.6(L) 40.0 38.8  Platelets 150 - 400 K/uL 130(L) - 191   BMP Latest Ref Rng & Units 06/20/2016 06/19/2016 06/19/2016  Glucose 65 - 99 mg/dL 107(H) 157(H) 154(H)  BUN 6 - 20 mg/dL _1 Creatinine 0.44 - 1.00 mg/dL 0.83 0.80 0.90  Sodium 135 - 145 mmol/L 139 139 137  Potassium 3.5 - 5.1 mmol/L 4.2 4.5 4.7  Chloride 101 - 111 mmol/L 104 99(L) 98(L)  CO2 22 - 32 mmol/L 31 - 28  Calcium 8.9 - 10.3 mg/dL 8.3(L) - 9.0   Chest Radiograph Portable AP (ordered PA and lateral) 06/20/2016 IMPRESSION: Persistent CHF  with interstitial edema and small bilateral pleural effusions. Atelectasis or interstitial infiltrate in the right upper lobe. Improved atelectasis or pneumonia at the left lung base.  Assessment/Plan:  Principal Problem:   Acute on chronic respiratory failure with hypoxia (HCC) Active Problems:   Coronary atherosclerosis   Peripheral vascular disease (HCC)   G E R D   Paroxysmal a-fib (HCC)   Depression   Pulmonary hypertension   Centrilobular emphysema (HCC)   Pulmonary scarring   CAP (community acquired pneumonia)   Pressure injury of skin  81 year old woman with complicated cardiopulmonary history presenting with worsening of her baseline dyspnea in AFib with RVR.   She also has a leukocytosis, abnormal CXR, and elevated procalcitonin, and PNA may be contributing and could have also precipitated her AFib.  She spontaneously reverted to NSR and her breathing returned to baseline.  #Dyspnea #Paroxysmal AFib Not on anticoagulation, CHADSVASC score 5. -Restart home metoprolol succ 50 mg daily -Discuss risks/benefits of anticoagulation and recommend outpatient follow-up  #CAP Now at baseline.  In NSR. -Continue CAP treatment for 5 days with cefdinir and azithromycin -F/u BCx, strep pneumo, legionella  #Chronic Hypoxic Respirtory Failure #PAH Maintaining O2 on home 3-4L [place]. -Continue supplemental O2 titrate 90-92%. -Budesonide and brovana nebs -Continue home  amcitentan   Dispo: Anticipated discharge in approximately 0-1 day(s).   Minus Liberty, MD 06/20/2016, 8:46 AM Pager: [phone]

## 2016-06-20 NOTE — Evaluation (Signed)
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  Clinical Impression  Pt admitted with above diagnosis. Pt currently with functional limitations due to the deficits listed below (see PT Problem List). Min assist for stand pivot transfer x 2, SaO2 84% on 5L O2 with activity. Ambulation deferred 2* hypoxia/fatigue with transfers. Pt will benefit from skilled PT to increase their independence and safety with mobility to allow discharge to the venue listed below.       Follow Up Recommendations Home health PT    Equipment Recommendations  None recommended by PT    Recommendations for Other Services       Precautions / Restrictions Precautions Precautions: Fall;Other (comment) Precaution Comments: 1 fall in past year, monitor O2 Restrictions Weight Bearing Restrictions: No      Mobility  Bed Mobility Overal bed mobility: Modified Independent             General bed mobility comments: with rail  Transfers Overall transfer level: Needs assistance   Transfers: Sit to/from Stand;Stand Pivot Transfers Sit to Stand: Min assist Stand pivot transfers: Min assist       General transfer comment: min A (pt 90%) to rise from low bed and pivot to [facility] then to recliner; SaO2 84% on 5L O2 with activity, HR 111 with activity. SaO2 94% on 5L O2 resting, HR 88 resting  Ambulation/Gait             General Gait Details: deferred 2* fatigue/hypoxia with stand pivot  transfers  Stairs            Wheelchair Mobility    Modified Rankin (Stroke Patients Only)       Balance Overall balance assessment: Needs assistance   Sitting balance-Leahy Scale: Good       Standing balance-Leahy Scale: Fair                               Pertinent Vitals/Pain Pain Assessment: No/denies pain    Home Living Family/patient expects to be discharged to:: Private residence Living Arrangements: Children;Other relatives Available Help at Discharge: Family;Available PRN/intermittently Type of Home: House Home Access: Stairs to enter Entrance Stairs-Rails: Right Entrance Stairs-Number of Steps: 3 Home Layout: Able to live on main level with bedroom/bathroom Home Equipment: [place] - manual;Walker - 4 wheels;Shower seat Additional Comments: uses 4L O2 at home    Prior Function Level of Independence: Independent         Comments: sponge bathes, uses rollator and 4L O2 at baseline     Hand Dominance        Extremity/Trunk Assessment   Upper Extremity Assessment Upper Extremity Assessment: Overall WFL for tasks assessed    Lower Extremity Assessment Lower Extremity Assessment: Overall WFL for tasks assessed    Cervical / Trunk Assessment Cervical / Trunk Assessment: Kyphotic  Communication   Communication: No difficulties  Cognition Arousal/Alertness: Awake/alert Behavior During Therapy: WFL for tasks assessed/performed Overall Cognitive Status: Within Functional Limits for tasks assessed  General Comments      Exercises     Assessment/Plan    PT Assessment Patient needs continued PT services  PT Problem List Decreased mobility;Decreased activity tolerance          PT Treatment Interventions Gait training;Functional mobility training;Therapeutic exercise;Balance training;Therapeutic activities;Patient/family education    PT Goals (Current goals can be found in the Care  Plan section)  Acute Rehab PT Goals Patient Stated Goal: to be able to do my ADLs PT Goal Formulation: With patient Time For Goal Achievement: 07/04/16 Potential to Achieve Goals: Good    Frequency Min 3X/week   Barriers to discharge        Co-evaluation               End of Session Equipment Utilized During Treatment: Gait belt;Oxygen Activity Tolerance: Patient limited by fatigue   Nurse Communication: Mobility status         Time: 0076-2263 PT Time Calculation (min) (ACUTE ONLY): 34 min   Charges:   PT Evaluation $PT Eval Low Complexity: 1 Procedure PT Treatments $Therapeutic Activity: 8-22 mins   PT G Codes:        Philomena Doheny 06/20/2016, 1:41 PM [phone]

## 2016-06-20 NOTE — Evaluation (Signed)
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   Clinical Impression   Pt currently presents at a min guard assist level for selfcare tasks and functional transfers at this time.  Decreased endurance with O2 sats decreasing to the upper 80's on 4Ls nasal cannula with activity.  Feel she will benefit from acute care OT to help increase endurance and educated on energy conservation strategies for selfcare tasks.  Pt will have supervision from family at discharge and declines HHOT at discharge.  Will continue to follow in acute care.      Follow Up Recommendations  No OT follow up (Pt declines HHOT)    Equipment Recommendations  None recommended by OT       Precautions / Restrictions Precautions Precautions: Fall;Other (comment) Precaution Comments: 1 fall in past year, monitor O2 Restrictions Weight Bearing Restrictions: No      Mobility Bed Mobility Overal bed mobility: Modified Independent             General bed mobility comments: with rail  Transfers Overall transfer level: Needs assistance Equipment used: 1 person hand held assist Transfers: Sit to/from Stand;Stand Pivot Transfers Sit to Stand: Min guard Stand pivot transfers: Min guard       General transfer comment: min A (pt 90%) to rise from low bed and pivot to [facility] then to recliner; SaO2 84% on 5L O2 with activity, HR 111 with activity. SaO2 94% on 5L O2 resting, HR 88 resting     Balance Overall balance assessment: Needs assistance   Sitting balance-Leahy Scale: Good     Standing balance support: No upper extremity supported;During functional activity Standing balance-Leahy Scale: Fair                              ADL Overall ADL's : Needs assistance/impaired Eating/Feeding: Independent;Sitting   Grooming: Wash/dry hands;Wash/dry face;Set up;Sitting   Upper Body Bathing: Supervision/ safety;Sitting   Lower Body Bathing: Min guard;Sit to/from stand   Upper Body Dressing : Supervision/safety;Sitting   Lower Body Dressing: Minimal assistance;Sit to/from stand   Toilet Transfer: Tour manager Toilet;Grab bars   Toileting- Water quality scientist and Hygiene: Min guard;Sit to/from stand       Functional mobility during ADLs: Min guard General ADL Comments: Pt with limitations in endurance on 4Ls O2.  Oxygen sats decreased to 86% with ambulation to the toilet and back to the bedside recliner.  Has toaster oven, microwave, and small refrigerator in her room at home she uses frequently, otherwise her son fixes or brings her something to eat.       Vision Vision Assessment?: No apparent visual deficits   Perception Perception Perception Tested?: No   Praxis Praxis Praxis tested?: Within functional limits    Pertinent Vitals/Pain Pain Assessment: Faces Faces Pain Scale: Hurts a little bit Pain Location: back Pain Descriptors / Indicators: Discomfort Pain Intervention(s): Limited activity within patient's tolerance;Monitored during session     Hand Dominance Right   Extremity/Trunk Assessment Upper Extremity Assessment Upper Extremity Assessment: RUE deficits/detail RUE Deficits /  Details: Pt with history of right shoulder fracture demonstrates limited shoulder flexion 0-110 degrees as well as limitations in internal and external rotation when attempting to reach behind her head and her back.  Elbow strength 4/5 as well as grip  strength.     Lower Extremity Assessment Lower Extremity Assessment: Defer to PT evaluation   Cervical / Trunk Assessment Cervical / Trunk Assessment: Kyphotic   Communication Communication Communication: No difficulties   Cognition Arousal/Alertness: Awake/alert Behavior During Therapy: WFL for tasks assessed/performed Overall Cognitive Status: Within Functional Limits for tasks assessed                                Home Living Family/patient expects to be discharged to:: Private residence Living Arrangements: Children;Other relatives Available Help at Discharge: Family;Available PRN/intermittently Type of Home: House Home Access: Stairs to enter [employer] of Steps: 3 Entrance Stairs-Rails: Right Home Layout: Able to live on main level with bedroom/bathroom     Bathroom Shower/Tub: Walk-in shower (sponge bathe)   Bathroom Toilet: Standard     Home Equipment: [place] - 4 wheels;Shower seat   Additional Comments: uses 4L O2 at home      Prior Functioning/Environment Level of Independence: Independent        Comments: , uses rollator and 4L O2 at baseline  Furniture walked as well.          OT Problem List: Decreased strength;Decreased activity tolerance;Impaired balance (sitting and/or standing)   OT Treatment/Interventions: Self-care/ADL training;DME and/or AE instruction;Patient/family education;Balance training;Therapeutic activities    OT Goals(Current goals can be found in the care plan section) Acute Rehab OT Goals Patient Stated Goal: Pt wants to get her strength back. OT Goal Formulation: With patient Time For Goal Achievement: 07/04/16 Potential to Achieve Goals: Good  OT Frequency: Min 2X/week              End of Session Equipment Utilized During Treatment: Oxygen Nurse Communication: Mobility status  Activity Tolerance: Patient limited by fatigue Patient left: in chair;with call  bell/phone within reach   Time: 1324-4010 OT Time Calculation (min): 31 min Charges:  OT General Charges $OT Visit: 1 Procedure OT Evaluation $OT Eval Moderate Complexity: 1 Procedure OT Treatments $Self Care/Home Management : 8-22 mins  Noreen Mackintosh OTR/L 06/20/2016, 2:45 PM

## 2016-06-20 NOTE — Progress Notes (Signed)
PCCM Consult Note  Admission date: 06/19/2016 Consult date: 06/19/2016 Referring provider: Dr. Posey Pronto, IMTS  CC: Short of breath  HPI: 81 yo female presented with one day of increased cough, dyspnea.  She feels congested in her chest, but hasn't been able to bring up sputum.  She also had chills, but is not sure if she had fever.  She has been been getting sinus congestion, and noticed a sore throat also.  She is followed by Dr. Chase Caller for COPD and chronic hypoxic respiratory failure.  She is followed by Dr. Marigene Ehlers for pulmonary hypertension.  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  She denies recent sick exposures.  She denies chest pain, abdominal pain, nausea, diarrhea, swelling, or skin rashes.  In the ER she was noted to have low blood pressure.  She was given IV fluids.  Lactic acid and procalcitonin were elevated.  She had CT chest which showed infiltrate.  She was started on antibiotics.  She required bipap transiently for hypoxia, but has transitioned off this.  She is DNR/DNI.   Subjective: Breathing better per patient  Vital signs: BP 121/86 (BP Location: Right Arm)   Pulse 96   Temp 98.7 F (37.1 C) (Oral)   Resp (!) 33   Ht _0  (1.499 m)   Wt 124 lb 14.4 oz (56.7 kg)   SpO2 95%   BMI 25.23 kg/m   Intake/output: I/O last 3 completed shifts: In: 320 [P.O.:120; IV Piggyback:200] Out: -   General: pleasant, thin, NAD at rest. sats 86%, just went to BR on 6 l [place], feels better Neuro: alert, normal strength HEENT: no stridor, no JVD Cardiac: regular, no murmur Chest: bibasilar wheeze on expiration, decreased air movement Abd: soft, non tender, + bowel sounds Ext: no edema Skin: no rashes   CMP Latest Ref Rng & Units 06/20/2016 06/19/2016 06/19/2016  Glucose 65 - 99 mg/dL 107(H) - 157(H)  BUN 6 - 20 mg/dL 15 - 20  Creatinine 0.44 - 1.00 mg/dL 0.83 - 0.80  Sodium 135 - 145 mmol/L 139 - 139  Potassium 3.5 - 5.1 mmol/L 4.2 - 4.5  Chloride 101 - 111  mmol/L 104 - 99(L)  CO2 22 - 32 mmol/L 31 - -  Calcium 8.9 - 10.3 mg/dL 8.3(L) - -  Total Protein 6.5 - 8.1 g/dL - 5.4(L) -  Total Bilirubin 0.3 - 1.2 mg/dL - 0.6 -  Alkaline Phos 38 - 126 U/L - 138(H) -  AST 15 - 41 U/L - 74(H) -  ALT 14 - 54 U/L - 48 -     CBC Latest Ref Rng & Units 06/20/2016 06/19/2016 06/19/2016  WBC 4.0 - 10.5 K/uL 13.0(H) - 9.6  Hemoglobin 12.0 - 15.0 g/dL 9.1(L) 13.6 12.0  Hematocrit 36.0 - 46.0 % 29.6(L) 40.0 38.8  Platelets 150 - 400 K/uL 130(L) - 191     ABG    Component Value Date/Time   HCO3 30.6 (H) 12/08/2015 1023   HCO3 28.7 (H) 12/08/2015 1023   TCO2 37 06/19/2016 0552   O2SAT 57.0 12/08/2015 1023   O2SAT 51.0 12/08/2015 1023     CBG (last 3)  No results for input(s): GLUCAP in the last 72 hours.   Imaging: Ct Angio Chest Pe W Or Wo Contrast  Result Date: 06/19/2016 CLINICAL DATA:  Shortness of Breath EXAM: CT ANGIOGRAPHY CHEST WITH CONTRAST TECHNIQUE: Multidetector CT imaging of the chest was performed using the standard protocol during bolus administration of intravenous contrast.  Multiplanar CT image reconstructions and MIPs were obtained to evaluate the vascular anatomy. CONTRAST:  100 mL Isovue 370 nonionic COMPARISON:  Chest CT September 22, 2015 and chest radiograph June 19, 2016 FINDINGS: Cardiovascular: There is no focal pulmonary embolus. There is no appreciable thoracic aortic aneurysm or dissection. There is extensive calcification throughout the proximal visualized great vessels. There is extensive atherosclerotic calcification in the aorta. There is extensive coronary artery calcification. The pericardium is not appreciably thickened. There is mild generalized cardiomegaly. Mediastinum/Nodes: Thyroid appears unremarkable. There are multiple small mediastinal lymph nodes. There is a sub- carinal lymph node measuring 1.7 x 1.3 cm, mildly prominent. There is a right hilar lymph node measuring 1.2 x 1.0 cm, borderline prominent. There is a small  hiatal hernia. There is esophageal thickening throughout much of the esophagus. Scattered foci of esophageal air noted throughout the esophagus. Lungs/Pleura: There are bilateral pleural effusions, larger on the left than on the right. There is consolidation in the posterior left base. There is atelectatic change in the right base. There is interstitial pulmonary edema in the bases. Fluid tracks along the right major and minor fissures. Upper Abdomen: In the visualized upper abdomen, there is atherosclerotic calcification in the aorta. The liver has a somewhat nodular contour, raising concern for a degree of underlying cirrhosis. A small adenoma in the right adrenal gland is stable measuring 1.3 x 1.0 cm. A similar adenoma is noted in the left adrenal measuring 1.0 x 0.9 cm. There is a granuloma in the left lobe of the liver. Musculoskeletal: There is degenerative change throughout the thoracic spine. There are multiple anterior wedge compression fractures, stable. No blastic or lytic lesions evident. There is old trauma involving the right proximal humerus with remodeling and advanced avascular necrosis in the right humeral head. There is postoperative change in the lower cervical spine. Review of the MIP images confirms the above findings. IMPRESSION: No demonstrable pulmonary embolus. Extensive atherosclerotic calcification. Findings felt to represent a degree of congestive heart failure. Consolidation in the left lower lobe posteriorly, likely primarily due to atelectasis, although a degree of superimposed pneumonia must be questioned. Mildly prominent right hilar and sub- carinal lymph nodes may well be of reactive etiology. Small hiatal hernia with thickening in the esophagus, likely due to chronic reflux esophagitis. Small benign adrenal adenomas. Calcified granuloma in the liver. The current with the liver raises question of a degree of underlying cirrhosis. Advanced avascular necrosis and remodeling right  humeral head. Multiple stable compression fractures in the thoracic spine. Electronically Signed   By: Lowella Grip III M.D.   On: 06/19/2016 12:23   Dg Chest Port 1 View  Result Date: 06/20/2016 CLINICAL DATA:  Community-acquired pneumonia. History of coronary artery disease and CABG, peripheral vascular disease, acute on chronic respiratory failure, former smoker. EXAM: PORTABLE CHEST 1 VIEW COMPARISON:  CT scan of the chest of June 19, 2016 and chest x-ray of the same day FINDINGS: The lungs are adequately inflated. The interstitial markings are less conspicuous today. There is patchy interstitial density in the right upper lobe which is stable. There is persistent blunting of the costophrenic angles. The cardiac silhouette is enlarged. The pulmonary vascularity is engorged but more distinct. The sternal wires are intact. IMPRESSION: Persistent CHF with interstitial edema and small bilateral pleural effusions. Atelectasis or interstitial infiltrate in the right upper lobe. Improved atelectasis or pneumonia at the left lung base. Electronically Signed   By: David  Martinique M.D.   On: 06/20/2016 07:46  Dg Chest Port 1 View  Result Date: 06/19/2016 CLINICAL DATA:  Worsening dyspnea and cough for 1 day. EXAM: PORTABLE CHEST 1 VIEW COMPARISON:  04/07/2015 FINDINGS: Moderate vascular and interstitial prominence. Moderate cardiomegaly. Patchy airspace opacities in the central and basilar regions bilaterally, worse on the left. Probable left pleural effusion. IMPRESSION: The findings may represent congestive heart failure. Cannot exclude infectious infiltrate in the left base. Electronically Signed   By: Andreas Newport M.D.   On: 06/19/2016 05:52   Dg Chest 1 View  Result Date: 06/19/2016 CLINICAL DATA:  Shortness of breath and dyspnea. EXAM: CHEST 1 VIEW COMPARISON:  AP portable from earlier today. FINDINGS: Single lateral view of the chest shows posterior basilar atelectasis or pneumonia. Probable  small left pleural effusion. Diffuse interstitial opacity again noted. Bones are diffusely demineralized with compression deformity lower thoracic spine stable since CT scan of 09/22/2015. IMPRESSION: Pulmonary edema with posterior basilar atelectasis or infiltrate and small pleural effusion. Electronically Signed   By: Misty Stanley M.D.   On: 06/19/2016 09:40     Studies: Serology 09/15/15 >> ACE 29, RF < 10, anti CCP < 16, SSA/SSB < 1, SCL 70 < 1, RNP < 1, Aldolase 5.4, HP panel negative Echo 11/03/15 >> EF 50 to 17%, grade 2 diastolic DD, mild MR, PAS 69 mmHg PFT 11/10/15 >> FEV1 1.00 (64%), FEV1% 82, TLC 4.18 (97%), DLCO 25% RHC 12/08/15 >> RA 10, RV 70/15, PA 66/19/41, PCWP 16, CI (Fick) 2.2, PVR 7.6 WU CT angio chest 06/20/15 >> coronary calcification, borderline LAN up to 1.7 cm, small HH, esophageal thickening, b/l pleural effusions Lt > Rt, Lt lower lung consolidation, interstitial edema  Antibiotics: Cefepime 1/02 >> 1/02 Vancomycin 1/02 >> 1/02 Rocephin 1/02 >> Zithromax 1/02 >>  Cultures: Blood 1/02 >>Ecoli>> Respiratory viral panel 1/02 >> Legionella Ag 1/02 >> Pneumococcal Ag 1/02 >>  Lines/tubes:  Events: 1/02 Admit  Summary: 81 yo female with CAP, sepsis, acute on chronic hypoxic respiratory failure.  She has hx of COPD with emphysema, diastolic CHF, pulmonary hypertension. 1/2 better with abx. O2 sats low on 6 l [place]  Assessment/plan:  Acute on chronic hypoxic respiratory failure. - oxygen to keep SpO2 90 to 95% - Bipap as needed  Sepsis from community acquired pneumonia with elevated lactic acid and procalcitonin. BC + Ecoli - continue IV fluid - lactic acid now 1.8, procal 6.00 -1/2 bc + E coli, follow culture data closely - check RSV, legionella Ag pneumococcal Ag - day 2 rocephin, zithromax  COPD with emphysema. - brovana, pulmicort, prn albuterol  WHO group 2 and 3 pulmonary hypertension. - continue macitentan  DVT prophylaxis - lovenox SUP -  protonix Nutrition - heart healthy diet Goals of care - DNR/DNI  Richardson Landry Minor ACNP Maryanna Shape PCCM Pager [phone] till 3 pm If no answer page [phone] 06/20/2016, 8:48 AM  Feels better.  Having loose stools.  Alert.  No wheeze.  Abd soft.  BC - E coli  Assessment/plan:  Sepsis with E coli bacteremia >> reports recent symptoms suggestive of UTI. CAP. - continue rocephin, zithromax  Diarrhea. - prn imodium  COPD. Hypoxia. - BDs - oxygen  PCCM will sign off.  Please call if further help needed while she is in hospital.  Chesley Mires, MD [place] 06/20/2016, 2:53 PM Pager:  [phone] After 3pm call: [phone]

## 2016-06-20 NOTE — Discharge Instructions (Addendum)
You were admitted to the hospital with shortness of breath which we think was from atrial fibrillation and also a pneumonia.  We also found that you had a bloodstream infection which may be from a urinary tract infection.  I have prescribed you 2 antibiotics to take for the next 3 days, starting tomorrow 1/4.  Please review your medication list.  Follow-up with your PCP next week.  If you have fevers, chills, shortness of breath, or chest pain, please return to the ED as these could be signs of an emergency.

## 2016-06-20 NOTE — Discharge Summary (Signed)
Name: Cristina Parker MRN: [id number] DOB: 10/09/1935 81 y.o. PCP: Shawnee Knapp, MD  Date of Admission: 06/19/2016  5:33 AM Date of Discharge: 06/22/2016 Attending Physician: Aldine Contes, MD  Discharge Diagnosis:  Principal Problem:   Bacteremia due to Escherichia coli Active Problems:   Coronary atherosclerosis   Peripheral vascular disease ([place])   G E R D   Paroxysmal a-fib (HCC)   Depression   Acute on chronic respiratory failure with hypoxia ([place])   Pulmonary hypertension   Centrilobular emphysema ([place])   Pulmonary scarring   CAP (community acquired pneumonia)   Pressure injury of skin   Sepsis due to Escherichia coli [facility])   Community acquired pneumonia of right upper lobe of lung ([place])   Discharge Medications: Allergies as of 06/22/2016   No Known Allergies     Medication List    TAKE these medications   aspirin 81 MG tablet Take 81 mg by mouth every morning.   aspirin-acetaminophen-caffeine 250-250-65 MG tablet Commonly known as:  EXCEDRIN MIGRAINE Take 1 tablet by mouth 2 (two) times daily.   atorvastatin 40 MG tablet Commonly known as:  LIPITOR Take 1 tablet (40 mg total) by mouth daily. What changed:  when to take this   azithromycin 250 MG tablet Commonly known as:  ZITHROMAX Take 1 tablet (250 mg total) by mouth daily.   BREO ELLIPTA 100-25 MCG/INH Aepb Generic drug:  fluticasone furoate-vilanterol Inhale 1 puff into the lungs daily.   calcium carbonate 1250 (500 Ca) MG tablet Commonly known as:  OS-CAL - dosed in mg of elemental calcium Take 1 tablet by mouth daily with breakfast.   cephALEXin 500 MG capsule Commonly known as:  KEFLEX Take 1 capsule (500 mg total) by mouth 4 (four) times daily. Start taking on:  06/23/2016   clopidogrel 75 MG tablet Commonly known as:  PLAVIX Take 1 tablet (75 mg total) by mouth at bedtime.   escitalopram 20 MG tablet Commonly known as:  LEXAPRO Take 1 tablet (20 mg total) by mouth daily. What changed:   when to take this   feeding supplement (ENSURE ENLIVE) Liqd Take 237 mLs by mouth 2 (two) times daily between meals.   HYDROcodone-acetaminophen 5-325 MG tablet Commonly known as:  NORCO/VICODIN Take 1 tablet by mouth every 8 (eight) hours as needed for moderate pain.   LORazepam 0.5 MG tablet Commonly known as:  ATIVAN Take 1 tablet (0.5 mg total) by mouth at bedtime as needed for sleep.   LUMIGAN 0.01 % Soln Generic drug:  bimatoprost Apply 1 drop in both eyes every night   metoprolol succinate 50 MG 24 hr tablet Commonly known as:  TOPROL-XL Take 1 tablet (50 mg total) by mouth daily. with or immediately following a meal.   multivitamin with minerals Tabs tablet Take 1 tablet by mouth daily.   OPSUMIT 10 MG Tabs Generic drug:  Macitentan Take 10 mg by mouth daily.   OXYGEN Inhale 2-3 L into the lungs daily. Oxygen 2lpm with rest and 3 lpm with exertion   pantoprazole 40 MG tablet Commonly known as:  PROTONIX Take 40 mg by mouth every morning.   vitamin C 1000 MG tablet Take 1,000 mg by mouth daily at 12 noon.       Disposition and follow-up:   Cristina Parker was discharged from [facility] in Stable condition.  At the hospital follow up visit please address:  1.  Gram Negative Bacteremia Ensure completion of 2 week course  of cephalexin for bacteremia.  Ask about urinary symptoms as possible source.  2.  Chronic Hypoxic Respiratory Failure Ask about dyspnea, O2 requirement.  3.  Paroxysmal Atrial Fibrillation 4.  Anticoagulation  Discuss risks and benefits of anticoagulation.   5.  Hypotension Check BP, ask about presyncopal symptoms.  6.  Anemia Check CBC, ask about bleeding.  Consider iron studies.  7.  Labs / imaging needed at time of follow-up: CBC  8.  Pending labs/ test needing follow-up: Blood cultures  Follow-up Appointments: Follow-up Information    SHAW,EVA, MD. Go in 6 day(s).   Specialty:  Family Medicine Why:  at  11:30 am, in [place] information: [place] [address] [place] Hospital Course by problem list: Principal Problem:   Bacteremia due to Escherichia coli Active Problems:   Coronary atherosclerosis   Peripheral vascular disease (HCC)   G E R D   Paroxysmal a-fib (HCC)   Depression   Acute on chronic respiratory failure with hypoxia (HCC)   Pulmonary hypertension   Centrilobular emphysema (HCC)   Pulmonary scarring   CAP (community acquired pneumonia)   Pressure injury of skin   Sepsis due to Escherichia coli [facility])   Community acquired pneumonia of right upper lobe of lung ([place])   1. Gram negative Bacteremia 2. Community Acquired Pneumonia Cristina Parker has a complicated medical history including multifactorial chronic hypoxic respiratory failure on 3-4L home O2 and CHF who presented with worsening dyspnea.  Her dyspnea rapidly resolved after reverting from Afib to NSR, but she had abnormal chest imaging, leukocytosis, and elevated procalcitonin that were concerning for bacterial pneumonia.  She was initially given empiric cefepime and vancomycin before coverage was narrowed to azithromycin and ceftriaxone for CAP.  Unexpectedly, her blood cultures grew pan-sensitive E Coli.  She had perhaps had some recent polyuria, but no clear urinary symptoms.  She was discharged with cephalexin to complete 2 weeks of antibiosis for GNR bacteremia, potentially from urinary source.  3. Atrial Fibrillation with RVR Her presenting complaint of dyspnea rapidly and resolved and her breathing returned to baseline after spontaneously reverting to NSR.  She was continued on her home metoprolol succinate 50 mg daily, which was help on one day for hypotension, and she remained in NSR.  She has CHADSVASC of 5 but is not on anticoagulation for primary prevention of stroke.  4.  Hypotension Asymptomatic BPs as low as 75/56 recorded even after she was acutely  stabilized and infection treated.  Reports that SBPs in 80s are not uncommon for her at clinic visits.  She was able to ambulate short distances and did not report any symptoms including dizziness and nausea.  5. Chronic Hypoxic Respiratory Failure 6. Pulmonary Arterial Hypertension Maintained on home 3-4L O2 via [place], and endothelin receptor antagonist, ICS, and LABA.  Breathing at baseline after resolution of AFib w/ RVR.  7.  Anemia Over her admission, her Hgb dropped from 12.0 to 9.1 without signs of bleeding.  Hemodilution is a possibliity, as she received several liters of IVF while admitted, on top of baseline chronic anemia, perhaps of chronic inflammation.  Discharge Vitals:   BP 112/88   Pulse (!) 108   Temp 98.8 F (37.1 C) (Oral)   Resp (!) 25   Ht _0  (1.499 m)   Wt 127 lb 9.6 oz (57.9 kg)   SpO2 97%   BMI 25.77 kg/m   Pertinent Labs,  Studies, and Procedures:   CBC Latest Ref Rng & Units 06/22/2016 06/20/2016 06/20/2016  WBC 4.0 - 10.5 K/uL 7.4 17.1(H) 13.0(H)  Hemoglobin 12.0 - 15.0 g/dL 9.1(L) 9.8(L) 9.1(L)  Hematocrit 36.0 - 46.0 % 30.5(L) 31.0(L) 29.6(L)  Platelets 150 - 400 K/uL 159 166 130(L)   BMP Latest Ref Rng & Units 06/20/2016 06/19/2016 06/19/2016  Glucose 65 - 99 mg/dL 107(H) 157(H) 154(H)  BUN 6 - 20 mg/dL _0 Creatinine 0.44 - 1.00 mg/dL 0.83 0.80 0.90  Sodium 135 - 145 mmol/L 139 139 137  Potassium 3.5 - 5.1 mmol/L 4.2 4.5 4.7  Chloride 101 - 111 mmol/L 104 99(L) 98(L)  CO2 22 - 32 mmol/L 31 - 28  Calcium 8.9 - 10.3 mg/dL 8.3(L) - 9.0   BNP (last 3 results)  Recent Labs  06/19/16 0536  BNP 742.3*   Component     Latest Ref Rng & Units 06/19/2016 06/21/2016  Procalcitonin     ng/mL 6.00 8.65   Component     Latest Ref Rng & Units 06/19/2016 06/19/2016 06/19/2016 06/19/2016         5:52 AM  9:24 AM 12:41 PM  4:29 PM  Lactic Acid, Venous     0.5 - 1.9 mmol/L 3.62 (HH) 2.3 (HH) 3.3 (HH) 4.6 (HH)   Component     Latest Ref Rng & Units 06/19/2016          7:07 PM  Lactic Acid, Venous     0.5 - 1.9 mmol/L 1.8   CTA Chest PE 06/19/2016 IMPRESSION: No demonstrable pulmonary embolus. Extensive atherosclerotic calcification. Findings felt to represent a degree of congestive heart failure. Consolidation in the left lower lobe posteriorly, likely primarily due to atelectasis, although a degree of superimposed pneumonia must be questioned. Mildly prominent right hilar and sub- carinal lymph nodes may well be of reactive etiology.  Small hiatal hernia with thickening in the esophagus, likely due to chronic reflux esophagitis.  Small benign adrenal adenomas. Calcified granuloma in the liver. The current with the liver raises question of a degree of underlying cirrhosis.  Advanced avascular necrosis and remodeling right humeral head. Multiple stable compression fractures in the thoracic spine.  Chest Radiograph AP 06/20/2016 IMPRESSION: Persistent CHF with interstitial edema and small bilateral pleural effusions. Atelectasis or interstitial infiltrate in the right upper lobe. Improved atelectasis or pneumonia at the left lung base.  EKG 06/19/2016 0548 Atrial fibrillation, rate 130s  Discharge Instructions: Discharge Instructions    Diet - low sodium heart healthy    Complete by:  As directed    Diet - low sodium heart healthy    Complete by:  As directed    Increase activity slowly    Complete by:  As directed    Increase activity slowly    Complete by:  As directed     You were admitted to the hospital with shortness of breath which we think was from atrial fibrillation and also a pneumonia.  We also found that you had a bloodstream infection which may be from a urinary tract infection.  I have prescribed you 2 antibiotics to take for the next 3 days, starting tomorrow 1/4.  Please review your medication list.  Follow-up with your PCP next week.  If you have fevers, chills, shortness of breath, or chest pain, please return to  the ED as these could be signs of an emergency.  Signed: Minus Liberty, MD 06/22/2016, 1:58 PM   Pager: [phone]

## 2016-06-20 NOTE — Progress Notes (Addendum)
Initial Nutrition Assessment  DOCUMENTATION CODES:   Not applicable  INTERVENTION:    Ensure Enlive po BID, each supplement provides 350 kcal and 20 grams of protein  Multivitamin once daily  NUTRITION DIAGNOSIS:   Increased nutrient needs related to chronic illness as evidenced by estimated needs.  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  Patient reports good intake PTA. She gets full quickly and eats smaller amounts than she used to, but weight has been stable. She has been consuming 40-50% of meals. Nutrition focused physical exam completed.  No muscle or subcutaneous fat depletion noticed. She likes chocolate Ensure and drinks it between meals at home.  Labs and medications reviewed.  Diet Order:  Diet Heart Room service appropriate? Yes; Fluid consistency: Thin  Skin:  Wound (see comment) (stage II to sacrum)  Last BM:  1/2  Height:   Ht Readings from Last 1 Encounters:  06/20/16 _0  (1.499 m)    Weight:   Wt Readings from Last 1 Encounters:  06/20/16 124 lb 14.4 oz (56.7 kg)    Ideal Body Weight:  44.5 kg  BMI:  Body mass index is 25.23 kg/m.  Estimated Nutritional Needs:   Kcal:  1300-1500  Protein:  65-75 gm  Fluid:  1.5 L  EDUCATION NEEDS:   No education needs identified at this time  Molli Barrows, [place], [place], [place] Pager [phone] After Hours Pager [phone]

## 2016-06-21 DIAGNOSIS — R350 Frequency of micturition: Secondary | ICD-10-CM

## 2016-06-21 DIAGNOSIS — I2723 Pulmonary hypertension due to lung diseases and hypoxia: Secondary | ICD-10-CM

## 2016-06-21 DIAGNOSIS — I2510 Atherosclerotic heart disease of native coronary artery without angina pectoris: Secondary | ICD-10-CM

## 2016-06-21 DIAGNOSIS — R3915 Urgency of urination: Secondary | ICD-10-CM

## 2016-06-21 DIAGNOSIS — Z951 Presence of aortocoronary bypass graft: Secondary | ICD-10-CM

## 2016-06-21 DIAGNOSIS — D72829 Elevated white blood cell count, unspecified: Secondary | ICD-10-CM

## 2016-06-21 DIAGNOSIS — R7989 Other specified abnormal findings of blood chemistry: Secondary | ICD-10-CM

## 2016-06-21 DIAGNOSIS — Z8619 Personal history of other infectious and parasitic diseases: Secondary | ICD-10-CM

## 2016-06-21 DIAGNOSIS — I739 Peripheral vascular disease, unspecified: Secondary | ICD-10-CM

## 2016-06-21 DIAGNOSIS — J189 Pneumonia, unspecified organism: Secondary | ICD-10-CM

## 2016-06-21 DIAGNOSIS — Z9981 Dependence on supplemental oxygen: Secondary | ICD-10-CM

## 2016-06-21 DIAGNOSIS — Z87440 Personal history of urinary (tract) infections: Secondary | ICD-10-CM

## 2016-06-21 DIAGNOSIS — I482 Chronic atrial fibrillation: Secondary | ICD-10-CM

## 2016-06-21 LAB — CULTURE, BLOOD (ROUTINE X 2)

## 2016-06-21 LAB — PROCALCITONIN: PROCALCITONIN: 8.65 ng/mL

## 2016-06-21 MED ORDER — SODIUM CHLORIDE 0.9 % IV BOLUS (SEPSIS)
500.0000 mL | Freq: Once | INTRAVENOUS | Status: AC
  Administered 2016-06-21: 500 mL via INTRAVENOUS

## 2016-06-21 NOTE — Progress Notes (Signed)
Subjective:  Patient seen and examined this morning.  Reports feeling better than yesterday.  Breathing is stable.  Reports symptoms of urinary frequency and urgency prior to admission but no abdominal pain or issues with bowel movements.  She denies any symptoms of lightheadedness or dizziness.  Objective:  Vital signs in last 24 hours: Vitals:   06/21/16 0453 06/21/16 0745 06/21/16 1018 06/21/16 1019  BP: 120/73  (!) 86/68 (!) 86/68  Pulse: 72  74 74  Resp: 17  (!) 27   Temp: 97.8 F (36.6 C) 98.1 F (36.7 C)    TempSrc: Oral Oral    SpO2: 98% 93% 97%   Weight: 128 lb 6.4 oz (58.2 kg)     Height:       Physical Exam  Constitutional: She is oriented to person, place, and time. No distress.  Very pleasant, frail appearing, Caucasian female, lying comfortably in bed.  HENT:  Head: Normocephalic and atraumatic.  Eyes: Conjunctivae and EOM are normal. No scleral icterus.  Cardiovascular: Normal rate and regular rhythm.   Pulmonary/Chest: Effort normal. No respiratory distress.  Decreased breath sounds in left base.  Abdominal: Soft. She exhibits no distension. There is no tenderness.  Neurological: She is alert and oriented to person, place, and time. No cranial nerve deficit.  Psychiatric: She has a normal mood and affect. Her behavior is normal.   CBC Latest Ref Rng & Units 06/20/2016 06/20/2016 06/19/2016  WBC 4.0 - 10.5 K/uL 17.1(H) 13.0(H) -  Hemoglobin 12.0 - 15.0 g/dL 9.8(L) 9.1(L) 13.6  Hematocrit 36.0 - 46.0 % 31.0(L) 29.6(L) 40.0  Platelets 150 - 400 K/uL 166 130(L) -   BMP Latest Ref Rng & Units 06/20/2016 06/19/2016 06/19/2016  Glucose 65 - 99 mg/dL 107(H) 157(H) 154(H)  BUN 6 - 20 mg/dL _0 Creatinine 0.44 - 1.00 mg/dL 0.83 0.80 0.90  Sodium 135 - 145 mmol/L 139 139 137  Potassium 3.5 - 5.1 mmol/L 4.2 4.5 4.7  Chloride 101 - 111 mmol/L 104 99(L) 98(L)  CO2 22 - 32 mmol/L 31 - 28  Calcium 8.9 - 10.3 mg/dL 8.3(L) - 9.0   Chest Radiograph Portable AP (ordered PA  and lateral) 06/20/2016 IMPRESSION: Persistent CHF with interstitial edema and small bilateral pleural effusions. Atelectasis or interstitial infiltrate in the right upper lobe. Improved atelectasis or pneumonia at the left lung base.  Assessment/Plan:  Principal Problem:   Bacteremia due to Escherichia coli Active Problems:   Coronary atherosclerosis   Peripheral vascular disease (HCC)   G E R D   Paroxysmal a-fib (HCC)   Depression   Acute on chronic respiratory failure with hypoxia (HCC)   Pulmonary hypertension   Centrilobular emphysema (HCC)   Pulmonary scarring   CAP (community acquired pneumonia)   Pressure injury of skin   Sepsis due to Escherichia coli [facility])   Community acquired pneumonia of right upper lobe of lung ([place])  81 year old woman with complicated cardiopulmonary history presenting with worsening of her baseline dyspnea in AFib with RVR.   She also has a leukocytosis, abnormal CXR, and elevated procalcitonin, and PNA may be contributing and could have also precipitated her AFib.  She spontaneously reverted to NSR and her breathing returned to baseline.  # E. Coli Bacteremia - likely urinary source Blood cultures 2/2 from admission growing E. Coli with susceptibilities just resulted showing pan-sensitivity.  Currently on ceftriaxone 2 gm Q24H - Plan for 1 more day of IV antibiotics and transition to PO meds  tomorrow  - Repeat blood cultures in the morning - F/U CBC  #Dyspnea #Paroxysmal AFib #Hypotension Not on anticoagulation, CHADSVASC score 5. Dyspnea resolved, on home oxygen requirement.  Hypotension this morning with MAP >75 and asymptomatic.  In NSR on the monitor. -Continue home oxygen -Hold home metoprolol succ 50 mg daily -Discuss risks/benefits of anticoagulation and recommend outpatient follow-up  #CAP Now at baseline.   -Continue ceftriaxone as above for E. Coli bacteremia.   -Discontinue azithromycin  #Chronic Hypoxic Respirtory  Failure #PAH Maintaining O2 on home 3-4L [place]. -Continue supplemental O2 titrate 90-92%. -Budesonide and brovana nebs -Continue home amcitentan -Home health PT at discharge   Dispo: Anticipated discharge in approximately 1 day(s).   Jule Ser, DO 06/21/2016, 10:56 AM Pager: [phone]

## 2016-06-22 LAB — CBC
HCT: 30.5 % — ABNORMAL LOW (ref 36.0–46.0)
HEMOGLOBIN: 9.1 g/dL — AB (ref 12.0–15.0)
MCH: 29.4 pg (ref 26.0–34.0)
MCHC: 29.8 g/dL — AB (ref 30.0–36.0)
MCV: 98.4 fL (ref 78.0–100.0)
Platelets: 159 10*3/uL (ref 150–400)
RBC: 3.1 MIL/uL — ABNORMAL LOW (ref 3.87–5.11)
RDW: 15.2 % (ref 11.5–15.5)
WBC: 7.4 10*3/uL (ref 4.0–10.5)

## 2016-06-22 MED ORDER — SODIUM CHLORIDE 0.9 % IV BOLUS (SEPSIS)
500.0000 mL | Freq: Once | INTRAVENOUS | Status: AC
  Administered 2016-06-22: 500 mL via INTRAVENOUS

## 2016-06-22 MED ORDER — DEXTROSE 5 % IV SOLN
2.0000 g | INTRAVENOUS | Status: DC
  Administered 2016-06-22: 2 g via INTRAVENOUS
  Filled 2016-06-22: qty 2

## 2016-06-22 MED ORDER — CEPHALEXIN 500 MG PO CAPS: 500.0000 mg | ORAL_CAPSULE | Freq: Four times a day (QID) | ORAL | 0 refills | Status: AC

## 2016-06-22 NOTE — Care Management Note (Addendum)
Case Management Note  Patient Details  Name: Cristina Parker MRN: [id number] Date of Birth: 1936/04/10  Subjective/Objective: Pt presented for Shortness of breath. Treated for E. Coli Bacteremia. Plan will be to return home with fai.y. Per pt she will have supervision at home via Son and daughter n Sports coach. Pt has had [facility] before but is refusing at thsi time any services. Pt states she knows exercises she can do to strengthen her endurance. Pt has used [facility] in the past. Pt has DME 02 via [place] and [place]. No DME needs at this time.  Son to bring 02 for travel home. CM did make pt aware that if she need services once she gets home to contact her PCP.            Action/Plan: No services set up for home. No further needs from this CM.   Expected Discharge Date:                  Expected Discharge Plan:  Home/Self Care  In-House Referral:  NA  Discharge planning Services  CM Consult  Post Acute Care Choice:  NA Choice offered to:  NA  DME Arranged:  N/A DME Agency:  NA  HH Arranged:  NA, Patient Refused.  [place] Agency:  NA  Status of Service:  Completed, signed off  If discussed at [place] of Stay Meetings, dates discussed:    Additional Comments:  Bethena Roys, RN 06/22/2016, 3:00 PM

## 2016-06-22 NOTE — Progress Notes (Signed)
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    PT Comments    Patient progressing slowly towards PT goals. Tolerated short distance gait training with Min guard for balance/safety. Sp02 drops to 80% on 4L/min 02 and takes ~1 minute to recover to 90% with cues for pursed lip breathing. Pt with soft BP as well. Supine BP 90/38, Sitting BP 88/65, Sitting BP post activity 87/51 asymptomatic. RN notified. Discussed energy conservation techniques- setting up chairs around house to be able to sit, short bouts of activity with longer rest breaks etc. Will follow.   Follow Up Recommendations  Home health PT     Equipment Recommendations  None recommended by PT    Recommendations for Other Services       Precautions / Restrictions Precautions Precautions: Fall;Other (comment) Precaution Comments: monitor O2 Restrictions Weight Bearing Restrictions: No    Mobility  Bed Mobility Overal bed mobility: Modified Independent             General bed mobility comments: with rail  Transfers Overall transfer level: Needs assistance Equipment used: Rolling walker (2 wheeled) Transfers: Sit to/from Stand Sit to Stand: Min guard Stand pivot transfers: Min guard       General transfer comment: Good demo of technique/hand placement.   Ambulation/Gait Ambulation/Gait assistance: Min guard Ambulation Distance (Feet): 28 Feet Assistive device: Rolling walker (2  wheeled) Gait Pattern/deviations: Step-through pattern;Decreased stride length;Trunk flexed Gait velocity: decreased   General Gait Details: Slow, mostly steady gait. Sp02 dropped to 80% on 4L/min 02, Cues for pursed lip breathing. Took ~1 min to return to 90s.   Stairs            Wheelchair Mobility    Modified Rankin (Stroke Patients Only)       Balance Overall balance assessment: Needs assistance Sitting-balance support: Feet supported;No upper extremity supported Sitting balance-Leahy Scale: Good     Standing balance support: During functional activity;Bilateral upper extremity supported Standing balance-Leahy Scale: Fair Standing balance comment: Reliant on BUE support for dynamic standing.                    Cognition Arousal/Alertness: Awake/alert Behavior During Therapy: WFL for tasks assessed/performed Overall Cognitive Status: Within Functional Limits for tasks assessed                      Exercises      General Comments        Pertinent Vitals/Pain Pain Assessment: No/denies pain    Home Living                      Prior Function            PT Goals (current goals can now be found in the care plan section) Progress towards PT goals: Progressing toward goals    Frequency    Min 3X/week      PT Plan Current plan remains appropriate    Co-evaluation             End of Session Equipment Utilized During  Treatment: Gait belt;Oxygen Activity Tolerance: Patient limited by fatigue;Treatment limited secondary to medical complications (Comment) (Sp02 dropping) Patient left: in bed;with call bell/phone within reach     Time: 5449-2010 PT Time Calculation (min) (ACUTE ONLY): 22 min  Charges:  $Therapeutic Activity: 8-22 mins                    G Codes:      Ludger Bones A Heyli Min 06/22/2016, 1:43 PM Wray Kearns, [place], DPT [phone]

## 2016-06-22 NOTE — Progress Notes (Signed)
Pt BP has been running in the 70-80s. Provider notified. New orders put in for 583m bolus. BP still in 70s after bolus. Provider aware and came to see patient. New orders for another 5057mbolus. Will continue to monitor.

## 2016-06-22 NOTE — Care Management Important Message (Signed)
Important Message  Patient Details  Name: Cristina Parker MRN: [id number] Date of Birth: [birth year]   Medicare Important Message Given:  Yes    Cristina Parker 06/22/2016, 1:47 PM

## 2016-06-22 NOTE — Progress Notes (Signed)
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   OT comments  Pt still with limited endurance but completes grooming and toileting tasks at a min guard assist level.  Oxygen sats decreased to 82% with activity when standing at the sink while still on 4Ls nasal cannula.  Increased back into the mid 90s with 1-2 mins of purse lip breathing.  BP also increased to 112/88 in sitting.  Will continue to follow acutely for OT.  Follow Up Recommendations  No OT follow up    Equipment Recommendations  None recommended by OT    Recommendations for Other Services      Precautions / Restrictions Precautions Precautions: Fall;Other (comment) Precaution Comments: monitor O2 Restrictions Weight Bearing Restrictions: No       Mobility Bed Mobility                  Transfers Overall transfer level: Needs assistance Equipment used: Rolling walker (2 wheeled) Transfers: Sit to/from Stand Sit to Stand: Min guard Stand pivot transfers: Min guard       General transfer comment: Min instructional cueing for hand placement as she wants to pull up on the walker and keep hands on the walker when sitting down.      Balance Overall balance assessment: Needs assistance   Sitting balance-Leahy Scale: Good     Standing balance support: No upper extremity supported;During functional activity Standing balance-Leahy Scale: Fair                     ADL Overall  ADL's : Needs assistance/impaired     Grooming: Wash/dry hands;Oral care;Min Dispensing optician: Min guard;Ambulation;RW;Comfort height toilet;Grab bars   Toileting- Clothing Manipulation and Hygiene: Min guard;Sit to/from stand       Functional mobility during ADLs: Min guard;Rolling walker General ADL Comments: Pt able to stand to complete 3 grooming tasks at the sink but needed 2 rest breaks, one each after standing for 1-1.5 mins secondary to oxygen sats declining to 83% on 4Ls and HR increasing to 128. Issued energy conservation strategy handout and reviewed energy conservation strategies for selfcare.    BP taken at 112/88 in sitting during resting.                 Cognition   Behavior During Therapy: WFL for tasks assessed/performed Overall Cognitive Status: Within Functional Limits for tasks assessed                                    Pertinent Vitals/ Pain       Pain Assessment: No/denies pain         Frequency  Min 2X/week        Progress Toward Goals  OT Goals(current goals can now be found in the care plan section)  Progress towards OT goals: Progressing toward goals  End of Session Equipment Utilized During Treatment: Oxygen   Activity Tolerance Patient limited by fatigue   Patient Left in chair;with call bell/phone within reach   Nurse Communication Mobility status;Other (comment) (BP and oxygen sats)        Time: 1016-1040 OT Time Calculation (min): 24 min  Charges: OT General Charges $OT Visit: 1 Procedure OT Treatments $Self Care/Home Management : 23-37 mins  Elliana Bal OTR/L 06/22/2016, 10:51 AM

## 2016-06-22 NOTE — Discharge Summary (Signed)
Medicine attending discharge note: I personally examined this patient on the day of discharge and I attest to the accuracy of the discharge evaluation and plan as recorded in the final progress note by resident physician Dr. Minus Liberty recorded on day of discharge 06/22/2016. This is also the patient's birthday.  Clinical summary: Now 81 year old woman with pulmonary hypertension, coronary artery disease status post bypass surgery, and peripheral vascular disease, admitted on January 2 with acute on chronic hypoxic respiratory failure, accelerated rate of her chronic atrial fibrillation, and signs of acute infection with leukocytosis, and elevated pro calcitonin. Chest x-ray, which I personally reviewed showed cardiomegaly, vascular congestion, and increased density at the left base concerning for early pneumonia. Treatment initiated to cover a community acquired pneumonia.   Hospital course: Treatment initiated to cover a community acquired pneumonia. Overall her condition  stabilized. She had persistent dullness to percussion and decreased breath sounds at the left lung base. No egophony. Unexpectedly, blood culture on admission  reported as growing Escherichia coli. She gave a history of a Escherichia coli urinary tract infection back in September. Sensitivities showed resistance to ampicillin, fluoroquinolones, and Septra. She has had persistent urinary urgency and frequency. It is quite possible that her current infection is related to a urinary source. She also reports a history of inflammatory bowel disease in the past but this has been inactive and not on any current treatment. She is responded clinically to a combination of ceftriaxone and azithromycin started on admission.   Sensitivities on the current Escherichia coli returned as being pan sensitive. She was transitioned to oral cephalexin at time of discharge. She was intermittently hypotensive despite appearing clinically stable,  nontoxic appearing, alert, maintaining good urine output, not tachycardic.  She is still requiring 4 L of nasal cannula oxygen to maintain adequate oxygenation. Of note she was on chronic oxygen therapy prior to admission. Platelet count which dipped to 130,000 has now recovered at 166,000.  Disposition: Condition stable at time of discharge There were no complications

## 2016-06-22 NOTE — Progress Notes (Signed)
Subjective:  Feels at baseline, breathing is stable.  Walked to bathroom and around room with PT/OT.  She denies any symptoms of lightheadedness or dizziness.  Has family at home.  Objective:  Vital signs in last 24 hours: Vitals:   06/22/16 0804 06/22/16 0809 06/22/16 1036 06/22/16 1131  BP:   112/88   Pulse:   (!) 108   Resp:      Temp: 98.2 F (36.8 C)   98.8 F (37.1 C)  TempSrc:    Oral  SpO2:  95%  97%  Weight:      Height:       Physical Exam  Constitutional: She is oriented to person, place, and time. No distress.  Very pleasant, frail appearing, Caucasian female, lying comfortably in bed.  HENT:  Head: Normocephalic and atraumatic.  Eyes: Conjunctivae and EOM are normal. No scleral icterus.  Cardiovascular: Normal rate and regular rhythm.   Pulmonary/Chest: Effort normal. No respiratory distress.  Abdominal: Soft. She exhibits no distension. There is no tenderness.  Neurological: She is alert and oriented to person, place, and time. No cranial nerve deficit.  Psychiatric: She has a normal mood and affect. Her behavior is normal.   CBC Latest Ref Rng & Units 06/22/2016 06/20/2016 06/20/2016  WBC 4.0 - 10.5 K/uL 7.4 17.1(H) 13.0(H)  Hemoglobin 12.0 - 15.0 g/dL 9.1(L) 9.8(L) 9.1(L)  Hematocrit 36.0 - 46.0 % 30.5(L) 31.0(L) 29.6(L)  Platelets 150 - 400 K/uL 159 166 130(L)   BMP Latest Ref Rng & Units 06/20/2016 06/19/2016 06/19/2016  Glucose 65 - 99 mg/dL 107(H) 157(H) 154(H)  BUN 6 - 20 mg/dL _0 Creatinine 0.44 - 1.00 mg/dL 0.83 0.80 0.90  Sodium 135 - 145 mmol/L 139 139 137  Potassium 3.5 - 5.1 mmol/L 4.2 4.5 4.7  Chloride 101 - 111 mmol/L 104 99(L) 98(L)  CO2 22 - 32 mmol/L 31 - 28  Calcium 8.9 - 10.3 mg/dL 8.3(L) - 9.0   Assessment/Plan:  Principal Problem:   Bacteremia due to Escherichia coli Active Problems:   Coronary atherosclerosis   Peripheral vascular disease (HCC)   G E R D   Paroxysmal a-fib (HCC)   Depression   Acute on chronic respiratory  failure with hypoxia (HCC)   Pulmonary hypertension   Centrilobular emphysema (HCC)   Pulmonary scarring   CAP (community acquired pneumonia)   Pressure injury of skin   Sepsis due to Escherichia coli [facility])   Community acquired pneumonia of right upper lobe of lung ([place])  81 year old woman with complicated cardiopulmonary history presenting with worsening of her baseline dyspnea in AFib with RVR.   She also has a leukocytosis, abnormal CXR, and elevated procalcitonin, and PNA may be contributing and could have also precipitated her AFib.  She spontaneously reverted to NSR and her breathing returned to baseline.  # E. Coli Bacteremia - likely urinary source Blood cultures 2/2 from admission growing E. Coli with susceptibilities just resulted showing pan-sensitivity.  Currently on ceftriaxone 2 gm Q24H.  Afebrile. - Transition to PO cephalexin to complete 2 weeks antibiosis - F/u Bcx  #Dyspnea #Paroxysmal AFib #Hypotension Not on anticoagulation, CHADSVASC score 5. Dyspnea resolved, on home oxygen requirement.  Hypotension this morning with MAP >75 and asymptomatic.  In NSR on the monitor. -Continue home oxygen -Discuss risks/benefits of anticoagulation and recommend outpatient follow-up  #CAP Now at baseline.   -Continue antibiotics as above for E. Coli bacteremia.    #Chronic Hypoxic Respirtory Failure #PAH Maintaining O2  on home 3-4L [place]. -Continue supplemental O2 titrate 90-92%. -Budesonide and brovana nebs -Continue home amcitentan -Home health PT at discharge   Dispo: Anticipated discharge today.   Minus Liberty, MD 06/22/2016, 2:33 PM Pager: [phone]

## 2016-06-27 LAB — CULTURE, BLOOD (ROUTINE X 2)
CULTURE: NO GROWTH
Culture: NO GROWTH

## 2016-06-27 NOTE — Progress Notes (Signed)
Subjective:    Patient ID: Cristina Parker, female    DOB: 22-Nov-1935, 81 y.o.   MRN: [id number] Chief Complaint  Patient presents with  . Hospitalization Follow-up    HPI  Cristina Parker is a 81 yo woman who is here today in follow-up from a hospitalization for atrial fibrillation and pneumonia. I last saw patient 3 months prior. Cristina Parker was hospitalized from 1/2 - 1/5 (so d/c'd 6d prior on her 81th birthday) for Escherichia coli bacteremia.  Acute medical concerns Community-acquired pneumonia of right upper lung: Initial elevation of pro-calcitonin and leukocytes with abnormal chest x-ray.  Initially treated with cefepime and vancomycin before transitioning to azithromycin and ceftriaxone for community-acquired pneumonia. However blood cultures then grew out pansensitive Escherichia coli bringing up the possibility that this was actually urosepsis.  Had high-dose flu shot on October 2017.  Respiratory viral panel was negative. Pt has an appt with Dr. Chase Caller on 1/16 in f/u.  She does feel like her breathing is back to normal almost.  She has been using 5-6L O2 by [place] at home.  Here on 5L at rest her O2 sat was 94%.  We then turned her down to 4L  Escherichia coli sepsis: Patient was discharged on azithromycin 250 mg daily and Keflex 500 mg 4 times a day for 2 weeks. She will complete the Keflex on January 20. Concern for possible urinary symptoms as source of bacteremia.  Blood cultures redrawn on January 5, the day of hospital discharge, were both negative.  Pt does report in Sept she got almost better on the Keflex but never felt all the way back to normal and soon after completion resumed the urinary frequency and urgency.  Urine now seems much better. She is not sure if it all the way back to normal but very close.   Paroxysmal atrial fibrillation: Was noted at cardiology visit on July 2013 but clearly diagnosed prior. At that time, as now, patient was only on anticoagulation with aspirin and  Plavix but with a CHADSVASC score of 5 she certainly has risk factors to be on more aggressive anticoagulation such as Coumadin or Xarelto. Has she discussed this with her cardiologist? Develops dyspnea when she goes into A. Fib. Follows with Dr. Johnsie Cancel.  Acute on chronic respiratory failure with hypoxia secondary to pulmonary hypertension, centrilobular emphysema, and pulmonary scarring: Followed by Dr. Chase Caller who patient last saw 3 months prior. Discharged on Breo Ellipta 100-25 one puff daily and dependent on 2 L of oxygen at rest and 3 L with exertion????. Prior to patient's hospitalization she was on 3-4 L home oxygen through [place]. Patient does have a home pulse oximetry and sev mos prior to her recent hospitalization, reported that her resting O2 sat is great when it is from 88-91% but it is not entirely unusual to drop to the 60-70%. Goal is to maintain oxygen saturations from 88-92%.  Pt's autoimmune w/u was neg. patient did have the high-dose flu shot this year. Pulmonary hypertension: On oxygen and endo??? Receptor antagonist Opsumit (macitentan) 44m qd for advance PAH therapy and breo 1 puff qd though did not note any improvement in symptoms on these CAD/PVD status post CABG: On Lipitor 40 mg daily, Toprol 50 mg daily, aspirin 81 mg, Plavix 75 mg daily at bedtime. CT last week demonstrated extensive atherosclerotic calcifications. Follows with Dr. NJohnsie Canceland Dr. EDonnetta HutchingCHF: Findings typical of CHF were seen on chest CT one week prior with BNP of 742 on day of  hosp admission - likely due to A. fib with RVR and expect EF to return to normal now that patient is back in sinus rhythm. Patient had an echo 7 months prior in May 2017 with a right heart cath the following month. The echo showed an LVEF of 50-55% with grade 2 diastolic dysfunction though of course noted severely increased systolic pulmonary artery pressure HTN: Patient tends towards hypotension but has never had any significant  orthostatic or presyncopal symptoms. Skin pressure sores:  Malnutrition: Patient has been asked to do twice daily protein supplements such as ensure drinks for the past several months and is to continue this upon hospital discharge.  On daily calcium and vitamin C supplement  Depression: On Lexapro 20 mg daily with lorazepam 0.5 mg daily at bedtime when necessary to sleep which has been approx 2-3x/wk  H/o Crohn's -mostly colonic involvement but also some duodenal stricturing. inactive for several years. Follows with Dr. Sharlett Iles prn. In 2012 she had guaiac positive stools and colonoscopy revealed collagenous colitis treated with budesonide. GERD with small hiatal hernia: Protonix 40 mg daily. On CT last week changes of chronic reflux esophagitis were seen.  Appetite was good. Eating meat, cereal, leafy greens.  Anemia: Anemia was noted at patient's wellness visit 4 months prior. Fortunately, Hemoccult cards were negative 3 so pt was advised to increase iron and protein in diet with watchful waiting. Patient did have several large episodes of epistaxis at least contributing to an iron deficient anemia with hgb 12.0 though ferritin is normal at 53. On hospital admission hemoglobin dropped from 12.0 to 9.1 of unknown etiology. Hoping that it is hemodilution and likely component of anemia of chronic disease contributing.  Review of chart shows that patient has had issues with anemia dating back well over 6 years. Her hemoglobin has dropped to 6 before requiring transfusion. In 2012 she was guaiac positive.  Pt reports memories of peptic ulcer causing GI bleed 20 years ago diagnosed by Dr. Sharlett Iles due to NSAID use. She started taking a mvi which she thinks has a little iron in but no other iron supp.  Liver abnormality: Chest CT one week prior noted the possibility of liver cirrhosis. Last abdominal imaging was 3 years prior when patient had acute pancreatitis but no liver texture abnormalities seen at that  time. Right renal artery stenosis with renal atrophy noted on abdominal CT scan 3 years prior which was last imaging. Chronic pain: Patient has advanced avascular necrosis of the right humeral head and numerous stable thoracic compression fractures. Follow with Dr. Alvan Dame  Is using hydrocodone 5 mg every 8 hours as needed for pain which is a slight increase from her twice a day use prior. Uses rolling walker or cane to ambulate (DME through [facility]). Stopped using NSAIDs 10 years prior due to GI and cardiac complications.  Pain is doing alright. Osteoporosis: Was on fosamax for several years in 1998. Saw Dr. Loanne Drilling who recommended patient start on Prolia injections but she deferred for now due to copay cost. On calcium 1223m and vit D 400u qd as instructed. Normal vitamin D one year prior. Is $100 twice a year and can afford it but isn't sure it is worth it.   Patient is living at home with her son and daughter-in-law who can help care for her when needed. She is widowed. She refused home health services as she had them in the past and felt confident that she could restart the prior exercises to help strengthen  her endurance. 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  Of note the incidental right adrenal adenoma seen on chest CT in April 2017 was unchanged on recent chest CT January 2018 so very likely benign and no additional evaluation seems indicated at this point.  Past Medical History:  Diagnosis Date  . Anemia   . Anxiety   . Arthritis   . Blood transfusion without reported diagnosis   . Cataract    BILATERAL  . Centrilobular emphysema ([place])   . Collagenous colitis   . Crohn's disease ([place])   . Depression   . Duodenal stricture   . Duodenal ulcer   . Esophageal reflux   . Heart disease   . Heart murmur   . Hiatal hernia   . Hyperlipemia   . Hypertension   . Osteoporosis   . Paroxysmal a-fib ([place]) 07/09/2011  . Peripheral vascular disease ([place])   . Pneumonia   . Pulmonary  hypertension    Past Surgical History:  Procedure Laterality Date  . ABDOMINAL HYSTERECTOMY    . 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 BUNIONECTOMY  1977   both feet   . CARDIAC CATHETERIZATION N/A 12/08/2015   Procedure: Right Heart Cath;  Surgeon: Larey Dresser, MD;  Location: [facility];  Service: Cardiovascular;  Laterality: N/A;  . CAROTID ENDARTERECTOMY  05-1998   Left CEA  . CAROTID ENDARTERECTOMY  04/11/11   Right CEA  . CERVICAL DISC SURGERY    . CESAREAN SECTION    . COLONOSCOPY    . CORONARY ARTERY BYPASS GRAFT    . EYE SURGERY  10/2010   left eye cataract  . FRACTURE SURGERY  1998   ORIF  . HIP SURGERY     right  . TUBAL LIGATION     Current Outpatient Prescriptions on File Prior to Visit  Medication Sig Dispense Refill  . Ascorbic Acid (VITAMIN C) 1000 MG tablet Take 1,000 mg by mouth daily at 12 noon.     Marland Kitchen aspirin 81 MG tablet Take 81 mg by mouth every morning.     Marland Kitchen aspirin-acetaminophen-caffeine (EXCEDRIN MIGRAINE) 250-250-65 MG tablet Take 1 tablet by mouth 2 (two) times daily.    Marland Kitchen atorvastatin (LIPITOR) 40 MG tablet Take 1 tablet (40 mg total) by mouth daily. (Patient taking differently: Take 40 mg by mouth every morning. ) 90 tablet 3  . BREO ELLIPTA 100-25 MCG/INH AEPB Inhale 1 puff into the lungs daily.    . calcium carbonate (OS-CAL - DOSED IN MG OF ELEMENTAL CALCIUM) 1250 (500 Ca) MG tablet Take 1 tablet by mouth daily with breakfast.    . escitalopram (LEXAPRO) 20 MG tablet Take 1 tablet (20 mg total) by mouth daily. (Patient taking differently: Take 20 mg by mouth every morning. ) 90 tablet 1  . feeding supplement, ENSURE ENLIVE, (ENSURE ENLIVE) LIQD Take 237 mLs by mouth 2 (two) times daily between meals. 237 mL 12  . LUMIGAN 0.01 % SOLN Apply 1 drop in both eyes every night    . Macitentan (OPSUMIT) 10 MG TABS Take 10 mg by mouth daily.    . metoprolol succinate (TOPROL-XL) 50 MG 24 hr tablet Take 1 tablet  (50 mg total) by mouth daily. with or immediately following a meal. 90 tablet 3  . Multiple Vitamin (MULTIVITAMIN WITH MINERALS) TABS tablet Take 1 tablet by mouth daily.    . OXYGEN Inhale 2-3 L into  the lungs daily. Oxygen 2lpm with rest and 3 lpm with exertion    . pantoprazole (PROTONIX) 40 MG tablet Take 40 mg by mouth every morning.      No current facility-administered medications on file prior to visit.    No Known Allergies Family History  Problem Relation Age of Onset  . Colon polyps Father   . Heart disease Father     Before age 23  . Stroke Father   . Deep vein thrombosis Father   . Vascular Disease Brother   . Alcoholism Paternal Grandfather   . Heart disease Paternal Grandmother   . Hypertension Mother   . Varicose Veins Mother   . Peripheral vascular disease Mother   . Osteoporosis Mother   . Heart disease Maternal Grandmother   . Colon cancer Neg Hx   . Esophageal cancer Neg Hx   . Rectal cancer Neg Hx   . Stomach cancer Neg Hx    Social History   Social History  . Marital status: Widowed    Spouse name: N/A  . Number of children: 2  . Years of education: N/A   Occupational History  . retired Therapist, sports    Social History Main Topics  . Smoking status: Former Smoker    Packs/day: 1.00    Years: 35.00    Types: Cigarettes    Quit date: 06/18/1996  . Smokeless tobacco: Never Used  . Alcohol use 4.2 - 8.4 oz/week    7 - 14 Standard drinks or equivalent per week     Comment: WINE  . Drug use: No  . Sexual activity: No   Other Topics Concern  . None   Social History Narrative   Retired Marine scientist;   Lives with son and grandson   Depression screen [facility] 2/9 06/28/2016 04/06/2016 02/23/2016 10/21/2015 08/24/2015  Decreased Interest 0 0 0 0 0  Down, Depressed, Hopeless 0 1 0 0 0  PHQ - 2 Score 0 1 0 0 0  Some recent data might be hidden    Review of Systems See hpi    Objective:   Physical Exam  Constitutional: She is oriented to person, place, and time. She  appears well-developed and well-nourished. No distress.  HENT:  Head: Normocephalic and atraumatic.  Right Ear: External ear normal.  Left Ear: External ear normal.  Eyes: Conjunctivae are normal. No scleral icterus.  Neck: Normal range of motion. Neck supple. No thyromegaly present.  Cardiovascular: Normal rate, regular rhythm, normal heart sounds and intact distal pulses.   Pulmonary/Chest: Effort normal and breath sounds normal. No respiratory distress.  Musculoskeletal: She exhibits no edema.  Lymphadenopathy:    She has no cervical adenopathy.  Neurological: She is alert and oriented to person, place, and time.  Skin: Skin is warm and dry. She is not diaphoretic. No erythema.  Psychiatric: She has a normal mood and affect. Her behavior is normal.      BP (!) 82/60 (BP Location: Right Arm, Patient Position: Sitting, Cuff Size: Small)   Pulse 80   Temp 98.3 F (36.8 C) (Oral)   Resp 18   Ht _0  (1.499 m)   Wt 123 lb (55.8 kg)   SpO2 94%   BMI 24.84 kg/m      Assessment & Plan:  Ua, cbc In-house, cmp, ferritin, iron, ibc, b12, folate, hep panel  Why not on anticoagulation to prevent stroke? Has had a. Fib at least since Jan 2013. Any history of cirrhosis? Patient has not been  tested for viral hepatitis. Liver studies have all been normal until recent hospitalization- Wonder if imaging findings could've been from liver congestion from possible CHF H/o CHF?? Recurrent UTIs. There is a possibility of UTI colonization but patient's recent Escherichia coli sepsis would suggest against the latter. Patient has follow-up appointment with her pulmonologist Dr. Chase Caller in 5 days.  1. Sepsis due to Escherichia coli ([place])   2. Community acquired pneumonia of right upper lobe of lung ([place])   3. Centrilobular emphysema ([place])   4. Acute on chronic respiratory failure with hypoxia ([facility])   5. Urinary tract infection without hematuria, site unspecified   6. Pulmonary hypertension     7. Paroxysmal a-fib ([facility])   8. Atherosclerosis of native coronary artery of native heart without angina pectoris   9. Arthritis   10. Physical deconditioning   11. Chronic pain syndrome   12. Anemia, unspecified type   13. Abnormal CT scan, liver     Orders Placed This Encounter  Procedures  . Urine culture  . Hepatitis panel, acute  . Comprehensive metabolic panel  . Ferritin  . IBC Panel  . Iron  . B12 and Folate Panel  . POCT CBC  . POC Hemoccult Bld/Stl (3-Cd Home Screen)    Standing Status:   Future    Standing Expiration Date:   06/28/2017  . POCT urinalysis dipstick    Meds ordered this encounter  Medications  . DISCONTD: HYDROcodone-acetaminophen (NORCO) 10-325 MG tablet    Sig: Take 1-2 tablets by mouth every 6 (six) hours as needed.    Dispense:  240 tablet    Refill:  0  . LORazepam (ATIVAN) 0.5 MG tablet    Sig: Take 1 tablet (0.5 mg total) by mouth at bedtime as needed for sleep.    Dispense:  30 tablet    Refill:  2  . HYDROcodone-acetaminophen (NORCO) 10-325 MG tablet    Sig: Take 1-2 tablets by mouth every 6 (six) hours as needed.    Dispense:  240 tablet    Refill:  0    Delman Cheadle, M.D.  [facility] [address] [place], [place] [address] [phone] phone [phone] fax  07/25/16 1:38 AM

## 2016-06-28 ENCOUNTER — Encounter: Payer: Self-pay | Admitting: Family Medicine

## 2016-06-28 ENCOUNTER — Ambulatory Visit (INDEPENDENT_AMBULATORY_CARE_PROVIDER_SITE_OTHER): Payer: Medicare Other | Admitting: Family Medicine

## 2016-06-28 VITALS — BP 82/60 | HR 80 | Temp 98.3°F | Resp 18 | Ht 59.0 in | Wt 123.0 lb

## 2016-06-28 DIAGNOSIS — J181 Lobar pneumonia, unspecified organism: Secondary | ICD-10-CM

## 2016-06-28 DIAGNOSIS — I272 Pulmonary hypertension, unspecified: Secondary | ICD-10-CM

## 2016-06-28 DIAGNOSIS — I2510 Atherosclerotic heart disease of native coronary artery without angina pectoris: Secondary | ICD-10-CM | POA: Diagnosis not present

## 2016-06-28 DIAGNOSIS — N390 Urinary tract infection, site not specified: Secondary | ICD-10-CM

## 2016-06-28 DIAGNOSIS — A4151 Sepsis due to Escherichia coli [E. coli]: Secondary | ICD-10-CM

## 2016-06-28 DIAGNOSIS — J432 Centrilobular emphysema: Secondary | ICD-10-CM

## 2016-06-28 DIAGNOSIS — D649 Anemia, unspecified: Secondary | ICD-10-CM

## 2016-06-28 DIAGNOSIS — J189 Pneumonia, unspecified organism: Secondary | ICD-10-CM

## 2016-06-28 DIAGNOSIS — R932 Abnormal findings on diagnostic imaging of liver and biliary tract: Secondary | ICD-10-CM

## 2016-06-28 DIAGNOSIS — I480 Paroxysmal atrial fibrillation: Secondary | ICD-10-CM

## 2016-06-28 DIAGNOSIS — M1990 Unspecified osteoarthritis, unspecified site: Secondary | ICD-10-CM

## 2016-06-28 DIAGNOSIS — J9621 Acute and chronic respiratory failure with hypoxia: Secondary | ICD-10-CM | POA: Diagnosis not present

## 2016-06-28 DIAGNOSIS — R5381 Other malaise: Secondary | ICD-10-CM

## 2016-06-28 DIAGNOSIS — G894 Chronic pain syndrome: Secondary | ICD-10-CM

## 2016-06-28 LAB — POCT URINALYSIS DIP (MANUAL ENTRY)
Bilirubin, UA: NEGATIVE
Blood, UA: NEGATIVE
Glucose, UA: NEGATIVE
Ketones, POC UA: NEGATIVE
LEUKOCYTES UA: NEGATIVE
NITRITE UA: NEGATIVE
PH UA: 6.5
Spec Grav, UA: 1.02
Urobilinogen, UA: 0.2

## 2016-06-28 LAB — POCT CBC
Granulocyte percent: 84.7 %G — AB (ref 37–80)
HCT, POC: 31.6 % — AB (ref 37.7–47.9)
HEMOGLOBIN: 10.2 g/dL — AB (ref 12.2–16.2)
LYMPH, POC: 0.9 (ref 0.6–3.4)
MCH, POC: 30.3 pg (ref 27–31.2)
MCHC: 32.3 g/dL (ref 31.8–35.4)
MCV: 93.8 fL (ref 80–97)
MID (cbc): 0.4 (ref 0–0.9)
MPV: 6.2 fL (ref 0–99.8)
POC Granulocyte: 7.2 — AB (ref 2–6.9)
POC LYMPH PERCENT: 10.9 %L (ref 10–50)
POC MID %: 4.4 %M (ref 0–12)
Platelet Count, POC: 190 10*3/uL (ref 142–424)
RBC: 3.37 M/uL — AB (ref 4.04–5.48)
RDW, POC: 17.9 %
WBC: 8.5 10*3/uL (ref 4.6–10.2)

## 2016-06-28 MED ORDER — LORAZEPAM 0.5 MG PO TABS: 0.5000 mg | ORAL_TABLET | Freq: Every evening | ORAL | 2 refills | Status: DC | PRN

## 2016-06-28 MED ORDER — HYDROCODONE-ACETAMINOPHEN 10-325 MG PO TABS: 1.0000 | ORAL_TABLET | Freq: Four times a day (QID) | ORAL | 0 refills | Status: DC | PRN

## 2016-06-28 NOTE — Patient Instructions (Addendum)
IF you received an x-ray today, you will receive an invoice from [facility]. Please contact [facility] at [phone] with questions or concerns regarding your invoice.   IF you received labwork today, you will receive an invoice from [facility]. Please contact [facility] at [phone] with questions or concerns regarding your invoice.   Our billing staff will not be able to assist you with questions regarding bills from these companies.  You will be contacted with the lab results as soon as they are available. The fastest way to get your results is to activate your My Chart account. Instructions are located on the last page of this paperwork. If you have not heard from [place] regarding the results in 2 weeks, please contact this office.     Iron-Rich Diet Introduction Iron is a mineral that helps your body to produce hemoglobin. Hemoglobin is a protein in your red blood cells that carries oxygen to your body's tissues. Eating too little iron may cause you to feel weak and tired, and it can increase your risk for infection. Eating enough iron is necessary for your body's metabolism, muscle function, and nervous system. Iron is naturally found in many foods. It can also be added to foods or fortified in foods. There are two types of dietary iron:  Heme iron. Heme iron is absorbed by the body more easily than nonheme iron. Heme iron is found in meat, poultry, and fish.  Nonheme iron. Nonheme iron is found in dietary supplements, iron-fortified grains, beans, and vegetables. You may need to follow an iron-rich diet if:  You have been diagnosed with iron deficiency or iron-deficiency anemia.  You have a condition that prevents you from absorbing dietary iron, such as:  Infection in your intestines.  Celiac disease. This involves long-lasting (chronic) inflammation of your intestines.  You do not eat enough iron.  You eat a diet that is high in foods that impair iron  absorption.  You have lost a lot of blood.  You have heavy bleeding during your menstrual cycle.  You are pregnant. What is my plan? Your health care provider may help you to determine how much iron you need per day based on your condition. Generally, when a person consumes sufficient amounts of iron in the diet, the following iron needs are met:  Men.  83-60 years old: 11 mg per day.  31-54 years old: 8 mg per day.  Women.  40-7 years old: 15 mg per day.  55-65 years old: 18 mg per day.  Over 75 years old: 8 mg per day.  Pregnant women: 27 mg per day.  Breastfeeding women: 9 mg per day. What do I need to know about an iron-rich diet?  Eat fresh fruits and vegetables that are high in vitamin C along with foods that are high in iron. This will help increase the amount of iron that your body absorbs from food, especially with foods containing nonheme iron. Foods that are high in vitamin C include oranges, peppers, tomatoes, and mango.  Take iron supplements only as directed by your health care provider. Overdose of iron can be life-threatening. If you were prescribed iron supplements, take them with orange juice or a vitamin C supplement.  Cook foods in pots and pans that are made from iron.  Eat nonheme iron-containing foods alongside foods that are high in heme iron. This helps to improve your iron absorption.  Certain foods and drinks contain compounds that impair iron absorption. Avoid eating these  foods in the same meal as iron-rich foods or with iron supplements. These include:  Coffee, black tea, and red wine.  Milk, dairy products, and foods that are high in calcium.  Beans, soybeans, and peas.  Whole grains.  When eating foods that contain both nonheme iron and compounds that impair iron absorption, follow these tips to absorb iron better.  Soak beans overnight before cooking.  Soak whole grains overnight and drain them before using.  Ferment flours before  baking, such as using yeast in bread dough. What foods can I eat? Grains  Iron-fortified breakfast cereal. Iron-fortified whole-wheat bread. Enriched rice. Sprouted grains. Vegetables  Spinach. Potatoes with skin. Green peas. Broccoli. Red and green bell peppers. Fermented vegetables. Fruits  Prunes. Raisins. Oranges. Strawberries. Mango. Grapefruit. Meats and Other Protein Sources  Beef liver. Oysters. Beef. Shrimp. [place]. Chicken. [place]. Sardines. Chickpeas. Nuts. Tofu. Beverages  Tomato juice. Fresh orange juice. Prune juice. Hibiscus tea. Fortified instant breakfast shakes. Condiments  Tahini. Fermented soy sauce. Sweets and Desserts  Black-strap molasses. Other  Wheat germ. The items listed above may not be a complete list of recommended foods or beverages. Contact your dietitian for more options.  What foods are not recommended? Grains  Whole grains. Bran cereal. Bran flour. Oats. Vegetables  Artichokes. Brussels sprouts. Kale. Fruits  Blueberries. Raspberries. Strawberries. Figs. Meats and Other Protein Sources  Soybeans. Products made from soy protein. Dairy  Milk. Cream. Cheese. Yogurt. Cottage cheese. Beverages  Coffee. Black tea. Red wine. Sweets and Desserts  Cocoa. Chocolate. Ice cream. Other  Basil. Oregano. Parsley. The items listed above may not be a complete list of foods and beverages to avoid. Contact your dietitian for more information.  This information is not intended to replace advice given to you by your health care provider. Make sure you discuss any questions you have with your health care provider. Document Released: 01/16/2005 Document Revised: 12/23/2015 Document Reviewed: 12/30/2013  2017 Elsevier

## 2016-06-29 LAB — COMPREHENSIVE METABOLIC PANEL
ALBUMIN: 4 g/dL (ref 3.5–4.8)
ALK PHOS: 138 IU/L — AB (ref 39–117)
ALT: 33 IU/L — ABNORMAL HIGH (ref 0–32)
AST: 36 IU/L (ref 0–40)
Albumin/Globulin Ratio: 1.5 (ref 1.2–2.2)
BUN / CREAT RATIO: 19 (ref 12–28)
BUN: 11 mg/dL (ref 8–27)
Bilirubin Total: 0.3 mg/dL (ref 0.0–1.2)
CO2: 30 mmol/L — AB (ref 18–29)
Calcium: 8.9 mg/dL (ref 8.7–10.3)
Chloride: 95 mmol/L — ABNORMAL LOW (ref 96–106)
Creatinine, Ser: 0.57 mg/dL (ref 0.57–1.00)
GFR calc Af Amer: 102 mL/min/{1.73_m2} (ref >59)
GFR calc non Af Amer: 88 mL/min/{1.73_m2} (ref >59)
GLUCOSE: 78 mg/dL (ref 65–99)
Globulin, Total: 2.7 g/dL (ref 1.5–4.5)
Potassium: 5.3 mmol/L — ABNORMAL HIGH (ref 3.5–5.2)
Sodium: 143 mmol/L (ref 134–144)
Total Protein: 6.7 g/dL (ref 6.0–8.5)

## 2016-06-29 LAB — B12 AND FOLATE PANEL: Vitamin B-12: 1023 pg/mL (ref 232–1245)

## 2016-06-29 LAB — HEPATITIS PANEL, ACUTE
HEP B S AG: NEGATIVE
Hep A IgM: UNDETERMINED — AB
Hep B C IgM: NEGATIVE
Hep C Virus Ab: <0.1 s/co ratio (ref 0.0–0.9)

## 2016-06-29 LAB — FERRITIN: Ferritin: 47 ng/mL (ref 15–150)

## 2016-06-29 LAB — IRON: Iron: 65 ug/dL (ref 27–139)

## 2016-06-30 LAB — URINE CULTURE: Organism ID, Bacteria: NO GROWTH

## 2016-07-03 ENCOUNTER — Ambulatory Visit: Payer: Medicare Other | Admitting: Internal Medicine

## 2016-07-11 ENCOUNTER — Other Ambulatory Visit: Payer: Self-pay | Admitting: Family Medicine

## 2016-07-31 ENCOUNTER — Ambulatory Visit (INDEPENDENT_AMBULATORY_CARE_PROVIDER_SITE_OTHER): Payer: Medicare Other | Admitting: Internal Medicine

## 2016-07-31 ENCOUNTER — Encounter: Payer: Self-pay | Admitting: Internal Medicine

## 2016-07-31 VITALS — BP 94/60 | HR 81 | Ht 59.0 in | Wt 124.0 lb

## 2016-07-31 DIAGNOSIS — I272 Pulmonary hypertension, unspecified: Secondary | ICD-10-CM

## 2016-07-31 DIAGNOSIS — J9611 Chronic respiratory failure with hypoxia: Secondary | ICD-10-CM

## 2016-07-31 DIAGNOSIS — J432 Centrilobular emphysema: Secondary | ICD-10-CM

## 2016-07-31 DIAGNOSIS — J984 Other disorders of lung: Secondary | ICD-10-CM

## 2016-07-31 DIAGNOSIS — R5381 Other malaise: Secondary | ICD-10-CM

## 2016-07-31 NOTE — Progress Notes (Signed)
Subjective:     Patient ID: Cristina Parker, female   DOB: 02-02-1936, 81 y.o.   MRN: [id number]  HPI    OV 09/15/2015  Chief Complaint  Patient presents with  . Pulmonary Consult    MW pt here for second opinion for pulmonary fibrosis. Pt c/o DOE. Pt deneis cough, CP/tightness and wheezing. Pt wearing 2lpm pulsed.    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 She is a 35 pack smoking history in the past. In 2012 she had normal CT scan of the chest. Then for the last few years she's had insidious onset of dyspnea that has been progressive in the last 1 year. Since end of last year is been on 3 L oxygen with exertion 2 L at rest. She feels dyspnea is progressive. It is present on exertion relieved by rest. 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 She had limited autoimmune workup summer 2016 including ANA, rheumatoid factor and CCP antibody and these were normal. She was tried on a course of prednisone since September 2016 till early 2017 but this did not help. This I confirmed on chart review and talking to her.  She is with a friend Juliann Pulse wife was also retired Marine scientist from the hospital.  Brewing technologist ILD questionnaire - Symptoms: She will occasionally coughs. It is not bothersome. She does not cough at night and the cough is dry. She has been dyspneic at a level IV-V which means that she is too breathless to leave the house and she is dyspneic when she dresses. She's been dyspneic for 1-2 years at this level - Past medical: She has heart disease hepatitis history of pleurisy and pneumonia acid reflux dysphagia sensitivity to light. 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 Denies any exposure to occupational lung disease -Personal exposure history: Denies taking  any street drugs but did smoke cigarettes from age 26, 1 pack a day and quit when she was 81 years old.  reports that she quit smoking about 19 years ago. Her smoking use included Cigarettes. She has a 35.00 pack-year smoking history. She has never used smokeless tobacco.  -Family history of lung disease: Denies -Environmental history: She lives in an old house. There is no humidifier exposure. No Jacuzzi. No birds. -   OV 11/10/2015  Chief Complaint  Patient presents with  . Follow-up    Review PFT. Using 2-3 liters O2 and at night. Denies any current breathing issues out of her normal range.     Follow dyspnea with the suspicion of interstitial lung disease in the setting of chronic hypoxemic respiratory failure.  She continues to use oxygen. She is here to review results. They're all listed below  Pulmonary function test today shows restriction with low diffusion although her lung capacity is normal. The severity of reduced diffusion is extreme while the severity of restriction as moderate on the spirometry but normal in the lung volumes. CT scan of the chest surprisingly does not show any interstitial lung disease but Echo shows basilar dysfunction grade 2 with significant pulmonary artery systolic pressure elevation at 60 mm. The medical referral to Dr. Aundra Dubin for right heart catheterization with this is pending. Autoimmune blood tests March 2017 is normal.  IMPRESSION: HRCT 4/617 1. Pulmonary parenchymal scarring in  the right hemi thorax. No definitive evidence of interstitial lung disease. 2. Right adrenal adenoma. 3. Mild emphysema centrilobular   Electronically Signed  By: Lorin Picket M.D.  On: 09/26/2015 08:27  Echo 11/03/15 shows Diast dysfhn grade 2 with significant PASP elevaltion 60s -       12/23/2015 Follow Up PAH   Pt. Presents to the office today for follow up. She had a right heart cath 8 days ago.She had elevated PAP, and was started on Opsumit by Dr.  Aundra Dubin. Her friend states that she has noted since starting this medication she has been less short of breath. Per the patient she states she is at baseline with her breathing. She wears her oxygen at 2L  [place]. When entering the exam room she had saturations in the 60's. They rebounded easily with an increase in her oxygen to 2L.We spent some time discussing the need to maintain her oxygen saturations 88-92%. We also discussed using a saturation monitor to make sure her saturations are within range. She denies fever, chest pain, orthopnea, hemoptysis.She is continuing to use her Spiriva as prescribed. She does not have a rescue inhaler.  Tests and Procedures.  Right Heart Cath ( Diagnostic )  1. Borderline elevated PCWP and mildly elevated RA pressure.  2. Preserved cardiac output.  3. Moderate pulmonary arterial hypertension.   Patient has Ashley . No definite rheumatological disease has been identified. Would plan treatment with selective pulmonary vasodilators, start with macitentan (will go ahead and start the paperwork as we complete her PAH workup). I will see in office for full PAH workup.   Right Heart Pressures RHC Procedural Findings: Hemodynamics (mmHg) RA mean 10 RV 70/14 PA 66/19, mean 41 PCWP mean 16 Oxygen saturations:  PA 57% AO 95%  Cardiac Output (Fick) 3.3  Cardiac Index (Fick) 2.2 PVR 7.6 WU  Cardiac Output (thermo) 4.16 Cardiac Index (thermo) 2.77  PVR 6 WU       OV 03/27/2016  Chief Complaint  Patient presents with  . Pulmonary Hypertension    Breathing is worse since last OV. Reports SOB. Wheezing at times. Denies chest tightness or coughing.    Follow-up chronic hypoxemic respiratory failure  I started seeing her a few months ago for evaluation of chronic hypoxemic respiratory failure. Initial suspicion was initially interstitial lung disease. However after workup we have discovered that she has mild centrilobular emphysema on CT associated with  right hemithorax pulmonary scarring. She also has pulmonary hypertension. Recently a few months ago she's been started on advance [facility] therapy. She returns for follow-up. Despite inhalers and PAH therapy for the last few months she is not any better. However she is stable. She uses 3-4 L of oxygen at rest. Is no change in her health status. She is mostly deconditioned. She sits at home hardly walks. This was a few feet. Does not even do her ADLs. She is not interested in pulmonary rehabilitation but is interested in home physical therapy.    OV 07/31/2016  Chief Complaint  Patient presents with  . Follow-up    Pt states her breathing is at baseline. Pt c/o dry cough. Pt denies CP/tightness.    Follow-up multifactorial chronic hypoxic respiratory failure  Routine visit. Last seen October 2017. She and her friend at test that she is better because of the pulmonary hypertension drug Macitentan. However objectively it seems this translates into less dyspnea when talking. She is deconditioned and does not walk much therefore this particular 6 minute walk test to  document objective improvement. She does not do pulmonary rehabilitation. She does not even do her activities of daily living. The only improvement she has notices improvement in dyspnea while talking. At the same time she tells me that she desaturates when she exerts herself to the bathroom and back. Pulse ox drops into the 70s this is on room air. There are no new issues.  Pulse oximetry 74% on room air at rest after being offf oxygen for 5-10 minutes     has a past medical history of Anemia; Anxiety; Arthritis; Blood transfusion without reported diagnosis; Cataract; Centrilobular emphysema ([place]); Collagenous colitis; Crohn's disease ([place]); Depression; Duodenal stricture; Duodenal ulcer; Esophageal reflux; Heart disease; Heart murmur; Hiatal hernia; Hyperlipemia; Hypertension; Osteoporosis; Paroxysmal a-fib ([place]) (07/09/2011); Peripheral  vascular disease ([place]); Pneumonia; and Pulmonary hypertension.   reports that she quit smoking about 20 years ago. Her smoking use included Cigarettes. She has a 35.00 pack-year smoking history. She has never used smokeless tobacco.  Past Surgical History:  Procedure Laterality Date  . ABDOMINAL HYSTERECTOMY    . 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 BUNIONECTOMY  1977   both feet   . CARDIAC CATHETERIZATION N/A 12/08/2015   Procedure: Right Heart Cath;  Surgeon: Larey Dresser, MD;  Location: [facility];  Service: Cardiovascular;  Laterality: N/A;  . CAROTID ENDARTERECTOMY  05-1998   Left CEA  . CAROTID ENDARTERECTOMY  04/11/11   Right CEA  . CERVICAL DISC SURGERY    . CESAREAN SECTION    . COLONOSCOPY    . CORONARY ARTERY BYPASS GRAFT    . EYE SURGERY  10/2010   left eye cataract  . FRACTURE SURGERY  1998   ORIF  . HIP SURGERY     right  . TUBAL LIGATION      No Known Allergies  Immunization History  Administered Date(s) Administered  . Hepatitis B 06/18/2001  . Influenza Split 03/19/2011, 02/28/2012  . Influenza, High Dose Seasonal PF 03/27/2016  . Influenza,inj,Quad PF,36+ Mos 03/13/2013, 04/09/2014, 03/09/2015  . Pneumococcal Conjugate-13 05/21/2014  . Pneumococcal Polysaccharide-23 06/22/2004  . Td 02/02/2010  . Zoster 06/18/1998    Family History  Problem Relation Age of Onset  . Colon polyps Father   . Heart disease Father     Before age 32  . Stroke Father   . Deep vein thrombosis Father   . Vascular Disease Brother   . Alcoholism Paternal Grandfather   . Heart disease Paternal Grandmother   . Hypertension Mother   . Varicose Veins Mother   . Peripheral vascular disease Mother   . Osteoporosis Mother   . Heart disease Maternal Grandmother   . Colon cancer Neg Hx   . Esophageal cancer Neg Hx   . Rectal cancer Neg Hx   . Stomach cancer Neg Hx      Current Outpatient Prescriptions:  .  Ascorbic Acid  (VITAMIN C) 1000 MG tablet, Take 1,000 mg by mouth daily at 12 noon. , Disp: , Rfl:  .  aspirin 81 MG tablet, Take 81 mg by mouth every morning. , Disp: , Rfl:  .  aspirin-acetaminophen-caffeine (EXCEDRIN MIGRAINE) 250-250-65 MG tablet, Take 1 tablet by mouth 2 (two) times daily., Disp: , Rfl:  .  atorvastatin (LIPITOR) 40 MG tablet, Take 1 tablet (40 mg total) by mouth daily. (Patient taking differently: Take 40 mg by mouth every morning. ), Disp: 90 tablet, Rfl: 3 .  BREO ELLIPTA 100-25 MCG/INH  AEPB, Inhale 1 puff into the lungs daily., Disp: , Rfl:  .  calcium carbonate (OS-CAL - DOSED IN MG OF ELEMENTAL CALCIUM) 1250 (500 Ca) MG tablet, Take 1 tablet by mouth daily with breakfast., Disp: , Rfl:  .  clopidogrel (PLAVIX) 75 MG tablet, TAKE 1 TABLET BY MOUTH EVERY NIGHT AT BEDTIME, Disp: 90 tablet, Rfl: 0 .  escitalopram (LEXAPRO) 20 MG tablet, Take 1 tablet (20 mg total) by mouth daily. (Patient taking differently: Take 20 mg by mouth every morning. ), Disp: 90 tablet, Rfl: 1 .  feeding supplement, ENSURE ENLIVE, (ENSURE ENLIVE) LIQD, Take 237 mLs by mouth 2 (two) times daily between meals., Disp: 237 mL, Rfl: 12 .  HYDROcodone-acetaminophen (NORCO) 10-325 MG tablet, Take 1-2 tablets by mouth every 6 (six) hours as needed., Disp: 240 tablet, Rfl: 0 .  LORazepam (ATIVAN) 0.5 MG tablet, Take 1 tablet (0.5 mg total) by mouth at bedtime as needed for sleep., Disp: 30 tablet, Rfl: 2 .  LUMIGAN 0.01 % SOLN, Apply 1 drop in both eyes every night, Disp: , Rfl:  .  Macitentan (OPSUMIT) 10 MG TABS, Take 10 mg by mouth daily., Disp: , Rfl:  .  metoprolol succinate (TOPROL-XL) 50 MG 24 hr tablet, Take 1 tablet (50 mg total) by mouth daily. with or immediately following a meal., Disp: 90 tablet, Rfl: 3 .  Multiple Vitamin (MULTIVITAMIN WITH MINERALS) TABS tablet, Take 1 tablet by mouth daily., Disp: , Rfl:  .  OXYGEN, Inhale 2-3 L into the lungs daily. Oxygen 2lpm with rest and 3 lpm with exertion, Disp: , Rfl:   .  pantoprazole (PROTONIX) 40 MG tablet, Take 40 mg by mouth every morning. , Disp: , Rfl:     Review of Systems     Objective:   Physical Exam  Vitals:   07/31/16 1150  BP: 94/60  Pulse: 81  SpO2: 93%  Weight: 124 lb (56.2 kg)  Height: _0  (1.499 m)    Estimated body mass index is 25.04 kg/m as calculated from the following:   Height as of this encounter: _1  (1.499 m).   Weight as of this encounter: 124 lb (56.2 kg).  Frail female sitting on wheel chair Looks the same as before decondition Alert and oriented 3 Speaks well Clear to auscultation bilaterally Normal heart sounds Soft abdomen Dry skin No edema     Assessment:       ICD-9-CM ICD-10-CM   1. Chronic respiratory failure with hypoxia (HCC) 518.83 J96.11    799.02    2. Centrilobular emphysema (HCC) 492.8 J43.2   3. Pulmonary hypertension 416.8 I27.20   4. Pulmonary scarring 518.89 J98.4   5. Physical deconditioning 799.3 R53.81    Overall stable disease    Plan:       You are 3-4L o2 dependent due to above reasons You are on max medical therapy  Plan  - continue 3-4 Lo2 - goal pulse ox > 88%  continue macitentan -per Dr Loralie Champagne - continue inhalers as before  Followup Sept 2018 or sooner if needed    Dr. Brand Males, M.D., [facility].C.P Pulmonary and Critical Care Medicine Staff Physician [place] [facility] Pager: [phone], If no answer or between  15:00h - 7:00h: call [phone]  07/31/2016 12:13 PM

## 2016-07-31 NOTE — Patient Instructions (Addendum)
ICD-9-CM ICD-10-CM   1. Chronic respiratory failure with hypoxia (HCC) 518.83 J96.11    799.02    2. Centrilobular emphysema (HCC) 492.8 J43.2   3. Pulmonary hypertension 416.8 I27.20   4. Pulmonary scarring 518.89 J98.4   5. Physical deconditioning 799.3 R53.81      You are 3-4L o2 dependent due to above reasons You are on max medical therapy  Plan  - continue 3-4 Lo2 - goal pulse ox > 88%  continue macitentan -per Dr Loralie Champagne - continue inhalers as before  Followup Sept 2018 or sooner if needed

## 2016-08-29 ENCOUNTER — Other Ambulatory Visit: Payer: Self-pay | Admitting: Internal Medicine

## 2016-09-27 ENCOUNTER — Ambulatory Visit: Payer: Medicare Other | Admitting: Family Medicine

## 2016-10-01 ENCOUNTER — Encounter: Payer: Self-pay | Admitting: Family Medicine

## 2016-10-01 ENCOUNTER — Ambulatory Visit (INDEPENDENT_AMBULATORY_CARE_PROVIDER_SITE_OTHER): Payer: Medicare Other | Admitting: Family Medicine

## 2016-10-01 VITALS — BP 91/76 | HR 99 | Temp 98.1°F | Resp 16 | Ht 59.0 in | Wt 123.0 lb

## 2016-10-01 DIAGNOSIS — D638 Anemia in other chronic diseases classified elsewhere: Secondary | ICD-10-CM

## 2016-10-01 DIAGNOSIS — I272 Pulmonary hypertension, unspecified: Secondary | ICD-10-CM | POA: Diagnosis not present

## 2016-10-01 DIAGNOSIS — N183 Chronic kidney disease, stage 3 unspecified: Secondary | ICD-10-CM

## 2016-10-01 DIAGNOSIS — I480 Paroxysmal atrial fibrillation: Secondary | ICD-10-CM | POA: Diagnosis not present

## 2016-10-01 DIAGNOSIS — R740 Nonspecific elevation of levels of transaminase and lactic acid dehydrogenase [LDH]: Secondary | ICD-10-CM

## 2016-10-01 DIAGNOSIS — I2510 Atherosclerotic heart disease of native coronary artery without angina pectoris: Secondary | ICD-10-CM | POA: Diagnosis not present

## 2016-10-01 DIAGNOSIS — Z5181 Encounter for therapeutic drug level monitoring: Secondary | ICD-10-CM

## 2016-10-01 DIAGNOSIS — I10 Essential (primary) hypertension: Secondary | ICD-10-CM | POA: Diagnosis not present

## 2016-10-01 DIAGNOSIS — J9611 Chronic respiratory failure with hypoxia: Secondary | ICD-10-CM | POA: Diagnosis not present

## 2016-10-01 DIAGNOSIS — R5381 Other malaise: Secondary | ICD-10-CM

## 2016-10-01 DIAGNOSIS — I6523 Occlusion and stenosis of bilateral carotid arteries: Secondary | ICD-10-CM

## 2016-10-01 DIAGNOSIS — R7401 Elevation of levels of liver transaminase levels: Secondary | ICD-10-CM

## 2016-10-01 MED ORDER — LORAZEPAM 0.5 MG PO TABS: 0.2500 mg | ORAL_TABLET | Freq: Two times a day (BID) | ORAL | 3 refills | Status: AC

## 2016-10-01 MED ORDER — HYDROCODONE-ACETAMINOPHEN 10-325 MG PO TABS: 1.0000 | ORAL_TABLET | Freq: Four times a day (QID) | ORAL | 0 refills | Status: AC | PRN

## 2016-10-01 MED ORDER — HYDROCODONE-ACETAMINOPHEN 10-325 MG PO TABS: 1.0000 | ORAL_TABLET | Freq: Four times a day (QID) | ORAL | 0 refills | Status: DC | PRN

## 2016-10-01 NOTE — Patient Instructions (Addendum)
At your next visit, I want to repeat your chest xray. I will refer you back to Dr. Loanne Drilling to discuss treatment for your osteoporosis.    IF you received an x-ray today, you will receive an invoice from [facility]. Please contact [facility] at [phone] with questions or concerns regarding your invoice.   IF you received labwork today, you will receive an invoice from [facility]. Please contact [facility] at [phone] with questions or concerns regarding your invoice.   Our billing staff will not be able to assist you with questions regarding bills from these companies.  You will be contacted with the lab results as soon as they are available. The fastest way to get your results is to activate your My Chart account. Instructions are located on the last page of this paperwork. If you have not heard from [place] regarding the results in 2 weeks, please contact this office.     Community Occupational psychologist of Services Cost  A Matter of Balance Class locations vary. Call [place] on Aging for more information.  [email or web address]-[email or web address]/ [phone] 8-Session program addressing the fear of falling and increasing activity levels of older adults Free to minimal cost  A.C.T. By [employer] [address], [place], [place] [address].  BetaBlues.dk [phone]  Personal training, gym, classes including Silver Sneakers* and ACTion for Aging Adults Fee-based  A.H.O.Y. (Add Health to [place]) Airs on Time [employer] 13, M-F at [place]: [employer],  [place] [facility] [place],  [place], [place] [facility], [address] [facility], [place], [place], [place] [address]  [place] Location: Sharrell Ku. [employer] [place]      [phone]  [phone]  [phone]  [phone]  [phone]  [phone]  [phone]  [phone]  [phone]  [phone]    [phone] A total-body conditioning class for adults 54 and older; designed to increase muscular strength, endurance, range of movement, flexibility, balance, agility and coordination Free  [facility] [place], [place] [address] [place]      [address]      [phone]      Pilate's class for individualsreturning to exercise after an injury, before or after surgery or for individuals with complex musculoskeletal issues; designed to improve strength, balance , flexibility      $15/class  [place] [address] [place], [place] [address] [email or web address] [place] classes for beginners to advanced [place], [place] [address] [email or web address] [place] Chair Exercises Free, ages 22 and older; Ages 30-59 fee based  Marvia Pickles, [facility] [address] [place], [place] [address] Seniorcenter_1 .Beverlee Nims [phone]  A.H.O.Y. Tai Chi Fee-based Donation based or free  [place] Class locations vary.  Call or email Angela Burke or view website for more information. Info_[email or web address] .com GainPain.com.cy.html [phone] Ongoing classes at local YMCAs and gyms Fee-based  Silver Sneakers A.C.T. By [place] [employer]:  [facility] [place] [phone]  [phone] Classes designed for older adults who want to improve their strength, flexibility, balance and endurance.   Silver sneakers is covered by some insurance plans and includes a fitness center membership at participating locations. Find out more by calling [phone] or visiting [email or web address] Covered by some insurance plans  [facility] [place] [phone] A.H.O.Y., fitness room, personal training, fitness classes for injury prevention, strength, balance, flexibility, water fitness classes Ages 55+: $47 for 6 months; Ages 84-54: $20 for 6 months  Tai Chi for Everybody [facility] [address] [place], [place] [address] Taichiforeverybody_0 .Patsi Sears [phone] Tai Chi classes for beginners to advanced; geared for seniors Donation Based      [employer] ([place] Others Participate in Exercise     Loyal Gambler. Rosana Hoes, PhD, [place] [email or web address] [place]     [phone]     A comprehensive fitness program for adults.  The program paris senior-level undergraduates Kinesiology students with adults who desire to learn how to exercise safely.  Includes a structural exercise class focusing on functional fitnesss     $100/semester in fall and spring; $75 in summer (no trainers)    *Silver Sneakers is covered by some Personal assistant and includes a  Radio producer at participating locations.  Find out more by calling [phone] or visiting [email or web address]  For additional health and human services resources for senior adults, please contact SeniorLine at [phone] in [place] and [place] at [phone] in all other areas.

## 2016-10-01 NOTE — Progress Notes (Signed)
Subjective:    Patient ID: Cristina Parker, female    DOB: 06-20-1935, 81 y.o.   MRN: [id number] Chief Complaint  Patient presents with  . Follow-up    3 month    HPI Community-acquired pneumonia of right upper lung: Initial elevation of pro-calcitonin and leukocytes with abnormal chest x-ray.  Initially treated with cefepime and vancomycin before transitioning to azithromycin and ceftriaxone for community-acquired pneumonia. However blood cultures then grew out pansensitive Escherichia coli bringing up the possibility that this was actually urosepsis.  Had high-dose flu shot on October 2017.  Respiratory viral panel was negative. Pt has an appt with Dr. Chase Caller on 1/16 in f/u.  She does feel like her breathing is back to normal almost.  She has been using 5-6L O2 by [place] at home.  Here on 5L at rest her O2 sat was 94%.  We then turned her down to 4L coughing not to much. She stays up at 4L but most of the time at 5L due to shortness of breath even with minimal ambulation to bathroom.  Has to be turned up to 6L. She might be getting lest At nights gets down to 4L.  The hydrocodone helps her to be able to rest.   Escherichia coli sepsis: Patient was discharged on azithromycin 250 mg daily and Keflex 500 mg 4 times a day for 2 weeks. She will complete the Keflex on January 20. Concern for possible urinary symptoms as source of bacteremia.  Blood cultures redrawn on January 5, the day of hospital discharge, were both negative.  Pt does report in Sept she got almost better on the Keflex but never felt all the way back to normal and soon after completion resumed the urinary frequency and urgency.  Urine now seems much better. She is not sure if it all the way back to normal but very close.  no urinary incontinence, no freq or urgency. Bowels vary - between diarreha with UC so uses imodium prn, no hematochezia or melena. Paroxysmal atrial fibrillation: Was noted at cardiology visit on July 2013 but clearly  diagnosed prior. At that time, as now, patient was only on anticoagulation with aspirin and Plavix but with a CHADSVASC score of 5 she certainly has risk factors to be on more aggressive anticoagulation such as Coumadin or Xarelto. Has she discussed this with her cardiologist? Develops dyspnea when she goes into A. Fib. Follows with Dr. Johnsie Cancel.  When O2 sat goes up, then pulse goes up.  But at rest her pulse is in the 60s - sees with her O2 sat monitor. Highest last wk is 116. No palpitations. Probably stressed out.  Does get nose bleeds. Echo  Acute on chronic respiratory failure with hypoxia secondary to pulmonary hypertension, centrilobular emphysema, and pulmonary scarring: Followed by Dr. Chase Caller who patient last saw 3 months prior. Discharged on Breo Ellipta 100-25 one puff daily and dependent on 2 L of oxygen at rest and 3 L with exertion????. Prior to patient's hospitalization she was on 3-4 L home oxygen through [place]. Patient does have a home pulse oximetry and sev mos prior to her recent hospitalization, reported that her resting O2 sat is great when it is from 88-91% but it is not entirely unusual to drop to the 60-70%. Goal is to maintain oxygen saturations from 88-92%.  Pt's autoimmune w/u was neg. patient did have the high-dose flu shot this year. Pulmonary hypertension: On oxygen and endo??? Receptor antagonist Opsumit (macitentan) [facility] and breo 1 puff qd though did not note any improvement in symptoms on these Echo 1 yr prior CAD/PVD status post CABG: On Lipitor 40 mg daily, Toprol 50 mg daily, aspirin 81 mg, Plavix 75 mg daily at bedtime. CT last week demonstrated extensive atherosclerotic calcifications. Follows with Dr. Johnsie Cancel and Dr. Donnetta Hutching CHF: Findings typical of CHF were seen on chest CT one week prior with BNP of 742 on day of hosp admission - likely due to A. fib with RVR and expect EF to return to normal now that patient is back in sinus rhythm. Patient  had an echo 7 months prior in May 2017 with a right heart cath the following month. The echo showed an LVEF of 50-55% with grade 2 diastolic dysfunction though of course noted severely increased systolic pulmonary artery pressure HTN: Patient tends towards hypotension but has never had any significant orthostatic or presyncopal symptoms. Skin pressure sores: Has always had recurrent skin sores and so still has some drainage "down there" with a h/o I&D but not currently draining. Itches but can't see it. Washes it with wet wipe and cream. No sig pain. Malnutrition: Patient has been asked to do twice daily protein supplements such as ensure drinks for the past several months and is to continue this upon hospital discharge.  On daily calcium and vitamin C supplement. Does a boost 1-2x/d and plenty of water, good protein in diet.  Depression: On Lexapro 20 mg daily with lorazepam 0.5 mg daily to help calm her down and then hydrocodone at night to help her slepp H/o Crohn's -mostly colonic involvement but also some duodenal stricturing. inactive for several years. Follows with Dr. Sharlett Iles prn. In 2012 she had guaiac positive stools and colonoscopy revealed collagenous colitis treated with budesonide. She is having more poblems with early satiety and bloating. No sig gas GERD with small hiatal hernia: Protonix 40 mg daily. On CT last week changes of chronic reflux esophagitis were seen.  Appetite was good. Eating meat, cereal, leafy greens.  Anemia: Anemia was noted at patient's wellness visit 4 months prior. Fortunately, Hemoccult cards were negative 3 so pt was advised to increase iron and protein in diet with watchful waiting. Patient did have several large episodes of epistaxis at least contributing to an iron deficient anemia with hgb 12.0 though ferritin is normal at 53. On hospital admission hemoglobin dropped from 12.0 to 9.1 of unknown etiology. Hoping that it is hemodilution and likely component of  anemia of chronic disease contributing.  Review of chart shows that patient has had issues with anemia dating back well over 6 years. Her hemoglobin has dropped to 6 before requiring transfusion. In 2012 she was guaiac positive.  Pt reports memories of peptic ulcer causing GI bleed 20 years ago diagnosed by Dr. Sharlett Iles due to NSAID use. She started taking a mvi which she thinks has a little iron in but no other iron supp.  Liver abnormality: Chest CT one week prior noted the possibility of liver cirrhosis. Last abdominal imaging was 3 years prior when patient had acute pancreatitis but no liver texture abnormalities seen at that time. Right renal artery stenosis with renal atrophy noted on abdominal CT scan 3 years prior which was last imaging. Chronic pain: Patient has advanced avascular necrosis of the right humeral head and numerous stable thoracic compression fractures. Follow with Dr. Alvan Dame but has not need to see him.  Is using hydrocodone 5 mg every 8 hours as needed for pain which  is a slight increase from her twice a day use prior. Uses rolling walker or cane to ambulate (DME through [facility]). Stopped using NSAIDs 10 years prior due to GI and cardiac complications.  Pain is doing alright. Osteoporosis: Was on fosamax for several years in 1998. Saw Dr. Loanne Drilling who recommended patient start on Prolia injections but she deferred for now due to copay cost. On calcium 123m and vit D 400u qd as instructed. Normal vitamin D one year prior. Is $100 twice a year and can afford it but isn't sure it is worth it.   SHe did do home health PT after her hospitalazation but no longer doing them.   Past Medical History:  Diagnosis Date  . Anemia   . Anxiety   . Arthritis   . Blood transfusion without reported diagnosis   . Cataract    BILATERAL  . Centrilobular emphysema ([facility]   . Collagenous colitis   . Crohn's disease ([facility]   . Depression   . Duodenal stricture   . Duodenal ulcer   . Esophageal reflux    . Heart disease   . Heart murmur   . Hiatal hernia   . Hyperlipemia   . Hypertension   . Osteoporosis   . Paroxysmal [place]-fib ([facility] 07/09/2011  . Peripheral vascular disease ([facility]   . Pneumonia   . Pulmonary hypertension ([facility]    Past Surgical History:  Procedure Laterality Date  . ABDOMINAL HYSTERECTOMY    . 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 BUNIONECTOMY  1977   both feet   . CARDIAC CATHETERIZATION N/A 12/08/2015   Procedure: Right Heart Cath;  Surgeon: DLarey Dresser MD;  [facility];  Service: Cardiovascular;  Laterality: N/A;  . CAROTID ENDARTERECTOMY  05-1998   Left CEA  . CAROTID ENDARTERECTOMY  04/11/11   Right CEA  . CERVICAL DISC SURGERY    . CESAREAN SECTION    . COLONOSCOPY    . CORONARY ARTERY BYPASS GRAFT    . EYE SURGERY  10/2010   left eye cataract  . FRACTURE SURGERY  1998   ORIF  . HIP SURGERY     right  . TUBAL LIGATION     Current Outpatient Prescriptions on File Prior to Visit  Medication Sig Dispense Refill  . Ascorbic Acid (VITAMIN C) 1000 MG tablet Take 1,000 mg by mouth daily at 12 noon.     .Marland Kitchenaspirin 81 MG tablet Take 81 mg by mouth every morning.     .Marland Kitchenaspirin-acetaminophen-caffeine (EXCEDRIN MIGRAINE) 250-250-65 MG tablet Take 1 tablet by mouth 2 (two) times daily.    .Marland Kitchenatorvastatin (LIPITOR) 40 MG tablet Take 1 tablet (40 mg total) by mouth daily. (Patient taking differently: Take 40 mg by mouth every morning. ) 90 tablet 3  . BREO ELLIPTA 100-25 MCG/INH AEPB Inhale 1 puff into the lungs daily.    .Marland KitchenBREO ELLIPTA 100-25 MCG/INH AEPB INHALE 1 PUFF INTO THE LUNGS DAILY 30 each 2  . calcium carbonate (OS-CAL - DOSED IN MG OF ELEMENTAL CALCIUM) 1250 (500 Ca) MG tablet Take 1 tablet by mouth daily with breakfast.    . feeding supplement, ENSURE ENLIVE, (ENSURE ENLIVE) LIQD Take 237 mLs by mouth 2 (two) times daily between meals. 237 mL 12  . LUMIGAN 0.01 % SOLN Apply 1 drop in both eyes every  night    . Macitentan (OPSUMIT) 10 MG TABS Take 10 mg by mouth daily.    .Marland Kitchen  metoprolol succinate (TOPROL-XL) 50 MG 24 hr tablet Take 1 tablet (50 mg total) by mouth daily. with or immediately following a meal. 90 tablet 3  . Multiple Vitamin (MULTIVITAMIN WITH MINERALS) TABS tablet Take 1 tablet by mouth daily.    . OXYGEN Inhale 2-3 L into the lungs daily. Oxygen 2lpm with rest and 3 lpm with exertion    . pantoprazole (PROTONIX) 40 MG tablet Take 40 mg by mouth every morning.      No current facility-administered medications on file prior to visit.    No Known Allergies Family History  Problem Relation Age of Onset  . Colon polyps Father   . Heart disease Father     Before age 18  . Stroke Father   . Deep vein thrombosis Father   . Vascular Disease Brother   . Alcoholism Paternal Grandfather   . Heart disease Paternal Grandmother   . Hypertension Mother   . Varicose Veins Mother   . Peripheral vascular disease Mother   . Osteoporosis Mother   . Heart disease Maternal Grandmother   . Colon cancer Neg Hx   . Esophageal cancer Neg Hx   . Rectal cancer Neg Hx   . Stomach cancer Neg Hx    Social History   Social History  . Marital status: Widowed    Spouse name: N/A  . Number of children: 2  . Years of education: N/A   Occupational History  . retired Therapist, sports    Social History Main Topics  . Smoking status: Former Smoker    Packs/day: 1.00    Years: 35.00    Types: Cigarettes    Quit date: 06/18/1996  . Smokeless tobacco: Never Used  . Alcohol use 4.2 - 8.4 oz/week    7 - 14 Standard drinks or equivalent per week     Comment: WINE  . Drug use: No  . Sexual activity: No   Other Topics Concern  . None   Social History Narrative   Retired Marine scientist;   Lives with son and grandson   Depression screen [facility] 2/9 10/01/2016 06/28/2016 04/06/2016 02/23/2016 10/21/2015  Decreased Interest 0 0 0 0 0  Down, Depressed, Hopeless 0 0 1 0 0  PHQ - 2 Score 0 0 1 0 0  Some recent data might be  hidden    Review of Systems See hpi    Objective:   Physical Exam  Constitutional: She is oriented to person, place, and time. She appears well-developed and well-nourished. No distress.  HENT:  Head: Normocephalic and atraumatic.  Right Ear: External ear normal.  Left Ear: External ear normal.  Eyes: Conjunctivae are normal. No scleral icterus.  Neck: Normal range of motion. Neck supple. No thyromegaly present.  Cardiovascular: Normal rate, regular rhythm, normal heart sounds and intact distal pulses.   Difficult to appreciate due to pulmonary status  Pulmonary/Chest: Effort normal. No respiratory distress. She has decreased breath sounds.  Musculoskeletal: She exhibits no edema.  Lymphadenopathy:    She has no cervical adenopathy.  Neurological: She is alert and oriented to person, place, and time.  Skin: Skin is warm and dry. She is not diaphoretic. No erythema.  Psychiatric: She has a normal mood and affect. Her behavior is normal.         BP 91/76   Pulse 99   Temp 98.1 F (36.7 C) (Oral)   Resp 16   Ht _0  (1.499 m)   Wt 123 lb (55.8 kg)   SpO2  93%   BMI 24.84 kg/m   Assessment & Plan:  cmp,cbc, ua Needs to f/u with cardiology CXR - plan ahead to have done at 102 before next visit Refer back to endocrionlogy Missed hat when tried to give UA  Fasting today.  1. Chronic respiratory failure with hypoxia ([place])   2. CKD (chronic kidney disease) stage 3, GFR 30-59 ml/min   3. Transaminitis   4. Anemia of chronic disease   5. Medication monitoring encounter   6. Physical deconditioning   7. Bilateral carotid artery stenosis   8. Atherosclerosis of native coronary artery of native heart without angina pectoris   9. Essential hypertension   10. Paroxysmal A-fib ([place])   11. Pulmonary hypertension ([place])     Orders Placed This Encounter  Procedures  . CBC with Differential/Platelet  . Comprehensive metabolic panel    Meds ordered this encounter    Medications  . DISCONTD: HYDROcodone-acetaminophen (NORCO) 10-325 MG tablet    Sig: Take 1-2 tablets by mouth every 6 (six) hours as needed.    Dispense:  240 tablet    Refill:  0  . LORazepam (ATIVAN) 0.5 MG tablet    Sig: Take 0.5 tablets (0.25 mg total) by mouth 2 (two) times daily.    Dispense:  30 tablet    Refill:  3  . HYDROcodone-acetaminophen (NORCO) 10-325 MG tablet    Sig: Take 1-2 tablets by mouth every 6 (six) hours as needed.    Dispense:  240 tablet    Refill:  0    Delman Cheadle, M.D.  Primary Care at [facility] [address] [place], [place] [address] [phone] phone [phone] fax  10/17/16 11:17 PM

## 2016-10-02 LAB — COMPREHENSIVE METABOLIC PANEL
ALBUMIN: 3.5 g/dL (ref 3.5–4.8)
ALK PHOS: 122 IU/L — AB (ref 39–117)
ALT: 20 IU/L (ref 0–32)
AST: 28 IU/L (ref 0–40)
Albumin/Globulin Ratio: 1.4 (ref 1.2–2.2)
BUN/Creatinine Ratio: 33 — ABNORMAL HIGH (ref 12–28)
BUN: 23 mg/dL (ref 8–27)
Bilirubin Total: 0.3 mg/dL (ref 0.0–1.2)
CO2: 30 mmol/L — AB (ref 18–29)
CREATININE: 0.7 mg/dL (ref 0.57–1.00)
Calcium: 8.8 mg/dL (ref 8.7–10.3)
Chloride: 101 mmol/L (ref 96–106)
GFR calc Af Amer: 95 mL/min/{1.73_m2} (ref >59)
GFR calc non Af Amer: 83 mL/min/{1.73_m2} (ref >59)
GLUCOSE: 103 mg/dL — AB (ref 65–99)
Globulin, Total: 2.5 g/dL (ref 1.5–4.5)
Potassium: 4.1 mmol/L (ref 3.5–5.2)
Sodium: 145 mmol/L — ABNORMAL HIGH (ref 134–144)
Total Protein: 6 g/dL (ref 6.0–8.5)

## 2016-10-02 LAB — CBC WITH DIFFERENTIAL/PLATELET
BASOS ABS: 0 10*3/uL (ref 0.0–0.2)
Basos: 0 %
EOS (ABSOLUTE): 0.1 10*3/uL (ref 0.0–0.4)
Eos: 1 %
HEMOGLOBIN: 9.4 g/dL — AB (ref 11.1–15.9)
Hematocrit: 31.6 % — ABNORMAL LOW (ref 34.0–46.6)
Immature Grans (Abs): 0 10*3/uL (ref 0.0–0.1)
Immature Granulocytes: 0 %
LYMPHS ABS: 0.6 10*3/uL — AB (ref 0.7–3.1)
LYMPHS: 9 %
MCH: 29.2 pg (ref 26.6–33.0)
MCHC: 29.7 g/dL — AB (ref 31.5–35.7)
MCV: 98 fL — ABNORMAL HIGH (ref 79–97)
MONOCYTES: 4 %
Monocytes Absolute: 0.2 10*3/uL (ref 0.1–0.9)
Neutrophils Absolute: 5.8 10*3/uL (ref 1.4–7.0)
Neutrophils: 86 %
PLATELETS: 157 10*3/uL (ref 150–379)
RBC: 3.22 x10E6/uL — AB (ref 3.77–5.28)
RDW: 14.7 % (ref 12.3–15.4)
WBC: 6.7 10*3/uL (ref 3.4–10.8)

## 2016-10-10 ENCOUNTER — Other Ambulatory Visit: Payer: Self-pay | Admitting: Family Medicine

## 2016-10-10 ENCOUNTER — Other Ambulatory Visit: Payer: Self-pay | Admitting: Physician Assistant

## 2016-10-12 ENCOUNTER — Telehealth: Payer: Self-pay | Admitting: *Deleted

## 2016-10-12 NOTE — Telephone Encounter (Signed)
please call patient: Needs f/u appt next available.

## 2016-10-12 NOTE — Telephone Encounter (Signed)
On Friday, Jul 13, 2016, we sent notice to you, requesting verification of continued prolia injections for this pt. As of today, we have not received a response and want to verify that prolia continues to be a recommended treatment for this pt. At this time, the patient is new, or six (6) months to one (1) year behind in receiving the prolia injection. Please reply to this message by Friday, Oct 19, 2016 at 5pm so that we may take the necessary steps to ensure continued care for your patient. Thank you

## 2016-10-15 NOTE — Telephone Encounter (Signed)
Attempted to reach the patient to discuss prolia and office visit. Patient was not available and did not have a working Advertising account executive. Will try again at a later time.

## 2016-10-16 ENCOUNTER — Telehealth: Payer: Self-pay | Admitting: Family Medicine

## 2016-10-16 MED ORDER — NYSTATIN 100000 UNIT/ML MT SUSP: 5.0000 mL | Freq: Four times a day (QID) | OROMUCOSAL | 0 refills | Status: AC

## 2016-10-16 NOTE — Telephone Encounter (Signed)
THIS MESSAGE IS FROM PATIENT'S SON (KENT Guardino) FOR DR. SHAW: HE WOULD LIKE TO KNOW IF DR. SHAW WILL TELL HIM HOW HIS MOTHER CAN BE TAKEN OFF OF THE EXPERIMENTAL MEDICINE SHE HAS BEEN GIVEN BY HER PULMONARY DOCTOR? HE SAID IT IS MAKING HIS MOTHER'S CONDITION WORSE. ALSO SHE HAS THRUSH IN HER MOUTH FROM ALL THE MEDICATIONS SHE IS TAKING. SHE WOULD LIKE TO HAVE SOMETHING CALLED INTO HER PHARMACY. BEST PHONE [phone] (HER SON'S NAME IS KENT) HE SAID IT IS HARD FOR HIS MOTHER TO TALK ON THE PHONE DUE TO HER BREATHING. HER SON BRYAN Dentinger IS ON HER 2018 HIPAA AND HIS PHONE IS [phone]. PHARMACY CHOICE IS [facility] ON [address]. [place]

## 2016-10-16 NOTE — Telephone Encounter (Signed)
Attempted to reach the patient this morning. Patient was not available and did not have a working Advertising account executive. Appointment letter mailed to the patient instructing her to schedule her appointment to discuss her prolia injection.

## 2016-10-16 NOTE — Telephone Encounter (Signed)
I have absolutely no idea about getting her off of that medication. I highly recommend he ask Cristina Parker's pulmonologist Dr. Chase Caller and/or her cardiologist Dr. Aundra Dubin about that.  I have sent in nystatin liquid to her pharmacy. Gargle and swish with 1 teaspoon. Hold in mouth for as long as possible before swallowing it down. Use 4 times a day until symptoms are completely gone and then use for 2 additional days before stopping.

## 2016-10-16 NOTE — Telephone Encounter (Signed)
Last seen by you 10/01/16

## 2016-10-18 NOTE — Telephone Encounter (Signed)
l/m with Dr Manya Silvas note

## 2016-10-19 ENCOUNTER — Encounter: Payer: Self-pay | Admitting: Internal Medicine

## 2016-10-19 ENCOUNTER — Ambulatory Visit (INDEPENDENT_AMBULATORY_CARE_PROVIDER_SITE_OTHER): Payer: Medicare Other | Admitting: Internal Medicine

## 2016-10-19 DIAGNOSIS — I272 Pulmonary hypertension, unspecified: Secondary | ICD-10-CM

## 2016-10-19 MED ORDER — POTASSIUM CHLORIDE ER 10 MEQ PO TBCR: 10.0000 meq | EXTENDED_RELEASE_TABLET | Freq: Every day | ORAL | 0 refills | Status: DC

## 2016-10-19 MED ORDER — FUROSEMIDE 20 MG PO TABS: 20.0000 mg | ORAL_TABLET | Freq: Every day | ORAL | 0 refills | Status: DC

## 2016-10-19 NOTE — Assessment & Plan Note (Signed)
Do not think opsumit is working  On other hand fluid build up might be due to opsumit though fluid build up can be due to pulmonary hypertension    PLAN - stop Opsumit  - start lasix 98m daily with potassium chloride 148m daily x 2 weeks - continue o2  Followup 7-10 days with APP for review; might need blood work that day

## 2016-10-19 NOTE — Progress Notes (Signed)
Subjective:     Patient ID: Cristina Parker, female   DOB: 06/04/1936, 81 y.o.   MRN: [id number]  HPI   OV 09/15/2015  Chief Complaint  Patient presents with  . Pulmonary Consult    MW pt here for second opinion for pulmonary fibrosis. Pt c/o DOE. Pt deneis cough, CP/tightness and wheezing. Pt wearing 2lpm pulsed.    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 She is a 35 pack smoking history in the past. In 2012 she had normal CT scan of the chest. Then for the last few years she's had insidious onset of dyspnea that has been progressive in the last 1 year. Since end of last year is been on 3 L oxygen with exertion 2 L at rest. She feels dyspnea is progressive. It is present on exertion relieved by rest. 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 She had limited autoimmune workup summer 2016 including ANA, rheumatoid factor and CCP antibody and these were normal. She was tried on a course of prednisone since September 2016 till early 2017 but this did not help. This I confirmed on chart review and talking to her.  She is with a friend Cristina Parker wife was also retired Marine scientist from the hospital.  Brewing technologist ILD questionnaire - Symptoms: She will occasionally coughs. It is not bothersome. She does not cough at night and the cough is dry. She has been dyspneic at a level IV-V which means that she is too breathless to leave the house and she is dyspneic when she dresses. She's been dyspneic for 1-2 years at this level - Past medical: She has heart disease hepatitis history of pleurisy and pneumonia acid reflux dysphagia sensitivity to light. 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 Denies any exposure to occupational lung disease -Personal exposure history: Denies taking  any street drugs but did smoke cigarettes from age 42, 1 pack a day and quit when she was 81 years old.  reports that she quit smoking about 19 years ago. Her smoking use included Cigarettes. She has a 35.00 pack-year smoking history. She has never used smokeless tobacco.  -Family history of lung disease: Denies -Environmental history: She lives in an old house. There is no humidifier exposure. No Jacuzzi. No birds. -   OV 11/10/2015  Chief Complaint  Patient presents with  . Follow-up    Review PFT. Using 2-3 liters O2 and at night. Denies any current breathing issues out of her normal range.     Follow dyspnea with the suspicion of interstitial lung disease in the setting of chronic hypoxemic respiratory failure.  She continues to use oxygen. She is here to review results. They're all listed below  Pulmonary function test today shows restriction with low diffusion although her lung capacity is normal. The severity of reduced diffusion is extreme while the severity of restriction as moderate on the spirometry but normal in the lung volumes. CT scan of the chest surprisingly does not show any interstitial lung disease but Echo shows basilar dysfunction grade 2 with significant pulmonary artery systolic pressure elevation at 60 mm. The medical referral to Dr. Aundra Dubin for right heart catheterization with this is pending. Autoimmune blood tests March 2017 is normal.  IMPRESSION: HRCT 4/617 1. Pulmonary parenchymal scarring in the  right hemi thorax. No definitive evidence of interstitial lung disease. 2. Right adrenal adenoma. 3. Mild emphysema centrilobular   Electronically Signed  By: Lorin Picket M.D.  On: 09/26/2015 08:27  Echo 11/03/15 shows Diast dysfhn grade 2 with significant PASP elevaltion 60s -       12/23/2015 Follow Up PAH   Pt. Presents to the office today for follow up. She had a right heart cath 8 days ago.She had elevated PAP, and was started on Opsumit by Dr.  Aundra Dubin. Her friend states that she has noted since starting this medication she has been less short of breath. Per the patient she states she is at baseline with her breathing. She wears her oxygen at 2L [place]. When entering the exam room she had saturations in the 60's. They rebounded easily with an increase in her oxygen to 2L.We spent some time discussing the need to maintain her oxygen saturations 88-92%. We also discussed using a saturation monitor to make sure her saturations are within range. She denies fever, chest pain, orthopnea, hemoptysis.She is continuing to use her Spiriva as prescribed. She does not have a rescue inhaler.  Tests and Procedures.  Right Heart Cath ( Diagnostic )  1. Borderline elevated PCWP and mildly elevated RA pressure.  2. Preserved cardiac output.  3. Moderate pulmonary arterial hypertension.   Patient has [place] . No definite rheumatological disease has been identified. Would plan treatment with selective pulmonary vasodilators, start with macitentan (will go ahead and start the paperwork as we complete her PAH workup). I will see in office for full PAH workup.   Right Heart Pressures RHC Procedural Findings: Hemodynamics (mmHg) RA mean 10 RV 70/14 PA 66/19, mean 41 PCWP mean 16 Oxygen saturations:  PA 57% AO 95%  Cardiac Output (Fick) 3.3  Cardiac Index (Fick) 2.2 PVR 7.6 WU  Cardiac Output (thermo) 4.16 Cardiac Index (thermo) 2.77  PVR 6 WU       OV 03/27/2016  Chief Complaint  Patient presents with  . Pulmonary Hypertension    Breathing is worse since last OV. Reports SOB. Wheezing at times. Denies chest tightness or coughing.    Follow-up chronic hypoxemic respiratory failure  I started seeing her a few months ago for evaluation of chronic hypoxemic respiratory failure. Initial suspicion was initially interstitial lung disease. However after workup we have discovered that she has mild centrilobular emphysema on CT associated with  right hemithorax pulmonary scarring. She also has pulmonary hypertension. Recently a few months ago she's been started on advance [facility] therapy. She returns for follow-up. Despite inhalers and PAH therapy for the last few months she is not any better. However she is stable. She uses 3-4 L of oxygen at rest. Is no change in her health status. She is mostly deconditioned. She sits at home hardly walks. This was a few feet. Does not even do her ADLs. She is not interested in pulmonary rehabilitation but is interested in home physical therapy.    OV 07/31/2016  Chief Complaint  Patient presents with  . Follow-up    Pt states her breathing is at baseline. Pt c/o dry cough. Pt denies CP/tightness.    Follow-up multifactorial chronic hypoxic respiratory failure  Routine visit. Last seen October 2017. She and her friend at test that she is better because of the pulmonary hypertension drug Macitentan. However objectively it seems this translates into less dyspnea when talking. She is deconditioned and does not walk much therefore this particular 6 minute walk test to document  objective improvement. She does not do pulmonary rehabilitation. She does not even do her activities of daily living. The only improvement she has notices improvement in dyspnea while talking. At the same time she tells me that she desaturates when she exerts herself to the bathroom and back. Parker ox drops into the 70s this is on room air. There are no new issues.  Parker oximetry 74% on room air at rest after being offf oxygen for 5-10 minutes  OV 10/19/2016  Chief Complaint  Patient presents with  . Acute Visit    pt c/o increased b/l leg swelling, nonprod cough, increased sob with any exertion, weakness.  s/s present X2 weeks. Also notes thrush.    Follow-up multifactorial chronic hypoxemic respiratory failure. This is an acute visit. She has a new problem of pedal edema for the last few months several weeks. She feels that her  body weight gain. She believes this is due to pulmonary hypertension drug Macitentan. Her son is here with her and he test to the same. She also believes that drug is not helping her with her dyspnea and hypoxemia. She wants to come off it. There is some associated cough with white mucus but does not sound like bronchitis.      has a past medical history of Anemia; Anxiety; Arthritis; Blood transfusion without reported diagnosis; Cataract; Centrilobular emphysema ([place]); Collagenous colitis; Crohn's disease ([place]); Depression; Duodenal stricture; Duodenal ulcer; Esophageal reflux; Heart disease; Heart murmur; Hiatal hernia; Hyperlipemia; Hypertension; Osteoporosis; Paroxysmal A-fib ([place]) (07/09/2011); Peripheral vascular disease ([place]); Pneumonia; and Pulmonary hypertension ([place]).   reports that she quit smoking about 20 years ago. Her smoking use included Cigarettes. She has a 35.00 pack-year smoking history. She has never used smokeless tobacco.  Past Surgical History:  Procedure Laterality Date  . ABDOMINAL HYSTERECTOMY    . 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 BUNIONECTOMY  1977   both feet   . CARDIAC CATHETERIZATION N/A 12/08/2015   Procedure: Right Heart Cath;  Surgeon: Larey Dresser, MD;  Location: [facility];  Service: Cardiovascular;  Laterality: N/A;  . CAROTID ENDARTERECTOMY  05-1998   Left CEA  . CAROTID ENDARTERECTOMY  04/11/11   Right CEA  . CERVICAL DISC SURGERY    . CESAREAN SECTION    . COLONOSCOPY    . CORONARY ARTERY BYPASS GRAFT    . EYE SURGERY  10/2010   left eye cataract  . FRACTURE SURGERY  1998   ORIF  . HIP SURGERY     right  . TUBAL LIGATION      No Known Allergies  Immunization History  Administered Date(s) Administered  . Hepatitis B 06/18/2001  . Influenza Split 03/19/2011, 02/28/2012  . Influenza, High Dose Seasonal PF 03/27/2016  . Influenza,inj,Quad PF,36+ Mos 03/13/2013, 04/09/2014, 03/09/2015  .  Pneumococcal Conjugate-13 05/21/2014  . Pneumococcal Polysaccharide-23 06/22/2004  . Td 02/02/2010  . Zoster 06/18/1998    Family History  Problem Relation Age of Onset  . Colon polyps Father   . Heart disease Father     Before age 47  . Stroke Father   . Deep vein thrombosis Father   . Vascular Disease Brother   . Alcoholism Paternal Grandfather   . Heart disease Paternal Grandmother   . Hypertension Mother   . Varicose Veins Mother   . Peripheral vascular disease Mother   . Osteoporosis Mother   . Heart disease Maternal Grandmother   . Colon cancer  Neg Hx   . Esophageal cancer Neg Hx   . Rectal cancer Neg Hx   . Stomach cancer Neg Hx      Current Outpatient Prescriptions:  .  Ascorbic Acid (VITAMIN C) 1000 MG tablet, Take 1,000 mg by mouth daily at 12 noon. , Disp: , Rfl:  .  aspirin 81 MG tablet, Take 81 mg by mouth every morning. , Disp: , Rfl:  .  aspirin-acetaminophen-caffeine (EXCEDRIN MIGRAINE) 250-250-65 MG tablet, Take 1 tablet by mouth 2 (two) times daily., Disp: , Rfl:  .  atorvastatin (LIPITOR) 40 MG tablet, Take 1 tablet (40 mg total) by mouth daily. (Patient taking differently: Take 40 mg by mouth every morning. ), Disp: 90 tablet, Rfl: 3 .  BREO ELLIPTA 100-25 MCG/INH AEPB, INHALE 1 PUFF INTO THE LUNGS DAILY, Disp: 30 each, Rfl: 2 .  calcium carbonate (OS-CAL - DOSED IN MG OF ELEMENTAL CALCIUM) 1250 (500 Ca) MG tablet, Take 1 tablet by mouth daily with breakfast., Disp: , Rfl:  .  clopidogrel (PLAVIX) 75 MG tablet, TAKE 1 TABLET BY MOUTH EVERY NIGHT AT BEDTIME, Disp: 90 tablet, Rfl: 1 .  escitalopram (LEXAPRO) 20 MG tablet, TAKE 1 TABLET(20 MG) BY MOUTH DAILY, Disp: 90 tablet, Rfl: 1 .  feeding supplement, ENSURE ENLIVE, (ENSURE ENLIVE) LIQD, Take 237 mLs by mouth 2 (two) times daily between meals., Disp: 237 mL, Rfl: 12 .  HYDROcodone-acetaminophen (NORCO) 10-325 MG tablet, Take 1-2 tablets by mouth every 6 (six) hours as needed., Disp: 240 tablet, Rfl: 0 .   LORazepam (ATIVAN) 0.5 MG tablet, Take 0.5 tablets (0.25 mg total) by mouth 2 (two) times daily., Disp: 30 tablet, Rfl: 3 .  LUMIGAN 0.01 % SOLN, Apply 1 drop in both eyes every night, Disp: , Rfl:  .  Macitentan (OPSUMIT) 10 MG TABS, Take 10 mg by mouth daily., Disp: , Rfl:  .  metoprolol succinate (TOPROL-XL) 50 MG 24 hr tablet, Take 1 tablet (50 mg total) by mouth daily. with or immediately following a meal., Disp: 90 tablet, Rfl: 3 .  Multiple Vitamin (MULTIVITAMIN WITH MINERALS) TABS tablet, Take 1 tablet by mouth daily., Disp: , Rfl:  .  OXYGEN, Inhale 2-3 L into the lungs daily. Oxygen 2lpm with rest and 3 lpm with exertion, Disp: , Rfl:  .  pantoprazole (PROTONIX) 40 MG tablet, Take 40 mg by mouth every morning. , Disp: , Rfl:  .  nystatin (MYCOSTATIN) 100000 UNIT/ML suspension, Take 5 mLs (500,000 Units total) by mouth 4 (four) times daily. (Patient not taking: Reported on 10/19/2016), Disp: 200 mL, Rfl: 0   Review of Systems     Objective:   Physical Exam  Constitutional: She is oriented to person, place, and time. She appears well-developed and well-nourished. No distress.  Frail Sitting in wheel chair  HENT:  Head: Normocephalic and atraumatic.  Right Ear: External ear normal.  Left Ear: External ear normal.  Mouth/Throat: Oropharynx is clear and moist. No oropharyngeal exudate.  Eyes: Conjunctivae and EOM are normal. Pupils are equal, round, and reactive to light. Right eye exhibits no discharge. Left eye exhibits no discharge. No scleral icterus.  Neck: Normal range of motion. Neck supple. No JVD present. No tracheal deviation present. No thyromegaly present.  Cardiovascular: Normal rate, regular rhythm, normal heart sounds and intact distal pulses.  Exam reveals no gallop and no friction rub.   No murmur heard. Pulmonary/Chest: Effort normal and breath sounds normal. No respiratory distress. She has no wheezes. She has no rales.  She exhibits no tenderness.  Abdominal: Soft.  Bowel sounds are normal. She exhibits no distension and no mass. There is no tenderness. There is no rebound and no guarding.  Musculoskeletal: Normal range of motion. She exhibits edema. She exhibits no tenderness.  3+ edema with dry skin  Lymphadenopathy:    She has no cervical adenopathy.  Neurological: She is alert and oriented to person, place, and time. She has normal reflexes. No cranial nerve deficit. She exhibits normal muscle tone. Coordination normal.  Skin: Skin is warm and dry. No rash noted. She is not diaphoretic. No erythema. No pallor.  Psychiatric: She has a normal mood and affect. Her behavior is normal. Judgment and thought content normal.  Vitals reviewed.  Vitals:   10/19/16 0934 10/19/16 0936  BP:  90/62  Parker: (!) 109   Temp: 98.1 F (36.7 C)   TempSrc: Oral   SpO2: 91%     Estimated body mass index is 24.84 kg/m as calculated from the following:   Height as of 10/01/16: 4' 11" (1.499 m).   Weight as of 10/01/16: 123 lb (55.8 kg).      Assessment:       ICD-9-CM ICD-10-CM   1. Pulmonary hypertension (HCC) 416.8 I27.20        Plan:     Pulmonary hypertension Do not think opsumit is working  On other hand fluid build up might be due to opsumit though fluid build up can be due to pulmonary hypertension    PLAN - stop Opsumit  - start lasix 75m daily with potassium chloride 144m daily x 2 weeks - continue o2  Followup 7-10 days with APP for review; might need blood work that day   Dr. MuBrand MalesM.D., F.[facility].P Pulmonary and Critical Care Medicine Staff Physician CoWoodburyulmonary and Critical Care Pager: [phone] no answer or between  15:00h - 7:00h: call [phone]  10/19/2016 10:15 AM

## 2016-10-19 NOTE — Patient Instructions (Signed)
Pulmonary hypertension Do not think opsumit is working  On other hand fluid build up might be due to opsumit though fluid build up can be due to pulmonary hypertension    PLAN - stop Opsumit  - start lasix 68m daily with potassium chloride 130m daily x 2 weeks - continue o2  Followup 7-10 days with APP for review; might need blood work that day

## 2016-10-29 ENCOUNTER — Ambulatory Visit: Payer: Medicare Other | Admitting: Acute Care

## 2016-10-31 ENCOUNTER — Encounter: Payer: Self-pay | Admitting: Acute Care

## 2016-10-31 ENCOUNTER — Ambulatory Visit (INDEPENDENT_AMBULATORY_CARE_PROVIDER_SITE_OTHER): Payer: Medicare Other | Admitting: Acute Care

## 2016-10-31 ENCOUNTER — Other Ambulatory Visit (INDEPENDENT_AMBULATORY_CARE_PROVIDER_SITE_OTHER): Payer: Medicare Other

## 2016-10-31 VITALS — BP 102/72 | HR 90 | Ht 59.0 in | Wt 123.6 lb

## 2016-10-31 DIAGNOSIS — I272 Pulmonary hypertension, unspecified: Secondary | ICD-10-CM | POA: Diagnosis not present

## 2016-10-31 DIAGNOSIS — Z5181 Encounter for therapeutic drug level monitoring: Secondary | ICD-10-CM

## 2016-10-31 DIAGNOSIS — E785 Hyperlipidemia, unspecified: Secondary | ICD-10-CM

## 2016-10-31 DIAGNOSIS — J9621 Acute and chronic respiratory failure with hypoxia: Secondary | ICD-10-CM | POA: Diagnosis not present

## 2016-10-31 LAB — BASIC METABOLIC PANEL
BUN: 23 mg/dL (ref 6–23)
CHLORIDE: 92 meq/L — AB (ref 96–112)
CO2: 41 meq/L — AB (ref 19–32)
Calcium: 9 mg/dL (ref 8.4–10.5)
Creatinine, Ser: 0.66 mg/dL (ref 0.40–1.20)
GFR: 91.74 mL/min (ref >60.00)
Glucose, Bld: 111 mg/dL — ABNORMAL HIGH (ref 70–99)
Potassium: 4.2 mEq/L (ref 3.5–5.1)
SODIUM: 141 meq/L (ref 135–145)

## 2016-10-31 NOTE — Assessment & Plan Note (Signed)
Continue wearing oxygen at 5-6 L Goal is to maintain saturations greater than 88%

## 2016-10-31 NOTE — Patient Instructions (Addendum)
We will check labs today. (BMET) We will call with the results tomorrow. Continue Lasix 20 mg daily  Continue the potassium 10 MEq daily Referral to Cardiology for follow  New edema. ( She is [facility] by [facility]) Follow up with Dr. Chase Caller in 2 months. BMET in 4 weeks. Continue wearing oxygen at 5-6 L as you have been doing. Remain off the Opsumit for now Please contact office for sooner follow up if symptoms do not improve or worsen or seek emergency care

## 2016-10-31 NOTE — Progress Notes (Signed)
History of Present Illness Cristina Parker is a 81 y.o. female with Pulmonary Hypertension, currently on Opsumit therapy.. She is followed by Dr. Chase Caller.   10/31/2016 Follow up Pt. Presents for follow up. She was seen by  Dr. Chase Caller on 10/19/2016 for worsening shortness of breath. She also had swelling of both legs at that time. She states she has never had any swelling of her ankles or legs in the past, so this was new. Dr. Chase Caller had concerns that the Esbriet was not effective.. She has done well on the Lasix over the past 2 weeks. Swelling is much better, still greater in the left leg than the right.Dyspnea is less. Per her son who is with her today, the patient is much better.Oxygen saturations are 91% on 3L pulsed oxygen.She continues to wear oxygen at 5-6 L at home.  Test Results:  BMET 10/31/2016  Ref. Range 10/31/2016 17:03  Sodium Latest Ref Range: 135 - 145 mEq/L 141  Potassium Latest Ref Range: 3.5 - 5.1 mEq/L 4.2  Chloride Latest Ref Range: 96 - 112 mEq/L 92 (L)  CO2 Latest Ref Range: 19 - 32 mEq/L 41 (H)  Glucose Latest Ref Range: 70 - 99 mg/dL 111 (H)  BUN Latest Ref Range: 6 - 23 mg/dL 23  Creatinine Latest Ref Range: 0.40 - 1.20 mg/dL 0.66  Calcium Latest Ref Range: 8.4 - 10.5 mg/dL 9.0  GFR Latest Ref Range: >60.00 mL/min 91.74    CBC Latest Ref Rng & Units 10/01/2016 06/28/2016 06/22/2016  WBC 3.4 - 10.8 x10E3/uL 6.7 8.5 7.4  Hemoglobin 12.2 - 16.2 g/dL - 10.2(A) 9.1(L)  Hematocrit 34.0 - 46.6 % 31.6(L) 31.6(A) 30.5(L)  Platelets 150 - 379 x10E3/uL 157 - 159    BMP Latest Ref Rng & Units 10/31/2016 10/01/2016 06/28/2016  Glucose 70 - 99 mg/dL 111(H) 103(H) 78  BUN 6 - 23 mg/dL _0 Creatinine 0.40 - 1.20 mg/dL 0.66 0.70 0.57  BUN/Creat Ratio 12 - 28 - 33(H) 19  Sodium 135 - 145 mEq/L 141 145(H) 143  Potassium 3.5 - 5.1 mEq/L 4.2 4.1 5.3(H)  Chloride 96 - 112 mEq/L 92(L) 101 95(L)  CO2 19 - 32 mEq/L 41(H) 30(H) 30(H)  Calcium 8.4 - 10.5 mg/dL 9.0 8.8 8.9      BNP    Component Value Date/Time   BNP 742.3 (H) 06/19/2016 0536    ProBNP    Component Value Date/Time   PROBNP 2,686.0 (H) 07/03/2011 1643    PFT    Component Value Date/Time   FEV1PRE 0.99 11/10/2015 1245   FEV1POST 1.00 11/10/2015 1245   FVCPRE 1.21 11/10/2015 1245   FVCPOST 1.22 11/10/2015 1245   TLC 4.18 11/10/2015 1245   DLCOUNC 4.49 11/10/2015 1245   PREFEV1FVCRT 82 11/10/2015 1245   PSTFEV1FVCRT 82 11/10/2015 1245    No results found.   Past medical hx Past Medical History:  Diagnosis Date  . Anemia   . Anxiety   . Arthritis   . Blood transfusion without reported diagnosis   . Cataract    BILATERAL  . Centrilobular emphysema ([place])   . Collagenous colitis   . Crohn's disease ([place])   . Depression   . Duodenal stricture   . Duodenal ulcer   . Esophageal reflux   . Heart disease   . Heart murmur   . Hiatal hernia   . Hyperlipemia   . Hypertension   . Osteoporosis   . Paroxysmal A-fib ([place]) 07/09/2011  . Peripheral vascular  disease ([place])   . Pneumonia   . Pulmonary hypertension ([place])      Social History  Substance Use Topics  . Smoking status: Former Smoker    Packs/day: 1.00    Years: 35.00    Types: Cigarettes    Quit date: 06/18/1996  . Smokeless tobacco: Never Used  . Alcohol use 4.2 - 8.4 oz/week    7 - 14 Standard drinks or equivalent per week     Comment: WINE    Tobacco Cessation: Patient is a former smoker, quit date 1998. 35 pack year smoking history.  Past surgical hx, Family hx, Social hx all reviewed.  Current Outpatient Prescriptions on File Prior to Visit  Medication Sig  . Ascorbic Acid (VITAMIN C) 1000 MG tablet Take 1,000 mg by mouth daily at 12 noon.   Marland Kitchen aspirin 81 MG tablet Take 81 mg by mouth every morning.   Marland Kitchen aspirin-acetaminophen-caffeine (EXCEDRIN MIGRAINE) 250-250-65 MG tablet Take 1 tablet by mouth 2 (two) times daily.  Marland Kitchen atorvastatin (LIPITOR) 40 MG tablet Take 1 tablet (40 mg total) by mouth daily.  (Patient taking differently: Take 40 mg by mouth every morning. )  . BREO ELLIPTA 100-25 MCG/INH AEPB INHALE 1 PUFF INTO THE LUNGS DAILY  . calcium carbonate (OS-CAL - DOSED IN MG OF ELEMENTAL CALCIUM) 1250 (500 Ca) MG tablet Take 1 tablet by mouth daily with breakfast.  . clopidogrel (PLAVIX) 75 MG tablet TAKE 1 TABLET BY MOUTH EVERY NIGHT AT BEDTIME  . escitalopram (LEXAPRO) 20 MG tablet TAKE 1 TABLET(20 MG) BY MOUTH DAILY  . feeding supplement, ENSURE ENLIVE, (ENSURE ENLIVE) LIQD Take 237 mLs by mouth 2 (two) times daily between meals.  . furosemide (LASIX) 20 MG tablet Take 1 tablet (20 mg total) by mouth daily.  Marland Kitchen HYDROcodone-acetaminophen (NORCO) 10-325 MG tablet Take 1-2 tablets by mouth every 6 (six) hours as needed.  Marland Kitchen LORazepam (ATIVAN) 0.5 MG tablet Take 0.5 tablets (0.25 mg total) by mouth 2 (two) times daily.  Marland Kitchen LUMIGAN 0.01 % SOLN Apply 1 drop in both eyes every night  . Macitentan (OPSUMIT) 10 MG TABS Take 10 mg by mouth daily.  . metoprolol succinate (TOPROL-XL) 50 MG 24 hr tablet Take 1 tablet (50 mg total) by mouth daily. with or immediately following a meal.  . Multiple Vitamin (MULTIVITAMIN WITH MINERALS) TABS tablet Take 1 tablet by mouth daily.  Marland Kitchen nystatin (MYCOSTATIN) 100000 UNIT/ML suspension Take 5 mLs (500,000 Units total) by mouth 4 (four) times daily.  . OXYGEN Inhale 2-3 L into the lungs daily. Oxygen 2lpm with rest and 3 lpm with exertion  . pantoprazole (PROTONIX) 40 MG tablet Take 40 mg by mouth every morning.   . potassium chloride (K-DUR) 10 MEQ tablet Take 1 tablet (10 mEq total) by mouth daily.   No current facility-administered medications on file prior to visit.      No Known Allergies  Review Of Systems:  Constitutional:   No  weight loss, night sweats,  Fevers, chills, fatigue, or  lassitude.  HEENT:   No headaches,  Difficulty swallowing,  Tooth/dental problems, or  Sore throat,                No sneezing, itching, ear ache, nasal congestion, post  nasal drip,   CV:  No chest pain,  Orthopnea, PND, +swelling in lower extremities L >R,no anasarca, dizziness, palpitations, syncope.   GI  No heartburn, indigestion, abdominal pain, nausea, vomiting, diarrhea, change in bowel habits, loss of appetite,  bloody stools.   Resp: + shortness of breath with exertion less at rest. ( Better than it was )  No excess mucus, no productive cough,  No non-productive cough,  No coughing up of blood.  No change in color of mucus.  No wheezing.  No chest wall deformity  Skin: no rash or lesions.  GU: no dysuria, change in color of urine, no urgency or frequency.  No flank pain, no hematuria   MS:  No joint pain or swelling.  No decreased range of motion.  No back pain.  Psych:  No change in mood or affect. No depression or anxiety.  No memory loss.   Vital Signs BP 102/72 (BP Location: Right Arm, Cuff Size: Normal)   Pulse 90   Ht _0  (1.499 m)   Wt 123 lb 9.6 oz (56.1 kg)   SpO2 91%   BMI 24.96 kg/m    Physical Exam:  General- No distress,  A&Ox3, pleasant elderly female in a wheelchair ENT: No sinus tenderness, TM clear, pale nasal mucosa, no oral exudate,no post nasal drip, no LAN Cardiac: S1, S2, regular rate and rhythm, no murmur Chest: No wheeze/ rales/ dullness; no accessory muscle use, no nasal flaring, no sternal retractions, diminished per bases. Abd.: Soft Non-tender, non distended, bowel sounds positive Ext: No clubbing cyanosis, 1+ edema LLE, Trace RLE Neuro:  Deconditioned at baseline, MAE x 4, A&O x 3, mentally sharp Skin: No rashes, warm and dry Psych: normal mood and behavior   Assessment/Plan  Pulmonary hypertension Improvement of dyspnea with lasix Plan We will check labs today. (BMET) We will call with the results tomorrow. Continue Lasix 20 mg daily  Continue the potassium 10 MEq daily Referral to Cardiology for follow  New edema. >> She is due for her annual check up per patient Follow up with Dr. Chase Caller  in 2 months. BMET in 4 weeks. Continue wearing oxygen at 5-6 L as you have been doing. Remain off the Opsumit for now Please contact office for sooner follow up if symptoms do not improve or worsen or seek emergency care      Acute on chronic respiratory failure with hypoxia (HCC) Continue wearing oxygen at 5-6 L Goal is to maintain saturations greater than 88%    Magdalen Spatz, NP 10/31/2016  11:40 PM

## 2016-10-31 NOTE — Assessment & Plan Note (Signed)
Improvement of dyspnea with lasix Plan We will check labs today. (BMET) We will call with the results tomorrow. Continue Lasix 20 mg daily  Continue the potassium 10 MEq daily Referral to Cardiology for follow  New edema. >> She is due for her annual check up per patient Follow up with Dr. Chase Caller in 2 months. BMET in 4 weeks. Continue wearing oxygen at 5-6 L as you have been doing. Remain off the Opsumit for now Please contact office for sooner follow up if symptoms do not improve or worsen or seek emergency care

## 2016-11-01 ENCOUNTER — Telehealth (HOSPITAL_COMMUNITY): Payer: Self-pay | Admitting: *Deleted

## 2016-11-01 NOTE — Telephone Encounter (Signed)
[facility] called to confirm pt's Opsumit had been d/c'd.  Per chart pulm d/c'd med 5/4 and on 5/16 she was told to stay off med.  [facility] aware

## 2016-11-02 ENCOUNTER — Telehealth: Payer: Self-pay | Admitting: Acute Care

## 2016-11-02 NOTE — Telephone Encounter (Signed)
I saw Ms. Troxler on Wednesday. You had started her on lasix 20 mg daily with 10 MEq K for worsening edema. She is better. BMET was as below when I checked it Wednesday. Are you ok with leaving her on the Lasix? Please let me know if you want to keep the dose the same or decrease it or stop it.. Thanks. Results for Cristina Parker, SCOLES (MRN [id number]) as of 11/02/2016 16:42  10/31/2016 17:03 Sodium: 141 Potassium: 4.2 Chloride: 92 (L) CO2: 41 (H) Glucose: 111 (H) BUN: 23 Creatinine: 0.66 Calcium: 9.0 GFR: 91.74

## 2016-11-07 NOTE — Telephone Encounter (Signed)
Pt aware of rec's per MR. Will send to SG as FYI that this has been taken care of.  Anything further needed?

## 2016-11-07 NOTE — Telephone Encounter (Signed)
We can taper her to lasix 37m with 124m kcl on MWF  Dr. MuBrand MalesM.D., F.[facility].P Pulmonary and Critical Care Medicine Staff Physician CoPlantersvilleulmonary and Critical Care Pager: [phone] no answer or between  15:00h - 7:00h: call [phone]  11/07/2016 3:52 PM

## 2016-11-13 MED ORDER — POTASSIUM CHLORIDE ER 10 MEQ PO TBCR: EXTENDED_RELEASE_TABLET | ORAL | 2 refills | Status: AC

## 2016-11-13 MED ORDER — FUROSEMIDE 20 MG PO TABS: ORAL_TABLET | ORAL | 2 refills | Status: AC

## 2016-11-13 NOTE — Telephone Encounter (Signed)
Pt calling to check on the status of refill for laxix that was too have been called in on Friday, pharm is telling her they haven't received in from [place], pt stating that legs are very swollen, please advise she can be reache @ [phone] this was to have [facility] on [address]

## 2016-11-13 NOTE — Telephone Encounter (Signed)
Spoke with pt, lasix was not called in to pharmacy.  rx has been called in with potassium.  Nothing further needed.

## 2016-11-18 ENCOUNTER — Inpatient Hospital Stay (HOSPITAL_COMMUNITY): Payer: Medicare Other

## 2016-11-18 ENCOUNTER — Emergency Department (HOSPITAL_COMMUNITY): Payer: Medicare Other

## 2016-11-18 ENCOUNTER — Encounter (HOSPITAL_COMMUNITY): Payer: Self-pay | Admitting: Emergency Medicine

## 2016-11-18 ENCOUNTER — Inpatient Hospital Stay (HOSPITAL_COMMUNITY)
Admission: EM | Admit: 2016-11-18 | Discharge: 2016-12-16 | DRG: 207 | Disposition: E | Payer: Medicare Other | Attending: Pulmonary Disease | Admitting: Pulmonary Disease

## 2016-11-18 DIAGNOSIS — Z7189 Other specified counseling: Secondary | ICD-10-CM

## 2016-11-18 DIAGNOSIS — R740 Nonspecific elevation of levels of transaminase and lactic acid dehydrogenase [LDH]: Secondary | ICD-10-CM

## 2016-11-18 DIAGNOSIS — G9341 Metabolic encephalopathy: Secondary | ICD-10-CM | POA: Diagnosis not present

## 2016-11-18 DIAGNOSIS — Z515 Encounter for palliative care: Secondary | ICD-10-CM

## 2016-11-18 DIAGNOSIS — R0602 Shortness of breath: Secondary | ICD-10-CM | POA: Diagnosis not present

## 2016-11-18 DIAGNOSIS — I248 Other forms of acute ischemic heart disease: Secondary | ICD-10-CM

## 2016-11-18 DIAGNOSIS — N289 Disorder of kidney and ureter, unspecified: Secondary | ICD-10-CM | POA: Diagnosis present

## 2016-11-18 DIAGNOSIS — Z8371 Family history of colonic polyps: Secondary | ICD-10-CM

## 2016-11-18 DIAGNOSIS — I4891 Unspecified atrial fibrillation: Secondary | ICD-10-CM | POA: Diagnosis not present

## 2016-11-18 DIAGNOSIS — I472 Ventricular tachycardia: Secondary | ICD-10-CM | POA: Diagnosis present

## 2016-11-18 DIAGNOSIS — E440 Moderate protein-calorie malnutrition: Secondary | ICD-10-CM | POA: Diagnosis not present

## 2016-11-18 DIAGNOSIS — R1310 Dysphagia, unspecified: Secondary | ICD-10-CM | POA: Diagnosis not present

## 2016-11-18 DIAGNOSIS — D6959 Other secondary thrombocytopenia: Secondary | ICD-10-CM | POA: Diagnosis not present

## 2016-11-18 DIAGNOSIS — K5090 Crohn's disease, unspecified, without complications: Secondary | ICD-10-CM | POA: Diagnosis not present

## 2016-11-18 DIAGNOSIS — Z452 Encounter for adjustment and management of vascular access device: Secondary | ICD-10-CM | POA: Diagnosis not present

## 2016-11-18 DIAGNOSIS — I5043 Acute on chronic combined systolic (congestive) and diastolic (congestive) heart failure: Secondary | ICD-10-CM | POA: Diagnosis present

## 2016-11-18 DIAGNOSIS — Z66 Do not resuscitate: Secondary | ICD-10-CM | POA: Diagnosis not present

## 2016-11-18 DIAGNOSIS — R739 Hyperglycemia, unspecified: Secondary | ICD-10-CM | POA: Diagnosis not present

## 2016-11-18 DIAGNOSIS — I2510 Atherosclerotic heart disease of native coronary artery without angina pectoris: Secondary | ICD-10-CM | POA: Diagnosis not present

## 2016-11-18 DIAGNOSIS — Z01818 Encounter for other preprocedural examination: Secondary | ICD-10-CM

## 2016-11-18 DIAGNOSIS — J432 Centrilobular emphysema: Secondary | ICD-10-CM | POA: Diagnosis present

## 2016-11-18 DIAGNOSIS — I130 Hypertensive heart and chronic kidney disease with heart failure and stage 1 through stage 4 chronic kidney disease, or unspecified chronic kidney disease: Secondary | ICD-10-CM | POA: Diagnosis not present

## 2016-11-18 DIAGNOSIS — E876 Hypokalemia: Secondary | ICD-10-CM | POA: Diagnosis not present

## 2016-11-18 DIAGNOSIS — Z90710 Acquired absence of both cervix and uterus: Secondary | ICD-10-CM

## 2016-11-18 DIAGNOSIS — K7200 Acute and subacute hepatic failure without coma: Secondary | ICD-10-CM | POA: Diagnosis present

## 2016-11-18 DIAGNOSIS — R570 Cardiogenic shock: Secondary | ICD-10-CM | POA: Diagnosis present

## 2016-11-18 DIAGNOSIS — F329 Major depressive disorder, single episode, unspecified: Secondary | ICD-10-CM | POA: Diagnosis present

## 2016-11-18 DIAGNOSIS — A419 Sepsis, unspecified organism: Secondary | ICD-10-CM

## 2016-11-18 DIAGNOSIS — J9600 Acute respiratory failure, unspecified whether with hypoxia or hypercapnia: Secondary | ICD-10-CM

## 2016-11-18 DIAGNOSIS — R0902 Hypoxemia: Secondary | ICD-10-CM | POA: Diagnosis not present

## 2016-11-18 DIAGNOSIS — R7989 Other specified abnormal findings of blood chemistry: Secondary | ICD-10-CM | POA: Diagnosis present

## 2016-11-18 DIAGNOSIS — N390 Urinary tract infection, site not specified: Secondary | ICD-10-CM | POA: Diagnosis not present

## 2016-11-18 DIAGNOSIS — R569 Unspecified convulsions: Secondary | ICD-10-CM | POA: Diagnosis present

## 2016-11-18 DIAGNOSIS — I340 Nonrheumatic mitral (valve) insufficiency: Secondary | ICD-10-CM | POA: Diagnosis not present

## 2016-11-18 DIAGNOSIS — Z8262 Family history of osteoporosis: Secondary | ICD-10-CM

## 2016-11-18 DIAGNOSIS — J13 Pneumonia due to Streptococcus pneumoniae: Secondary | ICD-10-CM | POA: Diagnosis not present

## 2016-11-18 DIAGNOSIS — J9620 Acute and chronic respiratory failure, unspecified whether with hypoxia or hypercapnia: Secondary | ICD-10-CM | POA: Diagnosis not present

## 2016-11-18 DIAGNOSIS — Z823 Family history of stroke: Secondary | ICD-10-CM

## 2016-11-18 DIAGNOSIS — T461X5A Adverse effect of calcium-channel blockers, initial encounter: Secondary | ICD-10-CM | POA: Diagnosis not present

## 2016-11-18 DIAGNOSIS — M1990 Unspecified osteoarthritis, unspecified site: Secondary | ICD-10-CM | POA: Diagnosis present

## 2016-11-18 DIAGNOSIS — T45515A Adverse effect of anticoagulants, initial encounter: Secondary | ICD-10-CM | POA: Diagnosis not present

## 2016-11-18 DIAGNOSIS — J90 Pleural effusion, not elsewhere classified: Secondary | ICD-10-CM

## 2016-11-18 DIAGNOSIS — Z951 Presence of aortocoronary bypass graft: Secondary | ICD-10-CM

## 2016-11-18 DIAGNOSIS — I952 Hypotension due to drugs: Secondary | ICD-10-CM | POA: Diagnosis not present

## 2016-11-18 DIAGNOSIS — I739 Peripheral vascular disease, unspecified: Secondary | ICD-10-CM

## 2016-11-18 DIAGNOSIS — R6521 Severe sepsis with septic shock: Secondary | ICD-10-CM | POA: Diagnosis not present

## 2016-11-18 DIAGNOSIS — F419 Anxiety disorder, unspecified: Secondary | ICD-10-CM | POA: Diagnosis present

## 2016-11-18 DIAGNOSIS — R34 Anuria and oliguria: Secondary | ICD-10-CM | POA: Diagnosis present

## 2016-11-18 DIAGNOSIS — Z8711 Personal history of peptic ulcer disease: Secondary | ICD-10-CM

## 2016-11-18 DIAGNOSIS — J9690 Respiratory failure, unspecified, unspecified whether with hypoxia or hypercapnia: Secondary | ICD-10-CM

## 2016-11-18 DIAGNOSIS — K819 Cholecystitis, unspecified: Secondary | ICD-10-CM | POA: Diagnosis present

## 2016-11-18 DIAGNOSIS — I2721 Secondary pulmonary arterial hypertension: Secondary | ICD-10-CM | POA: Diagnosis present

## 2016-11-18 DIAGNOSIS — I480 Paroxysmal atrial fibrillation: Secondary | ICD-10-CM | POA: Diagnosis not present

## 2016-11-18 DIAGNOSIS — J9621 Acute and chronic respiratory failure with hypoxia: Secondary | ICD-10-CM | POA: Diagnosis present

## 2016-11-18 DIAGNOSIS — E162 Hypoglycemia, unspecified: Secondary | ICD-10-CM | POA: Diagnosis present

## 2016-11-18 DIAGNOSIS — J849 Interstitial pulmonary disease, unspecified: Secondary | ICD-10-CM | POA: Diagnosis present

## 2016-11-18 DIAGNOSIS — Z8249 Family history of ischemic heart disease and other diseases of the circulatory system: Secondary | ICD-10-CM

## 2016-11-18 DIAGNOSIS — Z87891 Personal history of nicotine dependence: Secondary | ICD-10-CM | POA: Diagnosis not present

## 2016-11-18 DIAGNOSIS — D638 Anemia in other chronic diseases classified elsewhere: Secondary | ICD-10-CM | POA: Diagnosis present

## 2016-11-18 DIAGNOSIS — I5021 Acute systolic (congestive) heart failure: Secondary | ICD-10-CM | POA: Diagnosis not present

## 2016-11-18 DIAGNOSIS — E785 Hyperlipidemia, unspecified: Secondary | ICD-10-CM | POA: Diagnosis present

## 2016-11-18 DIAGNOSIS — N179 Acute kidney failure, unspecified: Secondary | ICD-10-CM | POA: Diagnosis not present

## 2016-11-18 DIAGNOSIS — K219 Gastro-esophageal reflux disease without esophagitis: Secondary | ICD-10-CM | POA: Diagnosis present

## 2016-11-18 DIAGNOSIS — B9561 Methicillin susceptible Staphylococcus aureus infection as the cause of diseases classified elsewhere: Secondary | ICD-10-CM | POA: Diagnosis present

## 2016-11-18 DIAGNOSIS — I708 Atherosclerosis of other arteries: Secondary | ICD-10-CM | POA: Diagnosis present

## 2016-11-18 DIAGNOSIS — Z9911 Dependence on respirator [ventilator] status: Secondary | ICD-10-CM

## 2016-11-18 DIAGNOSIS — Z79899 Other long term (current) drug therapy: Secondary | ICD-10-CM

## 2016-11-18 DIAGNOSIS — R7401 Elevation of levels of liver transaminase levels: Secondary | ICD-10-CM

## 2016-11-18 DIAGNOSIS — Z4682 Encounter for fitting and adjustment of non-vascular catheter: Secondary | ICD-10-CM | POA: Diagnosis not present

## 2016-11-18 DIAGNOSIS — Z7951 Long term (current) use of inhaled steroids: Secondary | ICD-10-CM

## 2016-11-18 DIAGNOSIS — N183 Chronic kidney disease, stage 3 (moderate): Secondary | ICD-10-CM | POA: Diagnosis present

## 2016-11-18 DIAGNOSIS — Z9981 Dependence on supplemental oxygen: Secondary | ICD-10-CM

## 2016-11-18 DIAGNOSIS — Z6824 Body mass index (BMI) 24.0-24.9, adult: Secondary | ICD-10-CM

## 2016-11-18 DIAGNOSIS — M810 Age-related osteoporosis without current pathological fracture: Secondary | ICD-10-CM | POA: Diagnosis present

## 2016-11-18 DIAGNOSIS — E875 Hyperkalemia: Secondary | ICD-10-CM | POA: Diagnosis present

## 2016-11-18 LAB — I-STAT ARTERIAL BLOOD GAS, ED
ACID-BASE DEFICIT: 3 mmol/L — AB (ref 0.0–2.0)
Acid-base deficit: 4 mmol/L — ABNORMAL HIGH (ref 0.0–2.0)
BICARBONATE: 25.6 mmol/L (ref 20.0–28.0)
Bicarbonate: 23.6 mmol/L (ref 20.0–28.0)
O2 Saturation: 55 %
O2 Saturation: 26 %
PH ART: 7.266 — AB (ref 7.350–7.450)
PH ART: 7.225 — AB (ref 7.350–7.450)
PO2 ART: 33 mmHg — AB (ref 83.0–108.0)
TCO2: 25 mmol/L (ref 0–100)
TCO2: 27 mmol/L (ref 0–100)
pCO2 arterial: 51.9 mmHg — ABNORMAL HIGH (ref 32.0–48.0)
pCO2 arterial: 61.8 mmHg — ABNORMAL HIGH (ref 32.0–48.0)
pO2, Arterial: 22 mmHg — CL (ref 83.0–108.0)

## 2016-11-18 LAB — BASIC METABOLIC PANEL
ANION GAP: 20 — AB (ref 5–15)
ANION GAP: 18 — AB (ref 5–15)
BUN: 32 mg/dL — ABNORMAL HIGH (ref 6–20)
BUN: 31 mg/dL — ABNORMAL HIGH (ref 6–20)
CALCIUM: 9.2 mg/dL (ref 8.9–10.3)
CHLORIDE: 100 mmol/L — AB (ref 101–111)
CO2: 23 mmol/L (ref 22–32)
CO2: 23 mmol/L (ref 22–32)
CREATININE: 1.07 mg/dL — AB (ref 0.44–1.00)
CREATININE: 1.23 mg/dL — AB (ref 0.44–1.00)
Calcium: 8.4 mg/dL — ABNORMAL LOW (ref 8.9–10.3)
Chloride: 98 mmol/L — ABNORMAL LOW (ref 101–111)
GFR calc non Af Amer: 41 mL/min — ABNORMAL LOW (ref >60)
GFR, EST AFRICAN AMERICAN: 56 mL/min — AB (ref >60)
GFR, EST AFRICAN AMERICAN: 47 mL/min — AB (ref >60)
GFR, EST NON AFRICAN AMERICAN: 48 mL/min — AB (ref >60)
Glucose, Bld: 87 mg/dL (ref 65–99)
Glucose, Bld: 126 mg/dL — ABNORMAL HIGH (ref 65–99)
POTASSIUM: 4.4 mmol/L (ref 3.5–5.1)
Potassium: 5.3 mmol/L — ABNORMAL HIGH (ref 3.5–5.1)
SODIUM: 141 mmol/L (ref 135–145)
Sodium: 141 mmol/L (ref 135–145)

## 2016-11-18 LAB — I-STAT TROPONIN, ED: TROPONIN I, POC: 0.46 ng/mL — AB (ref 0.00–0.08)

## 2016-11-18 LAB — HEPATIC FUNCTION PANEL
ALBUMIN: 3.4 g/dL — AB (ref 3.5–5.0)
ALK PHOS: 150 U/L — AB (ref 38–126)
ALT: 823 U/L — AB (ref 14–54)
AST: 1238 U/L — AB (ref 15–41)
BILIRUBIN DIRECT: 0.8 mg/dL — AB (ref 0.1–0.5)
BILIRUBIN TOTAL: 2.2 mg/dL — AB (ref 0.3–1.2)
Indirect Bilirubin: 1.4 mg/dL — ABNORMAL HIGH (ref 0.3–0.9)
Total Protein: 6.2 g/dL — ABNORMAL LOW (ref 6.5–8.1)

## 2016-11-18 LAB — URINALYSIS, ROUTINE W REFLEX MICROSCOPIC
Bilirubin Urine: NEGATIVE
GLUCOSE, UA: 50 mg/dL — AB
Ketones, ur: NEGATIVE mg/dL
Nitrite: NEGATIVE
PH: 5 (ref 5.0–8.0)
Protein, ur: >=300 mg/dL — AB
SPECIFIC GRAVITY, URINE: 1.019 (ref 1.005–1.030)

## 2016-11-18 LAB — MRSA PCR SCREENING: MRSA by PCR: NEGATIVE

## 2016-11-18 LAB — CBG MONITORING, ED
GLUCOSE-CAPILLARY: 201 mg/dL — AB (ref 65–99)
GLUCOSE-CAPILLARY: 42 mg/dL — AB (ref 65–99)
Glucose-Capillary: 112 mg/dL — ABNORMAL HIGH (ref 65–99)

## 2016-11-18 LAB — PROTIME-INR
INR: 1.93
PROTHROMBIN TIME: 22.3 s — AB (ref 11.4–15.2)

## 2016-11-18 LAB — PROCALCITONIN: PROCALCITONIN: 0.12 ng/mL

## 2016-11-18 LAB — LIPASE, BLOOD: LIPASE: 31 U/L (ref 11–51)

## 2016-11-18 LAB — CREATININE, SERUM
CREATININE: 1.25 mg/dL — AB (ref 0.44–1.00)
GFR calc Af Amer: 46 mL/min — ABNORMAL LOW (ref >60)
GFR, EST NON AFRICAN AMERICAN: 40 mL/min — AB (ref >60)

## 2016-11-18 LAB — GLUCOSE, CAPILLARY
GLUCOSE-CAPILLARY: 59 mg/dL — AB (ref 65–99)
GLUCOSE-CAPILLARY: 91 mg/dL (ref 65–99)

## 2016-11-18 LAB — CBC
HCT: 36.1 % (ref 36.0–46.0)
HEMOGLOBIN: 10.5 g/dL — AB (ref 12.0–15.0)
MCH: 29 pg (ref 26.0–34.0)
MCHC: 29.1 g/dL — ABNORMAL LOW (ref 30.0–36.0)
MCV: 99.7 fL (ref 78.0–100.0)
PLATELETS: 177 10*3/uL (ref 150–400)
RBC: 3.62 MIL/uL — AB (ref 3.87–5.11)
RDW: 16.1 % — ABNORMAL HIGH (ref 11.5–15.5)
WBC: 11.3 10*3/uL — ABNORMAL HIGH (ref 4.0–10.5)

## 2016-11-18 LAB — TROPONIN I
Troponin I: 0.9 ng/mL (ref <0.03)
Troponin I: 1.53 ng/mL (ref <0.03)

## 2016-11-18 LAB — AMMONIA: Ammonia: 63 umol/L — ABNORMAL HIGH (ref 9–35)

## 2016-11-18 LAB — BRAIN NATRIURETIC PEPTIDE: B Natriuretic Peptide: 1106.7 pg/mL — ABNORMAL HIGH (ref 0.0–100.0)

## 2016-11-18 MED ORDER — SODIUM CHLORIDE 0.9 % IV SOLN
0.0000 ug/min | INTRAVENOUS | Status: DC
  Administered 2016-11-18: 150 ug/min via INTRAVENOUS
  Administered 2016-11-19 (×2): 400 ug/min via INTRAVENOUS
  Administered 2016-11-19: 350 ug/min via INTRAVENOUS
  Administered 2016-11-19: 175 ug/min via INTRAVENOUS
  Administered 2016-11-19 (×3): 400 ug/min via INTRAVENOUS
  Filled 2016-11-18 (×9): qty 4

## 2016-11-18 MED ORDER — ESCITALOPRAM OXALATE 10 MG PO TABS: 10.0000 mg | ORAL_TABLET | Freq: Every day | ORAL | Status: DC

## 2016-11-18 MED ORDER — ETOMIDATE 2 MG/ML IV SOLN
INTRAVENOUS | Status: AC | PRN
  Administered 2016-11-18: 20 mg via INTRAVENOUS

## 2016-11-18 MED ORDER — HEPARIN SODIUM (PORCINE) 5000 UNIT/ML IJ SOLN: 5000.0000 [IU] | Freq: Three times a day (TID) | INTRAMUSCULAR | Status: DC

## 2016-11-18 MED ORDER — APIXABAN 2.5 MG PO TABS
2.5000 mg | ORAL_TABLET | Freq: Two times a day (BID) | ORAL | Status: DC
  Administered 2016-11-18: 2.5 mg via ORAL
  Filled 2016-11-18 (×2): qty 1

## 2016-11-18 MED ORDER — ORAL CARE MOUTH RINSE
15.0000 mL | Freq: Four times a day (QID) | OROMUCOSAL | Status: DC
  Administered 2016-11-19 (×2): 15 mL via OROMUCOSAL

## 2016-11-18 MED ORDER — FENTANYL CITRATE (PF) 100 MCG/2ML IJ SOLN
INTRAMUSCULAR | Status: AC
  Filled 2016-11-18: qty 2

## 2016-11-18 MED ORDER — DEXTROSE 50 % IV SOLN
25.0000 mL | Freq: Once | INTRAVENOUS | Status: AC
  Administered 2016-11-18: 25 mL via INTRAVENOUS
  Filled 2016-11-18: qty 50

## 2016-11-18 MED ORDER — SODIUM CHLORIDE 0.9% FLUSH
3.0000 mL | Freq: Two times a day (BID) | INTRAVENOUS | Status: DC
  Administered 2016-11-18 – 2016-11-23 (×4): 3 mL via INTRAVENOUS

## 2016-11-18 MED ORDER — METOPROLOL TARTRATE 25 MG PO TABS
25.0000 mg | ORAL_TABLET | Freq: Two times a day (BID) | ORAL | Status: DC
  Administered 2016-11-18: 25 mg via ORAL
  Filled 2016-11-18: qty 1

## 2016-11-18 MED ORDER — EPINEPHRINE PF 1 MG/10ML IJ SOSY
PREFILLED_SYRINGE | INTRAMUSCULAR | Status: AC | PRN
  Administered 2016-11-18: 1 mg via INTRAVENOUS

## 2016-11-18 MED ORDER — SODIUM CHLORIDE 0.9 % IV SOLN: 250.0000 mL | INTRAVENOUS | Status: DC | PRN

## 2016-11-18 MED ORDER — MIDAZOLAM HCL 2 MG/2ML IJ SOLN
1.0000 mg | INTRAMUSCULAR | Status: DC | PRN
  Administered 2016-11-18 – 2016-11-19 (×2): 1 mg via INTRAVENOUS
  Filled 2016-11-18 (×2): qty 2

## 2016-11-18 MED ORDER — FAMOTIDINE 40 MG/5ML PO SUSR
20.0000 mg | Freq: Every day | ORAL | Status: DC
  Administered 2016-11-18: 20 mg
  Filled 2016-11-18: qty 2.5

## 2016-11-18 MED ORDER — CHLORHEXIDINE GLUCONATE 0.12% ORAL RINSE (MEDLINE KIT)
15.0000 mL | Freq: Two times a day (BID) | OROMUCOSAL | Status: DC
  Administered 2016-11-18 – 2016-11-19 (×2): 15 mL via OROMUCOSAL

## 2016-11-18 MED ORDER — ONDANSETRON HCL 4 MG/2ML IJ SOLN: 4.0000 mg | Freq: Four times a day (QID) | INTRAMUSCULAR | Status: DC | PRN

## 2016-11-18 MED ORDER — METOPROLOL TARTRATE 5 MG/5ML IV SOLN
2.5000 mg | Freq: Once | INTRAVENOUS | Status: AC
  Administered 2016-11-18: 2.5 mg via INTRAVENOUS

## 2016-11-18 MED ORDER — SUCCINYLCHOLINE CHLORIDE 20 MG/ML IJ SOLN
INTRAMUSCULAR | Status: AC | PRN
  Administered 2016-11-18: 100 mg via INTRAVENOUS

## 2016-11-18 MED ORDER — NOREPINEPHRINE BITARTRATE 1 MG/ML IV SOLN
0.0000 ug/min | Freq: Once | INTRAVENOUS | Status: AC
  Administered 2016-11-18: 2 ug/min via INTRAVENOUS

## 2016-11-18 MED ORDER — MIDAZOLAM HCL 2 MG/2ML IJ SOLN
1.0000 mg | INTRAMUSCULAR | Status: DC | PRN
  Administered 2016-11-18 – 2016-11-22 (×14): 1 mg via INTRAVENOUS
  Filled 2016-11-18 (×14): qty 2

## 2016-11-18 MED ORDER — ESCITALOPRAM OXALATE 10 MG PO TABS
20.0000 mg | ORAL_TABLET | Freq: Every day | ORAL | Status: DC
  Administered 2016-11-19: 20 mg
  Filled 2016-11-18: qty 2

## 2016-11-18 MED ORDER — MACITENTAN 10 MG PO TABS: 10.0000 mg | ORAL_TABLET | Freq: Every day | ORAL | Status: DC

## 2016-11-18 MED ORDER — ONDANSETRON HCL 4 MG PO TABS: 4.0000 mg | ORAL_TABLET | Freq: Four times a day (QID) | ORAL | Status: DC | PRN

## 2016-11-18 MED ORDER — SODIUM CHLORIDE 0.9 % IV BOLUS (SEPSIS)
500.0000 mL | Freq: Once | INTRAVENOUS | Status: AC
  Administered 2016-11-18: 500 mL via INTRAVENOUS

## 2016-11-18 MED ORDER — PANTOPRAZOLE SODIUM 40 MG PO TBEC: 40.0000 mg | DELAYED_RELEASE_TABLET | Freq: Every day | ORAL | Status: DC

## 2016-11-18 MED ORDER — FENTANYL CITRATE (PF) 100 MCG/2ML IJ SOLN
50.0000 ug | INTRAMUSCULAR | Status: DC | PRN
  Administered 2016-11-18 – 2016-11-23 (×16): 50 ug via INTRAVENOUS
  Filled 2016-11-18 (×15): qty 2

## 2016-11-18 MED ORDER — ONDANSETRON HCL 4 MG/2ML IJ SOLN
4.0000 mg | Freq: Four times a day (QID) | INTRAMUSCULAR | Status: DC | PRN
  Administered 2016-11-24: 4 mg via INTRAVENOUS
  Filled 2016-11-18: qty 2

## 2016-11-18 MED ORDER — ATORVASTATIN CALCIUM 40 MG PO TABS: 40.0000 mg | ORAL_TABLET | Freq: Every day | ORAL | Status: DC

## 2016-11-18 MED ORDER — DILTIAZEM HCL 100 MG IV SOLR
5.0000 mg/h | Freq: Once | INTRAVENOUS | Status: AC
  Administered 2016-11-18: 5 mg/h via INTRAVENOUS
  Filled 2016-11-18: qty 100

## 2016-11-18 MED ORDER — METOCLOPRAMIDE HCL 5 MG/ML IJ SOLN: 2.5000 mg | Freq: Once | INTRAMUSCULAR | Status: DC

## 2016-11-18 MED ORDER — APIXABAN 2.5 MG PO TABS
2.5000 mg | ORAL_TABLET | Freq: Once | ORAL | Status: DC
  Filled 2016-11-18: qty 1

## 2016-11-18 MED ORDER — DEXTROSE 50 % IV SOLN
INTRAVENOUS | Status: AC
  Administered 2016-11-18: 13:00:00
  Filled 2016-11-18: qty 50

## 2016-11-18 MED ORDER — PHENYLEPHRINE HCL 10 MG/ML IJ SOLN
0.0000 ug/min | INTRAMUSCULAR | Status: DC
  Administered 2016-11-18: 20 ug/min via INTRAVENOUS
  Administered 2016-11-18: 200 ug/min via INTRAVENOUS
  Filled 2016-11-18 (×2): qty 1

## 2016-11-18 MED ORDER — FENTANYL CITRATE (PF) 100 MCG/2ML IJ SOLN
50.0000 ug | INTRAMUSCULAR | Status: DC | PRN
  Filled 2016-11-18: qty 2

## 2016-11-18 MED ORDER — SODIUM CHLORIDE 0.9 % IV BOLUS (SEPSIS)
250.0000 mL | Freq: Once | INTRAVENOUS | Status: AC
  Administered 2016-11-18: 250 mL via INTRAVENOUS

## 2016-11-18 MED ORDER — SENNOSIDES 8.8 MG/5ML PO SYRP: 5.0000 mL | ORAL_SOLUTION | Freq: Two times a day (BID) | ORAL | Status: DC | PRN

## 2016-11-18 MED ORDER — METOPROLOL TARTRATE 5 MG/5ML IV SOLN
INTRAVENOUS | Status: AC
  Filled 2016-11-18: qty 5

## 2016-11-18 MED ORDER — FUROSEMIDE 10 MG/ML IJ SOLN
20.0000 mg | Freq: Two times a day (BID) | INTRAMUSCULAR | Status: DC
  Administered 2016-11-18 – 2016-11-19 (×2): 20 mg via INTRAVENOUS
  Filled 2016-11-18 (×2): qty 2

## 2016-11-18 MED ORDER — FLUTICASONE FUROATE-VILANTEROL 100-25 MCG/INH IN AEPB
1.0000 | INHALATION_SPRAY | Freq: Every day | RESPIRATORY_TRACT | Status: DC
  Filled 2016-11-18: qty 28

## 2016-11-18 MED ORDER — AMIODARONE IV BOLUS ONLY 150 MG/100ML: 150.0000 mg | Freq: Once | INTRAVENOUS | Status: DC

## 2016-11-18 MED ORDER — ONDANSETRON HCL 4 MG/2ML IJ SOLN
INTRAMUSCULAR | Status: AC
  Administered 2016-11-18: 4 mg
  Filled 2016-11-18: qty 2

## 2016-11-18 MED ORDER — ACETAMINOPHEN 325 MG PO TABS: 650.0000 mg | ORAL_TABLET | ORAL | Status: DC | PRN

## 2016-11-18 MED ORDER — LORAZEPAM 2 MG/ML IJ SOLN
INTRAMUSCULAR | Status: AC
  Filled 2016-11-18: qty 1

## 2016-11-18 NOTE — ED Notes (Signed)
ED Provider at bedside. 

## 2016-11-18 NOTE — ED Provider Notes (Signed)
CENTRAL LINE Performed by: Payton Emerald Consent: The procedure was performed in an emergent situation. Required items: required blood products, implants, devices, and special equipment available Patient identity confirmed: arm band and provided demographic data Time out: Immediately prior to procedure a "time out" was called to verify the correct patient, procedure, equipment, support staff and site/side marked as required. Indications: vascular access Patient sedated: no Preparation: skin prepped with 2% chlorhexidine Skin prep agent dried: skin prep agent completely dried prior to procedure Sterile barriers: all five maximum sterile barriers used - cap, mask, sterile gown, sterile gloves, and large sterile sheet Hand hygiene: hand hygiene performed prior to central venous catheter insertion  Location details: L IJ  Catheter type: triple lumen Catheter size: 8 Fr Pre-procedure: landmarks identified Ultrasound guidance: Yes Successful placement: yes Post-procedure: line sutured and dressing applied Assessment: blood return through all parts, free fluid flow, post-procedure XR pending Patient tolerance: Patient tolerated the procedure well with no immediate complications.    Payton Emerald, MD 11/20/2016 1627    Julianne Rice, MD 11/22/16 1455

## 2016-11-18 NOTE — ED Notes (Signed)
Pt has had a few non sustained runs of Vtach 4-5 beats, MD yelverton made aware, pt asymptotic during event. Zoll pads applied.

## 2016-11-18 NOTE — ED Notes (Signed)
Family at bedside. 

## 2016-11-18 NOTE — Progress Notes (Signed)
ANTICOAGULATION CONSULT NOTE - Initial Consult  Pharmacy Consult for Apixaban - Eliquis Indication: atrial fibrillation  No Known Allergies  Patient Measurements: Height - 4'11" Weight - 123 lb  Vital Signs: Temp: 98.1 F (36.7 C) (06/03 0752) Temp Source: Oral (06/03 0752) BP: 123/112 (06/03 1245) Pulse Rate: 75 (06/03 1245)  Labs:  Recent Labs  11/20/2016 0755  HGB 10.5*  HCT 36.1  PLT 177  LABPROT 22.3*  INR 1.93  CREATININE 1.07*   Medical History: Past Medical History:  Diagnosis Date  . Anemia   . Anxiety   . Arthritis   . Blood transfusion without reported diagnosis   . Cataract    BILATERAL  . Centrilobular emphysema ([place])   . Collagenous colitis   . Crohn's disease ([place])   . Depression   . Duodenal stricture   . Duodenal ulcer   . Esophageal reflux   . Heart disease   . Heart murmur   . Hiatal hernia   . Hyperlipemia   . Hypertension   . Osteoporosis   . Paroxysmal A-fib ([place]) 07/09/2011  . Peripheral vascular disease ([place])   . Pneumonia   . Pulmonary hypertension (HCC)    Assessment: 81yo female with multiple medical problems is seen today for Afib.  She has been on Aspirin and Clopidogrel and we have been asked to initiate apixaban for her.  She is borderline with dosing (Age > 80, weight < 60kg and creatinine of 1.0).  She currently has elevated LFT's and is being evaluated for this as well.  Her H/H are 10.5/36.1 and her platelets are 177K.  PT/INR are elevated as well at 22.3/1.60. Dr. Marlou Porch noted possible reduced dosage for her and I concur that this is reasonable.  Plan:  Begin apixaban 2.48m twice daily Monitor for bleeding complications  NRober Minion PharmD., MS Clinical Pharmacist Pager:  [phone] you for allowing pharmacy to be part of this patients care team. 12/04/2016,1:07 PM

## 2016-11-18 NOTE — ED Notes (Signed)
MD yelverton made aware pts HR is increasing back into 140's with frequent pvc's, verbal order for 2.5 metoprolol IV.

## 2016-11-18 NOTE — ED Notes (Signed)
Cardiology at bedside.

## 2016-11-18 NOTE — ED Notes (Signed)
MD Posey Pronto made aware of episode.

## 2016-11-18 NOTE — ED Notes (Signed)
ED resident at bedside to place central line. Family placed in consultation room.

## 2016-11-18 NOTE — Code Documentation (Signed)
Preparing to intubate.

## 2016-11-18 NOTE — ED Notes (Signed)
CCM at bedside along with [facility] and family.

## 2016-11-18 NOTE — H&P (Signed)
Cristina Parker is an 81 y.o. female.   Chief Complaint:   respirator ditress  HPI:    This Is a 72-yar-old female with h/o Afib, PAH on macitentan per Dr Precious Haws, HTN, CAD s/p cabg and PVD  Opresents with ongoing shortness of breath for past few days and lower exrmity edma and weight gain   Hstory is obtained from medical records and patient's son at bedside since patient is intubated.  Since 1 day she has been having worsening sob and weakness. No chest pain or fever orchills or cough or expectoration. She missed taking her lasix over the memorial day weekend. She tried to get to the bathroom today and had difficulty moving herself. So her son called EMS. No complaints with bowel or bladder.   She was initially planned to be admitted to the step down unit and was admitted by internal medicine team. Cardiology saw her earlier today. However later while in  ER, she had an episode which is suspected to be seizures. She was also found to be hypoglycemic with BS in40s, so she got intubated. Currently she is on vent and moves randomly but not arousable.   Her BP is not reading consistently well but seemed in the 57W systolic so ER phsyician started her on levophed and plan is to put central line by ER resident.   Last ECHO in 10/2015 with EF 50-55% and RHC in 11/2015 showing moderate PAH with mildly elevated RA pressure and PCWP.   She currently uses 5-6L supplemental O2 at all times. She states that her diet is fairly limited now and includes boost shakes and prepared breakfast meals.    She arrived in the ED in resp distress.  O2 sat 81% on 6L [place] with wheezes and in A-Fib with RVR with HR in 150s.  She was started on albuterol and cardizem for A-fib with RVR.  However, her sbp dropped to 70s and cardizem was stopped.  With concern that she was fluid up, she only received a 750 mL NS bolus.   CXR showed bilateral pleural effusions with R>L and cardiomegaly.     Cardiology was consulted and recommended  low-dose metoprolol tartrate for her Afib with RVR. She was given 5 mg in the ED.  She had a few non-sustained runs of 4-5 beats of Vtach, but patient was asymptomatic at the time.  Additionally, amiodarone was considered, but AST and ALT were elevated to 1,238 and 823 respectively.  Abd [place] was scheduled.    CBG was 42 and 1 amp of D50 was given and CBG was 201 on recheck. 1 mg epi was given with HR in 50s.  Her HR returned to 150s but she remained largely unresponsive.  She was given 1 mg ativan for possible seizure activity.  Patient was then intermittently able to answer questions and follow commands with her eyes closed.  Bag mask ventilation was started.   Family History:  Paternal history of heart disease in 43s and PVD. Maternal history of PVD and history of PVD with brother.   MGM and PGF with heart disease.  Social History:  Former Marine scientist at [employer].  Now lives with son, daughter-in-law and grandchild.  Unable to ambulate well on her own at baseline and uses 5-6L supplemental O2 at all times.   Past Medical History:  Diagnosis Date  . Anemia   . Anxiety   . Arthritis   . Blood transfusion without reported diagnosis   . Cataract  BILATERAL  . Centrilobular emphysema ([place])   . Collagenous colitis   . Crohn's disease ([place])   . Depression   . Duodenal stricture   . Duodenal ulcer   . Esophageal reflux   . Heart disease   . Heart murmur   . Hiatal hernia   . Hyperlipemia   . Hypertension   . Osteoporosis   . Paroxysmal A-fib ([place]) 07/09/2011  . Peripheral vascular disease ([place])   . Pneumonia   . Pulmonary hypertension ([place])     Past Surgical History:  Procedure Laterality Date  . ABDOMINAL HYSTERECTOMY    . 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 BUNIONECTOMY  1977   both feet   . CARDIAC CATHETERIZATION N/A 12/08/2015   Procedure: Right Heart Cath;  Surgeon: Larey Dresser, MD;  Location: [facility];  Service:  Cardiovascular;  Laterality: N/A;  . CAROTID ENDARTERECTOMY  05-1998   Left CEA  . CAROTID ENDARTERECTOMY  04/11/11   Right CEA  . CERVICAL DISC SURGERY    . CESAREAN SECTION    . COLONOSCOPY    . CORONARY ARTERY BYPASS GRAFT    . EYE SURGERY  10/2010   left eye cataract  . FRACTURE SURGERY  1998   ORIF  . HIP SURGERY     right  . TUBAL LIGATION      Family History  Problem Relation Age of Onset  . Colon polyps Father   . Heart disease Father        Before age 39  . Stroke Father   . Deep vein thrombosis Father   . Vascular Disease Brother   . Alcoholism Paternal Grandfather   . Heart disease Paternal Grandmother   . Hypertension Mother   . Varicose Veins Mother   . Peripheral vascular disease Mother   . Osteoporosis Mother   . Heart disease Maternal Grandmother   . Colon cancer Neg Hx   . Esophageal cancer Neg Hx   . Rectal cancer Neg Hx   . Stomach cancer Neg Hx    Social History:  reports that she quit smoking about 20 years ago. Her smoking use included Cigarettes. She has a 35.00 pack-year smoking history. She has never used smokeless tobacco. She reports that she drinks about 4.2 - 8.4 oz of alcohol per week . She reports that she does not use drugs.  Allergies: No Known Allergies   (Not in a hospital admission)  Results for orders placed or performed during the hospital encounter of 11/19/2016 (from the past 48 hour(s))  Basic metabolic panel     Status: Abnormal   Collection Time: 12/11/2016  7:55 AM  Result Value Ref Range   Sodium 141 135 - 145 mmol/L   Potassium 5.3 (H) 3.5 - 5.1 mmol/L    Comment: HEMOLYSIS AT THIS LEVEL MAY AFFECT RESULT   [place] 98 (L) 101 - 111 mmol/L   CO2 23 22 - 32 mmol/L   Glucose, Bld 87 65 - 99 mg/dL   BUN 32 (H) 6 - 20 mg/dL   Creatinine, Ser 1.07 (H) 0.44 - 1.00 mg/dL   Calcium 9.2 8.9 - 10.3 mg/dL   GFR calc non Af Amer 48 (L) >60 mL/min   GFR calc Af Amer 56 (L) >60 mL/min    Comment: (NOTE) The eGFR has been  calculated using the CKD EPI equation. This calculation has not been validated in all clinical situations. eGFR's persistently <60 mL/min  signify possible Chronic Kidney Disease.    Anion gap 20 (H) 5 - 15  CBC     Status: Abnormal   Collection Time: 11/22/2016  7:55 AM  Result Value Ref Range   WBC 11.3 (H) 4.0 - 10.5 K/uL   RBC 3.62 (L) 3.87 - 5.11 MIL/uL   Hemoglobin 10.5 (L) 12.0 - 15.0 g/dL   HCT 36.1 36.0 - 46.0 %   MCV 99.7 78.0 - 100.0 fL   MCH 29.0 26.0 - 34.0 pg   MCHC 29.1 (L) 30.0 - 36.0 g/dL   RDW 16.1 (H) 11.5 - 15.5 %   Platelets 177 150 - 400 K/uL  Protime-INR (order if Patient is taking Coumadin / Warfarin)     Status: Abnormal   Collection Time: 12/15/2016  7:55 AM  Result Value Ref Range   Prothrombin Time 22.3 (H) 11.4 - 15.2 seconds   INR 1.93   Hepatic function panel     Status: Abnormal   Collection Time: 11/17/2016  7:55 AM  Result Value Ref Range   Total Protein 6.2 (L) 6.5 - 8.1 g/dL   Albumin 3.4 (L) 3.5 - 5.0 g/dL   AST 1,238 (H) 15 - 41 U/L   ALT 823 (H) 14 - 54 U/L   Alkaline Phosphatase 150 (H) 38 - 126 U/L   Total Bilirubin 2.2 (H) 0.3 - 1.2 mg/dL   Bilirubin, Direct 0.8 (H) 0.1 - 0.5 mg/dL   Indirect Bilirubin 1.4 (H) 0.3 - 0.9 mg/dL  Lipase, blood     Status: None   Collection Time: 12/13/2016  7:55 AM  Result Value Ref Range   Lipase 31 11 - 51 U/L  Brain natriuretic peptide     Status: Abnormal   Collection Time: 11/17/2016  7:58 AM  Result Value Ref Range   B Natriuretic Peptide 1,106.7 (H) 0.0 - 100.0 pg/mL  I-stat troponin, ED     Status: Abnormal   Collection Time: 12/12/2016  8:15 AM  Result Value Ref Range   Troponin i, poc 0.46 (HH) 0.00 - 0.08 ng/mL   Comment NOTIFIED PHYSICIAN    Comment 3            Comment: Due to the release kinetics of cTnI, a negative result within the first hours of the onset of symptoms does not rule out myocardial infarction with certainty. If myocardial infarction is still suspected, repeat the test at  appropriate intervals.   CBG monitoring, ED     Status: Abnormal   Collection Time: 12/01/2016  1:12 PM  Result Value Ref Range   Glucose-Capillary 42 (LL) 65 - 99 mg/dL  CBG monitoring, ED     Status: Abnormal   Collection Time: 11/27/2016  1:22 PM  Result Value Ref Range   Glucose-Capillary 201 (H) 65 - 99 mg/dL   Comment 1 Notify RN    Comment 2 Document in Chart   I-Stat arterial blood gas, ED     Status: Abnormal   Collection Time: 11/30/2016  2:08 PM  Result Value Ref Range   pH, Arterial 7.266 (L) 7.350 - 7.450   pCO2 arterial 51.9 (H) 32.0 - 48.0 mmHg   pO2, Arterial 33.0 (LL) 83.0 - 108.0 mmHg   Bicarbonate 23.6 20.0 - 28.0 mmol/L   TCO2 25 0 - 100 mmol/L   O2 Saturation 55.0 %   Acid-base deficit 4.0 (H) 0.0 - 2.0 mmol/L   Patient temperature HIDE    Sample type ARTERIAL    Comment NOTIFIED PHYSICIAN  I-Stat arterial blood gas, ED     Status: Abnormal   Collection Time: 12/15/2016  2:35 PM  Result Value Ref Range   pH, Arterial 7.225 (L) 7.350 - 7.450   pCO2 arterial 61.8 (H) 32.0 - 48.0 mmHg   pO2, Arterial 22.0 (LL) 83.0 - 108.0 mmHg   Bicarbonate 25.6 20.0 - 28.0 mmol/L   TCO2 27 0 - 100 mmol/L   O2 Saturation 26.0 %   Acid-base deficit 3.0 (H) 0.0 - 2.0 mmol/L   Patient temperature HIDE    Sample type ARTERIAL    Comment NOTIFIED PHYSICIAN   CBG monitoring, ED     Status: Abnormal   Collection Time: 12/12/2016  3:10 PM  Result Value Ref Range   Glucose-Capillary 112 (H) 65 - 99 mg/dL   Dg Chest Port 1 View  Result Date: 11/22/2016 CLINICAL DATA:  Patient status post intubation. EXAM: PORTABLE CHEST 1 VIEW COMPARISON:  Chest radiograph 11/23/2016 FINDINGS: ET tube terminates in the mid trachea. Enteric tube tip and side-port project over the stomach. Pacer apparatus overlies the left hemithorax. Patient status post median sternotomy. Low lung volumes. Persistent moderate right and small left pleural effusions with underlying pulmonary consolidation which appears  worsened within the right greater than left mid and lower lungs. No pneumothorax. Osseous destruction right humeral head. IMPRESSION: ET tube terminates in the mid trachea. Worsening moderate right and small left pleural effusions with increasing bilateral underlying heterogeneous pulmonary opacities which may represent atelectasis or infection. Electronically Signed   By: Lovey Newcomer M.D.   On: 12/13/2016 14:17   Dg Chest Port 1 View  Result Date: 11/20/2016 CLINICAL DATA:  Shortness of breath for 1 day. EXAM: PORTABLE CHEST 1 VIEW COMPARISON:  06/20/2016 and prior exams FINDINGS: Cardiomegaly and CABG changes noted. A new moderate right pleural effusion noted with right lower lung atelectasis. A small left pleural effusion has enlarged with associated left basilar atelectasis. There is no evidence of pneumothorax. No acute bony abnormality noted. Severe right shoulder degenerative changes and cervical spine surgical changes noted. IMPRESSION: New moderate right pleural effusion and right lower lung atelectasis. Slightly increased small left pleural effusion with left basilar atelectasis. Cardiomegaly and CABG changes. Electronically Signed   By: Margarette Canada M.D.   On: 12/08/2016 08:27    Review of Systems  Unable to perform ROS: Intubated    Blood pressure (!) 105/49, pulse (!) 104, temperature 98.1 F (36.7 C), temperature source Oral, resp. rate 15, height _0  (1.499 m), weight 123 lb (55.8 kg), SpO2 100 %. Physical Exam  Nursing note and vitals reviewed. Constitutional: She appears distressed.  HENT:  Head: Normocephalic and atraumatic.  Left Ear: External ear normal.  Mouth/Throat: No oropharyngeal exudate.  Intubated.   Eyes: EOM are normal. Pupils are equal, round, and reactive to light. Right eye exhibits no discharge. Left eye exhibits no discharge.  Neck: Neck supple. No JVD present. No tracheal deviation present. Thyromegaly present.  Cardiovascular: Regular rhythm and intact  distal pulses.  Exam reveals no gallop and no friction rub.   No murmur heard. Respiratory: No respiratory distress. She has no wheezes. She has no rales.  Intubated. Decrease breath sounds at the bases.  GI: Soft. Bowel sounds are normal. She exhibits no distension. There is no tenderness. There is no rebound and no guarding.  Musculoskeletal: She exhibits edema. She exhibits no tenderness or deformity.  Neurological: She displays normal reflexes. No cranial nerve deficit. Coordination normal.  Moves all 4 ext  spont however non arousable and non responsive.  Skin: Skin is warm and dry. She is not diaphoretic. No erythema.  Psychiatric: Her behavior is normal.     Assessment/Plan Acute on chronic hypoxic respiratory failure Cardiogenic shock Afib with RVR Transaminitis   Plan: Admit to icu Initiate critical care protocols including ventilator bundle Wean peep and fio2 as tolerated Repeat abg in 4 hours Stat echo, if RV down, consider flolan. Holding macitentan due to hepatotoxicity concerns.  Check CTA chest to rule out PE (she was not anticoagulated as outpatient) Cardiology is already consulted, cardizem stopped due to low BP, cannot give amio due to hepatotoxicity concerns in setting of elevated liver enzymes  May need to stop eliquis and switch to heparin gtt.  Check [place] RUQ, prior CT mentioned possibility of cirrhosis. Place foley and NG tube Check CT head and EEG since concern for seizures in ER. Check Ammonia. GI Px  Although some notes mention ILD, the recent most CT chest did not show any evidence of ILD. Also her PFTs did not show restriction which goes against ILD. All she had was diffusion limitation which can be seen with pulmonary vascular disease.  She is DNR. Discussed with patient's son at bedside. Patient's sister is the POA who I attempted to call but was not reachable. Son was advised to bring papers of her living will.   As per note from Dr Halford Chessman from last  month, she was DNR and DNI but the son said it was decided to let her be intubated today morning because the patient told him that she is not yet ready to die. However he clarified that she remains DNR .  STAFF NOTE: I, Sharia Reeve, MD have personally reviewed patient's available data, including medical history, events of note, physical examination and test results as part of my evaluation.   The patient is critically ill with multiple organ systems failure and requires high complexity decision making for assessment and support, frequent evaluation and titration of therapies, application of advanced monitoring technologies and extensive interpretation of multiple databases.   Critical Care Time devoted to patient care services described in this note is 45  Minutes. This time reflects time of care of this signee: Sharia Reeve, MD.  Sharia Reeve, MD 11/17/2016, 3:50 PM

## 2016-11-18 NOTE — H&P (Signed)
Date: 11/23/2016               Patient Name:  Cristina Parker MRN: [id number]  DOB: 11-27-1935 Age / Sex: 81 y.o., female   PCP: Shawnee Knapp, MD              Medical Service: Internal Medicine Teaching Service              Attending Physician: Dr. Evette Doffing, Mallie Mussel, *    First Contact: Marylen Ponto, MS4 Pager: [phone]  Second Contact: Dr. Zada Finders Pager: [phone]            After Hours (After 5p/  First Contact Pager: [phone]  weekends / holidays): Second Contact Pager: [phone]   Chief Complaint:   History of Present Illness:  [name] is a 81 yo F with history of paroxysmal Afib, HFpEF, PAH 2/2 ILD, HTN, CAD s/p CABG and imoninate bypass for severe arch disease with right carotid endartectomy, and PVD who presents with a ~ 1 month history of progressive SOB, LE edema, and weight gain and a 1 day history of worsening SOB and nausea.  Yesterday night when she noticed she was more SOB than usual when sitting in bed.  She then tried to get up and go to the bathroom and had extreme difficulty breathing.  She states a long history of SOB with exertion and orthopnea that seem to be getting progressively worse.  Additionally she notes LE edema and weight that has been getting worse over the last month.  Additionally she feels nauseous but has not had any emesis, abd pain, or diarrhea. She denies any fever, cough, CP, palpitations, diaphoresis, lightheadedness, urinary symptoms, HA, abd pain, vomiting, or diarrhea.   She has a history of paroxysmal A-fib but is not on any anticoagulation or rate control medication currently. She was to discuss this at her next visit to cardiology. Last ECHO in 10/2015 with EF 50-55% and RHC in 11/2015 showing moderate PAH with mildly elevated RA pressure and PCWP.  Additionally, she has a history of PAH 2/2 ILD managed on lasix 20 mg MWF.  She currently uses 5-6L supplemental O2 at all times. She states that her diet is fairly limited now and includes  boost shakes and prepared breakfast meals.    She arrived in the ED in resp distress.  O2 sat 81% on 6L  with wheezes and in A-Fib with RVR with HR in 150s.  She was started on albuterol and cardizem for A-fib with RVR.  However, her sbp dropped to 70s and cardizem was stopped.  With concern that she was fluid up, she only received a 750 mL NS bolus.  BNP of 1,100 with last check during an episode of 742. CXR showed bilateral pleural effusions with R>L and cardiomegaly.   Cardiology was consulted and recommended low-dose metoprolol tartrate for her Afib with RVR. She was given 5 mg in the ED.  She had a few non-sustained runs of 4-5 beats of Vtach, but patient was asymptomatic at the time.  Additionally, amiodarone was considered, but AST and ALT were elevated to 1,238 and 823 respectively.  Abd [place] was scheduled.     ADDENDUM See other documentation for additional details.    Patient started groaning, shaking in all extremities, and was incontinent. CBG was 42 and 1 amp of D50 was given and CBG was 201 on recheck. 1 mg epi was given with HR in 50s.  Her HR returned to 150s but  she remained largely unresponsive.  She was given 1 mg ativan for possible seizure activity.  Patient was then intermittently able to answer questions and follow commands with her eyes closed.  Bag mask ventilation was started.  Pulse oximetry readings were unable to be obtained despite repeated attempts to replace oximeter.  She remained unresponsive and was successfully intubated.     -- Will consult PCCM for further managment  Meds: No outpatient prescriptions have been marked as taking for the 11/30/2016 encounter [facility] Encounter).     Allergies: Allergies as of 12/07/2016  . (No Known Allergies)   Past Medical History:  Diagnosis Date  . Anemia   . Anxiety   . Arthritis   . Blood transfusion without reported diagnosis   . Cataract    BILATERAL  . Centrilobular emphysema ([place])   . Collagenous colitis   .  Crohn's disease ([place])   . Depression   . Duodenal stricture   . Duodenal ulcer   . Esophageal reflux   . Heart disease   . Heart murmur   . Hiatal hernia   . Hyperlipemia   . Hypertension   . Osteoporosis   . Paroxysmal A-fib ([place]) 07/09/2011  . Peripheral vascular disease ([place])   . Pneumonia   . Pulmonary hypertension (HCC)     Family History:  Paternal history of heart disease in 49s and PVD. Maternal history of PVD and history of PVD with brother.   MGM and PGF with heart disease.  Social History:  Former Marine scientist at [employer].  Now lives with son, daughter-in-law and grandchild.  Unable to ambulate well on her own at baseline and uses 5-6L supplemental O2 at all times.  Review of Systems: Constitutional: Wt gain. Denies fever, fatigue, chills, night sweats. HEENT: No sinus pain. No change in vision or hearing.  CV:  Orthopnea and dyspnea at rest. Denies chest pain, palpitations. Resp: SOB. No cough. GI: Nausea. Denies abdominal pain, vomiting, diarrhea.  No constipation, blood in stool, dysphagia MSK: Denies arthralgias, myalgias, joint pain Urology: Denies dysuria, hematuria, change in urinary frequency, change in urinary urgency Skin: Denies new rash, new worrisome lesions Neurology: Denies headache, numbness Psychology:  Denies depressed mood, agitation, sleep problems   Physical Exam: Blood pressure 113/84, pulse 75, temperature 98.1 F (36.7 C), temperature source Oral, resp. rate 14, height _0  (1.499 m), weight 123 lb (55.8 kg), SpO2 100 %. Gen:  Patient lying in bed with [place].   Neuro:  Strength 5/5 bilaterally in upper and lower extremities. HEENT:  Head: NCAT.  Eyes: EOMI. PERRLA.  Mouth: Moist mucous membranes. Neck: No thyromegaly.   CV: Distant heart sounds. RRR with no murmurs, rubs, or gallops. Pulm:  CTAB, no wheezes or rhonchi. Abdomen: Soft, non-tender to palpation in four quadrants.  Normal BS appreciated.  Extremities: 1+ pitting bilateral LE  edema.  L>R.  No skin lesions appreciated.  Pulses in all extremities intact. Skin: Warm and dry.  Atrophic with peeling especially over lower extremities.    Assessment & Plan by Problem: Active Problems:   Atrial fibrillation with RVR ([place])  Ms. Hachey is a 81 yo F with history of paroxysmal Afib, PAH 2/2 ILD, CAD s/p CABG and imoninate bypass for severe arch disease with right carotid endartectomy, PVD, and HTN who presents with a ~ 1 month history of progressive SOB, LE edema, and weight gain and a 1 day history of worsening SOB and nausea in the context of A-fib.   Afib with  RVR - Presented in Afib with RVR to 150s. It is unclear at this time what precipitated this event given afebrile and no infx symptoms with mildly elevated WBC of 11.3 and no other clear inciting event. Given her elevated LFTs could investigate possible acute hepatitis as an etiology.  This could also be consistent with hypoperfusion, but was normotensive other than short period when started on diltiazem.  Management in the ED with IV diltiazem resulted in hypotension to 70s. Started metop-tartrate  In ED.  BNP of 1,100 with last check during an episode in January of 742.  CHADS2VASC of 5.  - cardiology following.  Appreciate their recommendations. - metop-tartrate 25 mg BID -Eliquis 2.5 BID - Stop ASA, plavix - BC  - zofran 61m q6 PRN form nausea  Acute diastolic heart failure - CXR shows bilateral pulm edema R>L and cardiomegaly. ECHO is 2017 showing grade 2 diastolic dysfunction.  BNP of 1100 with prior of 742 in setting or similar presentation.  Would consider low dose lasix later given LE edema and CXR, but pressures currently ~95/60.   - ECHO - repeat CXR tomorrow - possible low dose lasix later if Bp improves  COPD with chronic hypoxia -  Current regimen includes breo ellipta and lasix 20 mg MWF. On 5-6L supplemental O2 at baseline. Failed Macintan therapy and was taken off within the last month.  ECHO and RHC  last [facility] over the past month of LE edema likely representing worsening of R-sided heart function.    - continue breo ellipta 1 puff daily - will consider lasix therapy when bp becomes more stable  Elevated troponin- Troponin of 0.46 is believed at this time to represent demand ischemia given EKG showing only A-fib and PVCs.   - Will continue to trend troponin-I - repeat EKG in AM  Elevated LFTs - AST and ALT were elevated to 1,238 and 823 respectively.  Non-specific history of hepatitis in 7th grade.  Indeterminate Hep A IgM in 06/2016.  Less likely to be due to hypoperfusion given was normotensive before diltiazem was given.   - recheck LFTs tomorrow - Consider rechecking Hep A IgM given indeterminate results 5 months ago  Hyperkalemia-  K of 5.3, but hemolyzed sample - recheck BMP  VTE proph: Starting eliquis Code status: Full, but does not want to "live on life support"  Signed: PZada Finders MD 12/07/2016, 3:06 PM   Attestation for Student Documentation:  I personally was present and performed or re-performed the history, physical exam and medical decision-making activities of this service and have verified that the service and findings are accurately documented in the student's note.  Initially, patient fully oriented alert and conversing appropriately when seen in the ED. Her condition deteriorated afterwards as documented above and in ED notes, requiring intubation. On initial conversation prior to her deconditioning she did confirm with uKoreathat she would want initial resuscitation, however would not want to remain on life support for a prolonged period of time/ if the underlying conditions were not reversible. In addition to the above, we had planned to trend troponins and obtain UA, blood cultures to evaluate for underlying infectious process.  ED, Respiratory, and PCCM assistance is greatly appreciated.  PZada Finders MD 12/15/2016,  3:06 PM

## 2016-11-18 NOTE — ED Notes (Signed)
MD yelverton at bedside attempting femoral stick for ABG.

## 2016-11-18 NOTE — Consult Note (Signed)
Cardiology Consultation:   Patient ID: Cristina Parker; [id number]; 31-Mar-1936   Admit date: 11/22/2016 Date of Consult: 12/13/2016  Primary Care Provider: Shawnee Knapp, MD Primary Cardiologist: Dr. Johnsie Cancel   Patient Profile:   Cristina Parker is a 81 y.o. female  who is being seen today for the evaluation of Atrial fibrillation, mildly elevated troponin at the request of [place].  History of Present Illness:   Cristina Parker 81 year old female with coronary artery disease status post CABG with LIMA to LAD in 2005 with combined peripheral vascular disease innominate bypass for severe arch disease with prior right carotid endarterectomy in 1999, extensive left subclavian disease with COPD and history of paroxysmal atrial fibrillation seen during last admission, here with shortness of breath, tachycardia consistent with atrial fibrillation and rapid ventricular response with markedly elevated liver enzymes, AST 1200, ALT 800 currently not on anticoagulation.  I examined her in the emergency room, she is currently fairly comfortable in bed with heart rates in the 110s to 120s. She states that yesterday she began to feel short of breath but no chest pain, no diaphoresis. Trouble walking short distances because of her dyspnea on exertion. She has had similar type of symptoms, that is during previous hospitalization when she was known to be in atrial fibrillation and spontaneously converted and her symptoms improved. She also has compounding COPD underlying as well. No significant reports of fevers, syncope, bleeding, orthopnea. She has chronic lower extremity edema she states.  She also says that her primary physician has recently mentioned her that she should probably see cardiology once again to discuss anticoagulation. She has not seen Dr. Johnsie Cancel since 2016.  Past Medical History:  Diagnosis Date  . Anemia   . Anxiety   . Arthritis   . Blood transfusion without reported diagnosis   .  Cataract    BILATERAL  . Centrilobular emphysema ([place])   . Collagenous colitis   . Crohn's disease ([place])   . Depression   . Duodenal stricture   . Duodenal ulcer   . Esophageal reflux   . Heart disease   . Heart murmur   . Hiatal hernia   . Hyperlipemia   . Hypertension   . Osteoporosis   . Paroxysmal A-fib ([place]) 07/09/2011  . Peripheral vascular disease ([place])   . Pneumonia   . Pulmonary hypertension ([place])     Past Surgical History:  Procedure Laterality Date  . ABDOMINAL HYSTERECTOMY    . 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 BUNIONECTOMY  1977   both feet   . CARDIAC CATHETERIZATION N/A 12/08/2015   Procedure: Right Heart Cath;  Surgeon: Larey Dresser, MD;  Location: [facility];  Service: Cardiovascular;  Laterality: N/A;  . CAROTID ENDARTERECTOMY  05-1998   Left CEA  . CAROTID ENDARTERECTOMY  04/11/11   Right CEA  . CERVICAL DISC SURGERY    . CESAREAN SECTION    . COLONOSCOPY    . CORONARY ARTERY BYPASS GRAFT    . EYE SURGERY  10/2010   left eye cataract  . FRACTURE SURGERY  1998   ORIF  . HIP SURGERY     right  . TUBAL LIGATION       Inpatient Medications: Scheduled Meds: . metoprolol tartrate       No current facility-administered medications on file prior to encounter.    Current Outpatient Prescriptions on File Prior to Encounter  Medication Sig Dispense Refill  .  Ascorbic Acid (VITAMIN C) 1000 MG tablet Take 1,000 mg by mouth daily at 12 noon.     Marland Kitchen aspirin 81 MG tablet Take 81 mg by mouth every morning.     Marland Kitchen aspirin-acetaminophen-caffeine (EXCEDRIN MIGRAINE) 250-250-65 MG tablet Take 1 tablet by mouth 2 (two) times daily.    Marland Kitchen atorvastatin (LIPITOR) 40 MG tablet Take 1 tablet (40 mg total) by mouth daily. (Patient taking differently: Take 40 mg by mouth every morning. ) 90 tablet 3  . BREO ELLIPTA 100-25 MCG/INH AEPB INHALE 1 PUFF INTO THE LUNGS DAILY 30 each 2  . calcium carbonate (OS-CAL - DOSED IN MG OF  ELEMENTAL CALCIUM) 1250 (500 Ca) MG tablet Take 1 tablet by mouth daily with breakfast.    . clopidogrel (PLAVIX) 75 MG tablet TAKE 1 TABLET BY MOUTH EVERY NIGHT AT BEDTIME 90 tablet 1  . escitalopram (LEXAPRO) 20 MG tablet TAKE 1 TABLET(20 MG) BY MOUTH DAILY 90 tablet 1  . feeding supplement, ENSURE ENLIVE, (ENSURE ENLIVE) LIQD Take 237 mLs by mouth 2 (two) times daily between meals. 237 mL 12  . furosemide (LASIX) 20 MG tablet Take 1 tablet daily on Monday, Wednesday, and Friday. 20 tablet 2  . HYDROcodone-acetaminophen (NORCO) 10-325 MG tablet Take 1-2 tablets by mouth every 6 (six) hours as needed. 240 tablet 0  . LORazepam (ATIVAN) 0.5 MG tablet Take 0.5 tablets (0.25 mg total) by mouth 2 (two) times daily. 30 tablet 3  . LUMIGAN 0.01 % SOLN Apply 1 drop in both eyes every night    . Macitentan (OPSUMIT) 10 MG TABS Take 10 mg by mouth daily.    . metoprolol succinate (TOPROL-XL) 50 MG 24 hr tablet Take 1 tablet (50 mg total) by mouth daily. with or immediately following a meal. 90 tablet 3  . Multiple Vitamin (MULTIVITAMIN WITH MINERALS) TABS tablet Take 1 tablet by mouth daily.    Marland Kitchen nystatin (MYCOSTATIN) 100000 UNIT/ML suspension Take 5 mLs (500,000 Units total) by mouth 4 (four) times daily. 200 mL 0  . OXYGEN Inhale 2-3 L into the lungs daily. Oxygen 2lpm with rest and 3 lpm with exertion    . pantoprazole (PROTONIX) 40 MG tablet Take 40 mg by mouth every morning.     . potassium chloride (K-DUR) 10 MEQ tablet 1 tab daily on Monday, Wednesday, and Friday. 20 tablet 2     Allergies:   No Known Allergies  Social History:   Social History   Social History  . Marital status: Widowed    Spouse name: N/A  . Number of children: 2  . Years of education: N/A   Occupational History  . retired Therapist, sports    Social History Main Topics  . Smoking status: Former Smoker    Packs/day: 1.00    Years: 35.00    Types: Cigarettes    Quit date: 06/18/1996  . Smokeless tobacco: Never Used  . Alcohol  use 4.2 - 8.4 oz/week    7 - 14 Standard drinks or equivalent per week     Comment: WINE  . Drug use: No  . Sexual activity: No   Other Topics Concern  . Not on file   Social History Narrative   Retired Marine scientist;   Lives with son and grandson    Family History:   The patient's family history includes Alcoholism in her paternal grandfather; Colon polyps in her father; Deep vein thrombosis in her father; Heart disease in her father, maternal grandmother, and paternal grandmother; Hypertension in  her mother; Osteoporosis in her mother; Peripheral vascular disease in her mother; Stroke in her father; Varicose Veins in her mother; Vascular Disease in her brother. There is no history of Colon cancer, Esophageal cancer, Rectal cancer, or Stomach cancer.  ROS:  Please see the history of present illness.  ROS  All other ROS reviewed and negative.     Physical Exam/Data:   Vitals:   11/27/2016 1015 11/22/2016 1033 12/06/2016 1050 11/28/2016 1100  BP: (!) 83/68 (!) 125/99 (!) 81/71 103/85  Pulse: (!) 118 (!) 111 (!) 123 92  Resp: _0 Temp:      TempSrc:      SpO2: (!) 83% (!) 87% (!) 82% (!) 84%    Intake/Output Summary (Last 24 hours) at 12/03/2016 1131 Last data filed at 11/24/2016 1121  Gross per 24 hour  Intake           751.83 ml  Output                0 ml  Net           751.83 ml   There were no vitals filed for this visit. There is no height or weight on file to calculate BMI.  General:  Well nourished, well developed, in no acute distress, Elderly, somewhat frail-appearing HEENT: normal Lymph: no adenopathy Neck: no JVD Endocrine:  No thryomegaly Vascular: + carotid bruits  Cardiac:  normal S1, S2; irreg irreg; 1/6 systolic murmur heard over left precordial region Lungs:  clear to auscultation bilaterally, no active wheezing, rhonchi or rales  Abd: soft, nontender, no hepatomegaly  Ext: 1+ bilateral lower extremity edema Musculoskeletal:  No deformities, BUE and BLE  strength normal and equal Skin: warm and dry  Neuro:  CNs 2-12 intact, no focal abnormalities noted Psych:  Normal affect    EKG:  The EKG was personally reviewed and demonstrates atrial fibrillation with rapid ventricular response with fairly diffuse and mild T-wave inversion noted. Prior EKG from 06/19/16 shows atrial fibrillation with rapid ventricular response with more pronounced nonspecific ST-T wave changes.  Relevant CV Studies:  Right heart catheterization 12/08/15 1. Borderline elevated PCWP and mildly elevated RA pressure.  2. Preserved cardiac output.  3. Moderate pulmonary arterial hypertension.   RHC Procedural Findings: Hemodynamics (mmHg) RA mean 10 RV 70/14 PA 66/19, mean 41 PCWP mean 16 Oxygen saturations:  PA 57% AO 95%  Cardiac Output (Fick) 3.3  Cardiac Index (Fick) 2.2 PVR 7.6 WU  Cardiac Output (thermo) 4.16 Cardiac Index (thermo) 2.77  PVR 6 WU  Patient has PAH likely related to her interstitial lung disease. No definite rheumatological disease has been identified.  Would plan treatment with selective pulmonary vasodilators, start with macitentan (will go ahead and start the paperwork as we complete her PAH workup).   ECHO: 11/03/15 - Left ventricle: The cavity size was normal. Systolic function was   normal. The estimated ejection fraction was in the range of 50%   to 55%. There is akinesis of the basalinferoseptal myocardium.   Features are consistent with a pseudonormal left ventricular   filling pattern, with concomitant abnormal relaxation and   increased filling pressure (grade 2 diastolic dysfunction). - Ventricular septum: The contour showed diastolic flattening. - Aortic valve: Trileaflet; mildly thickened, mildly calcified   leaflets. - Mitral valve: There was mild regurgitation. - Left atrium: The atrium was mildly dilated. - Pulmonary arteries: Systolic pressure was severely increased. PA   peak pressure: 69 mm Hg  (  S).  Impressions:  - When compared to prior, estimated PA has incresaed.  Laboratory Data:  Chemistry Recent Labs Lab 11/29/2016 0755  NA 141  K 5.3*  CL 98*  CO2 23  GLUCOSE 87  BUN 32*  CREATININE 1.07*  CALCIUM 9.2  GFRNONAA 48*  GFRAA 56*  ANIONGAP 20*     Recent Labs Lab 11/24/2016 0755  PROT 6.2*  ALBUMIN 3.4*  AST 1,238*  ALT 823*  ALKPHOS 150*  BILITOT 2.2*   Hematology Recent Labs Lab 11/22/2016 0755  WBC 11.3*  RBC 3.62*  HGB 10.5*  HCT 36.1  MCV 99.7  MCH 29.0  MCHC 29.1*  RDW 16.1*  PLT 177   Cardiac EnzymesNo results for input(s): TROPONINI in the last 168 hours.  Recent Labs Lab 12/01/2016 0815  TROPIPOC 0.46*    BNP Recent Labs Lab 12/05/2016 0758  BNP 1,106.7*    DDimer No results for input(s): DDIMER in the last 168 hours.  Radiology/Studies:  Dg Chest Port 1 View  Result Date: 12/15/2016 CLINICAL DATA:  Shortness of breath for 1 day. EXAM: PORTABLE CHEST 1 VIEW COMPARISON:  06/20/2016 and prior exams FINDINGS: Cardiomegaly and CABG changes noted. A new moderate right pleural effusion noted with right lower lung atelectasis. A small left pleural effusion has enlarged with associated left basilar atelectasis. There is no evidence of pneumothorax. No acute bony abnormality noted. Severe right shoulder degenerative changes and cervical spine surgical changes noted. IMPRESSION: New moderate right pleural effusion and right lower lung atelectasis. Slightly increased small left pleural effusion with left basilar atelectasis. Cardiomegaly and CABG changes. Electronically Signed   By: Margarette Canada M.D.   On: 12/04/2016 08:27    Assessment and Plan:   81 year old female with paroxysmal atrial fibrillation, peripheral vascular disease as well as carotid artery disease, coronary artery disease status post CABG with LIMA to LAD in 2005 with iliofemoral bypass as well as, here with acute diastolic heart failure, COPD, atrial fibrillation with rapid  ventricular response, marked LFT abnormality questionable shock liver versus form of hepatitis. She also has mildly elevated troponin.  Paroxysmal atrial fibrillation  - Let's utilize low-dose metoprolol by mouth 25 mg twice a day  . IV diltiazem resulted in original hypotension.  - At first I was considering amiodarone however her LFTs are markedly elevated currently and I do not want her to be subjected to any further potentially hepatotoxic agents.  - There is no urgent need for cardioversion.  - I will also initiate Eliquis 2.5 mg twice a day monotherapy and discontinue both her aspirin and Plavix given her advanced age and increased risks of bleeding. She is close to 81 years old and is less than 60 kg. I think that the Eliquis 2.5 mg twice a day dose make sense for her. I will have pharmacy review.  Acute diastolic heart failure likely a result of lack of atrial kick.  - Consider low-dose Lasix. Recently however had hypotension after diminished aeration of diltiazem IV. Her lower extremity edema is fairly chronic. BNP has been elevated in the past, 800, currently 1000.   Elevated troponin  - She has not had any chest discomfort, no anginal symptoms other than shortness of breath which could be attributed to both her COPD as well as her A. fib with RVR.  - This likely represents demand ischemia and I would recommend continuing to track the troponin for 2 more values. Of course, if troponin markedly increases, we may need to consider further  evaluation.  Elevated LFTs  - Per primary team. could this be an acute hepatitis, she did have transient hypotension here in the emergency department after diltiazem so shock liver seems less likely, ER is getting an ultrasound of the abdomen to make sure that she does not have any obstructive stone.   Coronary artery disease  - Post bypass, no active anginal symptoms.  Peripheral vascular disease  - Status post iliofemoral bypass. Post carotid  endarterectomy  - Has been maintained previously on both aspirin and Plavix however now with the atrial fibrillation, I would advocate utilization of Eliquis.  - Stable.  We will follow along.   Signed, Candee Furbish, MD  12/10/2016 11:31 AM

## 2016-11-18 NOTE — ED Notes (Signed)
Dr.Yelverton Shown results of Istat Troponin Ed-Lab.

## 2016-11-18 NOTE — ED Notes (Signed)
pts bp dropped to 70's md Yelverton made aware, states to pause Cardizem and started 500cc bolus.

## 2016-11-18 NOTE — ED Notes (Signed)
Pt had episode of becoming diaphoretic, clammy and difficultly speaking that last approximately 2 minutes. MD yelverton made aware and internal medicine paged. Pt denies any pain or sob. Pt is alert and follows commands, pts speech does seem to be mildly slurred. No other neuro deficit noted.

## 2016-11-18 NOTE — Code Documentation (Signed)
Pt had seizure like activity with shaking in upper and lower extremities. 78m ativan given IV

## 2016-11-18 NOTE — ED Triage Notes (Signed)
Pt arrives by [facility] from home with worsening on exertion shortness of chest. Pt is currently on 6LNC at home at all times. On ems arrival pt was 81% on 6Lnc had diminished  lung sounds with wheezing and was started on 4m albuterol. Pt arrives to ED on neb treatment able to speak in complete, pt does appear pale, HR 150's irregular.

## 2016-11-18 NOTE — Code Documentation (Signed)
Pt alert to verbal stimulus, able to follow commands. Pt still remains altered not opening eyes.

## 2016-11-18 NOTE — ED Notes (Addendum)
With this RN at bedside pt began to have groaning and became unresponsive. MD yelverton called to bedside, pt maintain weak thready pulse hr in 50's, 78m epi given and hr return to afib 150's. Pt remained unresponsive and began bagging pt to assist ventilations. Pt was also incontinent during this episode.

## 2016-11-19 ENCOUNTER — Other Ambulatory Visit (HOSPITAL_COMMUNITY): Payer: Medicare Other

## 2016-11-19 ENCOUNTER — Encounter (HOSPITAL_COMMUNITY): Payer: Self-pay

## 2016-11-19 ENCOUNTER — Inpatient Hospital Stay (HOSPITAL_COMMUNITY): Payer: Medicare Other

## 2016-11-19 DIAGNOSIS — J9621 Acute and chronic respiratory failure with hypoxia: Secondary | ICD-10-CM

## 2016-11-19 LAB — POCT I-STAT 3, ART BLOOD GAS (G3+)
Acid-base deficit: 2 mmol/L (ref 0.0–2.0)
Bicarbonate: 23.7 mmol/L (ref 20.0–28.0)
Bicarbonate: 25.1 mmol/L (ref 20.0–28.0)
O2 SAT: 99 %
O2 Saturation: 93 %
PCO2 ART: 41.2 mmHg (ref 32.0–48.0)
PH ART: 7.338 — AB (ref 7.350–7.450)
Patient temperature: 98.2
Patient temperature: 98.7
TCO2: 25 mmol/L (ref 0–100)
TCO2: 26 mmol/L (ref 0–100)
pCO2 arterial: 44.2 mmHg (ref 32.0–48.0)
pH, Arterial: 7.393 (ref 7.350–7.450)
pO2, Arterial: 169 mmHg — ABNORMAL HIGH (ref 83.0–108.0)
pO2, Arterial: 68 mmHg — ABNORMAL LOW (ref 83.0–108.0)

## 2016-11-19 LAB — CBC
HCT: 37.2 % (ref 36.0–46.0)
Hemoglobin: 10.7 g/dL — ABNORMAL LOW (ref 12.0–15.0)
MCH: 28.6 pg (ref 26.0–34.0)
MCHC: 28.8 g/dL — AB (ref 30.0–36.0)
MCV: 99.5 fL (ref 78.0–100.0)
PLATELETS: 136 10*3/uL — AB (ref 150–400)
RBC: 3.74 MIL/uL — AB (ref 3.87–5.11)
RDW: 16.3 % — AB (ref 11.5–15.5)
WBC: 25 10*3/uL — ABNORMAL HIGH (ref 4.0–10.5)

## 2016-11-19 LAB — COMPREHENSIVE METABOLIC PANEL
ALK PHOS: 154 U/L — AB (ref 38–126)
ALT: 2759 U/L — AB (ref 14–54)
AST: 4890 U/L — ABNORMAL HIGH (ref 15–41)
Albumin: 3 g/dL — ABNORMAL LOW (ref 3.5–5.0)
Anion gap: 15 (ref 5–15)
BUN: 38 mg/dL — AB (ref 6–20)
CALCIUM: 8.1 mg/dL — AB (ref 8.9–10.3)
CO2: 21 mmol/L — AB (ref 22–32)
CREATININE: 1.58 mg/dL — AB (ref 0.44–1.00)
Chloride: 103 mmol/L (ref 101–111)
GFR calc non Af Amer: 30 mL/min — ABNORMAL LOW (ref >60)
GFR, EST AFRICAN AMERICAN: 35 mL/min — AB (ref >60)
Glucose, Bld: 76 mg/dL (ref 65–99)
Potassium: 4.3 mmol/L (ref 3.5–5.1)
SODIUM: 139 mmol/L (ref 135–145)
Total Bilirubin: 3.4 mg/dL — ABNORMAL HIGH (ref 0.3–1.2)
Total Protein: 5.4 g/dL — ABNORMAL LOW (ref 6.5–8.1)

## 2016-11-19 LAB — BLOOD GAS, ARTERIAL
Acid-base deficit: 3 mmol/L — ABNORMAL HIGH (ref 0.0–2.0)
Bicarbonate: 21.9 mmol/L (ref 20.0–28.0)
DRAWN BY: 24513
FIO2: 0.4
MECHVT: 360 mL
O2 Saturation: 97.1 %
PATIENT TEMPERATURE: 98.6
PCO2 ART: 41.8 mmHg (ref 32.0–48.0)
PEEP: 5 cmH2O
PO2 ART: 107 mmHg (ref 83.0–108.0)
RATE: 24 resp/min
pH, Arterial: 7.338 — ABNORMAL LOW (ref 7.350–7.450)

## 2016-11-19 LAB — CORTISOL: Cortisol, Plasma: 69.2 ug/dL

## 2016-11-19 LAB — BASIC METABOLIC PANEL
Anion gap: 15 (ref 5–15)
BUN: 40 mg/dL — ABNORMAL HIGH (ref 6–20)
CALCIUM: 7.3 mg/dL — AB (ref 8.9–10.3)
CO2: 22 mmol/L (ref 22–32)
CREATININE: 1.62 mg/dL — AB (ref 0.44–1.00)
Chloride: 102 mmol/L (ref 101–111)
GFR, EST AFRICAN AMERICAN: 34 mL/min — AB (ref >60)
GFR, EST NON AFRICAN AMERICAN: 29 mL/min — AB (ref >60)
Glucose, Bld: 109 mg/dL — ABNORMAL HIGH (ref 65–99)
Potassium: 4 mmol/L (ref 3.5–5.1)
SODIUM: 139 mmol/L (ref 135–145)

## 2016-11-19 LAB — COOXEMETRY PANEL
CARBOXYHEMOGLOBIN: 1.4 % (ref 0.5–1.5)
METHEMOGLOBIN: 0.8 % (ref 0.0–1.5)
O2 SAT: 70.3 %
TOTAL HEMOGLOBIN: 10 g/dL — AB (ref 12.0–16.0)

## 2016-11-19 LAB — MAGNESIUM
MAGNESIUM: 2 mg/dL (ref 1.7–2.4)
MAGNESIUM: 1.7 mg/dL (ref 1.7–2.4)

## 2016-11-19 LAB — GLUCOSE, CAPILLARY
GLUCOSE-CAPILLARY: 76 mg/dL (ref 65–99)
GLUCOSE-CAPILLARY: 79 mg/dL (ref 65–99)
GLUCOSE-CAPILLARY: 84 mg/dL (ref 65–99)
Glucose-Capillary: 103 mg/dL — ABNORMAL HIGH (ref 65–99)
Glucose-Capillary: 83 mg/dL (ref 65–99)
Glucose-Capillary: 83 mg/dL (ref 65–99)
Glucose-Capillary: 99 mg/dL (ref 65–99)

## 2016-11-19 LAB — ACETAMINOPHEN LEVEL: Acetaminophen (Tylenol), Serum: <10 ug/mL — ABNORMAL LOW (ref 10–30)

## 2016-11-19 LAB — ECHOCARDIOGRAM COMPLETE
HEIGHTINCHES: 59 in
WEIGHTICAEL: 1968 [oz_av]

## 2016-11-19 LAB — HEPARIN LEVEL (UNFRACTIONATED)
HEPARIN UNFRACTIONATED: 0.47 [IU]/mL (ref 0.30–0.70)
Heparin Unfractionated: 0.49 IU/mL (ref 0.30–0.70)

## 2016-11-19 LAB — PHOSPHORUS: Phosphorus: 5.9 mg/dL — ABNORMAL HIGH (ref 2.5–4.6)

## 2016-11-19 LAB — PROTIME-INR
INR: 5.6
PROTHROMBIN TIME: 52.4 s — AB (ref 11.4–15.2)

## 2016-11-19 LAB — SALICYLATE LEVEL: Salicylate Lvl: <7 mg/dL (ref 2.8–30.0)

## 2016-11-19 LAB — APTT
APTT: 77 s — AB (ref 24–36)
aPTT: 40 seconds — ABNORMAL HIGH (ref 24–36)
aPTT: 43 seconds — ABNORMAL HIGH (ref 24–36)

## 2016-11-19 LAB — STREP PNEUMONIAE URINARY ANTIGEN: Strep Pneumo Urinary Antigen: POSITIVE — AB

## 2016-11-19 LAB — PROCALCITONIN: PROCALCITONIN: 0.65 ng/mL

## 2016-11-19 MED ORDER — SODIUM CHLORIDE 0.9 % IV SOLN
0.0000 ug/min | INTRAVENOUS | Status: DC
  Administered 2016-11-19 – 2016-11-20 (×2): 400 ug/min via INTRAVENOUS
  Administered 2016-11-20: 175 ug/min via INTRAVENOUS
  Administered 2016-11-20 (×2): 400 ug/min via INTRAVENOUS
  Administered 2016-11-20: 325 ug/min via INTRAVENOUS
  Filled 2016-11-19 (×7): qty 8

## 2016-11-19 MED ORDER — DEXTROSE 5 % IV SOLN
5.0000 mg/h | Freq: Once | INTRAVENOUS | Status: AC
  Administered 2016-11-19: 5 mg/h via INTRAVENOUS
  Filled 2016-11-19: qty 100

## 2016-11-19 MED ORDER — AMIODARONE HCL IN DEXTROSE 360-4.14 MG/200ML-% IV SOLN
60.0000 mg/h | INTRAVENOUS | Status: DC
  Administered 2016-11-19 – 2016-11-20 (×2): 60 mg/h via INTRAVENOUS
  Filled 2016-11-19 (×2): qty 200

## 2016-11-19 MED ORDER — PANTOPRAZOLE SODIUM 40 MG IV SOLR
40.0000 mg | INTRAVENOUS | Status: DC
  Administered 2016-11-19: 40 mg via INTRAVENOUS
  Filled 2016-11-19: qty 40

## 2016-11-19 MED ORDER — DEXTROSE 5 % IV SOLN
1.0000 g | INTRAVENOUS | Status: DC
  Administered 2016-11-19 – 2016-11-24 (×6): 1 g via INTRAVENOUS
  Filled 2016-11-19 (×7): qty 10

## 2016-11-19 MED ORDER — IPRATROPIUM-ALBUTEROL 0.5-2.5 (3) MG/3ML IN SOLN
3.0000 mL | Freq: Four times a day (QID) | RESPIRATORY_TRACT | Status: DC
  Administered 2016-11-19 – 2016-11-20 (×4): 3 mL via RESPIRATORY_TRACT
  Filled 2016-11-19 (×4): qty 3

## 2016-11-19 MED ORDER — DEXTROSE 5 % IV SOLN
0.0000 ug/min | INTRAVENOUS | Status: DC
  Administered 2016-11-19: 10 ug/min via INTRAVENOUS
  Administered 2016-11-19: 15 ug/min via INTRAVENOUS
  Administered 2016-11-20 (×2): 10 ug/min via INTRAVENOUS
  Administered 2016-11-21: 15 ug/min via INTRAVENOUS
  Administered 2016-11-21: 9 ug/min via INTRAVENOUS
  Filled 2016-11-19 (×7): qty 4

## 2016-11-19 MED ORDER — SODIUM CHLORIDE 0.9% FLUSH
10.0000 mL | Freq: Two times a day (BID) | INTRAVENOUS | Status: DC
  Administered 2016-11-19 – 2016-11-24 (×11): 10 mL

## 2016-11-19 MED ORDER — MIDAZOLAM HCL 2 MG/2ML IJ SOLN
2.0000 mg | Freq: Once | INTRAMUSCULAR | Status: AC
  Administered 2016-11-18: 2 mg via INTRAVENOUS

## 2016-11-19 MED ORDER — SODIUM CHLORIDE 0.9 % IV SOLN
250.0000 mL | INTRAVENOUS | Status: DC
  Administered 2016-11-19: 250 mL via INTRAVENOUS
  Administered 2016-11-20: 1000 mL via INTRAVENOUS

## 2016-11-19 MED ORDER — CHLORHEXIDINE GLUCONATE CLOTH 2 % EX PADS
6.0000 | MEDICATED_PAD | Freq: Every day | CUTANEOUS | Status: DC
  Administered 2016-11-20 – 2016-11-23 (×4): 6 via TOPICAL

## 2016-11-19 MED ORDER — VASOPRESSIN 20 UNIT/ML IV SOLN
0.0300 [IU]/min | INTRAVENOUS | Status: DC
  Administered 2016-11-19 – 2016-11-23 (×6): 0.03 [IU]/min via INTRAVENOUS
  Filled 2016-11-19 (×6): qty 2

## 2016-11-19 MED ORDER — AMIODARONE LOAD VIA INFUSION
150.0000 mg | Freq: Once | INTRAVENOUS | Status: AC
  Administered 2016-11-19: 150 mg via INTRAVENOUS
  Filled 2016-11-19: qty 83.34

## 2016-11-19 MED ORDER — AMIODARONE HCL IN DEXTROSE 360-4.14 MG/200ML-% IV SOLN
30.0000 mg/h | INTRAVENOUS | Status: DC
  Administered 2016-11-20: 30 mg/h via INTRAVENOUS
  Filled 2016-11-19: qty 200

## 2016-11-19 MED ORDER — HEPARIN (PORCINE) IN NACL 100-0.45 UNIT/ML-% IJ SOLN
700.0000 [IU]/h | INTRAMUSCULAR | Status: DC
  Administered 2016-11-19: 700 [IU]/h via INTRAVENOUS
  Filled 2016-11-19: qty 250

## 2016-11-19 MED ORDER — CHLORHEXIDINE GLUCONATE 0.12% ORAL RINSE (MEDLINE KIT)
15.0000 mL | Freq: Two times a day (BID) | OROMUCOSAL | Status: DC
  Administered 2016-11-19: 15 mL via OROMUCOSAL

## 2016-11-19 MED ORDER — ATORVASTATIN CALCIUM 40 MG PO TABS: 40.0000 mg | ORAL_TABLET | Freq: Every day | ORAL | Status: DC

## 2016-11-19 MED ORDER — APIXABAN 2.5 MG PO TABS: 2.5000 mg | ORAL_TABLET | Freq: Two times a day (BID) | ORAL | Status: DC

## 2016-11-19 MED ORDER — DIGOXIN 0.25 MG/ML IJ SOLN
0.5000 mg | Freq: Once | INTRAMUSCULAR | Status: AC
  Administered 2016-11-19: 0.5 mg via INTRAVENOUS
  Filled 2016-11-19: qty 2

## 2016-11-19 MED ORDER — FUROSEMIDE 10 MG/ML IJ SOLN
60.0000 mg | Freq: Once | INTRAMUSCULAR | Status: AC
  Administered 2016-11-19: 60 mg via INTRAVENOUS
  Filled 2016-11-19: qty 6

## 2016-11-19 MED ORDER — SODIUM CHLORIDE 0.9% FLUSH: 10.0000 mL | INTRAVENOUS | Status: DC | PRN

## 2016-11-19 MED ORDER — ORAL CARE MOUTH RINSE
15.0000 mL | Freq: Four times a day (QID) | OROMUCOSAL | Status: DC
  Administered 2016-11-19 – 2016-11-20 (×4): 15 mL via OROMUCOSAL

## 2016-11-19 NOTE — Progress Notes (Signed)
 Progress Note  Patient Name: Cristina Parker Date of Encounter: 11/19/2016  Primary Cardiologist: Dr. Aneka Fagerstrom  Subjective   Pt intubated, appears comfortable. Denies chest pain.  Inpatient Medications    Scheduled Meds: . apixaban  2.5 mg Oral BID  . apixaban  2.5 mg Oral Once  . atorvastatin  40 mg Oral Daily  . chlorhexidine gluconate (MEDLINE KIT)  15 mL Mouth Rinse BID  . escitalopram  20 mg Per Tube Daily  . famotidine  20 mg Per Tube QHS  . fluticasone furoate-vilanterol  1 puff Inhalation Daily  . furosemide  20 mg Intravenous Q12H  . mouth rinse  15 mL Mouth Rinse QID  . metoprolol tartrate  25 mg Oral BID  . pantoprazole  40 mg Oral Daily  . sodium chloride flush  3 mL Intravenous Q12H   Continuous Infusions: . sodium chloride    . norepinephrine (LEVOPHED) Adult infusion 15 mcg/min (11/19/16 0758)  . phenylephrine (NEO-SYNEPHRINE) Adult infusion 50.133 mcg/min (11/19/16 0600)  . vasopressin (PITRESSIN) infusion - *FOR SHOCK* 0.03 Units/min (11/19/16 0600)   PRN Meds: sodium chloride, acetaminophen, fentaNYL (SUBLIMAZE) injection, fentaNYL (SUBLIMAZE) injection, midazolam, midazolam, ondansetron **OR** ondansetron (ZOFRAN) IV, ondansetron (ZOFRAN) IV, sennosides   Vital Signs    Vitals:   11/19/16 0630 11/19/16 0645 11/19/16 0747 11/19/16 0824  BP:      Pulse: 92 94    Resp: (!) 25 (!) 21    Temp:   97.5 F (36.4 C)   TempSrc:   Axillary   SpO2: 98% 99%  100%  Weight:      Height:        Intake/Output Summary (Last 24 hours) at 11/19/16 0849 Last data filed at 11/19/16 0600  Gross per 24 hour  Intake             1694 ml  Output               80 ml  Net             1614 ml   Filed Weights   12/03/2016 1300 11/19/16 0500  Weight: 123 lb (55.8 kg) 128 lb 4.8 oz (58.2 kg)    Telemetry    Atrial fibrillation 80's-90's - Personally Reviewed  ECG    Atrial fibrillation 152 bpm - Personally Reviewed  Physical Exam   GEN:  elderly female,  intubated Neck: No JVD Cardiac: RRR, no murmurs, rubs, or gallops.  Respiratory: Clear to auscultation bilaterally. GI: Soft, nontender, non-distended  MS: No edema; No deformity. Neuro:  Nonfocal  Psych: Normal affect   Labs    Chemistry Recent Labs Lab 12/04/2016 0755 12/15/2016 1441 11/21/2016 1954 11/19/16 0452  NA 141 141  --  139  K 5.3* 4.4  --  4.3  CL 98* 100*  --  103  CO2 23 23  --  21*  GLUCOSE 87 126*  --  76  BUN 32* 31*  --  38*  CREATININE 1.07* 1.23* 1.25* 1.58*  CALCIUM 9.2 8.4*  --  8.1*  PROT 6.2*  --   --  5.4*  ALBUMIN 3.4*  --   --  3.0*  AST 1,238*  --   --  4,890*  ALT 823*  --   --  2,759*  ALKPHOS 150*  --   --  154*  BILITOT 2.2*  --   --  3.4*  GFRNONAA 48* 41* 40* 30*  GFRAA 56* 47* 46* 35*  ANIONGAP 20* 18*  --    15     Hematology Recent Labs Lab 11/17/2016 0755 11/19/16 0452  WBC 11.3* 25.0*  RBC 3.62* 3.74*  HGB 10.5* 10.7*  HCT 36.1 37.2  MCV 99.7 99.5  MCH 29.0 28.6  MCHC 29.1* 28.8*  RDW 16.1* 16.3*  PLT 177 136*    Cardiac Enzymes Recent Labs Lab 12/15/2016 1441 12/12/2016 1954  TROPONINI 0.90* 1.53*    Recent Labs Lab 11/27/2016 0815  TROPIPOC 0.46*     BNP Recent Labs Lab 11/19/2016 0758  BNP 1,106.7*     DDimer No results for input(s): DDIMER in the last 168 hours.   Radiology    Ct Head Wo Contrast  Result Date: 12/07/2016 CLINICAL DATA:  Altered mental status EXAM: CT HEAD WITHOUT CONTRAST TECHNIQUE: Contiguous axial images were obtained from the base of the skull through the vertex without intravenous contrast. COMPARISON:  08/17/2013 FINDINGS: Brain: Diffuse atrophic changes are noted. Mild chronic white matter ischemic change is noted and stable. No findings to suggest acute hemorrhage, acute infarction or space-occupying mass lesion are noted. Mild basal ganglia calcifications are seen bilaterally. Vascular: No hyperdense vessel or unexpected calcification. Skull: Normal. Negative for fracture or focal lesion.  Sinuses/Orbits: No acute finding. Other: Nasogastric catheter is noted coiled within the posterior nasopharynx. IMPRESSION: Chronic ski make and atrophic changes without acute abnormality. Nasogastric catheter coiled within the posterior nasopharynx Electronically Signed   By: Mark  Lukens M.D.   On: 12/13/2016 16:28   Dg Chest Port 1 View  Result Date: 11/19/2016 CLINICAL DATA:  Hypoxia EXAM: PORTABLE CHEST 1 VIEW COMPARISON:  November 18, 2016 FINDINGS: Endotracheal tube tip is 1.2 cm above the carina. Nasogastric tube tip and side port are below the diaphragm. Central catheter tip is in the superior vena cava. No pneumothorax. There are pleural effusions bilaterally with underlying interstitial edema. There is atelectatic change in the lung bases. Heart is mildly enlarged with pulmonary vascularity within normal limits. There is aortic atherosclerosis. No adenopathy evident. There is postoperative change in the lower cervical spine. IMPRESSION: Tube and catheter positions as described without pneumothorax. The endotracheal tube tip is near the carina; advise withdrawing endotracheal tube approximately 2 cm. Evidence a degree of underlying congestive heart failure. Bibasilar atelectasis. There is aortic atherosclerosis. Electronically Signed   By: William  Woodruff III M.D.   On: 11/19/2016 07:06   Dg Chest Portable 1 View  Result Date: 12/11/2016 CLINICAL DATA:  Central line placement EXAM: PORTABLE CHEST 1 VIEW COMPARISON:  12/13/2016 FINDINGS: Left central line tip is in the upper right atrium. No pneumothorax. Cardiomegaly. Bilateral layering effusions and bilateral airspace disease, likely edema/ CHF. NG tube and endotracheal tube are unchanged. Left central line tip in the upper right atrium. No pneumothorax. IMPRESSION: Continued bilateral airspace disease and layering effusions, likely edema/CHF. Electronically Signed   By: Kevin  Dover M.D.   On: 12/03/2016 16:51   Dg Chest Port 1 View  Result Date:  12/09/2016 CLINICAL DATA:  Patient status post intubation. EXAM: PORTABLE CHEST 1 VIEW COMPARISON:  Chest radiograph 12/10/2016 FINDINGS: ET tube terminates in the mid trachea. Enteric tube tip and side-port project over the stomach. Pacer apparatus overlies the left hemithorax. Patient status post median sternotomy. Low lung volumes. Persistent moderate right and small left pleural effusions with underlying pulmonary consolidation which appears worsened within the right greater than left mid and lower lungs. No pneumothorax. Osseous destruction right humeral head. IMPRESSION: ET tube terminates in the mid trachea. Worsening moderate right and small left pleural effusions   with increasing bilateral underlying heterogeneous pulmonary opacities which may represent atelectasis or infection. Electronically Signed   By: Drew  Davis M.D.   On: 11/27/2016 14:17   Dg Chest Port 1 View  Result Date: 12/14/2016 CLINICAL DATA:  Shortness of breath for 1 day. EXAM: PORTABLE CHEST 1 VIEW COMPARISON:  06/20/2016 and prior exams FINDINGS: Cardiomegaly and CABG changes noted. A new moderate right pleural effusion noted with right lower lung atelectasis. A small left pleural effusion has enlarged with associated left basilar atelectasis. There is no evidence of pneumothorax. No acute bony abnormality noted. Severe right shoulder degenerative changes and cervical spine surgical changes noted. IMPRESSION: New moderate right pleural effusion and right lower lung atelectasis. Slightly increased small left pleural effusion with left basilar atelectasis. Cardiomegaly and CABG changes. Electronically Signed   By: Jeffrey  Hu M.D.   On: 12/02/2016 08:27   Us Abdomen Limited Ruq  Result Date: 11/19/2016 CLINICAL DATA:  Transaminitis EXAM: US ABDOMEN LIMITED - RIGHT UPPER QUADRANT COMPARISON:  CT scan the abdomen pelvis of January 01, 2014 FINDINGS: The study was done portably. The patient is intubated. Defibrillation pads limit the  acoustical window. Gallbladder: The gallbladder is adequately distended. There is gallbladder wall thickening to 11.6 mm. There is pericholecystic fluid. There is no positive sonographic Murphy's sign. No stones or sludge are evident. Common bile duct: Diameter: 3.8 mm Liver: The hepatic echotexture is heterogeneous. There is no focal mass nor ductal dilation. IMPRESSION: Gallbladder wall thickening and pericholecystic fluid may reflect acalculous cholecystitis. Heterogeneous hepatic echotexture is nonspecific. Normal appearing common bile duct. Electronically Signed   By: David  Jordan M.D.   On: 11/19/2016 07:05    Cardiac Studies   Echo 11/20/2016 Study Conclusions  - Left ventricle: The cavity size was normal. Wall thickness was   normal. Systolic function was severely reduced. The estimated   ejection fraction was in the range of 25% to 30%. Dyskinesis of   the entireanteroseptal and apical myocardium. - Mitral valve: Mildly calcified annulus. There was moderate   regurgitation. - Left atrium: The atrium was mildly dilated. - Right ventricle: Systolic function was mildly to moderately   reduced. - Right atrium: The atrium was mildly dilated. - Tricuspid valve: There was moderate regurgitation. - Pulmonary arteries: Systolic pressure was moderately increased.   PA peak pressure: 56 mm Hg (S).   Patient Profile     81 y.o. female with coronary artery disease status post CABG with LIMA to LAD in 2005 with combined peripheral vascular disease innominate bypass for severe arch disease with prior right carotid endarterectomy in 1999, extensive left subclavian disease with COPD, Mod PAH, history of paroxysmal atrial fibrillation and HTN here respiratory distress and atrial fibrillation and rapid ventricular response with markedly elevated liver enzymes, AST 1200, ALT 800 currently not on anticoagulation. Had seizure in ED, hypoglycemia with gluc in the 40, hypotension and hypoxia and was  intubated. Now with multisystem organ failure.   Assessment & Plan    Atrial fibrillation with RVR -Rates in the 130's overnight with hypotension, cardizem started and one dose of digoxin given in early AM for atrial fib RVR, is on vasopressin and levophed for hypotension. HR now down to the 80's-90's. Amiodarone is not being used in setting of liver dysfunction. She was also started on metoprolol 25 mg bid. -Discussed with PCCM, NP and Would try to avoid treatment that lowers blood pressure as this increases her need for pressors which will increase heart rate.  -CHA2DS2/VAS Stroke   Risk Score is at least 5 (vascular disease, HTN, Age (2), female). Pt was started on Apixaban but LFT's severely elevated and INR greater than 5. Apixaban discontinued. Consider heparin, but may need to put off in setting of liver dysfunction and elevated INR.  -Troponin is 0.46 and trending mildly up -->0.90, 1.53. Possible demand ischemia in setting of atrial fib with RVR and hypoperfusion, but will assess overall picture. Pt had not chest pain on presentation, per notes, prior to intubation and she denies chest pain now.   Acute heart failure -CXR showed pulmonary edema, BNP 1106.7 -EF in 10/2015 was 50-55%, now 25% to 30% with Dyskinesis of the entireanteroseptal and apical myocardium and PA peak pressure 56 mmHg, done while pt in afib with RVR.  -Right heart cath 11/2015 showed moderate PAH, borderline elevated PCWP and mildly elevated RA pressure. -Is on lasix 20 mg IV bid, has received 2 doses plus an extra 60 mg at 3 am for low UOP, with only slight increase in UOP. Wt 123->128. Lungs are clear and no significant edema. Hopefully will see improvement with control of heart rate.  -CVP had been 17 overnight, now 10-12 range.  -Question of whether hepatic dysfunction contributed to atrial fib and fluid overload or afib with RVR in setting of Mod PAH and COPD with high oxygen needs led to hypoperfusion of the liver.     Altered liver function -PCCM is checking hepatic ultrasound -Avoiding hepatotoxic meds  Acute renal insufficiency -Scr 1.23-->1.58, in setting of diuresis and hypoperfusion  Leukocytosis -WBC's 11.3-->25K. Could be reactionary, gut PCCM is working up.  Signed, Janine Hammond, NP  11/19/2016, 8:49 AM    Patient examined chart reviewed:  Exam with intubated female post RCEA with supraclavicular bruits Rhonchi. Known pulmonary hypertension PVD with poor capillary refill. Admitted with respiratory distress , CHF, hypotension low BS and ? Seizure Echo with EF 25-30% was 50-55% 11/03/15 ECG with no acute changes Chronic afib on eliquis Troponin 1.53.  Will likely need cath after multi system failure improved and extubated Hold DOAC and start heparin still on pressors  BP usually lower in left arm due to arch vessel disease      

## 2016-11-19 NOTE — Progress Notes (Signed)
Hypoglycemic Event  CBG: 59  Treatment: D50 IV 25 mL  Symptoms: None  Follow-up CBG: Time:0033 CBG Result:89  Possible Reasons for Event: Unknown  Unable to check CBG per hypoglycemic protocol r/t assisting MD with arterial line insertion  Laurelin Elson S Norwin Aleman

## 2016-11-19 NOTE — Progress Notes (Signed)
BP low with max dose of neo. MAP currently 62. No response to lasix at this time, current cvp 17. Dr. Titus Mould notified. Ordered vasopressin. Will continue to monitor

## 2016-11-19 NOTE — Progress Notes (Signed)
PULMONARY / CRITICAL CARE MEDICINE   Name: Cristina Parker MRN: [id number] DOB: 07-26-1935    ADMISSION DATE:  11/29/2016 CONSULTATION DATE:  11/24/2016  REFERRING MD:  Dr. Posey Pronto   CHIEF COMPLAINT:  Dyspnea   Brief:   81-yar-old female with h/o Afib, PAH on macitentan per Dr Precious Haws, HTN, CAD s/p cabg, PAH, EF 50-55 and PVD    Presents to ED on  6/3 with ongoing shortness of breath for past few days and lower exrmity edma and weight gain.She arrived in the ED in resp distress. O2 sat 81% on 6L [place] with wheezes and in A-Fib with RVR with HR in 150s. She was started on albuterol and cardizem for A-fib with RVR. However, her sbp dropped to 70s and cardizem was stopped. With concern that she was fluid up, she only received a 750 mL NS bolus.  CXR showed bilateral pleural effusions with R>L and cardiomegaly.    Admitted to the step down unit and was admitted by internal medicine team. While in  ER, she had an episode which is suspected to be seizures. She was also found to be hypoglycemic with BS in 40s, and was  intubated.   SUBJECTIVE:  Remains on Neo, Vasopressin, and Levophed Gtt.   VITAL SIGNS: BP (!) 77/57   Pulse 94   Temp 97.5 F (36.4 C) (Axillary)   Resp (!) 21   Ht _0  (1.499 m)   Wt 58.2 kg (128 lb 4.8 oz)   SpO2 100%   BMI 25.91 kg/m   HEMODYNAMICS: CVP:  [7 mmHg-17 mmHg] 12 mmHg  VENTILATOR SETTINGS: Vent Mode: PSV;CPAP FiO2 (%):  [40 %-100 %] 40 % Set Rate:  [24 bmp] 24 bmp Vt Set:  [350 mL] 350 mL PEEP:  [5 cmH20] 5 cmH20 Pressure Support:  [5 cmH20] 5 cmH20 Plateau Pressure:  [14 cmH20-21 cmH20] 16 cmH20  INTAKE / OUTPUT: I/O last 3 completed shifts: In: 1695.8 [I.V.:945.8; IV Piggyback:750] Out: 4 [Urine:30; Emesis/NG output:50]  PHYSICAL EXAMINATION: General appearance:  elderly female, no distress Eyes:  PERRL, EOMI bilaterally. Mouth:  membranes and no mucosal ulcerations; ETT in place  Neck: Trachea midline; neck supple, no JVD Lungs/chest:  CTA, with normal respiratory effort, no wheeze/no crackles  CV: A.Fib, no MRGs  Abdomen: Soft, non-tender; no masses or HSM Extremities: No peripheral edema Skin: Normal temperature, turgor and texture Psych: Alert, follows commands, grossly intact   LABS:  BMET  Recent Labs Lab 11/22/2016 0755 11/24/2016 1441 12/05/2016 1954 11/19/16 0452  NA 141 141  --  139  K 5.3* 4.4  --  4.3  CL 98* 100*  --  103  CO2 23 23  --  21*  BUN 32* 31*  --  38*  CREATININE 1.07* 1.23* 1.25* 1.58*  GLUCOSE 87 126*  --  76    Electrolytes  Recent Labs Lab 12/04/2016 0755 12/01/2016 1441 11/19/16 0452  CALCIUM 9.2 8.4* 8.1*  MG  --   --  2.0  PHOS  --   --  5.9*    CBC  Recent Labs Lab 11/28/2016 0755 11/19/16 0452  WBC 11.3* 25.0*  HGB 10.5* 10.7*  HCT 36.1 37.2  PLT 177 136*    Coag's  Recent Labs Lab 12/09/2016 0755 11/19/16 0452  APTT  --  40*  INR 1.93 5.60*    Sepsis Markers  Recent Labs Lab 12/13/2016 1538 11/19/16 0452  PROCALCITON 0.12 0.65    ABG  Recent Labs Lab 11/30/2016 1435 11/19/16 0037  11/19/16 0352  PHART 7.225* 7.338* 7.338*  PCO2ART 61.8* 44.2 41.8  PO2ART 22.0* 169.0* 107    Liver Enzymes  Recent Labs Lab 11/20/2016 0755 11/19/16 0452  AST 1,238* 4,890*  ALT 823* 2,759*  ALKPHOS 150* 154*  BILITOT 2.2* 3.4*  ALBUMIN 3.4* 3.0*    Cardiac Enzymes  Recent Labs Lab 11/23/2016 1441 12/06/2016 1954  TROPONINI 0.90* 1.53*    Glucose  Recent Labs Lab 12/06/2016 1510 11/24/2016 1940 11/17/2016 2332 11/19/16 0033 11/19/16 0444 11/19/16 0746  GLUCAP 112* 91 59* 84 76 103*    Imaging Ct Head Wo Contrast  Result Date: 11/17/2016 CLINICAL DATA:  Altered mental status EXAM: CT HEAD WITHOUT CONTRAST TECHNIQUE: Contiguous axial images were obtained from the base of the skull through the vertex without intravenous contrast. COMPARISON:  08/17/2013 FINDINGS: Brain: Diffuse atrophic changes are noted. Mild chronic white matter ischemic change is  noted and stable. No findings to suggest acute hemorrhage, acute infarction or space-occupying mass lesion are noted. Mild basal ganglia calcifications are seen bilaterally. Vascular: No hyperdense vessel or unexpected calcification. Skull: Normal. Negative for fracture or focal lesion. Sinuses/Orbits: No acute finding. Other: Nasogastric catheter is noted coiled within the posterior nasopharynx. IMPRESSION: Chronic ski make and atrophic changes without acute abnormality. Nasogastric catheter coiled within the posterior nasopharynx Electronically Signed   By: Inez Catalina M.D.   On: 12/12/2016 16:28   Dg Chest Port 1 View  Result Date: 11/19/2016 CLINICAL DATA:  Hypoxia EXAM: PORTABLE CHEST 1 VIEW COMPARISON:  November 18, 2016 FINDINGS: Endotracheal tube tip is 1.2 cm above the carina. Nasogastric tube tip and side port are below the diaphragm. Central catheter tip is in the superior vena cava. No pneumothorax. There are pleural effusions bilaterally with underlying interstitial edema. There is atelectatic change in the lung bases. Heart is mildly enlarged with pulmonary vascularity within normal limits. There is aortic atherosclerosis. No adenopathy evident. There is postoperative change in the lower cervical spine. IMPRESSION: Tube and catheter positions as described without pneumothorax. The endotracheal tube tip is near the carina; advise withdrawing endotracheal tube approximately 2 cm. Evidence a degree of underlying congestive heart failure. Bibasilar atelectasis. There is aortic atherosclerosis. Electronically Signed   By: Lowella Grip III M.D.   On: 11/19/2016 07:06   Dg Chest Portable 1 View  Result Date: 12/07/2016 CLINICAL DATA:  Central line placement EXAM: PORTABLE CHEST 1 VIEW COMPARISON:  12/12/2016 FINDINGS: Left central line tip is in the upper right atrium. No pneumothorax. Cardiomegaly. Bilateral layering effusions and bilateral airspace disease, likely edema/ CHF. NG tube and endotracheal  tube are unchanged. Left central line tip in the upper right atrium. No pneumothorax. IMPRESSION: Continued bilateral airspace disease and layering effusions, likely edema/CHF. Electronically Signed   By: Rolm Baptise M.D.   On: 12/08/2016 16:51   Dg Chest Port 1 View  Result Date: 12/06/2016 CLINICAL DATA:  Patient status post intubation. EXAM: PORTABLE CHEST 1 VIEW COMPARISON:  Chest radiograph 11/30/2016 FINDINGS: ET tube terminates in the mid trachea. Enteric tube tip and side-port project over the stomach. Pacer apparatus overlies the left hemithorax. Patient status post median sternotomy. Low lung volumes. Persistent moderate right and small left pleural effusions with underlying pulmonary consolidation which appears worsened within the right greater than left mid and lower lungs. No pneumothorax. Osseous destruction right humeral head. IMPRESSION: ET tube terminates in the mid trachea. Worsening moderate right and small left pleural effusions with increasing bilateral underlying heterogeneous pulmonary opacities which may represent atelectasis  or infection. Electronically Signed   By: Lovey Newcomer M.D.   On: 12/07/2016 14:17   US Abdomen Limited Ruq  Result Date: 11/19/2016 CLINICAL DATA:  Transaminitis EXAM: US ABDOMEN LIMITED - RIGHT UPPER QUADRANT COMPARISON:  CT scan the abdomen pelvis of January 01, 2014 FINDINGS: The study was done portably. The patient is intubated. Defibrillation pads limit the acoustical window. Gallbladder: The gallbladder is adequately distended. There is gallbladder wall thickening to 11.6 mm. There is pericholecystic fluid. There is no positive sonographic Murphy's sign. No stones or sludge are evident. Common bile duct: Diameter: 3.8 mm Liver: The hepatic echotexture is heterogeneous. There is no focal mass nor ductal dilation. IMPRESSION: Gallbladder wall thickening and pericholecystic fluid may reflect acalculous cholecystitis. Heterogeneous hepatic echotexture is  nonspecific. Normal appearing common bile duct. Electronically Signed   By: David  Martinique M.D.   On: 11/19/2016 07:05     STUDIES:  CXR 6/3 > New moderate right pleural effusion and right lower lung atelectasis  CT Head 6/3 > Chronic ski make atrophic changes without acute abnormality [place] ABD Limited RUQ 6/4 > Gallbladder wall thickening and pericholecystic fluid may reflect acalculous cholecystitis. Heterogeneous hepatic echotexture is nonspecific. Normal appearing common bile duct. ECHO 6/4 > EF 25-30, severely reduced systolic function, PA pressure 56  CULTURES: Urine 6/3 >> Blood 6/3 >> Sputum 6/4 >>  ANTIBIOTICS: None  SIGNIFICANT EVENTS: 6/3 > presents to ED   LINES/TUBES: ETT 6/3 >> CVC Left IJ 6/3 >> Left Axillary Aline 6/4 >>  DISCUSSION: 81 year old female presents to ED with progressive dyspnea. Intubated in ED. CXR with right pleural effusion   ASSESSMENT / PLAN:  PULMONARY A: Acute on Chronic Hypoxic Respiratory Failure  H/O COPD on 6L [place]  P:   Vent Support VAP protocol  Wean as tolerated - weaned for one hour 6/4 Scheduled duoneb  Maintain Oxygenation >88 Trend CXR   CARDIOVASCULAR A:  Cardiogenic Shock  A.Fib RVR Elevated Trop Acute on Chronic Systolic/Diasolitc Heart Failure (EF 20-25) H/O Pulmonary HTN, CAD, PVD P:  Cardiology Following  Maintain MAP >65  Currently on Levophed, Vasopressin, and Neo gtt  Cardizem gtt on hold (HR 90-100)  D/C Eliquis in setting of increased LFT > switch to heparin gtt  Lasix on hold due to increased pressor requirements   RENAL A:    Hyperkalemia - improved  P:   Trend BMP   GASTROINTESTINAL A:   Transaminitis  -US Liver no acute  P:   NPO TF Trend LFT PPI  Acute Hep panel pending   HEMATOLOGIC A:   No issues  P:  Trend CBC Trend INR   INFECTIOUS A:   No issues  P:   Trend WBC and Fever Curve  Follow culture data  ENDOCRINE A:   Hypoglycemia    P:   Trend Glucose    NEUROLOGIC A:   Acute Encephalopathy in setting of sedation  ?Seizure in ED r/t hypoglycemia  P:   RASS goal: 0/-1 Monitor  Versed/Fentanly PRN    FAMILY  - Updates: no family   - Inter-disciplinary family meet or Palliative Care meeting due by: 6/10 (Patient wishes for no long term ventilation and is a DNR    CC Time: 42 minutes   Hayden Pedro, AGACNP-BC [facility]  Pgr: [phone]  PCCM Pgr: [phone]

## 2016-11-19 NOTE — Progress Notes (Signed)
ANTICOAGULATION CONSULT NOTE  Pharmacy Consult for Heparin Indication: atrial fibrillation  No Known Allergies  Patient Measurements: Height: 4' 11" (149.9 cm) Weight: 128 lb 4.8 oz (58.2 kg) IBW/kg (Calculated) : 43.2 Heparin Dosing Weight: 58 kg  Vital Signs: Temp: 97 F (36.1 C) (06/04 1652) Temp Source: Oral (06/04 1652) BP: 89/76 (06/04 2000) Pulse Rate: 132 (06/04 2000)  Labs:  Recent Labs  11/20/2016 0755 11/26/2016 1441 11/30/2016 1954 11/19/16 0452 11/19/16 1214 11/19/16 2041  HGB 10.5*  --   --  10.7*  --   --   HCT 36.1  --   --  37.2  --   --   PLT 177  --   --  136*  --   --   APTT  --   --   --  40* 43*  --   LABPROT 22.3*  --   --  52.4*  --   --   INR 1.93  --   --  5.60*  --   --   HEPARINUNFRC  --   --   --   --  0.49 0.47  CREATININE 1.07* 1.23* 1.25* 1.58*  --   --   TROPONINI  --  0.90* 1.53*  --   --   --     Estimated Creatinine Clearance: 22.4 mL/min (A) (by C-G formula based on SCr of 1.58 mg/dL (H)).   Medical History: Past Medical History:  Diagnosis Date  . Anemia   . Anxiety   . Arthritis   . Blood transfusion without reported diagnosis   . Cataract    BILATERAL  . Centrilobular emphysema ([place])   . Collagenous colitis   . Crohn's disease ([place])   . Depression   . Duodenal stricture   . Duodenal ulcer   . Esophageal reflux   . Heart disease   . Heart murmur   . Hiatal hernia   . Hyperlipemia   . Hypertension   . Osteoporosis   . Paroxysmal A-fib ([place]) 07/09/2011  . Peripheral vascular disease ([place])   . Pneumonia   . Pulmonary hypertension (HCC)     Medications:  Infusions:  . sodium chloride 250 mL (11/19/16 2000)  . cefTRIAXone (ROCEPHIN)  IV Stopped (11/19/16 1625)  . heparin 700 Units/hr (11/19/16 1239)  . norepinephrine (LEVOPHED) Adult infusion 10 mcg/min (11/19/16 1944)  . phenylephrine (NEO-SYNEPHRINE) Adult infusion 400 mcg/min (11/19/16 2116)  . vasopressin (PITRESSIN) infusion - *FOR SHOCK* 0.03 Units/min  (11/19/16 0800)    Assessment: 81 yo female with hx afib, not on chronic anticoagulation.  CHADSVASC at least 5.  Eliquis given 6/3 (1 dose).  Pharmacist requested to hold Eliquis given drastically elevated LFTs.  Will begin anticoagulation with IV heparin for now.  Baseline heparin level falsely elevated (reflection of recent Eliquis dose).  Baseline PTT slightly above normal range.  Initial heparin level and aPTT are therapeutic and correlating on heparin 700 units/hr. Will use heparin level moving forward. No issues with infusion or bleeding noted.  Goal of Therapy:  Heparin level 0.3-0.7 units/ml aPTT 66-102 seconds Monitor platelets by anticoagulation protocol: Yes   Plan:  Continue heparin 700 units/hr Daily heparin level and CBC   Andrey Cota. Diona Foley, PharmD, [place] Clinical Pharmacist [phone]

## 2016-11-19 NOTE — Progress Notes (Signed)
eLink Physician-Brief Progress Note Patient Name: [name] DOB: 02-06-1936 MRN: [id number]   Date of Service  11/19/2016  HPI/Events of Note  AFIB with RVR - Ventricular rate 120's to 140's. Patient is on Phenylephrine, Vasopressin and Norepinephrine for hemodynamic support. Therefore, can't use Cardizem IV infusion. Question of ILD in past, however, now w/u felt not to be c/w ILD.  eICU Interventions  Will order: 1. Amiodarone IV load and infusion. 2. BMP and Mg++ level STAT.     Intervention Category Major Interventions: Arrhythmia - evaluation and management  Sommer,Steven Cornelia Copa 11/19/2016, 9:56 PM

## 2016-11-19 NOTE — Progress Notes (Signed)
ANTICOAGULATION CONSULT NOTE - Initial Consult  Pharmacy Consult for IV heparin Indication: atrial fibrillation  No Known Allergies  Patient Measurements: Height: _0  (149.9 cm) Weight: 128 lb 4.8 oz (58.2 kg) IBW/kg (Calculated) : 43.2 Heparin Dosing Weight: 58 kg  Vital Signs: Temp: 98.1 F (36.7 C) (06/04 1148) Temp Source: Axillary (06/04 1148) BP: 88/68 (06/04 1000) Pulse Rate: 104 (06/04 1100)  Labs:  Recent Labs  12/03/2016 0755 12/07/2016 1441 11/23/2016 1954 11/19/16 0452 11/19/16 1214  HGB 10.5*  --   --  10.7*  --   HCT 36.1  --   --  37.2  --   PLT 177  --   --  136*  --   APTT  --   --   --  40* 43*  LABPROT 22.3*  --   --  52.4*  --   INR 1.93  --   --  5.60*  --   HEPARINUNFRC  --   --   --   --  0.49  CREATININE 1.07* 1.23* 1.25* 1.58*  --   TROPONINI  --  0.90* 1.53*  --   --     Estimated Creatinine Clearance: 22.4 mL/min (A) (by C-G formula based on SCr of 1.58 mg/dL (H)).   Medical History: Past Medical History:  Diagnosis Date  . Anemia   . Anxiety   . Arthritis   . Blood transfusion without reported diagnosis   . Cataract    BILATERAL  . Centrilobular emphysema ([place])   . Collagenous colitis   . Crohn's disease ([place])   . Depression   . Duodenal stricture   . Duodenal ulcer   . Esophageal reflux   . Heart disease   . Heart murmur   . Hiatal hernia   . Hyperlipemia   . Hypertension   . Osteoporosis   . Paroxysmal A-fib ([place]) 07/09/2011  . Peripheral vascular disease ([place])   . Pneumonia   . Pulmonary hypertension (HCC)     Medications:  Infusions:  . sodium chloride    . heparin 700 Units/hr (11/19/16 1239)  . norepinephrine (LEVOPHED) Adult infusion Stopped (11/19/16 0945)  . phenylephrine (NEO-SYNEPHRINE) Adult infusion 400 mcg/min (11/19/16 1159)  . vasopressin (PITRESSIN) infusion - *FOR SHOCK* 0.03 Units/min (11/19/16 0800)    Assessment: 81 yo female with hx afib, not on chronic anticoagulation.  CHADSVASC at least  5.  Eliquis given 6/3 (1 dose).  Pharmacist requested to hold Eliquis given drastically elevated LFTs.  Will begin anticoagulation with IV heparin for now.  Baseline heparin level falsely elevated (reflection of recent Eliquis dose).  Baseline PTT slightly above normal range.  Goal of Therapy:  Heparin level 0.3-0.7 units/ml Monitor platelets by anticoagulation protocol: Yes   Plan:  1. Start IV heparin at 700 units/hr. 2. Check heparin level 8 hrs after gtt starts. 3. Daily heparin level and CBC. 4. F/u plans to

## 2016-11-19 NOTE — Progress Notes (Signed)
eLink Physician-Brief Progress Note Patient Name: Cristina Parker DOB: 1936-01-23 MRN: [id number]   Date of Service  11/19/2016  HPI/Events of Note  Low ouptu cvp 17 Ct and pcxr prior with int changes   eICU Interventions  lasix     Intervention Category Intermediate Interventions: Oliguria - evaluation and management  Raylene Miyamoto. 11/19/2016, 2:29 AM

## 2016-11-19 NOTE — Progress Notes (Signed)
Pt remains hypotensive MAP 50's, HR 130-150s. Dr. Titus Mould notified. Orders received

## 2016-11-19 NOTE — Progress Notes (Signed)
Spoke to [place] RN(eLink) about HR 120-140's and high dose of neo with mod bp's current MAP 70's, also hypoglycemic episodes and uop of 60m since foley placement at 2100. Advised to check CVPs and she would relay message to Dr. FTitus Mould Awaiting orders.

## 2016-11-19 NOTE — Procedures (Signed)
ATRIAL LINE CATHETER INSERTION   Indication: Shock Consent: emergent Time out: yes Appropriate position: yes Hand washing: yes Patient Sterilized and Draped: yes Location: Left axilla # of Attempts: 2 Ultrasound Guidance: yes Wire Confirmed with [place]: yes Insertion depth: 12 cm  Line sutured in place: yes EBL: <5 cc Complications: no Patient Tolerated Procedure Well: yes   Meribeth Mattes, DO., MS [facility]

## 2016-11-19 NOTE — Progress Notes (Signed)
eLink Physician-Brief Progress Note Patient Name: Cristina Parker DOB: Apr 04, 1936 MRN: [id number]   Date of Service  11/19/2016  HPI/Events of Note  Drop in BP on neo 400 Fib rvr remains an issue No amio as LFT Add vaso May need levophed (trying to avoid with rvr as able)  eICU Interventions       Intervention Category Major Interventions: Hypotension - evaluation and management  FEINSTEIN,DANIEL J. 11/19/2016, 3:38 AM

## 2016-11-19 NOTE — Progress Notes (Signed)
eLink Physician-Brief Progress Note Patient Name: Cristina Parker DOB: 1936-03-04 MRN: [id number]   Date of Service  11/19/2016  HPI/Events of Note  Shock continue  eICU Interventions  Ad levo Dig IV x 1 dilt re start     Intervention Category Major Interventions: Arrhythmia - evaluation and management;Hypotension - evaluation and management  FEINSTEIN,DANIEL J. 11/19/2016, 4:22 AM

## 2016-11-20 ENCOUNTER — Inpatient Hospital Stay (HOSPITAL_COMMUNITY): Payer: Medicare Other

## 2016-11-20 DIAGNOSIS — A419 Sepsis, unspecified organism: Secondary | ICD-10-CM

## 2016-11-20 DIAGNOSIS — R6521 Severe sepsis with septic shock: Secondary | ICD-10-CM

## 2016-11-20 DIAGNOSIS — R740 Nonspecific elevation of levels of transaminase and lactic acid dehydrogenase [LDH]: Secondary | ICD-10-CM

## 2016-11-20 DIAGNOSIS — I5021 Acute systolic (congestive) heart failure: Secondary | ICD-10-CM

## 2016-11-20 DIAGNOSIS — R7401 Elevation of levels of liver transaminase levels: Secondary | ICD-10-CM

## 2016-11-20 DIAGNOSIS — J13 Pneumonia due to Streptococcus pneumoniae: Principal | ICD-10-CM

## 2016-11-20 LAB — CBC
HCT: 35.1 % — ABNORMAL LOW (ref 36.0–46.0)
Hemoglobin: 10.2 g/dL — ABNORMAL LOW (ref 12.0–15.0)
MCH: 28.3 pg (ref 26.0–34.0)
MCHC: 29.1 g/dL — AB (ref 30.0–36.0)
MCV: 97.5 fL (ref 78.0–100.0)
PLATELETS: 151 10*3/uL (ref 150–400)
RBC: 3.6 MIL/uL — ABNORMAL LOW (ref 3.87–5.11)
RDW: 16.1 % — AB (ref 11.5–15.5)
WBC: 22.3 10*3/uL — AB (ref 4.0–10.5)

## 2016-11-20 LAB — GLUCOSE, CAPILLARY
GLUCOSE-CAPILLARY: 127 mg/dL — AB (ref 65–99)
GLUCOSE-CAPILLARY: 188 mg/dL — AB (ref 65–99)
GLUCOSE-CAPILLARY: 147 mg/dL — AB (ref 65–99)
Glucose-Capillary: 102 mg/dL — ABNORMAL HIGH (ref 65–99)
Glucose-Capillary: 89 mg/dL (ref 65–99)
Glucose-Capillary: 105 mg/dL — ABNORMAL HIGH (ref 65–99)

## 2016-11-20 LAB — COMPREHENSIVE METABOLIC PANEL
ALBUMIN: 2.6 g/dL — AB (ref 3.5–5.0)
ALT: 2902 U/L — ABNORMAL HIGH (ref 14–54)
ANION GAP: 13 (ref 5–15)
AST: 4360 U/L — ABNORMAL HIGH (ref 15–41)
Alkaline Phosphatase: 185 U/L — ABNORMAL HIGH (ref 38–126)
BILIRUBIN TOTAL: 4 mg/dL — AB (ref 0.3–1.2)
BUN: 37 mg/dL — ABNORMAL HIGH (ref 6–20)
CALCIUM: 7.1 mg/dL — AB (ref 8.9–10.3)
CO2: 23 mmol/L (ref 22–32)
Chloride: 102 mmol/L (ref 101–111)
Creatinine, Ser: 1.62 mg/dL — ABNORMAL HIGH (ref 0.44–1.00)
GFR calc non Af Amer: 29 mL/min — ABNORMAL LOW (ref >60)
GFR, EST AFRICAN AMERICAN: 34 mL/min — AB (ref >60)
GLUCOSE: 123 mg/dL — AB (ref 65–99)
Potassium: 4 mmol/L (ref 3.5–5.1)
Sodium: 138 mmol/L (ref 135–145)
TOTAL PROTEIN: 4.7 g/dL — AB (ref 6.5–8.1)

## 2016-11-20 LAB — APTT
APTT: 153 s — AB (ref 24–36)
aPTT: 147 seconds — ABNORMAL HIGH (ref 24–36)

## 2016-11-20 LAB — HEPATITIS PANEL, ACUTE
HCV Ab: <0.1 s/co ratio (ref 0.0–0.9)
HEP A IGM: NEGATIVE
HEP B C IGM: NEGATIVE
Hepatitis B Surface Ag: NEGATIVE

## 2016-11-20 LAB — COOXEMETRY PANEL
CARBOXYHEMOGLOBIN: 1.4 % (ref 0.5–1.5)
METHEMOGLOBIN: 0.7 % (ref 0.0–1.5)
O2 SAT: 62.1 %
TOTAL HEMOGLOBIN: 10.3 g/dL — AB (ref 12.0–16.0)

## 2016-11-20 LAB — HEPARIN LEVEL (UNFRACTIONATED)
Heparin Unfractionated: 0.37 IU/mL (ref 0.30–0.70)
Heparin Unfractionated: 0.42 IU/mL (ref 0.30–0.70)

## 2016-11-20 LAB — PHOSPHORUS: PHOSPHORUS: 4.2 mg/dL (ref 2.5–4.6)

## 2016-11-20 LAB — MAGNESIUM: MAGNESIUM: 1.6 mg/dL — AB (ref 1.7–2.4)

## 2016-11-20 LAB — PROCALCITONIN: PROCALCITONIN: 1.47 ng/mL

## 2016-11-20 MED ORDER — DIGOXIN 0.25 MG/ML IJ SOLN: 0.2500 mg | Freq: Once | INTRAMUSCULAR | Status: DC

## 2016-11-20 MED ORDER — IPRATROPIUM-ALBUTEROL 0.5-2.5 (3) MG/3ML IN SOLN: 3.0000 mL | RESPIRATORY_TRACT | Status: DC | PRN

## 2016-11-20 MED ORDER — MAGNESIUM SULFATE 4 GM/100ML IV SOLN
4.0000 g | Freq: Once | INTRAVENOUS | Status: AC
  Administered 2016-11-20: 4 g via INTRAVENOUS
  Filled 2016-11-20: qty 100

## 2016-11-20 MED ORDER — VANCOMYCIN HCL IN DEXTROSE 750-5 MG/150ML-% IV SOLN
750.0000 mg | INTRAVENOUS | Status: DC
  Administered 2016-11-20 – 2016-11-21 (×2): 750 mg via INTRAVENOUS
  Filled 2016-11-20 (×3): qty 150

## 2016-11-20 MED ORDER — ORAL CARE MOUTH RINSE
15.0000 mL | OROMUCOSAL | Status: DC
  Administered 2016-11-20 – 2016-11-23 (×34): 15 mL via OROMUCOSAL

## 2016-11-20 MED ORDER — PANTOPRAZOLE SODIUM 40 MG PO PACK
40.0000 mg | PACK | ORAL | Status: DC
  Administered 2016-11-20 – 2016-11-23 (×4): 40 mg
  Filled 2016-11-20 (×4): qty 20

## 2016-11-20 MED ORDER — CHLORHEXIDINE GLUCONATE 0.12% ORAL RINSE (MEDLINE KIT)
15.0000 mL | Freq: Two times a day (BID) | OROMUCOSAL | Status: DC
  Administered 2016-11-20 – 2016-11-23 (×7): 15 mL via OROMUCOSAL

## 2016-11-20 MED ORDER — MAGNESIUM SULFATE 2 GM/50ML IV SOLN: 2.0000 g | Freq: Once | INTRAVENOUS | Status: DC

## 2016-11-20 MED ORDER — DIGOXIN 0.25 MG/ML IJ SOLN: 0.1250 mg | Freq: Every day | INTRAMUSCULAR | Status: DC

## 2016-11-20 MED ORDER — VITAL HIGH PROTEIN PO LIQD
1000.0000 mL | ORAL | Status: DC
  Administered 2016-11-20: 1000 mL
  Administered 2016-11-20 (×3)

## 2016-11-20 MED ORDER — HEPARIN (PORCINE) IN NACL 100-0.45 UNIT/ML-% IJ SOLN
700.0000 [IU]/h | INTRAMUSCULAR | Status: DC
  Administered 2016-11-20: 700 [IU]/h via INTRAVENOUS
  Filled 2016-11-20: qty 250

## 2016-11-20 MED ORDER — SODIUM CHLORIDE 0.9 % IV BOLUS (SEPSIS)
500.0000 mL | Freq: Once | INTRAVENOUS | Status: AC
  Administered 2016-11-20: 500 mL via INTRAVENOUS

## 2016-11-20 MED ORDER — DIGOXIN 0.25 MG/ML IJ SOLN: 0.1250 mg | Freq: Once | INTRAMUSCULAR | Status: DC

## 2016-11-20 MED ORDER — AMIODARONE HCL IN DEXTROSE 360-4.14 MG/200ML-% IV SOLN
30.0000 mg/h | INTRAVENOUS | Status: DC
  Administered 2016-11-20 – 2016-11-24 (×8): 30 mg/h via INTRAVENOUS
  Filled 2016-11-20 (×10): qty 200

## 2016-11-20 NOTE — Progress Notes (Signed)
Urine culture with Staph aureus.  Sputum culture still pending.  Procalcitonin increased.  Will add vancomycin pending sputum cultures.  Chesley Mires, MD [facility] Pulmonary/Critical Care 11/20/2016, 11:22 AM Pager:  [phone] After 3pm call: [phone]

## 2016-11-20 NOTE — Progress Notes (Signed)
Pharmacist Heart Failure Core Measure Documentation  Assessment: Cristina Parker has an EF documented as 25-30% on 12/13/2016 by ECHO.  Rationale: Heart failure patients with left ventricular systolic dysfunction (LVSD) and an EF < 40% should be prescribed an angiotensin converting enzyme inhibitor (ACEI) or angiotensin receptor blocker (ARB) at discharge unless a contraindication is documented in the medical record.  This patient is not currently on an ACEI or ARB for HF.  This note is being placed in the record in order to provide documentation that a contraindication to the use of these agents is present for this encounter.  ACE Inhibitor or Angiotensin Receptor Blocker is contraindicated (specify all that apply)  _0   ACEI allergy AND ARB allergy _1   Angioedema _2   Moderate or severe aortic stenosis _3   Hyperkalemia _4   Hypotension _5   Renal artery stenosis _6   Worsening renal function, preexisting renal disease or dysfunction   Uvaldo Rising, BCPS  Clinical Pharmacist Pager [phone]  11/20/2016 3:25 PM

## 2016-11-20 NOTE — Care Management Note (Signed)
Case Management Note Marvetta Gibbons RN, BSN Unit 2W-Case Manager-- [place] coverage [phone]  Patient Details  Name: Cristina Parker MRN: [id number] Date of Birth: 18-Apr-1936  Subjective/Objective:  Pt admitted with Acute on chronic hypoxic respiratory failure Cardiogenic shock, Afib with RVR   -- intubated- on Vent as of 6/5              Action/Plan: PTA pt lived at home with son and DIL-  CM will follow   Expected Discharge Date:                  Expected Discharge Plan:     In-House Referral:     Discharge planning Services  CM Consult  Post Acute Care Choice:    Choice offered to:     DME Arranged:    DME Agency:     HH Arranged:    [place] Agency:     Status of Service:  In process, will continue to follow  If discussed at Long Length of Stay Meetings, dates discussed:    Discharge Disposition:   Additional Comments:  Dawayne Patricia, RN 11/20/2016, 11:44 AM

## 2016-11-20 NOTE — Progress Notes (Signed)
Pharmacy Antibiotic Note  Cristina Parker is a 81 y.o. female admitted on 12/05/2016 with staph aureus in urine.  Pharmacy has been consulted for vancomycin dosing.  Remains on ceftriaxone per MD dosing for PNA.  Afebrile.    Plan: Vancomycin 750 mg IV every 24 hours.  Goal trough 15-20 mcg/mL.  Monitor renal function, cultures, and clinical course. Vancomycin trough at steady state as indicated.  Height: _0  (149.9 cm) Weight: 132 lb 8 oz (60.1 kg) IBW/kg (Calculated) : 43.2  Temp (24hrs), Avg:97.8 F (36.6 C), Min:97 F (36.1 C), Max:98.8 F (37.1 C)   Recent Labs Lab 11/17/2016 0755 11/28/2016 1441 12/01/2016 1954 11/19/16 0452 11/19/16 2221 11/20/16 0417  WBC 11.3*  --   --  25.0*  --  22.3*  CREATININE 1.07* 1.23* 1.25* 1.58* 1.62* 1.62*    Estimated Creatinine Clearance: 22.2 mL/min (A) (by C-G formula based on SCr of 1.62 mg/dL (H)).    No Known Allergies  Antimicrobials this admission:  Ceftriaxone 6/4 >>  Vancomycin 6/5 >>   Dose adjustments this admission:    Microbiology results:  6/3 BCx x 2: NGTD 6/3 UCx: > 100K colonies staph aureus  6/4 Sputum: reincubated  6/3 MRSA PCR: neg  Thank you for allowing pharmacy to be a part of this patient's care.  Pat Patrick 11/20/2016 11:41 AM

## 2016-11-20 NOTE — Progress Notes (Signed)
[place] for IV heparin Indication: atrial fibrillation  No Known Allergies  Patient Measurements: Height: _0  (149.9 cm) Weight: 132 lb 8 oz (60.1 kg) IBW/kg (Calculated) : 43.2 Heparin Dosing Weight: 58 kg  Vital Signs: Temp: 98.2 F (36.8 C) (06/05 1635) Temp Source: Oral (06/05 1635) BP: 111/44 (06/05 1600) Pulse Rate: 126 (06/05 1300)  Labs:  Recent Labs  12/11/2016 0755 12/06/2016 1441 11/16/2016 1954 11/19/16 0452  11/19/16 2041 11/19/16 2221 11/20/16 0417 11/20/16 0951 11/20/16 1529  HGB 10.5*  --   --  10.7*  --   --   --  10.2*  --   --   HCT 36.1  --   --  37.2  --   --   --  35.1*  --   --   PLT 177  --   --  136*  --   --   --  151  --   --   APTT  --   --   --  40*  < > 77*  --   --  153* 147*  LABPROT 22.3*  --   --  52.4*  --   --   --   --   --   --   INR 1.93  --   --  5.60*  --   --   --   --   --   --   HEPARINUNFRC  --   --   --   --   < > 0.47  --  0.37  --  0.42  CREATININE 1.07* 1.23* 1.25* 1.58*  --   --  1.62* 1.62*  --   --   TROPONINI  --  0.90* 1.53*  --   --   --   --   --   --   --   < > = values in this interval not displayed.  Estimated Creatinine Clearance: 22.2 mL/min (A) (by C-G formula based on SCr of 1.62 mg/dL (H)).  Assessment: 81 yo female with hx afib, not on chronic anticoagulation.  CHADSVASC at least 5.  Eliquis given 6/3 (1 dose).  Pharmacist requested to hold Eliquis given drastically elevated LFTs.  Started anticoagulation with IV heparin yesterday.  Baseline heparin level falsely elevated (reflection of recent Eliquis dose).   PTT this morning is above goal at 153 - spoke to RN - drawn from central line where heparin is running.  Likely inaccurate.   Rechecked HL/Aptt this afternoon, heparin level still within range and aptt still elevated in 140s? Would expect heparin level to be higher if aptt accurate. No issues with line per nursing will continue at current rate and follow  heparin levels only.  Goal of Therapy:  Heparin level 0.3-0.7 units/ml  PTT 66-102 Monitor platelets by anticoagulation protocol: Yes   Plan:  1. Continue heparin at 700 units/hr 2. Recheck HL with am labs  Erin Hearing PharmD., BCPS Clinical Pharmacist Pager [phone] 11/20/2016 4:44 PM

## 2016-11-20 NOTE — Progress Notes (Signed)
Advanced Heart Failure Rounding Note  PCP: Marijean Heath, MD Primary Cardiologist: Dr Johnsie Cancel  Subjective:    Cristina Parker is a 81 y.o. female with h/o Afib, PAH on macitentan, HTN, CABG 2005 with innominate bypass for severe arch disease, and PVD.  Presented to ED with worsening SOB and edema. Intubated for airway protection after episodes of suspected seizures. Found to be hypoglycemic in 68s.    Echo 11/21/2016 LVEF 25-30%, Moderate MR, Mild LAE, Mild/Mod RV, Mild RAE, Mod TI, PA peak pressure 56 mm Hg.   EF down from 50-55% in 11/03/2015.  Started on diltiazem with Afib RVR.  Amio initially avoided with transaminitis but now on, despite LFTs into 4000 range.  Troponin peak 1.53. Creatinine relative stable at 1.6 despite hypotension.   Currently on pressor support with Norepi 15 mcg, Phenylephrine 350, and vasopressin.   Remains intubated. Stirs to voice but does not follow commands.   CVP 8-9. So far she is positive 5.6 L this admission and up 9 lbs.   CXR this am with moderate layering bilateral pleural effusions with bilateral airspace disease, likely CHF.   Previous RHC with PAH as below.    RHC 11/2015 RA mean 10 RV 70/14 PA 66/19, mean 41 PCWP mean 16 Oxygen saturations: PA 57% AO 95% Cardiac Output (Fick) 3.3  Cardiac Index (Fick) 2.2 PVR 7.6 WU Cardiac Output (thermo) 4.16 Cardiac Index (thermo) 2.77  PVR 6 WU  Objective:   Weight Range: 132 lb 8 oz (60.1 kg) Body mass index is 26.76 kg/m.   Vital Signs:   Temp:  [97 F (36.1 C)-98.8 F (37.1 C)] 97.4 F (36.3 C) (06/05 0804) Pulse Rate:  [92-144] 121 (06/05 0700) Resp:  [10-28] 25 (06/05 0700) BP: (77-103)/(53-87) 77/69 (06/05 0700) SpO2:  [88 %-100 %] 96 % (06/05 0725) Arterial Line BP: (78-99)/(56-75) 81/57 (06/05 0700) FiO2 (%):  [40 %] 40 % (06/05 0725) Weight:  [132 lb 8 oz (60.1 kg)] 132 lb 8 oz (60.1 kg) (06/05 0500) Last BM Date:  (PTA )  Weight change: Filed Weights   11/17/2016 1300  11/19/16 0500 11/20/16 0500  Weight: 123 lb (55.8 kg) 128 lb 4.8 oz (58.2 kg) 132 lb 8 oz (60.1 kg)    Intake/Output:   Intake/Output Summary (Last 24 hours) at 11/20/16 0815 Last data filed at 11/20/16 0700  Gross per 24 hour  Intake          5482.58 ml  Output             1485 ml  Net          3997.58 ml     Physical Exam: General:  Chronically ill appearing. Intubated and sedated. Stirs to voice but does not follow commands.  HEENT: +ETT Neck: supple. JVP ~8-9. Carotids 2+ bilat; no bruits. No lymphadenopathy or thyromegaly appreciated. Cor: PMI nondisplaced. Irregularly irregular. No M/G/R noted.  Lungs: Basilar crackles.  Abdomen: Soft, nontender, nondistended. No hepatosplenomegaly. No bruits or masses. + BS Extremities: no cyanosis, clubbing, or rash. 1-2 + edema into thighs.  Neuro: Intubated but follows commands.   Telemetry: Personally reviewed, Afib 120s with occasional NSVT  Labs: CBC  Recent Labs  11/19/16 0452 11/20/16 0417  WBC 25.0* 22.3*  HGB 10.7* 10.2*  HCT 37.2 35.1*  MCV 99.5 97.5  PLT 136* 297   Basic Metabolic Panel  Recent Labs  11/19/16 0452 11/19/16 2221 11/20/16 0417  NA 139 139 138  K 4.3 4.0 4.0  CL 103 102 102  CO2 21* 22 23  GLUCOSE 76 109* 123*  BUN 38* 40* 37*  CREATININE 1.58* 1.62* 1.62*  CALCIUM 8.1* 7.3* 7.1*  MG 2.0 1.7 1.6*  PHOS 5.9*  --  4.2   Liver Function Tests  Recent Labs  11/19/16 0452 11/20/16 0417  AST 4,890* 4,360*  ALT 2,759* 2,902*  ALKPHOS 154* 185*  BILITOT 3.4* 4.0*  PROT 5.4* 4.7*  ALBUMIN 3.0* 2.6*    Recent Labs  12/01/2016 0755  LIPASE 31   Cardiac Enzymes  Recent Labs  11/23/2016 1441 11/23/2016 1954  TROPONINI 0.90* 1.53*    BNP: BNP (last 3 results)  Recent Labs  06/19/16 0536 12/07/2016 0758  BNP 742.3* 1,106.7*    ProBNP (last 3 results) No results for input(s): PROBNP in the last 8760 hours.   D-Dimer No results for input(s): DDIMER in the last 72  hours. Hemoglobin A1C No results for input(s): HGBA1C in the last 72 hours. Fasting Lipid Panel No results for input(s): CHOL, HDL, LDLCALC, TRIG, CHOLHDL, LDLDIRECT in the last 72 hours. Thyroid Function Tests No results for input(s): TSH, T4TOTAL, T3FREE, THYROIDAB in the last 72 hours.  Invalid input(s): FREET3  Other results:  Imaging/Studies:  Dg Chest Port 1 View  Result Date: 11/20/2016 CLINICAL DATA:  ET tube, respiratory failure EXAM: PORTABLE CHEST 1 VIEW COMPARISON:  11/19/2016 FINDINGS: Endotracheal tube and left central line as well as NG tube remain in place, unchanged. Moderate bilateral layering effusions. Diffuse bilateral airspace disease, likely edema. Mild cardiomegaly. IMPRESSION: Moderate layering bilateral pleural effusions with bilateral airspace disease, likely CHF. No real change. Electronically Signed   By: Rolm Baptise M.D.   On: 11/20/2016 07:35     Medications:    Scheduled Medications: . chlorhexidine gluconate (MEDLINE KIT)  15 mL Mouth Rinse BID  . Chlorhexidine Gluconate Cloth  6 each Topical Daily  . digoxin  0.125 mg Intravenous Once  . [START ON 11/21/2016] digoxin  0.125 mg Intravenous Daily  . digoxin  0.25 mg Intravenous Once  . feeding supplement (VITAL HIGH PROTEIN)  1,000 mL Per Tube Q24H  . mouth rinse  15 mL Mouth Rinse 10 times per day  . pantoprazole sodium  40 mg Per Tube Q24H  . sodium chloride flush  10-40 mL Intracatheter Q12H  . sodium chloride flush  3 mL Intravenous Q12H     Infusions: . sodium chloride 1,000 mL (11/20/16 0531)  . cefTRIAXone (ROCEPHIN)  IV Stopped (11/19/16 1625)  . heparin 700 Units/hr (11/19/16 1239)  . magnesium sulfate 1 - 4 g bolus IVPB    . norepinephrine (LEVOPHED) Adult infusion 15 mcg/min (11/20/16 0755)  . phenylephrine (NEO-SYNEPHRINE) Adult infusion 350 mcg/min (11/20/16 0803)  . sodium chloride    . vasopressin (PITRESSIN) infusion - *FOR SHOCK* 0.03 Units/min (11/20/16 0150)     PRN  Medications:  fentaNYL (SUBLIMAZE) injection, ipratropium-albuterol, midazolam, ondansetron (ZOFRAN) IV, sennosides, sodium chloride flush   Assessment/Plan   [name] is a 81 y.o. female with h/o Afib, PAH on macitentan, HTN, CAD s/p CABG and PVD.  Presented to ED with worsening SOB and edema. Intubated for airway protection after episodes of suspected seizures. Found to be hypoglycemic in 33s.   1. Acute systolic CHF - EF down to 25-30% from 50-55%.  - ? Related to poorly controlled Afib vs worsening CAD, though trop peak 1.5. - Remains on norepi 15, phenylephrine 350, and vasopressin 0.03. - CVP 8-9. Coox 70% yesterday on pressor  support.    - Currently on NS 30 ml/hr, Volume overloaded on exam. Would like to gently diurese but not sure pressures tolerate.  - Place ted hose.  2. Afib with RVR - Rates were poorly controlled on diltiazem, now on amiodarone.  - Continue amio for now.  - This patients CHA2DS2-VASc Score and unadjusted Ischemic Stroke Rate (% per year) is at least 9.7 % stroke rate/year from a score of 6 (CHF, DM, Vascular disease, female, > 75) - Supp electrolytes.   - May need TEE/DCCV but currently unstable.  - Will need anticoag if improves. Currently on heparin.  - Stop digoxin.  - Would wean phenylephrine and titrate norepi as needed.  3. Septic Shock due to PNA - Day 2 rocephin.   - On pressor support.  - WBC 22.3 this am.  4. Acute on chronic respiratory failure  - Remains intubated. Per CCM.  - On home 02.  History of ILD (NSIP) 5. CAD with distant CABG with LIMA to LAD - With drop in EF, suspect will need R/LHC this admission.  ? Low output with marked transaminitis and low BP.  - Holding statin with LFTs up.  6. AKI - Creatinine 1.2 -> 1.6. Likely with hypotension. Continue to follow closely.  7. ? Seizure like activity - Per primary. No further. 8. PAH - Macitentan recently stopped for edema.  9. Transaminitis - RUQ [place] with GB wall  thickening and ? Acalculous cholecystitis. Heterogeneous liver. Normal CBD.  10. Hypomagnesemia - Mg 1.6 this am. Will give 4 g.   Patient is critically ill and remains in imminent danger of multiple organ failure.    Length of Stay: 2  Annamaria Helling  11/20/2016, 8:15 AM  Advanced Heart Failure Team Pager [phone] (M-F; 7a - 4p)  Please contact [facility] for night-coverage after hours (4p -7a ) and weekends on [email or web address]  81 yo with extensive history of PAD, CAD s/p CABG, interstitial lung disease, pulmonary hypertension is seen for shock and acute hypoxemic respiratory failure.  1. Shock: Suspect mixed cardiogenic and septic shock.  Echo this admission showed EF 25-30%, dyskinetic anteroseptal wall and apex, mild to moderately decreased RV systolic function, PASP 56 mmHg.  Prior echo in 5/17 with EF 50-55%.  She has Staph aureus in urine, suspect Staph bacteremia, ?PNA source.  Currently requiring norepinephrine, phenylephrine, and vasopressin.  Co-ox yesterday was 70%.  CVP 8-9. PCT 1.47 (higher).  WBCs elevated.  - Broad spectrum abx per CCM.  - Continue vasopressin.  Titrate down phenylephrine as able, would preferentially use norepinephrine (does not appear to be altering HR when norepinephrine is increased).   - Send co-ox today.  - Would not aggressively diurese, CVP not significantly elevated.  - Given history of subclavian stenosis, would take BP by cuff on thigh to see if this matches the arterial line in the left arm (thigh cuff may well be more accurate and we may be able to wean pressors).  2. Acute systolic CHF: New fall in EF noted on echo this admission, 25-30% compared to 50-55% in 5/17.  Anteroseptal wall is dyskinetic, fall in EF may not be acute.  I am concerned that Mrs Buehner has compromised flow to her LIMA (left subclavian appeared severely stenosed to occluded on prior doppler evaluation).   - Support BP as above.  - Follow co-ox, send now.  - CVP  8-9, hold off on aggressive diuresis as above.  3. Atrial fibrillation with RVR: New.  HR in 110s currently, she is on amiodarone gtt.   - Continue heparin gtt. - With low EF and shock, would avoid diltiazem gtt.  - Amiodarone use concerning with high LFTs, but LFTs are likely high due to shock liver and I suspect amiodarone is not going to worsen this. Would continue for now for rate control.  4. Elevated LFTs: Hepatitis viral labs negative.  Abdominal US unremarkable.  Suspect shock liver in setting of hypotension. Follow LFTs daily, hopefully will start to come down soon.  As above, would continue amiodarone for now.  5. AKI: Creatinine up to 1.6 but has been fairly stable.  6. ID: Concern for PNA with septic shock.  Has Staph aureus in urine so may be triggering organism. WBCs and PCT high.  Broad spectrum abx per CCM.  7. Pulmonary hypertension: She was on Opsumit in the past, stopped in 5/18 with concern for increased swelling.   8. Interstitial lung disease: Sees Dr. Chase Caller.  Possible NSIP.  9. Subclavian stenosis: Bilateral, left subclavian stenosis severe on 2016 doppler evaluation.  10. CAD: s/p CABG.  Holding statin with increased LFTs. Fall in EF between 5/17 and 6/18 is concerning.  With anteroseptal dyskinesis, I am concerned that she may have lost her LIMA => at risk with history of severe left subclavian stenosis.  I am not sure how acute the fall in EF actually is.  Will need to consider further workup once she is stabilized.   Loralie Champagne 11/20/2016 12:18 PM

## 2016-11-20 NOTE — Progress Notes (Signed)
PULMONARY / CRITICAL CARE MEDICINE   Name: Cristina Parker MRN: [id number] DOB: October 12, 1935    ADMISSION DATE:  12/12/2016 CONSULTATION DATE:  12/10/2016  REFERRING MD:  Dr. Posey Pronto   CHIEF COMPLAINT:  Dyspnea   HPI: 81 yo female presented with dyspnea, edema, wt gain, hypoxia, wheezing.  She was in A fib with RVR.  She required intubation.  She has PMHx of COPD with emphysema, CAD s/p CABG, A fib, diastolic CHF, and pulmonary hypertension on macitentan as outpt.  SUBJECTIVE:  Remains on pressors.  VITAL SIGNS: BP (!) 77/69   Pulse (!) 121   Temp 97.4 F (36.3 C) (Axillary)   Resp (!) 25   Ht _0  (1.499 m)   Wt 132 lb 8 oz (60.1 kg)   SpO2 96%   BMI 26.76 kg/m   HEMODYNAMICS: CVP:  [9 mmHg-36 mmHg] 10 mmHg  VENTILATOR SETTINGS: Vent Mode: PSV;CPAP FiO2 (%):  [40 %] 40 % Set Rate:  [24 bmp] 24 bmp Vt Set:  [350 mL] 350 mL PEEP:  [5 cmH20] 5 cmH20 Pressure Support:  [5 cmH20-10 cmH20] 10 cmH20 Plateau Pressure:  [17 cmH20-20 cmH20] 20 cmH20  INTAKE / OUTPUT: I/O last 3 completed shifts: In: 6460.3 [I.V.:6350.3; NG/GT:60; IV Piggyback:50] Out: 3810 [Urine:1625; Emesis/NG output:50]  PHYSICAL EXAMINATION:  General - ill appearing Eyes - pupils reactive ENT - ETT in place Cardiac - irregular, no murmur Chest - basilar crackles Abd - soft, non tender Ext - no edema Skin - no rashes Neuro - normal strength Psych - normal mood   LABS:  BMET  Recent Labs Lab 11/19/16 0452 11/19/16 2221 11/20/16 0417  NA 139 139 138  K 4.3 4.0 4.0  CL 103 102 102  CO2 21* 22 23  BUN 38* 40* 37*  CREATININE 1.58* 1.62* 1.62*  GLUCOSE 76 109* 123*    Electrolytes  Recent Labs Lab 11/19/16 0452 11/19/16 2221 11/20/16 0417  CALCIUM 8.1* 7.3* 7.1*  MG 2.0 1.7 1.6*  PHOS 5.9*  --  4.2    CBC  Recent Labs Lab 11/17/2016 0755 11/19/16 0452 11/20/16 0417  WBC 11.3* 25.0* 22.3*  HGB 10.5* 10.7* 10.2*  HCT 36.1 37.2 35.1*  PLT 177 136* 151     Coag's  Recent Labs Lab 11/24/2016 0755 11/19/16 0452 11/19/16 1214 11/19/16 2041  APTT  --  40* 43* 77*  INR 1.93 5.60*  --   --     Sepsis Markers  Recent Labs Lab 12/04/2016 1538 11/19/16 0452 11/20/16 0417  PROCALCITON 0.12 0.65 1.47    ABG  Recent Labs Lab 11/19/16 0037 11/19/16 0352 11/19/16 1757  PHART 7.338* 7.338* 7.393  PCO2ART 44.2 41.8 41.2  PO2ART 169.0* 107 68.0*    Liver Enzymes  Recent Labs Lab 12/02/2016 0755 11/19/16 0452 11/20/16 0417  AST 1,238* 4,890* 4,360*  ALT 823* 2,759* 2,902*  ALKPHOS 150* 154* 185*  BILITOT 2.2* 3.4* 4.0*  ALBUMIN 3.4* 3.0* 2.6*    Cardiac Enzymes  Recent Labs Lab 12/06/2016 1441 12/15/2016 1954  TROPONINI 0.90* 1.53*    Glucose  Recent Labs Lab 11/19/16 0746 11/19/16 1145 11/19/16 1649 11/19/16 1941 11/19/16 2322 11/20/16 0319  GLUCAP 103* 83 83 79 99 102*    Imaging Dg Chest Port 1 View  Result Date: 11/20/2016 CLINICAL DATA:  ET tube, respiratory failure EXAM: PORTABLE CHEST 1 VIEW COMPARISON:  11/19/2016 FINDINGS: Endotracheal tube and left central line as well as NG tube remain in place, unchanged. Moderate bilateral layering effusions. Diffuse  bilateral airspace disease, likely edema. Mild cardiomegaly. IMPRESSION: Moderate layering bilateral pleural effusions with bilateral airspace disease, likely CHF. No real change. Electronically Signed   By: Rolm Baptise M.D.   On: 11/20/2016 07:35     STUDIES:  CT Head 6/3 > Chronic ski make atrophic changes without acute abnormality [place] ABD Limited RUQ 6/4 > Gallbladder wall thickening and pericholecystic fluid may reflect acalculous cholecystitis. Heterogeneous hepatic echotexture is nonspecific. Normal appearing common bile duct. ECHO 6/4 > EF 25-30, severely reduced systolic function, PA pressure 56  CULTURES: Urine 6/3 >> Blood 6/3 >> Pneumococcal Ag /603 >> positive Sputum 6/4 >>  ANTIBIOTICS: Rocephin 6/04 >>   SIGNIFICANT  EVENTS: 6/03 presents to ED   LINES/TUBES: ETT 6/3 >> CVC Left IJ 6/3 >> Left Axillary Aline 6/4 >>  DISCUSSION: 81 yo female with VDRF from Pneumococcal PNA, septic shock, acute combined CHF, A fib with RVR, COPD with emphysema.  She has reported hx of ILD/NSIP, but recent chest imaging and serology is not consistent with this.  She also has history of pulmonary hypertension.  ASSESSMENT / PLAN:  Acute on chronic hypoxic respiratory failure. Hx of COPD with emphysema. - continue vent support - f/u CXR - prn BDs  Pneumococcal pneumonia with septic shock. - day 2 rocephin - pressors to keep MAP > 65 - use phenylephrine with vasopressin in setting of tachycardia - would likely need to target higher CVP - goal 10 to 12  Acute combined CHF. Hx of CAD s/p CABG. A fib with RVR. - continue heparin gtt - add digoxin 6/05 - d/c lipitor, eliquis, amiodarone in setting of elevated LFTs  Pulmonary hypertension. 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 - might be reaching plateau  - f/u LFTs  Anemia of critical illness and chronic disease. - f/u CBC  Acute renal failure >> baseline creatinine 0.66 from 10/31/16. - f/u BMET  Acute metabolic encephalopathy. - RASS goal 0  DVT prophylaxis - heparin gtt SUP - protonix Nutrition - trickle tube feeds Goals of care - DNR  CC time 8 minutes  Chesley Mires, MD [place] 11/20/2016, 8:22 AM Pager:  [phone] After 3pm call: [phone]

## 2016-11-20 NOTE — Progress Notes (Signed)
Initial Nutrition Assessment  DOCUMENTATION CODES:   Not applicable  INTERVENTION:    As medically appropriate, increase Vital High Protein by 10 ml every 4-6 hours to goal rate of 50 ml/hr  TF regimen to provide 1200 kcals, 90 gm protein, 1003 ml of free water  NUTRITION DIAGNOSIS:   Inadequate oral intake related to inability to eat as evidenced by NPO status  GOAL:   Patient will meet greater than or equal to 90% of their needs  MONITOR:   TF tolerance, Vent status, Labs, Weight trends, Skin, I & O's  REASON FOR ASSESSMENT:   Consult Enteral/tube feeding initiation and management  ASSESSMENT:   81 y.o. Female with h/o Afib, PAH on macitentan, HTN, CAD and PVD.  Presented to ED with worsening SOB and edema. Intubated for airway protection after episodes of suspected seizures. Found to be hypoglycemic in 66s.    Patient is currently intubated on ventilator support MV: 7.8 L/min Temp (24hrs), Avg:97.7 F (36.5 C), Min:97 F (36.1 C), Max:98.8 F (37.1 C)  OGT in place  Pt with VDRF from pneumococcal PNA.  PMH of COPD.   Also with acute combined CHF.  In A fib with RVR.  Hx of CAD s/p CABG. Vital High Protein formula ordered this AM.  Currently infusing at 20 ml/hr. Medications reviewed.  Labs reviewed.  Magnesium 1.6 (L). CBG's [id number].  Unable to complete Nutrition-Focused physical exam at this time.  Pt confused with mittens on.  Diet Order:  Diet NPO time specified  Skin:  Reviewed, no issues  Last BM:  PTA  Height:   Ht Readings from Last 1 Encounters:  11/19/2016 _0  (1.499 m)   Weight:   Wt Readings from Last 1 Encounters:  11/20/16 132 lb 8 oz (60.1 kg)   Ideal Body Weight:  44.5 kg  BMI:  Body mass index is 26.76 kg/m.  Estimated Nutritional Needs:   Kcal:  1167  Protein:  90-110 gm  Fluid:  per MD  EDUCATION NEEDS:   No education needs identified at this time  Arthur Holms, RD, LDN Pager #: [phone] After-Hours  Pager #: [phone]

## 2016-11-20 NOTE — Progress Notes (Signed)
[place] for IV heparin Indication: atrial fibrillation  No Known Allergies  Patient Measurements: Height: _0  (149.9 cm) Weight: 132 lb 8 oz (60.1 kg) IBW/kg (Calculated) : 43.2 Heparin Dosing Weight: 58 kg  Vital Signs: Temp: 97.4 F (36.3 C) (06/05 0804) Temp Source: Axillary (06/05 0804) BP: 93/76 (06/05 1000) Pulse Rate: 129 (06/05 1000)  Labs:  Recent Labs  12/14/2016 0755 12/05/2016 1441 12/12/2016 1954  11/19/16 0452 11/19/16 1214 11/19/16 2041 11/19/16 2221 11/20/16 0417 11/20/16 0951  HGB 10.5*  --   --   --  10.7*  --   --   --  10.2*  --   HCT 36.1  --   --   --  37.2  --   --   --  35.1*  --   PLT 177  --   --   --  136*  --   --   --  151  --   APTT  --   --   --   < > 40* 43* 77*  --   --  153*  LABPROT 22.3*  --   --   --  52.4*  --   --   --   --   --   INR 1.93  --   --   --  5.60*  --   --   --   --   --   HEPARINUNFRC  --   --   --   --   --  0.49 0.47  --  0.37  --   CREATININE 1.07* 1.23* 1.25*  --  1.58*  --   --  1.62* 1.62*  --   TROPONINI  --  0.90* 1.53*  --   --   --   --   --   --   --   < > = values in this interval not displayed.  Estimated Creatinine Clearance: 22.2 mL/min (A) (by C-G formula based on SCr of 1.62 mg/dL (H)).   Medical History: Past Medical History:  Diagnosis Date  . Anemia   . Anxiety   . Arthritis   . Blood transfusion without reported diagnosis   . Cataract    BILATERAL  . Centrilobular emphysema ([place])   . Collagenous colitis   . Crohn's disease ([place])   . Depression   . Duodenal stricture   . Duodenal ulcer   . Esophageal reflux   . Heart disease   . Heart murmur   . Hiatal hernia   . Hyperlipemia   . Hypertension   . Osteoporosis   . Paroxysmal A-fib ([place]) 07/09/2011  . Peripheral vascular disease ([place])   . Pneumonia   . Pulmonary hypertension ([facility])     Medications:  Infusions:  . sodium chloride 250 mL (11/20/16 1000)  . amiodarone 30 mg/hr (11/20/16  1000)  . cefTRIAXone (ROCEPHIN)  IV Stopped (11/19/16 1625)  . heparin    . magnesium sulfate 1 - 4 g bolus IVPB 4 g (11/20/16 0942)  . norepinephrine (LEVOPHED) Adult infusion 10 mcg/min (11/20/16 1057)  . phenylephrine (NEO-SYNEPHRINE) Adult infusion 400 mcg/min (11/20/16 1000)  . vasopressin (PITRESSIN) infusion - *FOR SHOCK* 0.03 Units/min (11/20/16 1000)    Assessment: 81 yo female with hx afib, not on chronic anticoagulation.  CHADSVASC at least 5.  Eliquis given 6/3 (1 dose).  Pharmacist requested to hold Eliquis given drastically elevated LFTs.  Started anticoagulation with IV heparin yesterday.  Baseline heparin level falsely  elevated (reflection of recent Eliquis dose).   PTT this morning is above goal at 153 - spoke to RN - drawn from central line where heparin is running.  Likely inaccurate.  Goal of Therapy:  Heparin level 0.3-0.7 units/ml  PTT 66-102 Monitor platelets by anticoagulation protocol: Yes   Plan:  1. Recheck PTT and Heparin level now with peripheral draw.  Uvaldo Rising, BCPS  Clinical Pharmacist Pager [phone]  11/20/2016 11:06 AM

## 2016-11-21 ENCOUNTER — Inpatient Hospital Stay (HOSPITAL_COMMUNITY): Payer: Medicare Other

## 2016-11-21 LAB — BASIC METABOLIC PANEL
Anion gap: 9 (ref 5–15)
BUN: 35 mg/dL — ABNORMAL HIGH (ref 6–20)
CALCIUM: 6.7 mg/dL — AB (ref 8.9–10.3)
CO2: 30 mmol/L (ref 22–32)
CREATININE: 1.38 mg/dL — AB (ref 0.44–1.00)
Chloride: 99 mmol/L — ABNORMAL LOW (ref 101–111)
GFR calc Af Amer: 41 mL/min — ABNORMAL LOW (ref >60)
GFR, EST NON AFRICAN AMERICAN: 35 mL/min — AB (ref >60)
GLUCOSE: 206 mg/dL — AB (ref 65–99)
Potassium: 2.8 mmol/L — ABNORMAL LOW (ref 3.5–5.1)
Sodium: 138 mmol/L (ref 135–145)

## 2016-11-21 LAB — CULTURE, RESPIRATORY

## 2016-11-21 LAB — GLUCOSE, CAPILLARY
GLUCOSE-CAPILLARY: 173 mg/dL — AB (ref 65–99)
GLUCOSE-CAPILLARY: 155 mg/dL — AB (ref 65–99)
Glucose-Capillary: 144 mg/dL — ABNORMAL HIGH (ref 65–99)
Glucose-Capillary: 149 mg/dL — ABNORMAL HIGH (ref 65–99)
Glucose-Capillary: 153 mg/dL — ABNORMAL HIGH (ref 65–99)
Glucose-Capillary: 169 mg/dL — ABNORMAL HIGH (ref 65–99)

## 2016-11-21 LAB — COMPREHENSIVE METABOLIC PANEL
ALBUMIN: 2.4 g/dL — AB (ref 3.5–5.0)
ALT: 1961 U/L — ABNORMAL HIGH (ref 14–54)
AST: 2143 U/L — ABNORMAL HIGH (ref 15–41)
Alkaline Phosphatase: 193 U/L — ABNORMAL HIGH (ref 38–126)
Anion gap: 10 (ref 5–15)
BUN: 35 mg/dL — ABNORMAL HIGH (ref 6–20)
CO2: 26 mmol/L (ref 22–32)
Calcium: 6.5 mg/dL — ABNORMAL LOW (ref 8.9–10.3)
Chloride: 102 mmol/L (ref 101–111)
Creatinine, Ser: 1.37 mg/dL — ABNORMAL HIGH (ref 0.44–1.00)
GFR calc Af Amer: 41 mL/min — ABNORMAL LOW (ref >60)
GFR calc non Af Amer: 36 mL/min — ABNORMAL LOW (ref >60)
GLUCOSE: 158 mg/dL — AB (ref 65–99)
POTASSIUM: 2.9 mmol/L — AB (ref 3.5–5.1)
SODIUM: 138 mmol/L (ref 135–145)
Total Bilirubin: 5.4 mg/dL — ABNORMAL HIGH (ref 0.3–1.2)
Total Protein: 4.3 g/dL — ABNORMAL LOW (ref 6.5–8.1)

## 2016-11-21 LAB — CBC
HCT: 33.6 % — ABNORMAL LOW (ref 36.0–46.0)
Hemoglobin: 10 g/dL — ABNORMAL LOW (ref 12.0–15.0)
MCH: 28.3 pg (ref 26.0–34.0)
MCHC: 29.8 g/dL — ABNORMAL LOW (ref 30.0–36.0)
MCV: 95.2 fL (ref 78.0–100.0)
PLATELETS: 143 10*3/uL — AB (ref 150–400)
RBC: 3.53 MIL/uL — ABNORMAL LOW (ref 3.87–5.11)
RDW: 16.2 % — AB (ref 11.5–15.5)
WBC: 19.4 10*3/uL — AB (ref 4.0–10.5)

## 2016-11-21 LAB — COOXEMETRY PANEL
Carboxyhemoglobin: 0.9 % (ref 0.5–1.5)
METHEMOGLOBIN: 0.9 % (ref 0.0–1.5)
O2 Saturation: 57.6 %
Total hemoglobin: 9.9 g/dL — ABNORMAL LOW (ref 12.0–16.0)

## 2016-11-21 LAB — URINE CULTURE

## 2016-11-21 LAB — CULTURE, RESPIRATORY W GRAM STAIN: Culture: NORMAL

## 2016-11-21 LAB — BLOOD GAS, ARTERIAL
ACID-BASE EXCESS: 0.4 mmol/L (ref 0.0–2.0)
BICARBONATE: 24.9 mmol/L (ref 20.0–28.0)
Drawn by: 418751
FIO2: 50
LHR: 24 {breaths}/min
O2 Saturation: 94.6 %
PATIENT TEMPERATURE: 98.6
PEEP/CPAP: 5 cmH2O
PO2 ART: 80.8 mmHg — AB (ref 83.0–108.0)
VT: 350 mL
pCO2 arterial: 43.3 mmHg (ref 32.0–48.0)
pH, Arterial: 7.378 (ref 7.350–7.450)

## 2016-11-21 LAB — HEPARIN LEVEL (UNFRACTIONATED): HEPARIN UNFRACTIONATED: 0.29 [IU]/mL — AB (ref 0.30–0.70)

## 2016-11-21 LAB — MAGNESIUM
Magnesium: 1.9 mg/dL (ref 1.7–2.4)
Magnesium: 2.1 mg/dL (ref 1.7–2.4)

## 2016-11-21 MED ORDER — POTASSIUM CHLORIDE 20 MEQ/15ML (10%) PO SOLN
40.0000 meq | Freq: Once | ORAL | Status: AC
  Administered 2016-11-21: 40 meq via ORAL
  Filled 2016-11-21: qty 30

## 2016-11-21 MED ORDER — POTASSIUM CHLORIDE 10 MEQ/50ML IV SOLN
10.0000 meq | INTRAVENOUS | Status: AC
  Administered 2016-11-21 (×2): 10 meq via INTRAVENOUS
  Filled 2016-11-21 (×2): qty 50

## 2016-11-21 MED ORDER — NOREPINEPHRINE BITARTRATE 1 MG/ML IV SOLN: 0.0000 ug/min | INTRAVENOUS | Status: DC

## 2016-11-21 MED ORDER — VITAL HIGH PROTEIN PO LIQD
1000.0000 mL | ORAL | Status: DC
  Administered 2016-11-21 – 2016-11-22 (×2): 1000 mL

## 2016-11-21 MED ORDER — NOREPINEPHRINE BITARTRATE 1 MG/ML IV SOLN
0.0000 ug/min | INTRAVENOUS | Status: DC
  Administered 2016-11-21: 16 ug/min via INTRAVENOUS
  Administered 2016-11-22 – 2016-11-23 (×2): 17 ug/min via INTRAVENOUS
  Administered 2016-11-23: 19 ug/min via INTRAVENOUS
  Administered 2016-11-23: 20 ug/min via INTRAVENOUS
  Filled 2016-11-21 (×5): qty 16

## 2016-11-21 MED ORDER — HEPARIN (PORCINE) IN NACL 100-0.45 UNIT/ML-% IJ SOLN
750.0000 [IU]/h | INTRAMUSCULAR | Status: DC
  Administered 2016-11-22: 750 [IU]/h via INTRAVENOUS
  Filled 2016-11-21: qty 250

## 2016-11-21 MED ORDER — POTASSIUM CHLORIDE 10 MEQ/50ML IV SOLN
10.0000 meq | INTRAVENOUS | Status: AC
  Administered 2016-11-21 (×6): 10 meq via INTRAVENOUS
  Filled 2016-11-21 (×7): qty 50

## 2016-11-21 MED ORDER — INSULIN ASPART 100 UNIT/ML ~~LOC~~ SOLN
0.0000 [IU] | SUBCUTANEOUS | Status: DC
  Administered 2016-11-21: 2 [IU] via SUBCUTANEOUS
  Administered 2016-11-21 (×3): 3 [IU] via SUBCUTANEOUS
  Administered 2016-11-22: 2 [IU] via SUBCUTANEOUS
  Administered 2016-11-22: 3 [IU] via SUBCUTANEOUS
  Administered 2016-11-22 – 2016-11-23 (×5): 2 [IU] via SUBCUTANEOUS
  Administered 2016-11-23 (×2): 3 [IU] via SUBCUTANEOUS
  Administered 2016-11-23: 2 [IU] via SUBCUTANEOUS
  Administered 2016-11-25: 3 [IU] via SUBCUTANEOUS

## 2016-11-21 MED ORDER — FUROSEMIDE 10 MG/ML IJ SOLN
80.0000 mg | Freq: Once | INTRAMUSCULAR | Status: AC
  Administered 2016-11-21: 80 mg via INTRAVENOUS
  Filled 2016-11-21: qty 8

## 2016-11-21 MED ORDER — FUROSEMIDE 10 MG/ML IJ SOLN
INTRAMUSCULAR | Status: AC
  Filled 2016-11-21: qty 4

## 2016-11-21 MED ORDER — POTASSIUM CHLORIDE 10 MEQ/50ML IV SOLN: 10.0000 meq | INTRAVENOUS | Status: DC

## 2016-11-21 MED ORDER — MAGNESIUM SULFATE 2 GM/50ML IV SOLN
2.0000 g | Freq: Once | INTRAVENOUS | Status: AC
  Administered 2016-11-21: 2 g via INTRAVENOUS
  Filled 2016-11-21: qty 50

## 2016-11-21 NOTE — Progress Notes (Signed)
ANTICOAGULATION CONSULT NOTE - Follow Up Consult  Pharmacy Consult for IV heparin Indication: atrial fibrillation  No Known Allergies  Patient Measurements: Height: _0  (149.9 cm) Weight: 136 lb 6.4 oz (61.9 kg) IBW/kg (Calculated) : 43.2 Heparin Dosing Weight: 58 kg  Vital Signs: Temp: 97 F (36.1 C) (06/06 0744) Temp Source: Axillary (06/06 0744) BP: 124/103 (06/06 0800) Pulse Rate: 92 (06/06 0755)  Labs:  Recent Labs  12/12/2016 1441 11/24/2016 1954  11/19/16 0452  11/19/16 2041 11/19/16 2221 11/20/16 0417 11/20/16 0951 11/20/16 1529 11/21/16 0326  HGB  --   --   < > 10.7*  --   --   --  10.2*  --   --  10.0*  HCT  --   --   --  37.2  --   --   --  35.1*  --   --  33.6*  PLT  --   --   --  136*  --   --   --  151  --   --  143*  APTT  --   --   --  40*  < > 77*  --   --  153* 147*  --   LABPROT  --   --   --  52.4*  --   --   --   --   --   --   --   INR  --   --   --  5.60*  --   --   --   --   --   --   --   HEPARINUNFRC  --   --   --   --   < > 0.47  --  0.37  --  0.42 0.29*  CREATININE 1.23* 1.25*  --  1.58*  --   --  1.62* 1.62*  --   --  1.37*  TROPONINI 0.90* 1.53*  --   --   --   --   --   --   --   --   --   < > = values in this interval not displayed.  Estimated Creatinine Clearance: 26.7 mL/min (A) (by C-G formula based on SCr of 1.37 mg/dL (H)).   Medications: Heparin @ 700 units/hr  Assessment: 81 yo female with hx afib, not on chronic anticoagulation.  CHADSVASC at least 5.  Eliquis given 6/3 (1 dose).  Pharmacist requested to hold Eliquis given drastically elevated LFTs.  Started anticoagulation with IV heparin on 6/4.  Baseline heparin level falsely elevated (reflection of recent Eliquis dose).  Heparin level today is just slightly below goal at 0.29. CBC stable. LFTs improving. No bleeding.  Goal of Therapy:  Heparin level 0.3-0.7 units/ml  Monitor platelets by anticoagulation protocol: Yes   Plan:  1) Increase heparin to 750 units/hr 2)  Daily heparin level and CBC  Nena Jordan, PharmD, BCPS 11/21/2016 9:49 AM

## 2016-11-21 NOTE — Progress Notes (Signed)
Patient ID: Cristina Parker, female   DOB: February 05, 1936, 81 y.o.   MRN: [id number]   SUBJECTIVE: Patient extubated but awake on vent.  She is now off phenylephrine, she remains on norepinephrine 9 and vasopressin 0.03.  Co-ox 58% today.  CVP 17-18, weight up 4 lbs.  3L net positive I/Os.  MAP has been 70s.  HR improved now in 80s atrial fibrillation.  LFTs starting to trend down.   Leg cuff BP matched to axillary arterial line BP and seemed to match fairly well.   Scheduled Meds: . chlorhexidine gluconate (MEDLINE KIT)  15 mL Mouth Rinse BID  . Chlorhexidine Gluconate Cloth  6 each Topical Daily  . feeding supplement (VITAL HIGH PROTEIN)  1,000 mL Per Tube Q24H  . furosemide  80 mg Intravenous Once  . mouth rinse  15 mL Mouth Rinse 10 times per day  . pantoprazole sodium  40 mg Per Tube Q24H  . sodium chloride flush  10-40 mL Intracatheter Q12H  . sodium chloride flush  3 mL Intravenous Q12H   Continuous Infusions: . sodium chloride 250 mL (11/20/16 1800)  . amiodarone 30 mg/hr (11/21/16 0351)  . cefTRIAXone (ROCEPHIN)  IV 1 g (11/20/16 1653)  . heparin    . magnesium sulfate 1 - 4 g bolus IVPB    . norepinephrine (LEVOPHED) Adult infusion 9 mcg/min (11/21/16 0615)  . phenylephrine (NEO-SYNEPHRINE) Adult infusion Stopped (11/21/16 1583)  . vancomycin Stopped (11/20/16 1532)  . vasopressin (PITRESSIN) infusion - *FOR SHOCK* 0.03 Units/min (11/20/16 2230)   PRN Meds:.fentaNYL (SUBLIMAZE) injection, ipratropium-albuterol, midazolam, ondansetron (ZOFRAN) IV, sennosides, sodium chloride flush    Vitals:   11/21/16 0500 11/21/16 0600 11/21/16 0606 11/21/16 0744  BP: 98/74 (!) 81/65 91/77   Pulse: 80 88 94   Resp: (!) 22 (!) 24 (!) 24   Temp:    97 F (36.1 C)  TempSrc:    Axillary  SpO2: 97% 95% 95%   Weight:      Height:        Intake/Output Summary (Last 24 hours) at 11/21/16 0746 Last data filed at 11/21/16 0600  Gross per 24 hour  Intake           5171.4 ml  Output              2225 ml  Net           2946.4 ml    LABS: Basic Metabolic Panel:  Recent Labs  11/19/16 0452  11/20/16 0417 11/21/16 0326  NA 139  < > 138 138  K 4.3  < > 4.0 2.9*  CL 103  < > 102 102  CO2 21*  < > 23 26  GLUCOSE 76  < > 123* 158*  BUN 38*  < > 37* 35*  CREATININE 1.58*  < > 1.62* 1.37*  CALCIUM 8.1*  < > 7.1* 6.5*  MG 2.0  < > 1.6* 1.9  PHOS 5.9*  --  4.2  --   < > = values in this interval not displayed. Liver Function Tests:  Recent Labs  11/20/16 0417 11/21/16 0326  AST 4,360* 2,143*  ALT 2,902* 1,961*  ALKPHOS 185* 193*  BILITOT 4.0* 5.4*  PROT 4.7* 4.3*  ALBUMIN 2.6* 2.4*    Recent Labs  11/17/2016 0755  LIPASE 31   CBC:  Recent Labs  11/20/16 0417 11/21/16 0326  WBC 22.3* 19.4*  HGB 10.2* 10.0*  HCT 35.1* 33.6*  MCV 97.5 95.2  PLT 151 143*  Cardiac Enzymes:  Recent Labs  12/13/2016 1441 11/28/2016 1954  TROPONINI 0.90* 1.53*   BNP: Invalid input(s): POCBNP D-Dimer: No results for input(s): DDIMER in the last 72 hours. Hemoglobin A1C: No results for input(s): HGBA1C in the last 72 hours. Fasting Lipid Panel: No results for input(s): CHOL, HDL, LDLCALC, TRIG, CHOLHDL, LDLDIRECT in the last 72 hours. Thyroid Function Tests: No results for input(s): TSH, T4TOTAL, T3FREE, THYROIDAB in the last 72 hours.  Invalid input(s): FREET3 Anemia Panel: No results for input(s): VITAMINB12, FOLATE, FERRITIN, TIBC, IRON, RETICCTPCT in the last 72 hours.  RADIOLOGY: Ct Head Wo Contrast  Result Date: 12/13/2016 CLINICAL DATA:  Altered mental status EXAM: CT HEAD WITHOUT CONTRAST TECHNIQUE: Contiguous axial images were obtained from the base of the skull through the vertex without intravenous contrast. COMPARISON:  08/17/2013 FINDINGS: Brain: Diffuse atrophic changes are noted. Mild chronic white matter ischemic change is noted and stable. No findings to suggest acute hemorrhage, acute infarction or space-occupying mass lesion are noted. Mild basal  ganglia calcifications are seen bilaterally. Vascular: No hyperdense vessel or unexpected calcification. Skull: Normal. Negative for fracture or focal lesion. Sinuses/Orbits: No acute finding. Other: Nasogastric catheter is noted coiled within the posterior nasopharynx. IMPRESSION: Chronic ski make and atrophic changes without acute abnormality. Nasogastric catheter coiled within the posterior nasopharynx Electronically Signed   By: Inez Catalina M.D.   On: 12/03/2016 16:28   Dg Chest Port 1 View  Result Date: 11/20/2016 CLINICAL DATA:  ET tube, respiratory failure EXAM: PORTABLE CHEST 1 VIEW COMPARISON:  11/19/2016 FINDINGS: Endotracheal tube and left central line as well as NG tube remain in place, unchanged. Moderate bilateral layering effusions. Diffuse bilateral airspace disease, likely edema. Mild cardiomegaly. IMPRESSION: Moderate layering bilateral pleural effusions with bilateral airspace disease, likely CHF. No real change. Electronically Signed   By: Rolm Baptise M.D.   On: 11/20/2016 07:35   Dg Chest Port 1 View  Result Date: 11/19/2016 CLINICAL DATA:  Hypoxia EXAM: PORTABLE CHEST 1 VIEW COMPARISON:  November 18, 2016 FINDINGS: Endotracheal tube tip is 1.2 cm above the carina. Nasogastric tube tip and side port are below the diaphragm. Central catheter tip is in the superior vena cava. No pneumothorax. There are pleural effusions bilaterally with underlying interstitial edema. There is atelectatic change in the lung bases. Heart is mildly enlarged with pulmonary vascularity within normal limits. There is aortic atherosclerosis. No adenopathy evident. There is postoperative change in the lower cervical spine. IMPRESSION: Tube and catheter positions as described without pneumothorax. The endotracheal tube tip is near the carina; advise withdrawing endotracheal tube approximately 2 cm. Evidence a degree of underlying congestive heart failure. Bibasilar atelectasis. There is aortic atherosclerosis.  Electronically Signed   By: Lowella Grip III M.D.   On: 11/19/2016 07:06   Dg Chest Portable 1 View  Result Date: 12/10/2016 CLINICAL DATA:  Central line placement EXAM: PORTABLE CHEST 1 VIEW COMPARISON:  12/04/2016 FINDINGS: Left central line tip is in the upper right atrium. No pneumothorax. Cardiomegaly. Bilateral layering effusions and bilateral airspace disease, likely edema/ CHF. NG tube and endotracheal tube are unchanged. Left central line tip in the upper right atrium. No pneumothorax. IMPRESSION: Continued bilateral airspace disease and layering effusions, likely edema/CHF. Electronically Signed   By: Rolm Baptise M.D.   On: 11/17/2016 16:51   Dg Chest Port 1 View  Result Date: 11/22/2016 CLINICAL DATA:  Patient status post intubation. EXAM: PORTABLE CHEST 1 VIEW COMPARISON:  Chest radiograph 12/02/2016 FINDINGS: ET tube terminates in the  mid trachea. Enteric tube tip and side-port project over the stomach. Pacer apparatus overlies the left hemithorax. Patient status post median sternotomy. Low lung volumes. Persistent moderate right and small left pleural effusions with underlying pulmonary consolidation which appears worsened within the right greater than left mid and lower lungs. No pneumothorax. Osseous destruction right humeral head. IMPRESSION: ET tube terminates in the mid trachea. Worsening moderate right and small left pleural effusions with increasing bilateral underlying heterogeneous pulmonary opacities which may represent atelectasis or infection. Electronically Signed   By: Lovey Newcomer M.D.   On: 12/13/2016 14:17   Dg Chest Port 1 View  Result Date: 11/19/2016 CLINICAL DATA:  Shortness of breath for 1 day. EXAM: PORTABLE CHEST 1 VIEW COMPARISON:  06/20/2016 and prior exams FINDINGS: Cardiomegaly and CABG changes noted. A new moderate right pleural effusion noted with right lower lung atelectasis. A small left pleural effusion has enlarged with associated left basilar  atelectasis. There is no evidence of pneumothorax. No acute bony abnormality noted. Severe right shoulder degenerative changes and cervical spine surgical changes noted. IMPRESSION: New moderate right pleural effusion and right lower lung atelectasis. Slightly increased small left pleural effusion with left basilar atelectasis. Cardiomegaly and CABG changes. Electronically Signed   By: Margarette Canada M.D.   On: 12/07/2016 08:27   US Abdomen Limited Ruq  Result Date: 11/19/2016 CLINICAL DATA:  Transaminitis EXAM: US ABDOMEN LIMITED - RIGHT UPPER QUADRANT COMPARISON:  CT scan the abdomen pelvis of January 01, 2014 FINDINGS: The study was done portably. The patient is intubated. Defibrillation pads limit the acoustical window. Gallbladder: The gallbladder is adequately distended. There is gallbladder wall thickening to 11.6 mm. There is pericholecystic fluid. There is no positive sonographic Murphy's sign. No stones or sludge are evident. Common bile duct: Diameter: 3.8 mm Liver: The hepatic echotexture is heterogeneous. There is no focal mass nor ductal dilation. IMPRESSION: Gallbladder wall thickening and pericholecystic fluid may reflect acalculous cholecystitis. Heterogeneous hepatic echotexture is nonspecific. Normal appearing common bile duct. Electronically Signed   By: David  Martinique M.D.   On: 11/19/2016 07:05    PHYSICAL EXAM General: Intubated, awake.  Neck: JVP elevated, no thyromegaly or thyroid nodule.  Lungs: Decreased breath sounds at bases bilaterally. CV: Nondisplaced PMI.  Heart irregular S1/S2, no S3/S4, no murmur.  Trace ankle edema.   Abdomen: Soft, nontender, no hepatosplenomegaly, no distention.  Neurologic: Alert, follows commands.  Extremities: No clubbing or cyanosis.  Cool.   TELEMETRY: Personally reviewed telemetry pt in atrial fibrillation in 80s  ASSESSMENT AND PLAN: 81 yo with extensive history of PAD, CAD s/p CABG, interstitial lung disease, pulmonary hypertension is seen for  shock and acute hypoxemic respiratory failure.  1. Shock: Suspect mixed cardiogenic and septic shock.  Echo this admission showed EF 25-30%, dyskinetic anteroseptal wall and apex, mild to moderately decreased RV systolic function, PASP 56 mmHg.  Prior echo in 5/17 with EF 50-55%.  She has Staph aureus in urine, suspect Staph bacteremia, ?PNA source.  Currently requiring norepinephrine and vasopressin, able to titrate off phenylephrine yesterday.  Co-ox 58% this morning.  WBC trending down, afebrile. - Continue vancomycin/ceftriaxone pending sensitivities of S aureus from urine.  - Continue to titrate down pressors as able.     - Given history of subclavian stenosis, we checked BP by cuff on thigh and it actually matched the axillary arterial line measurement pretty well. 2. Acute systolic CHF: New fall in EF noted on echo this admission, 25-30% compared to 50-55% in 5/17.  Anteroseptal wall is dyskinetic, fall in EF may not be acute.  I am concerned that Mrs Dade has compromised flow to her LIMA (left subclavian appeared severely stenosed to occluded on prior doppler evaluation).  CVP now up to 17-18.  - Support BP as above.  - Follow co-ox, adequate for now.  - Lasix 80 mg IV x 1 and follow response.  3. Atrial fibrillation with RVR: New. HR down to 80s in atrial fibrillation.    - Continue heparin gtt. - With low EF and shock, would avoid diltiazem gtt.  - Amiodarone use concerning with high LFTs, but LFTs are likely high due to shock liver and I suspect amiodarone is not going to worsen this. Would continue for now for rate control.  4. Elevated LFTs: Hepatitis viral labs negative.  Abdominal US unremarkable.  Suspect shock liver in setting of hypotension. LFTs trending down.  As above, would continue amiodarone for now.  5. AKI: Creatinine down to 1.37.   6. ID: Concern for PNA with septic shock.  Has Staph aureus in urine so may be triggering organism. WBCs and PCT high. Broad spectrum abx per  CCM.  7. Pulmonary hypertension: She was on Opsumit in the past, stopped in 5/18 with concern for increased swelling.   8. Interstitial lung disease: Sees Dr. Chase Caller.  Possible NSIP.  9. Subclavian stenosis: Bilateral, left subclavian stenosis severe on 2016 doppler evaluation.  10. CAD: s/p CABG.  Holding statin with increased LFTs. Fall in EF between 5/17 and 6/18 is concerning.  With anteroseptal dyskinesis, I am concerned that she may have lost her LIMA => at risk with history of severe left subclavian stenosis.  I am not sure how acute the fall in EF actually is.  Will need to consider further workup once she is stabilized.   CRITICAL CARE Performed by: Loralie Champagne  Total critical care time: 35 minutes  Critical care time was exclusive of separately billable procedures and treating other patients.  Critical care was necessary to treat or prevent imminent or life-threatening deterioration.  Critical care was time spent personally by me on the following activities: development of treatment plan with patient and/or surrogate as well as nursing, discussions with consultants, evaluation of patient's response to treatment, examination of patient, obtaining history from patient or surrogate, ordering and performing treatments and interventions, ordering and review of laboratory studies, ordering and review of radiographic studies, pulse oximetry and re-evaluation of patient's condition.  Loralie Champagne 11/21/2016 8:09 AM

## 2016-11-21 NOTE — Progress Notes (Signed)
PULMONARY / CRITICAL CARE MEDICINE   Name: Cristina Parker MRN: [id number] DOB: 1936-01-22    ADMISSION DATE:  11/30/2016 CONSULTATION DATE:  11/16/2016  REFERRING MD:  Dr. Posey Pronto   CHIEF COMPLAINT:  Dyspnea   HPI: 81 yo female presented with dyspnea, edema, wt gain, hypoxia, wheezing.  She was in A fib with RVR.  She required intubation.  She has PMHx of COPD with emphysema, CAD s/p CABG, A fib, diastolic CHF, and pulmonary hypertension on macitentan as outpt.  SUBJECTIVE:  Tolerating pressure support better.  Still on pressors.  Aline not accurate.  VITAL SIGNS: BP (!) 80/59   Pulse 92   Temp 97 F (36.1 C) (Axillary)   Resp 18   Ht _0  (1.499 m)   Wt 136 lb 6.4 oz (61.9 kg)   SpO2 97%   BMI 27.55 kg/m   HEMODYNAMICS: CVP:  [9 mmHg] 9 mmHg  VENTILATOR SETTINGS: Vent Mode: PSV;CPAP FiO2 (%):  [40 %-50 %] 40 % Set Rate:  [24 bmp] 24 bmp Vt Set:  [350 mL] 350 mL PEEP:  [5 cmH20] 5 cmH20 Pressure Support:  [5 cmH20] 5 cmH20 Plateau Pressure:  [18 cmH20-19 cmH20] 19 cmH20  INTAKE / OUTPUT: I/O last 3 completed shifts: In: 9165.5 [I.V.:8544.2; NG/GT:371.3; IV Piggyback:250] Out: 3300 [Urine:3300]  PHYSICAL EXAMINATION:  General - sedated Eyes - pupils reactive ENT - ETT in place Cardiac - irregular, no murmur Chest - basilar crackles Abd - soft, non tender Ext - no edema Skin - no rashes Neuro - follows simple commands    LABS:  BMET  Recent Labs Lab 11/19/16 2221 11/20/16 0417 11/21/16 0326  NA 139 138 138  K 4.0 4.0 2.9*  CL 102 102 102  CO2 _1 BUN 40* 37* 35*  CREATININE 1.62* 1.62* 1.37*  GLUCOSE 109* 123* 158*    Electrolytes  Recent Labs Lab 11/19/16 0452 11/19/16 2221 11/20/16 0417 11/21/16 0326  CALCIUM 8.1* 7.3* 7.1* 6.5*  MG 2.0 1.7 1.6* 1.9  PHOS 5.9*  --  4.2  --     CBC  Recent Labs Lab 11/19/16 0452 11/20/16 0417 11/21/16 0326  WBC 25.0* 22.3* 19.4*  HGB 10.7* 10.2* 10.0*  HCT 37.2 35.1* 33.6*  PLT  136* 151 143*    Coag's  Recent Labs Lab 12/14/2016 0755 11/19/16 0452  11/19/16 2041 11/20/16 0951 11/20/16 1529  APTT  --  40*  < > 77* 153* 147*  INR 1.93 5.60*  --   --   --   --   < > = values in this interval not displayed.  Sepsis Markers  Recent Labs Lab 11/30/2016 1538 11/19/16 0452 11/20/16 0417  PROCALCITON 0.12 0.65 1.47    ABG  Recent Labs Lab 11/19/16 0352 11/19/16 1757 11/21/16 0355  PHART 7.338* 7.393 7.378  PCO2ART 41.8 41.2 43.3  PO2ART 107 68.0* 80.8*    Liver Enzymes  Recent Labs Lab 11/19/16 0452 11/20/16 0417 11/21/16 0326  AST 4,890* 4,360* 2,143*  ALT 2,759* 2,902* 1,961*  ALKPHOS 154* 185* 193*  BILITOT 3.4* 4.0* 5.4*  ALBUMIN 3.0* 2.6* 2.4*    Cardiac Enzymes  Recent Labs Lab 12/02/2016 1441 12/12/2016 1954  TROPONINI 0.90* 1.53*    Glucose  Recent Labs Lab 11/20/16 1141 11/20/16 1634 11/20/16 1957 11/20/16 2316 11/21/16 0340 11/21/16 0741  GLUCAP 105* 127* 188* 147* 144* 153*    Imaging Dg Chest Port 1 View  Result Date: 11/21/2016 CLINICAL DATA:  Intubated patient, respiratory failure  EXAM: PORTABLE CHEST 1 VIEW COMPARISON:  Portable chest x-ray of November 20, 2016 FINDINGS: The lungs are reasonably well inflated but confluent density is present within the lower 2/3 of the right lung and hazy increased density is noted throughout the left lung. The hemidiaphragms are obscured. The cardiac silhouette is enlarged. The patient has undergone previous median sternotomy. There is calcification in the wall of the aortic arch. The pulmonary vascularity is engorged. The endotracheal tube tip lies approximately 3.2 cm above the carina. The esophagogastric tube tip and proximal port project below the GE junction. The left internal jugular venous catheter tip projects over the distal third of the SVC. IMPRESSION: CHF with pulmonary interstitial and alveolar edema. Basilar and mid lung density on the right may reflect pneumonia. Bilateral  pleural effusions are present. There has been little change since yesterday's study. The support devices are in reasonable position. Thoracic aortic atherosclerosis. Electronically Signed   By: David  Martinique M.D.   On: 11/21/2016 07:57     STUDIES:  CT Head 6/3 > Chronic ski make atrophic changes without acute abnormality [place] ABD Limited RUQ 6/4 > Gallbladder wall thickening and pericholecystic fluid may reflect acalculous cholecystitis. Heterogeneous hepatic echotexture is nonspecific. Normal appearing common bile duct. ECHO 6/4 > EF 25-30, severely reduced systolic function, PA pressure 56  CULTURES: Urine 6/3 >> Staph aureus Blood 6/3 >> Pneumococcal Ag /603 >> positive Sputum 6/4 >>  ANTIBIOTICS: Rocephin 6/04 >>  Vancomycin 6/05 >>   SIGNIFICANT EVENTS: 6/03 presents to ED   LINES/TUBES: ETT 6/3 >> CVC Left IJ 6/3 >> Left Axillary Aline 6/4 >>  DISCUSSION: 81 yo female with VDRF from Pneumococcal PNA, septic shock, acute combined CHF, A fib with RVR, COPD with emphysema.  She has reported hx of ILD/NSIP, but recent chest imaging and serology is not consistent with this.  She also has history of pulmonary hypertension.  ASSESSMENT / PLAN:  Acute on chronic hypoxic respiratory failure. Hx of COPD with emphysema. - pressure support wean >> not ready for extubation yet - f/u CXR - prn BDs  Pneumococcal pneumonia with septic shock. Staph aureus in urine culture. - Day 3 of rocephin, Day 2 of vancomycin - d/c a line >> not functioning - wean pressors to keep MAP > 65 - goal CVP 10 to 12  Acute combined CHF. Hx of CAD s/p CABG. A fib with RVR. - continue heparin gtt - amiodarone per cardiology - hold lipitor, eliquis in setting of elevated LFTs  Pulmonary hypertension. 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 - LFT's trending down - f/u LFTs  Anemia of critical illness and  chronic disease. - f/u CBC  Acute renal failure >> baseline creatinine 0.66 from 10/31/16. Hyokalemia - f/u BMET  Acute metabolic encephalopathy. - RASS goal 0  DVT prophylaxis - heparin gtt SUP - protonix Nutrition - trickle tube feeds Goals of care - DNR  CC time 74 minutes  Chesley Mires, MD [place] 11/21/2016, 8:31 AM Pager:  [phone] After 3pm call: [phone]

## 2016-11-21 NOTE — Progress Notes (Signed)
Pt desat 80-85% on 40%.  Fio2 increased to 50%, sat now 97%- RN aware.

## 2016-11-21 NOTE — Progress Notes (Signed)
RN called d/t pt desat and vent alarming low minute volume after pt received sedation d/t agitation.  Pt placed back on full vent support.

## 2016-11-21 NOTE — Progress Notes (Signed)
eLink Physician-Brief Progress Note Patient Name: Cristina Parker DOB: 26-Jan-1936 MRN: [id number]   Date of Service  11/21/2016  HPI/Events of Note  K+ = 2.8 and Creatinine = 1.38.  eICU Interventions  Will replace K+.     Intervention Category Major Interventions: Electrolyte abnormality - evaluation and management  Shaquela Weichert [place] 11/21/2016, 6:01 PM

## 2016-11-22 ENCOUNTER — Inpatient Hospital Stay (HOSPITAL_COMMUNITY): Payer: Medicare Other

## 2016-11-22 LAB — COOXEMETRY PANEL
Carboxyhemoglobin: 1 % (ref 0.5–1.5)
Carboxyhemoglobin: 1.2 % (ref 0.5–1.5)
METHEMOGLOBIN: 1.1 % (ref 0.0–1.5)
METHEMOGLOBIN: 1 % (ref 0.0–1.5)
O2 SAT: 44.1 %
O2 Saturation: 63.1 %
TOTAL HEMOGLOBIN: 10.7 g/dL — AB (ref 12.0–16.0)
Total hemoglobin: 10.5 g/dL — ABNORMAL LOW (ref 12.0–16.0)

## 2016-11-22 LAB — COMPREHENSIVE METABOLIC PANEL
ALT: 1463 U/L — ABNORMAL HIGH (ref 14–54)
ANION GAP: 10 (ref 5–15)
AST: 1104 U/L — ABNORMAL HIGH (ref 15–41)
Albumin: 2.3 g/dL — ABNORMAL LOW (ref 3.5–5.0)
Alkaline Phosphatase: 208 U/L — ABNORMAL HIGH (ref 38–126)
BILIRUBIN TOTAL: 7.1 mg/dL — AB (ref 0.3–1.2)
BUN: 35 mg/dL — ABNORMAL HIGH (ref 6–20)
CHLORIDE: 97 mmol/L — AB (ref 101–111)
CO2: 29 mmol/L (ref 22–32)
Calcium: 6.7 mg/dL — ABNORMAL LOW (ref 8.9–10.3)
Creatinine, Ser: 1.38 mg/dL — ABNORMAL HIGH (ref 0.44–1.00)
GFR, EST AFRICAN AMERICAN: 41 mL/min — AB (ref >60)
GFR, EST NON AFRICAN AMERICAN: 35 mL/min — AB (ref >60)
Glucose, Bld: 158 mg/dL — ABNORMAL HIGH (ref 65–99)
POTASSIUM: 3.6 mmol/L (ref 3.5–5.1)
Sodium: 136 mmol/L (ref 135–145)
TOTAL PROTEIN: 4.5 g/dL — AB (ref 6.5–8.1)

## 2016-11-22 LAB — CBC
HCT: 33.7 % — ABNORMAL LOW (ref 36.0–46.0)
HEMOGLOBIN: 10.3 g/dL — AB (ref 12.0–15.0)
MCH: 28.5 pg (ref 26.0–34.0)
MCHC: 30.6 g/dL (ref 30.0–36.0)
MCV: 93.1 fL (ref 78.0–100.0)
PLATELETS: 128 10*3/uL — AB (ref 150–400)
RBC: 3.62 MIL/uL — AB (ref 3.87–5.11)
RDW: 16.1 % — ABNORMAL HIGH (ref 11.5–15.5)
WBC: 20.4 10*3/uL — AB (ref 4.0–10.5)

## 2016-11-22 LAB — GLUCOSE, CAPILLARY
GLUCOSE-CAPILLARY: 139 mg/dL — AB (ref 65–99)
GLUCOSE-CAPILLARY: 137 mg/dL — AB (ref 65–99)
GLUCOSE-CAPILLARY: 141 mg/dL — AB (ref 65–99)
Glucose-Capillary: 160 mg/dL — ABNORMAL HIGH (ref 65–99)
Glucose-Capillary: 124 mg/dL — ABNORMAL HIGH (ref 65–99)

## 2016-11-22 LAB — HEPARIN LEVEL (UNFRACTIONATED): HEPARIN UNFRACTIONATED: 0.37 [IU]/mL (ref 0.30–0.70)

## 2016-11-22 MED ORDER — FUROSEMIDE 10 MG/ML IJ SOLN
40.0000 mg | Freq: Once | INTRAMUSCULAR | Status: AC
  Administered 2016-11-22: 40 mg via INTRAVENOUS
  Filled 2016-11-22: qty 4

## 2016-11-22 MED ORDER — POTASSIUM CHLORIDE 20 MEQ/15ML (10%) PO SOLN
40.0000 meq | Freq: Once | ORAL | Status: AC
  Administered 2016-11-22: 40 meq via ORAL
  Filled 2016-11-22: qty 30

## 2016-11-22 MED ORDER — DIGOXIN 125 MCG PO TABS
0.1250 mg | ORAL_TABLET | Freq: Every day | ORAL | Status: DC
  Administered 2016-11-22 – 2016-11-23 (×2): 0.125 mg via ORAL
  Filled 2016-11-22 (×2): qty 1

## 2016-11-22 NOTE — Progress Notes (Signed)
PULMONARY / CRITICAL CARE MEDICINE   Name: Cristina Parker MRN: [id number] DOB: Nov 09, 1935    ADMISSION DATE:  11/24/2016 CONSULTATION DATE:  12/09/2016  REFERRING MD:  Dr. Posey Pronto   CHIEF COMPLAINT:  Dyspnea   HPI: 81 yo female presented with dyspnea, edema, wt gain, hypoxia, wheezing.  She was in A fib with RVR.  She required intubation.  She has PMHx of COPD with emphysema, CAD s/p CABG, A fib, diastolic CHF, and pulmonary hypertension on macitentan as outpt.  SUBJECTIVE:  Intermittent hypoxia.  Remains on pressors.  VITAL SIGNS: BP (!) 82/61   Pulse (!) 115   Temp 98.1 F (36.7 C) (Axillary)   Resp (!) 24   Ht _0  (1.499 m)   Wt 136 lb 9.6 oz (62 kg)   SpO2 96%   BMI 27.59 kg/m   VENTILATOR SETTINGS: Vent Mode: PRVC FiO2 (%):  [40 %-50 %] 50 % Set Rate:  [24 bmp] 24 bmp Vt Set:  [350 mL] 350 mL PEEP:  [5 cmH20] 5 cmH20 Pressure Support:  [5 cmH20-12 cmH20] 5 cmH20 Plateau Pressure:  [16 cmH20-23 cmH20] 19 cmH20  INTAKE / OUTPUT: I/O last 3 completed shifts: In: 5688.1 [I.V.:3659.7; NG/GT:1228.3; IV Piggyback:800] Out: 8119 [Urine:5585]  PHYSICAL EXAMINATION:  General - RASS -1 Eyes - pupils reactive ENT - ETT in place Cardiac - irregular, no murmur Chest - basilar rales Abd - soft, non tender Ext - 2+ edema Skin - no rashes Neuro - follows simple commands    LABS:  BMET  Recent Labs Lab 11/21/16 0326 11/21/16 1603 11/22/16 0322  NA 138 138 136  K 2.9* 2.8* 3.6  CL 102 99* 97*  CO2 _1 BUN 35* 35* 35*  CREATININE 1.37* 1.38* 1.38*  GLUCOSE 158* 206* 158*    Electrolytes  Recent Labs Lab 11/19/16 0452  11/20/16 0417 11/21/16 0326 11/21/16 1603 11/22/16 0322  CALCIUM 8.1*  < > 7.1* 6.5* 6.7* 6.7*  MG 2.0  < > 1.6* 1.9 2.1  --   PHOS 5.9*  --  4.2  --   --   --   < > = values in this interval not displayed.  CBC  Recent Labs Lab 11/20/16 0417 11/21/16 0326 11/22/16 0322  WBC 22.3* 19.4* 20.4*  HGB 10.2* 10.0* 10.3*   HCT 35.1* 33.6* 33.7*  PLT 151 143* 128*    Coag's  Recent Labs Lab 12/01/2016 0755 11/19/16 0452  11/19/16 2041 11/20/16 0951 11/20/16 1529  APTT  --  40*  < > 77* 153* 147*  INR 1.93 5.60*  --   --   --   --   < > = values in this interval not displayed.  Sepsis Markers  Recent Labs Lab 12/07/2016 1538 11/19/16 0452 11/20/16 0417  PROCALCITON 0.12 0.65 1.47    ABG  Recent Labs Lab 11/19/16 0352 11/19/16 1757 11/21/16 0355  PHART 7.338* 7.393 7.378  PCO2ART 41.8 41.2 43.3  PO2ART 107 68.0* 80.8*    Liver Enzymes  Recent Labs Lab 11/20/16 0417 11/21/16 0326 11/22/16 0322  AST 4,360* 2,143* 1,104*  ALT 2,902* 1,961* 1,463*  ALKPHOS 185* 193* 208*  BILITOT 4.0* 5.4* 7.1*  ALBUMIN 2.6* 2.4* 2.3*    Cardiac Enzymes  Recent Labs Lab 12/15/2016 1441 12/14/2016 1954  TROPONINI 0.90* 1.53*    Glucose  Recent Labs Lab 11/21/16 1132 11/21/16 1610 11/21/16 1921 11/21/16 2333 11/22/16 0341 11/22/16 0819  GLUCAP 169* 173* 155* 149* 139* 137*  Imaging Dg Chest Port 1 View  Result Date: 11/22/2016 CLINICAL DATA:  Intubated patient, respiratory failure, septic shock, CHF. EXAM: PORTABLE CHEST 1 VIEW COMPARISON:  Portable chest x-ray of November 21, 2016 FINDINGS: The lungs are hypoinflated. Considerable volume loss on the right persists with obscuration of the hemidiaphragm. The left lung is better inflated. The interstitial markings of both lungs remain increased but have improved slightly. The cardiac silhouette remains enlarged. The pulmonary vascularity is less engorged. The endotracheal tube tip lies approximately 1.9 cm above the carina. The esophagogastric tube tip in proximal port project below the GE junction. The left internal jugular venous catheter tip projects at the cavoatrial junction. IMPRESSION: Slight interval improvement in pulmonary interstitial edema. Persistent interstitial prominence bilaterally. Small bilateral pleural effusions greatest on  the right which may obscure atelectasis or pneumonia. Stable cardiomegaly with decreased pulmonary vascular congestion. The support tubes are in reasonable position. Electronically Signed   By: David  Martinique M.D.   On: 11/22/2016 07:28     STUDIES:  CT Head 6/3 > Chronic ski make atrophic changes without acute abnormality [place] ABD Limited RUQ 6/4 > Gallbladder wall thickening and pericholecystic fluid may reflect acalculous cholecystitis. Heterogeneous hepatic echotexture is nonspecific. Normal appearing common bile duct. ECHO 6/4 > EF 25-30, severely reduced systolic function, PA pressure 56  CULTURES: Urine 6/3 >> MSSA Blood 6/3 >> Pneumococcal Ag /603 >> positive Sputum 6/4 >> oral flora  ANTIBIOTICS: Rocephin 6/04 >>  Vancomycin 6/05 >> 6/06  SIGNIFICANT EVENTS: 6/03 presents to ED   LINES/TUBES: ETT 6/3 >> CVC Left IJ 6/3 >> Left Axillary Aline 6/4 >> 6/06  DISCUSSION: 81 yo female with VDRF from Pneumococcal PNA, septic shock, acute combined CHF, A fib with RVR, COPD with emphysema.  She has reported hx of ILD/NSIP, but recent chest imaging and serology is not consistent with this.  She also has history of pulmonary hypertension.  ASSESSMENT / PLAN:  Acute on chronic hypoxic respiratory failure. Hx of COPD with emphysema. - pressure support wean >> not ready for extubation - discussed possibility of trach >> family considering whether she would want this - f/u CXR - prn BDs  Pneumococcal pneumonia with septic shock. MSSA in urine culture. - day 4 of rocephin - wean pressors to keep MAP > 65 - goal CVP < 10  Acute combined CHF. Hx of CAD s/p CABG. A fib with RVR. - heparin gtt, amiodarone per cardiology - continue lasix - hold lipitor, eliquis in setting of elevated LFTs  Pulmonary hypertension. 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 - LFT's trending down - f/u  LFTs  Anemia of critical illness and chronic disease. Thrombocytopenia. - f/u CBC  Acute renal failure >> baseline creatinine 0.66 from 10/31/16. Hyokalemia - f/u BMET  Acute metabolic encephalopathy. - RASS goal 0  Moderate protein calorie malnutrition. - tube feeds  Hyperglycemia. - SSI  DVT prophylaxis - heparin gtt SUP - protonix Nutrition - tube feeds Goals of care - DNR  Updated pt's family at bedside.  CC time 36 minutes  Chesley Mires, MD [place] 11/22/2016, 11:47 AM Pager:  [phone] After 3pm call: [phone]

## 2016-11-22 NOTE — Progress Notes (Signed)
RN called d/t pt having some desaturation issues s/p transport to 2H01, and requested pt be placed on rest mode.  Pt placed back on full vent support at this time.

## 2016-11-22 NOTE — Progress Notes (Signed)
Patient ID: Cristina Parker, female   DOB: May 29, 1936, 81 y.o.   MRN: [id number]   SUBJECTIVE:  Pt remains intubated but at times awake on vent. Currently attempting wean.   CVP 9-10. Weight unchanged. Negative 1.1 L and Urine output 4.1 L.Marland Kitchen LFTs trending down.  HR back up into 110s.    Leg cuff BP matched to axillary arterial line BP and seemed to match fairly well. (were concerned with accuracy with subclavian stenosis)  Coox 44% on Norepi at 17 and vasopressin at 0.03. Repeat pending.   Scheduled Meds: . chlorhexidine gluconate (MEDLINE KIT)  15 mL Mouth Rinse BID  . Chlorhexidine Gluconate Cloth  6 each Topical Daily  . feeding supplement (VITAL HIGH PROTEIN)  1,000 mL Per Tube Q24H  . insulin aspart  0-15 Units Subcutaneous Q4H  . mouth rinse  15 mL Mouth Rinse 10 times per day  . pantoprazole sodium  40 mg Per Tube Q24H  . sodium chloride flush  10-40 mL Intracatheter Q12H  . sodium chloride flush  3 mL Intravenous Q12H   Continuous Infusions: . sodium chloride 250 mL (11/20/16 1800)  . amiodarone 30 mg/hr (11/22/16 0528)  . cefTRIAXone (ROCEPHIN)  IV Stopped (11/21/16 1638)  . heparin 750 Units/hr (11/22/16 0527)  . norepinephrine (LEVOPHED) Adult infusion 17 mcg/min (11/22/16 0200)  . phenylephrine (NEO-SYNEPHRINE) Adult infusion Stopped (11/21/16 0109)  . vancomycin Stopped (11/21/16 1424)  . vasopressin (PITRESSIN) infusion - *FOR SHOCK* 0.03 Units/min (11/21/16 2030)   PRN Meds:.fentaNYL (SUBLIMAZE) injection, ipratropium-albuterol, midazolam, ondansetron (ZOFRAN) IV, sennosides, sodium chloride flush    Vitals:   11/22/16 0430 11/22/16 0500 11/22/16 0552 11/22/16 0600  BP: 102/68 (!) 82/72  (!) 80/59  Pulse: (!) 122 (!) 113 (!) 116 (!) 110  Resp: (!) 24 (!) 24 (!) 24 (!) 24  Temp:      TempSrc:      SpO2: 93% 95% (!) 88% 96%  Weight:      Height:        Intake/Output Summary (Last 24 hours) at 11/22/16 0804 Last data filed at 11/22/16 0600  Gross per 24  hour  Intake          2891.03 ml  Output             3875 ml  Net          -983.97 ml    LABS: Basic Metabolic Panel:  Recent Labs  11/20/16 0417 11/21/16 0326 11/21/16 1603 11/22/16 0322  NA 138 138 138 136  K 4.0 2.9* 2.8* 3.6  CL 102 102 99* 97*  CO2 _0 GLUCOSE 123* 158* 206* 158*  BUN 37* 35* 35* 35*  CREATININE 1.62* 1.37* 1.38* 1.38*  CALCIUM 7.1* 6.5* 6.7* 6.7*  MG 1.6* 1.9 2.1  --   PHOS 4.2  --   --   --    Liver Function Tests:  Recent Labs  11/21/16 0326 11/22/16 0322  AST 2,143* 1,104*  ALT 1,961* 1,463*  ALKPHOS 193* 208*  BILITOT 5.4* 7.1*  PROT 4.3* 4.5*  ALBUMIN 2.4* 2.3*   No results for input(s): LIPASE, AMYLASE in the last 72 hours. CBC:  Recent Labs  11/21/16 0326 11/22/16 0322  WBC 19.4* 20.4*  HGB 10.0* 10.3*  HCT 33.6* 33.7*  MCV 95.2 93.1  PLT 143* 128*   Cardiac Enzymes: No results for input(s): CKTOTAL, CKMB, CKMBINDEX, TROPONINI in the last 72 hours. BNP: Invalid input(s): POCBNP D-Dimer: No results for input(s): DDIMER  in the last 72 hours. Hemoglobin A1C: No results for input(s): HGBA1C in the last 72 hours. Fasting Lipid Panel: No results for input(s): CHOL, HDL, LDLCALC, TRIG, CHOLHDL, LDLDIRECT in the last 72 hours. Thyroid Function Tests: No results for input(s): TSH, T4TOTAL, T3FREE, THYROIDAB in the last 72 hours.  Invalid input(s): FREET3 Anemia Panel: No results for input(s): VITAMINB12, FOLATE, FERRITIN, TIBC, IRON, RETICCTPCT in the last 72 hours.  RADIOLOGY: Ct Head Wo Contrast  Result Date: 11/16/2016 CLINICAL DATA:  Altered mental status EXAM: CT HEAD WITHOUT CONTRAST TECHNIQUE: Contiguous axial images were obtained from the base of the skull through the vertex without intravenous contrast. COMPARISON:  08/17/2013 FINDINGS: Brain: Diffuse atrophic changes are noted. Mild chronic white matter ischemic change is noted and stable. No findings to suggest acute hemorrhage, acute infarction or  space-occupying mass lesion are noted. Mild basal ganglia calcifications are seen bilaterally. Vascular: No hyperdense vessel or unexpected calcification. Skull: Normal. Negative for fracture or focal lesion. Sinuses/Orbits: No acute finding. Other: Nasogastric catheter is noted coiled within the posterior nasopharynx. IMPRESSION: Chronic ski make and atrophic changes without acute abnormality. Nasogastric catheter coiled within the posterior nasopharynx Electronically Signed   By: Inez Catalina M.D.   On: 11/30/2016 16:28   Dg Chest Port 1 View  Result Date: 11/22/2016 CLINICAL DATA:  Intubated patient, respiratory failure, septic shock, CHF. EXAM: PORTABLE CHEST 1 VIEW COMPARISON:  Portable chest x-ray of November 21, 2016 FINDINGS: The lungs are hypoinflated. Considerable volume loss on the right persists with obscuration of the hemidiaphragm. The left lung is better inflated. The interstitial markings of both lungs remain increased but have improved slightly. The cardiac silhouette remains enlarged. The pulmonary vascularity is less engorged. The endotracheal tube tip lies approximately 1.9 cm above the carina. The esophagogastric tube tip in proximal port project below the GE junction. The left internal jugular venous catheter tip projects at the cavoatrial junction. IMPRESSION: Slight interval improvement in pulmonary interstitial edema. Persistent interstitial prominence bilaterally. Small bilateral pleural effusions greatest on the right which may obscure atelectasis or pneumonia. Stable cardiomegaly with decreased pulmonary vascular congestion. The support tubes are in reasonable position. Electronically Signed   By: David  Martinique M.D.   On: 11/22/2016 07:28   Dg Chest Port 1 View  Result Date: 11/21/2016 CLINICAL DATA:  Intubated patient, respiratory failure EXAM: PORTABLE CHEST 1 VIEW COMPARISON:  Portable chest x-ray of November 20, 2016 FINDINGS: The lungs are reasonably well inflated but confluent density  is present within the lower 2/3 of the right lung and hazy increased density is noted throughout the left lung. The hemidiaphragms are obscured. The cardiac silhouette is enlarged. The patient has undergone previous median sternotomy. There is calcification in the wall of the aortic arch. The pulmonary vascularity is engorged. The endotracheal tube tip lies approximately 3.2 cm above the carina. The esophagogastric tube tip and proximal port project below the GE junction. The left internal jugular venous catheter tip projects over the distal third of the SVC. IMPRESSION: CHF with pulmonary interstitial and alveolar edema. Basilar and mid lung density on the right may reflect pneumonia. Bilateral pleural effusions are present. There has been little change since yesterday's study. The support devices are in reasonable position. Thoracic aortic atherosclerosis. Electronically Signed   By: David  Martinique M.D.   On: 11/21/2016 07:57   Dg Chest Port 1 View  Result Date: 11/20/2016 CLINICAL DATA:  ET tube, respiratory failure EXAM: PORTABLE CHEST 1 VIEW COMPARISON:  11/19/2016 FINDINGS: Endotracheal tube  and left central line as well as NG tube remain in place, unchanged. Moderate bilateral layering effusions. Diffuse bilateral airspace disease, likely edema. Mild cardiomegaly. IMPRESSION: Moderate layering bilateral pleural effusions with bilateral airspace disease, likely CHF. No real change. Electronically Signed   By: Rolm Baptise M.D.   On: 11/20/2016 07:35   Dg Chest Port 1 View  Result Date: 11/19/2016 CLINICAL DATA:  Hypoxia EXAM: PORTABLE CHEST 1 VIEW COMPARISON:  November 18, 2016 FINDINGS: Endotracheal tube tip is 1.2 cm above the carina. Nasogastric tube tip and side port are below the diaphragm. Central catheter tip is in the superior vena cava. No pneumothorax. There are pleural effusions bilaterally with underlying interstitial edema. There is atelectatic change in the lung bases. Heart is mildly enlarged  with pulmonary vascularity within normal limits. There is aortic atherosclerosis. No adenopathy evident. There is postoperative change in the lower cervical spine. IMPRESSION: Tube and catheter positions as described without pneumothorax. The endotracheal tube tip is near the carina; advise withdrawing endotracheal tube approximately 2 cm. Evidence a degree of underlying congestive heart failure. Bibasilar atelectasis. There is aortic atherosclerosis. Electronically Signed   By: Lowella Grip III M.D.   On: 11/19/2016 07:06   Dg Chest Portable 1 View  Result Date: 12/14/2016 CLINICAL DATA:  Central line placement EXAM: PORTABLE CHEST 1 VIEW COMPARISON:  11/22/2016 FINDINGS: Left central line tip is in the upper right atrium. No pneumothorax. Cardiomegaly. Bilateral layering effusions and bilateral airspace disease, likely edema/ CHF. NG tube and endotracheal tube are unchanged. Left central line tip in the upper right atrium. No pneumothorax. IMPRESSION: Continued bilateral airspace disease and layering effusions, likely edema/CHF. Electronically Signed   By: Rolm Baptise M.D.   On: 12/03/2016 16:51   Dg Chest Port 1 View  Result Date: 12/09/2016 CLINICAL DATA:  Patient status post intubation. EXAM: PORTABLE CHEST 1 VIEW COMPARISON:  Chest radiograph 11/29/2016 FINDINGS: ET tube terminates in the mid trachea. Enteric tube tip and side-port project over the stomach. Pacer apparatus overlies the left hemithorax. Patient status post median sternotomy. Low lung volumes. Persistent moderate right and small left pleural effusions with underlying pulmonary consolidation which appears worsened within the right greater than left mid and lower lungs. No pneumothorax. Osseous destruction right humeral head. IMPRESSION: ET tube terminates in the mid trachea. Worsening moderate right and small left pleural effusions with increasing bilateral underlying heterogeneous pulmonary opacities which may represent atelectasis  or infection. Electronically Signed   By: Lovey Newcomer M.D.   On: 12/07/2016 14:17   Dg Chest Port 1 View  Result Date: 12/04/2016 CLINICAL DATA:  Shortness of breath for 1 day. EXAM: PORTABLE CHEST 1 VIEW COMPARISON:  06/20/2016 and prior exams FINDINGS: Cardiomegaly and CABG changes noted. A new moderate right pleural effusion noted with right lower lung atelectasis. A small left pleural effusion has enlarged with associated left basilar atelectasis. There is no evidence of pneumothorax. No acute bony abnormality noted. Severe right shoulder degenerative changes and cervical spine surgical changes noted. IMPRESSION: New moderate right pleural effusion and right lower lung atelectasis. Slightly increased small left pleural effusion with left basilar atelectasis. Cardiomegaly and CABG changes. Electronically Signed   By: Margarette Canada M.D.   On: 12/10/2016 08:27   US Abdomen Limited Ruq  Result Date: 11/19/2016 CLINICAL DATA:  Transaminitis EXAM: US ABDOMEN LIMITED - RIGHT UPPER QUADRANT COMPARISON:  CT scan the abdomen pelvis of January 01, 2014 FINDINGS: The study was done portably. The patient is intubated. Defibrillation pads  limit the acoustical window. Gallbladder: The gallbladder is adequately distended. There is gallbladder wall thickening to 11.6 mm. There is pericholecystic fluid. There is no positive sonographic Murphy's sign. No stones or sludge are evident. Common bile duct: Diameter: 3.8 mm Liver: The hepatic echotexture is heterogeneous. There is no focal mass nor ductal dilation. IMPRESSION: Gallbladder wall thickening and pericholecystic fluid may reflect acalculous cholecystitis. Heterogeneous hepatic echotexture is nonspecific. Normal appearing common bile duct. Electronically Signed   By: David  Martinique M.D.   On: 11/19/2016 07:05    PHYSICAL EXAM  General: Intubated. Awake.  Nods heads to questioning. . No resp difficulty. HEENT: normal Neck: supple. JVP 9-10 cm. Carotids 2+ bilat; no  bruits. No thyromegaly or nodule noted. Cor: PMI nondisplaced. RRR, No M/G/R noted Lungs: + Mechanical breathing sounds, mildly diminished anteriorly.  Abdomen: soft, non-tender, distended, no HSM. No bruits or masses. +BS  Extremities: no cyanosis, clubbing, or rash. Trace to 1+ BLE edema.  Neuro: Intubated. Awake.   TELEMETRY: Personally reviewed, telemetry pt in atrial fibrillation 110s   ASSESSMENT AND PLAN: 81 yo with extensive history of PAD, CAD s/p CABG, interstitial lung disease, pulmonary hypertension is seen for shock and acute hypoxemic respiratory failure.  1. Shock: Suspect mixed cardiogenic and septic shock.  Echo this admission showed EF 25-30%, dyskinetic anteroseptal wall and apex, mild to moderately decreased RV systolic function, PASP 56 mmHg.  Prior echo in 5/17 with EF 50-55%.  She has Staph aureus in urine, suspect Staph bacteremia, ?PNA source.  Currently requiring norepinephrine and vasopressin, able to titrate off phenylephrine yesterday.  Co-ox 44% this morning despite increase in pressors. Stat recheck pending.  - Remains on vancomycin/ceftriaxone. S aureus from urine pan-sensitive. Can narrow.  - Continue to titrate down pressors as able.     - Given history of subclavian stenosis, we checked BP by cuff on thigh and it actually matched the axillary arterial line measurement pretty well. 2. Acute systolic CHF: New fall in EF noted on echo this admission, 25-30% compared to 50-55% in 5/17.  Anteroseptal wall is dyskinetic, fall in EF may not be acute.  I am concerned that Cristina Parker has compromised flow to her LIMA (left subclavian appeared severely stenosed to occluded on prior doppler evaluation).   - CVP 9-10. Will give 40 mg IV lasix x 1.   Supp K . - Follow co-ox, adequate for now.  3. Atrial fibrillation with RVR: New. HR down to 80s in atrial fibrillation.    - Continue heparin gtt. - With low EF and shock, would avoid diltiazem gtt.  - Amiodarone use  concerning with high LFTs, but LFTs are likely high due to shock liver. Trending down.   - Continue amiodarone.  - Start digoxin 0.125 mg daily. Will need to follow levels closely.  4. Elevated LFTs: Hepatitis viral labs negative.  Abdominal US unremarkable.  Suspect shock liver in setting of hypotension. LFTs continue to trend down.  As above, would continue amiodarone for now.  5. AKI: Creatinine relatively  6. ID: Concern for PNA with septic shock.  Has Staph aureus in urine so may be triggering organism. WBCs and PCT high. Broad spectrum abx per CCM.  - WBC 20.4. Remains on vanc and ceftriaxone 7. Pulmonary hypertension: She was on Opsumit in the past, stopped in 5/18 with concern for increased swelling.  No change.  8. Interstitial lung disease: Sees Dr. Chase Caller.  Possible NSIP. No change.  9. Subclavian stenosis: Bilateral, left subclavian stenosis severe  on 2016 doppler evaluation. No change. ART line and cuff pressures accurate. Correlated with thigh cuff.  10. CAD: s/p CABG.  Holding statin with increased LFTs. Fall in EF between 5/17 and 6/18 is concerning.  With anteroseptal dyskinesis, Dr. Aundra Dubin is concerned that she may have lost her LIMA => at risk with history of severe left subclavian stenosis. Will need to consider further work up once/if stabilizes.  11. Hypokalemia - Improved. Continue to supp.   Prognosis extremely guarded.  Will gently diurese in attempt to help wean vent.   Shirley Friar, PA-C  11/22/2016 8:04 AM   Advanced Heart Failure Team Pager [phone] (M-F; 7a - 4p)  Please contact [facility] for night-coverage after hours (4p -7a ) and weekends on [email or web address]  Patient seen with PA, agree with the above note.  Repeat co-ox 63% which is adequate.  CVP 8-9, agree with Lasix 40 mg IV x 1 now.    She remains in atrial fibrillation with HR in 110s, on amiodarone.  Digoxin started yesterday with improvement in creatinine.   Septic shock, remains on  norepinephrine/vasopressin.  Have not been able to wean today.    Guarded prognosis.  To discuss tracheostomy with CCM.  Palliative care consult would be appropriate for goals of care.   Loralie Champagne 11/22/2016 1:13 PM

## 2016-11-22 NOTE — Progress Notes (Signed)
ANTICOAGULATION CONSULT NOTE - Follow Up Consult  Pharmacy Consult for IV heparin Indication: atrial fibrillation  No Known Allergies  Patient Measurements: Height: 4' 11" (149.9 cm) Weight: 136 lb 9.6 oz (62 kg) IBW/kg (Calculated) : 43.2 Heparin Dosing Weight: 58 kg  Vital Signs: Temp: 98.1 F (36.7 C) (06/07 0800) Temp Source: Axillary (06/07 0800) BP: 92/73 (06/07 0805) Pulse Rate: 115 (06/07 0805)  Labs:  Recent Labs  11/19/16 2041  11/20/16 0417 11/20/16 0951 11/20/16 1529 11/21/16 0326 11/21/16 1603 11/22/16 0322  HGB  --   < > 10.2*  --   --  10.0*  --  10.3*  HCT  --   --  35.1*  --   --  33.6*  --  33.7*  PLT  --   --  151  --   --  143*  --  128*  APTT 77*  --   --  153* 147*  --   --   --   HEPARINUNFRC 0.47  --  0.37  --  0.42 0.29*  --  0.37  CREATININE  --   < > 1.62*  --   --  1.37* 1.38* 1.38*  < > = values in this interval not displayed.  Estimated Creatinine Clearance: 26.5 mL/min (A) (by C-G formula based on SCr of 1.38 mg/dL (H)).   Medications: Heparin @ 750 units/hr  Assessment: 81 yo female with hx afib, not on chronic anticoagulation.  CHADSVASC at least 5.  Eliquis given 6/3 (1 dose).  Pharmacist requested to hold Eliquis given drastically elevated LFTs.  Started anticoagulation with IV heparin on 6/4.  Baseline heparin level falsely elevated (reflection of recent Eliquis dose).  Heparin level today is therapeutic at 0.37. CBC stable. LFTs improving. No bleeding.  Goal of Therapy:  Heparin level 0.3-0.7 units/ml  Monitor platelets by anticoagulation protocol: Yes   Plan:  1) Continue heparin at 750 units/hr 2) Daily heparin level and CBC  Nena Jordan, PharmD, BCPS 11/22/2016 10:42 AM

## 2016-11-23 ENCOUNTER — Inpatient Hospital Stay (HOSPITAL_COMMUNITY): Payer: Medicare Other

## 2016-11-23 DIAGNOSIS — Z515 Encounter for palliative care: Secondary | ICD-10-CM

## 2016-11-23 LAB — APTT
APTT: 109 s — AB (ref 24–36)
APTT: 74 s — AB (ref 24–36)
aPTT: 191 seconds (ref 24–36)
aPTT: 55 seconds — ABNORMAL HIGH (ref 24–36)

## 2016-11-23 LAB — COMPREHENSIVE METABOLIC PANEL
ALBUMIN: 2.1 g/dL — AB (ref 3.5–5.0)
ALK PHOS: 229 U/L — AB (ref 38–126)
ALT: 993 U/L — ABNORMAL HIGH (ref 14–54)
AST: 505 U/L — AB (ref 15–41)
Anion gap: 12 (ref 5–15)
BILIRUBIN TOTAL: 8.3 mg/dL — AB (ref 0.3–1.2)
BUN: 43 mg/dL — AB (ref 6–20)
CO2: 33 mmol/L — ABNORMAL HIGH (ref 22–32)
Calcium: 7.1 mg/dL — ABNORMAL LOW (ref 8.9–10.3)
Chloride: 93 mmol/L — ABNORMAL LOW (ref 101–111)
Creatinine, Ser: 1.34 mg/dL — ABNORMAL HIGH (ref 0.44–1.00)
GFR calc Af Amer: 42 mL/min — ABNORMAL LOW (ref >60)
GFR calc non Af Amer: 37 mL/min — ABNORMAL LOW (ref >60)
Glucose, Bld: 153 mg/dL — ABNORMAL HIGH (ref 65–99)
POTASSIUM: 3.2 mmol/L — AB (ref 3.5–5.1)
Sodium: 138 mmol/L (ref 135–145)
TOTAL PROTEIN: 4.5 g/dL — AB (ref 6.5–8.1)

## 2016-11-23 LAB — CBC
HEMATOCRIT: 33.7 % — AB (ref 36.0–46.0)
HEMOGLOBIN: 10.6 g/dL — AB (ref 12.0–15.0)
MCH: 28.6 pg (ref 26.0–34.0)
MCHC: 31.5 g/dL (ref 30.0–36.0)
MCV: 91.1 fL (ref 78.0–100.0)
Platelets: 70 10*3/uL — ABNORMAL LOW (ref 150–400)
RBC: 3.7 MIL/uL — ABNORMAL LOW (ref 3.87–5.11)
RDW: 16.4 % — ABNORMAL HIGH (ref 11.5–15.5)
WBC: 21.7 10*3/uL — AB (ref 4.0–10.5)

## 2016-11-23 LAB — GLUCOSE, CAPILLARY
GLUCOSE-CAPILLARY: 167 mg/dL — AB (ref 65–99)
GLUCOSE-CAPILLARY: 104 mg/dL — AB (ref 65–99)
Glucose-Capillary: 135 mg/dL — ABNORMAL HIGH (ref 65–99)
Glucose-Capillary: 139 mg/dL — ABNORMAL HIGH (ref 65–99)
Glucose-Capillary: 188 mg/dL — ABNORMAL HIGH (ref 65–99)
Glucose-Capillary: 139 mg/dL — ABNORMAL HIGH (ref 65–99)
Glucose-Capillary: 111 mg/dL — ABNORMAL HIGH (ref 65–99)

## 2016-11-23 LAB — COOXEMETRY PANEL
Carboxyhemoglobin: 1 % (ref 0.5–1.5)
Carboxyhemoglobin: 1.4 % (ref 0.5–1.5)
METHEMOGLOBIN: 1.1 % (ref 0.0–1.5)
METHEMOGLOBIN: 0.6 % (ref 0.0–1.5)
O2 SAT: 53.2 %
O2 Saturation: 51.4 %
TOTAL HEMOGLOBIN: 10.7 g/dL — AB (ref 12.0–16.0)
Total hemoglobin: 10.8 g/dL — ABNORMAL LOW (ref 12.0–16.0)

## 2016-11-23 LAB — CULTURE, BLOOD (ROUTINE X 2)
CULTURE: NO GROWTH
Culture: NO GROWTH
SPECIAL REQUESTS: ADEQUATE
SPECIAL REQUESTS: ADEQUATE

## 2016-11-23 LAB — PROTIME-INR
INR: 2.21
Prothrombin Time: 24.9 seconds — ABNORMAL HIGH (ref 11.4–15.2)

## 2016-11-23 LAB — HEPARIN LEVEL (UNFRACTIONATED): Heparin Unfractionated: 0.31 IU/mL (ref 0.30–0.70)

## 2016-11-23 MED ORDER — SODIUM CHLORIDE 0.9 % IV SOLN
0.0400 mg/kg/h | INTRAVENOUS | Status: DC
  Administered 2016-11-23 – 2016-11-25 (×2): 0.05 mg/kg/h via INTRAVENOUS
  Filled 2016-11-23 (×2): qty 250

## 2016-11-23 MED ORDER — FUROSEMIDE 10 MG/ML IJ SOLN
40.0000 mg | Freq: Once | INTRAMUSCULAR | Status: AC
  Administered 2016-11-23: 40 mg via INTRAVENOUS
  Filled 2016-11-23: qty 4

## 2016-11-23 MED ORDER — CHLORHEXIDINE GLUCONATE 0.12 % MT SOLN: 15.0000 mL | Freq: Two times a day (BID) | OROMUCOSAL | Status: DC

## 2016-11-23 MED ORDER — ORAL CARE MOUTH RINSE: 15.0000 mL | Freq: Two times a day (BID) | OROMUCOSAL | Status: DC

## 2016-11-23 MED ORDER — DIGOXIN 0.25 MG/ML IJ SOLN
0.1250 mg | Freq: Every day | INTRAMUSCULAR | Status: DC
  Administered 2016-11-24 – 2016-11-25 (×2): 0.125 mg via INTRAVENOUS
  Filled 2016-11-23 (×2): qty 2

## 2016-11-23 MED ORDER — SODIUM CHLORIDE 0.9 % IV SOLN
0.1000 mg/kg/h | INTRAVENOUS | Status: DC
  Administered 2016-11-23: 0.1 mg/kg/h via INTRAVENOUS
  Filled 2016-11-23: qty 250

## 2016-11-23 MED ORDER — PANTOPRAZOLE SODIUM 40 MG IV SOLR
40.0000 mg | INTRAVENOUS | Status: DC
  Administered 2016-11-24 – 2016-11-25 (×2): 40 mg via INTRAVENOUS
  Filled 2016-11-23 (×2): qty 40

## 2016-11-23 MED ORDER — POTASSIUM CHLORIDE 20 MEQ/15ML (10%) PO SOLN
40.0000 meq | ORAL | Status: AC
  Administered 2016-11-23 (×2): 40 meq
  Filled 2016-11-23 (×2): qty 30

## 2016-11-23 NOTE — Progress Notes (Addendum)
PULMONARY / CRITICAL CARE MEDICINE   Name: Cristina Parker MRN: [id number] DOB: 1936/03/02    ADMISSION DATE:  12/12/2016 CONSULTATION DATE:  11/17/2016  REFERRING MD:  Dr. Posey Pronto   CHIEF COMPLAINT:  Dyspnea   HPI: 81 yo female presented with dyspnea, edema, wt gain, hypoxia, wheezing.  She was in A fib with RVR.  She required intubation.  She has PMHx of COPD with emphysema, CAD s/p CABG, A fib, diastolic CHF, and pulmonary hypertension on macitentan as outpt.  SUBJECTIVE:  Doing better with pressure support.  VITAL SIGNS: BP 91/77   Pulse 95   Temp (!) 96.5 F (35.8 C) (Axillary)   Resp (!) 24   Ht _0  (1.499 m)   Wt 132 lb 8 oz (60.1 kg)   SpO2 99%   BMI 26.76 kg/m   VENTILATOR SETTINGS: Vent Mode: PRVC FiO2 (%):  [50 %-60 %] 50 % Set Rate:  [24 bmp] 24 bmp Vt Set:  [350 mL] 350 mL PEEP:  [5 cmH20] 5 cmH20 Plateau Pressure:  [17 cmH20-20 cmH20] 18 cmH20  INTAKE / OUTPUT: I/O last 3 completed shifts: In: 3807 [I.V.:1807; NG/GT:1750; IV Piggyback:250] Out: 5075 [Urine:5075]  PHYSICAL EXAMINATION:  General - alert Eyes - pupils reactive ENT - ETT in place Cardiac - irregular, no murmur Chest - better air movement, no wheeze Abd - soft, non tender Ext - 1+ edema, Rt hand dusky Skin - no rashes Neuro - follows commands   LABS:  BMET  Recent Labs Lab 11/21/16 1603 11/22/16 0322 11/23/16 0341  NA 138 136 138  K 2.8* 3.6 3.2*  CL 99* 97* 93*  CO2 30 29 33*  BUN 35* 35* 43*  CREATININE 1.38* 1.38* 1.34*  GLUCOSE 206* 158* 153*    Electrolytes  Recent Labs Lab 11/19/16 0452  11/20/16 0417 11/21/16 0326 11/21/16 1603 11/22/16 0322 11/23/16 0341  CALCIUM 8.1*  < > 7.1* 6.5* 6.7* 6.7* 7.1*  MG 2.0  < > 1.6* 1.9 2.1  --   --   PHOS 5.9*  --  4.2  --   --   --   --   < > = values in this interval not displayed.  CBC  Recent Labs Lab 11/21/16 0326 11/22/16 0322 11/23/16 0341  WBC 19.4* 20.4* 21.7*  HGB 10.0* 10.3* 10.6*  HCT 33.6*  33.7* 33.7*  PLT 143* 128* 70*    Coag's  Recent Labs Lab 11/30/2016 0755 11/19/16 0452  11/19/16 2041 11/20/16 0951 11/20/16 1529  APTT  --  40*  < > 77* 153* 147*  INR 1.93 5.60*  --   --   --   --   < > = values in this interval not displayed.  Sepsis Markers  Recent Labs Lab 12/14/2016 1538 11/19/16 0452 11/20/16 0417  PROCALCITON 0.12 0.65 1.47    ABG  Recent Labs Lab 11/19/16 0352 11/19/16 1757 11/21/16 0355  PHART 7.338* 7.393 7.378  PCO2ART 41.8 41.2 43.3  PO2ART 107 68.0* 80.8*    Liver Enzymes  Recent Labs Lab 11/21/16 0326 11/22/16 0322 11/23/16 0341  AST 2,143* 1,104* 505*  ALT 1,961* 1,463* 993*  ALKPHOS 193* 208* 229*  BILITOT 5.4* 7.1* 8.3*  ALBUMIN 2.4* 2.3* 2.1*    Cardiac Enzymes  Recent Labs Lab 12/09/2016 1441 11/28/2016 1954  TROPONINI 0.90* 1.53*    Glucose  Recent Labs Lab 11/22/16 1142 11/22/16 1614 11/22/16 1941 11/22/16 2322 11/23/16 0333 11/23/16 0734  GLUCAP 160* 141* 124* 135* 139* 167*  Imaging Dg Chest Port 1 View  Result Date: 11/23/2016 CLINICAL DATA:  Respiratory failure EXAM: PORTABLE CHEST 1 VIEW COMPARISON:  Yesterday FINDINGS: Endotracheal tube and NG tube are stable. Vascular catheter via the left jugular vein and its tip in the SVC is stable. Normal heart size. Extensive diffuse airspace disease is stable. Hemidiaphragms are obscured. Pleural effusions cannot be excluded. IMPRESSION: Stable diffuse airspace disease. Electronically Signed   By: Marybelle Killings M.D.   On: 11/23/2016 07:22     STUDIES:  CT Head 6/3 > Chronic ski make atrophic changes without acute abnormality [place] ABD Limited RUQ 6/4 > Gallbladder wall thickening and pericholecystic fluid may reflect acalculous cholecystitis. Heterogeneous hepatic echotexture is nonspecific. Normal appearing common bile duct. ECHO 6/4 > EF 25-30, severely reduced systolic function, PA pressure 56  CULTURES: Urine 6/3 >> MSSA Blood 6/3 >> Pneumococcal  Ag /603 >> positive Sputum 6/4 >> oral flora  ANTIBIOTICS: Rocephin 6/04 >>  Vancomycin 6/05 >> 6/06  SIGNIFICANT EVENTS: 6/03 presents to ED  6/08 PLT drop >> change to bivalirudin  LINES/TUBES: ETT 6/3 >> 6/08 CVC Left IJ 6/3 >> Left Axillary Aline 6/4 >> 6/06  DISCUSSION: 81 yo female with VDRF from Pneumococcal PNA, septic shock, acute combined CHF, A fib with RVR, COPD with emphysema.  She has reported hx of ILD/NSIP, but recent chest imaging and serology is not consistent with this.  She also has history of pulmonary hypertension.  ASSESSMENT / PLAN:  Acute on chronic hypoxic respiratory failure. Hx of COPD with emphysema. - extubate - f/u CXR - prn BDs  Pneumococcal pneumonia with septic shock. MSSA in urine culture. - day 5 of rocephin - wean pressors to keep MAP > 65 - goal CVP < 10  Acute combined CHF. Hx of CAD s/p CABG. A fib with RVR. - amiodarone per cardiology - continue lasix - hold lipitor, eliquis in setting of elevated LFTs - change to bivalirudin 6/08 in setting of low PLTs  Pulmonary hypertension. 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 - LFT's trending down - f/u LFTs  Anemia of critical illness and chronic disease. - f/u CBC  Thrombocytopenia. - f/u HIT, SRA from 6/08  Acute renal failure >> baseline creatinine 0.66 from 10/31/16. Hyokalemia - f/u BMET  Moderate protein calorie malnutrition. Dysphagia. - will need speech therapy assessment after extubation  Hyperglycemia. - SSI  DVT prophylaxis - heparin gtt SUP - protonix Nutrition - tube feeds Goals of care - DNR.  Palliative care consulted 6/07.    Spoke with pt's son, [name] over the phone.  Discussed plan.  He would like to defer to his mother to determine if she would want reintubation in the event of respiratory failure after extubation.  CC time 35 minutes  Chesley Mires,  MD [place] 11/23/2016, 9:21 AM Pager:  [phone] After 3pm call: [phone]

## 2016-11-23 NOTE — Progress Notes (Addendum)
ANTICOAGULATION CONSULT NOTE - Follow Up Consult  Pharmacy Consult for Bivalirudin  Indication: atrial fibrillation, r/o HIT  No Known Allergies  Patient Measurements: Height: 4' 11" (149.9 cm) Weight: 132 lb 8 oz (60.1 kg) IBW/kg (Calculated) : 43.2 Heparin Dosing Weight: 58 kg  Vital Signs: Temp: 97.8 F (36.6 C) (06/08 2314) Temp Source: Oral (06/08 2314) BP: 92/80 (06/08 2315) Pulse Rate: 110 (06/08 2315)  Labs:  Recent Labs  11/21/16 0326 11/21/16 1603 11/22/16 0322 11/23/16 0341 11/23/16 0342  11/23/16 0840 11/23/16 1358 11/23/16 1826 11/23/16 2228  HGB 10.0*  --  10.3* 10.6*  --   --   --   --   --   --   HCT 33.6*  --  33.7* 33.7*  --   --   --   --   --   --   PLT 143*  --  128* 70*  --   --   --   --   --   --   APTT  --   --   --   --   --   < > 191* 109* 55* 74*  LABPROT  --   --   --   --   --   --  24.9*  --   --   --   INR  --   --   --   --   --   --  2.21  --   --   --   HEPARINUNFRC 0.29*  --  0.37  --  0.31  --   --   --   --   --   CREATININE 1.37* 1.38* 1.38* 1.34*  --   --   --   --   --   --   < > = values in this interval not displayed.  Estimated Creatinine Clearance: 26.9 mL/min (A) (by C-G formula based on SCr of 1.34 mg/dL (H)).   Medications: Bivalirudin @ 0.05 mg/kghr  Assessment: 81 yo female with hx afib, not on chronic anticoagulation.  CHADSVASC at least 5.  Eliquis given 6/3 (1 dose).  Pharmacist requested to hold Eliquis given drastically elevated LFTs.  Started anticoagulation with IV heparin on 6/4.   Today platelet count dropped to 70, which is more than a 50% drop from baseline.  No specific suspected thrombosis, but HIT is certainly a consideration.  Discussed with Oda Kilts, PA and Dr. Aundra Dubin, will change to bivalirudin.  11/23/2016 11:35 PM Update: aPTT is therapeutic x 2   Goal of Therapy:  Goal aPTT 50-85 seconds Monitor platelets by anticoagulation protocol: Yes   Plan:  -Cont IV bivalirudin at 0.05  mg/kg/hr -Daily PTT, CBC. -F/u HIT antibody and SRA.  Narda Bonds, PharmD, BCPS Clinical Pharmacist Phone: [phone]

## 2016-11-23 NOTE — Procedures (Signed)
Extubation Procedure Note  Patient Details:   Name: Cristina Parker DOB: 1936-01-16 MRN: [id number]   Airway Documentation:     Evaluation  O2 sats: transiently fell during during procedure and currently acceptable Complications: No apparent complications Patient did tolerate procedure well. Bilateral Breath Sounds: Diminished, Clear   Yes   Pt extubated to 6L HFNC (home setting) per MD order. Pt stable currently, however, spo2 dropped to 78 post extubation. Pt in no distress, able to speak (whisper) post extubation and has a very weak non productive cough. Pt has PRN bipap orders if needed. RN aware. RT will continue to monitor.   Jesse Sans 11/23/2016, 10:23 AM

## 2016-11-23 NOTE — Progress Notes (Signed)
ANTICOAGULATION CONSULT NOTE - FOLLOW UP    aPTT = 55 (goal 50 - 85 sec) Heparin dosing weight = 58 kg   Assessment: 38 YOF with history of Afib on Eliquis PTA.  Patient was transitioned to IV heparin due to elevated LFTs, and then to bivalirudin due to thrombocytopenia.  HIT panel pending.  APTT therapeutic post rate adjustment.  No bleeding reported.   Plan: - Continue bivalirudin at 0.05 mg/kg/hr - Check confirmatory level in 4 hrs    Cristina Parker, PharmD, BCPS 11/23/2016, 7:27 PM

## 2016-11-23 NOTE — Progress Notes (Signed)
ANTICOAGULATION CONSULT NOTE - Follow Up Consult  Pharmacy Consult for IV heparin > change to bivalirudin Indication: atrial fibrillation, R/o HIT  No Known Allergies  Patient Measurements: Height: _0  (149.9 cm) Weight: 132 lb 8 oz (60.1 kg) IBW/kg (Calculated) : 43.2 Heparin Dosing Weight: 58 kg  Vital Signs: Temp: 98.5 F (36.9 C) (06/08 0400) Temp Source: Oral (06/08 0400) BP: 91/77 (06/08 0712) Pulse Rate: 95 (06/08 0712)  Labs:  Recent Labs  11/20/16 0951  11/20/16 1529  11/21/16 0326 11/21/16 1603 11/22/16 0322 11/23/16 0341 11/23/16 0342  HGB  --   --   --   < > 10.0*  --  10.3* 10.6*  --   HCT  --   --   --   --  33.6*  --  33.7* 33.7*  --   PLT  --   --   --   --  143*  --  128* 70*  --   APTT 153*  --  147*  --   --   --   --   --   --   HEPARINUNFRC  --   < > 0.42  --  0.29*  --  0.37  --  0.31  CREATININE  --   --   --   < > 1.37* 1.38* 1.38* 1.34*  --   < > = values in this interval not displayed.  Estimated Creatinine Clearance: 26.9 mL/min (A) (by C-G formula based on SCr of 1.34 mg/dL (H)).   Medications: Heparin @ 750 units/hr  Assessment: 81 yo female with hx afib, not on chronic anticoagulation.  CHADSVASC at least 5.  Eliquis given 6/3 (1 dose).  Pharmacist requested to hold Eliquis given drastically elevated LFTs.  Started anticoagulation with IV heparin on 6/4.   Today platelet count dropped to 70, which is more than a 50% drop from baseline.  No specific suspected thrombosis, but HIT is certainly a consideration.  Discussed with Oda Kilts, PA and Dr. Aundra Dubin, will change to bivalirudin.  Goal of Therapy:  Goal aPTT 50-85 seconds Monitor platelets by anticoagulation protocol: Yes   Plan:  -D/c IV heparin -Start bivalirudin 0.1 mg/kg/hr. -Check PTT 2 hrs after bivalirudin starts -Daily PTT, CBC. -F/u HIT antibody and SRA.  Uvaldo Rising, BCPS  Clinical Pharmacist Pager [phone]  11/23/2016 8:58 AM

## 2016-11-23 NOTE — Progress Notes (Signed)
Patient ID: Cristina Parker, female   DOB: 1936-06-13, 81 y.o.   MRN: [id number]   SUBJECTIVE:  Pt remains intubated but at times awake on vent. Unable to wean yesterday.   Awake on event. Slightly agitated.    CVP 8-9. Weight down 4 lbs. Negative 680 cc with 3 L of UO. LFTs continue to trend down.   Coox 53.2% on norepi at 14 and vasopressin 0.03.   Scheduled Meds: . chlorhexidine gluconate (MEDLINE KIT)  15 mL Mouth Rinse BID  . Chlorhexidine Gluconate Cloth  6 each Topical Daily  . digoxin  0.125 mg Oral Daily  . feeding supplement (VITAL HIGH PROTEIN)  1,000 mL Per Tube Q24H  . insulin aspart  0-15 Units Subcutaneous Q4H  . mouth rinse  15 mL Mouth Rinse 10 times per day  . pantoprazole sodium  40 mg Per Tube Q24H  . potassium chloride  40 mEq Per Tube Q4H  . sodium chloride flush  10-40 mL Intracatheter Q12H  . sodium chloride flush  3 mL Intravenous Q12H   Continuous Infusions: . sodium chloride 250 mL (11/20/16 1800)  . amiodarone 30 mg/hr (11/23/16 0600)  . cefTRIAXone (ROCEPHIN)  IV Stopped (11/22/16 1648)  . heparin 750 Units/hr (11/23/16 0500)  . norepinephrine (LEVOPHED) Adult infusion 14 mcg/min (11/23/16 0500)  . phenylephrine (NEO-SYNEPHRINE) Adult infusion Stopped (11/21/16 2080)  . vasopressin (PITRESSIN) infusion - *FOR SHOCK* 0.03 Units/min (11/23/16 0500)   PRN Meds:.fentaNYL (SUBLIMAZE) injection, ipratropium-albuterol, midazolam, ondansetron (ZOFRAN) IV, sennosides, sodium chloride flush    Vitals:   11/23/16 0400 11/23/16 0500 11/23/16 0600 11/23/16 0712  BP: 94/74 90/69 (!) 84/68 91/77  Pulse: (!) 105 (!) 111 100 95  Resp: (!) 24 (!) 24 (!) 24 (!) 24  Temp: 98.5 F (36.9 C)     TempSrc: Oral     SpO2: 98% 98% 98% 99%  Weight:      Height:        Intake/Output Summary (Last 24 hours) at 11/23/16 0717 Last data filed at 11/23/16 0600  Gross per 24 hour  Intake          2344.43 ml  Output             3025 ml  Net          -680.57 ml     LABS: Basic Metabolic Panel:  Recent Labs  11/21/16 0326 11/21/16 1603 11/22/16 0322 11/23/16 0341  NA 138 138 136 138  K 2.9* 2.8* 3.6 3.2*  CL 102 99* 97* 93*  CO2 _0 33*  GLUCOSE 158* 206* 158* 153*  BUN 35* 35* 35* 43*  CREATININE 1.37* 1.38* 1.38* 1.34*  CALCIUM 6.5* 6.7* 6.7* 7.1*  MG 1.9 2.1  --   --    Liver Function Tests:  Recent Labs  11/22/16 0322 11/23/16 0341  AST 1,104* 505*  ALT 1,463* 993*  ALKPHOS 208* 229*  BILITOT 7.1* 8.3*  PROT 4.5* 4.5*  ALBUMIN 2.3* 2.1*   No results for input(s): LIPASE, AMYLASE in the last 72 hours. CBC:  Recent Labs  11/22/16 0322 11/23/16 0341  WBC 20.4* 21.7*  HGB 10.3* 10.6*  HCT 33.7* 33.7*  MCV 93.1 91.1  PLT 128* 70*   Cardiac Enzymes: No results for input(s): CKTOTAL, CKMB, CKMBINDEX, TROPONINI in the last 72 hours. BNP: Invalid input(s): POCBNP D-Dimer: No results for input(s): DDIMER in the last 72 hours. Hemoglobin A1C: No results for input(s): HGBA1C in the last 72 hours. Fasting Lipid  Panel: No results for input(s): CHOL, HDL, LDLCALC, TRIG, CHOLHDL, LDLDIRECT in the last 72 hours. Thyroid Function Tests: No results for input(s): TSH, T4TOTAL, T3FREE, THYROIDAB in the last 72 hours.  Invalid input(s): FREET3 Anemia Panel: No results for input(s): VITAMINB12, FOLATE, FERRITIN, TIBC, IRON, RETICCTPCT in the last 72 hours.  RADIOLOGY: Ct Head Wo Contrast  Result Date: 11/24/2016 CLINICAL DATA:  Altered mental status EXAM: CT HEAD WITHOUT CONTRAST TECHNIQUE: Contiguous axial images were obtained from the base of the skull through the vertex without intravenous contrast. COMPARISON:  08/17/2013 FINDINGS: Brain: Diffuse atrophic changes are noted. Mild chronic white matter ischemic change is noted and stable. No findings to suggest acute hemorrhage, acute infarction or space-occupying mass lesion are noted. Mild basal ganglia calcifications are seen bilaterally. Vascular: No hyperdense vessel  or unexpected calcification. Skull: Normal. Negative for fracture or focal lesion. Sinuses/Orbits: No acute finding. Other: Nasogastric catheter is noted coiled within the posterior nasopharynx. IMPRESSION: Chronic ski make and atrophic changes without acute abnormality. Nasogastric catheter coiled within the posterior nasopharynx Electronically Signed   By: Inez Catalina M.D.   On: 11/20/2016 16:28   Dg Chest Port 1 View  Result Date: 11/22/2016 CLINICAL DATA:  Intubated patient, respiratory failure, septic shock, CHF. EXAM: PORTABLE CHEST 1 VIEW COMPARISON:  Portable chest x-ray of November 21, 2016 FINDINGS: The lungs are hypoinflated. Considerable volume loss on the right persists with obscuration of the hemidiaphragm. The left lung is better inflated. The interstitial markings of both lungs remain increased but have improved slightly. The cardiac silhouette remains enlarged. The pulmonary vascularity is less engorged. The endotracheal tube tip lies approximately 1.9 cm above the carina. The esophagogastric tube tip in proximal port project below the GE junction. The left internal jugular venous catheter tip projects at the cavoatrial junction. IMPRESSION: Slight interval improvement in pulmonary interstitial edema. Persistent interstitial prominence bilaterally. Small bilateral pleural effusions greatest on the right which may obscure atelectasis or pneumonia. Stable cardiomegaly with decreased pulmonary vascular congestion. The support tubes are in reasonable position. Electronically Signed   By: David  Martinique M.D.   On: 11/22/2016 07:28   Dg Chest Port 1 View  Result Date: 11/21/2016 CLINICAL DATA:  Intubated patient, respiratory failure EXAM: PORTABLE CHEST 1 VIEW COMPARISON:  Portable chest x-ray of November 20, 2016 FINDINGS: The lungs are reasonably well inflated but confluent density is present within the lower 2/3 of the right lung and hazy increased density is noted throughout the left lung. The  hemidiaphragms are obscured. The cardiac silhouette is enlarged. The patient has undergone previous median sternotomy. There is calcification in the wall of the aortic arch. The pulmonary vascularity is engorged. The endotracheal tube tip lies approximately 3.2 cm above the carina. The esophagogastric tube tip and proximal port project below the GE junction. The left internal jugular venous catheter tip projects over the distal third of the SVC. IMPRESSION: CHF with pulmonary interstitial and alveolar edema. Basilar and mid lung density on the right may reflect pneumonia. Bilateral pleural effusions are present. There has been little change since yesterday's study. The support devices are in reasonable position. Thoracic aortic atherosclerosis. Electronically Signed   By: David  Martinique M.D.   On: 11/21/2016 07:57   Dg Chest Port 1 View  Result Date: 11/20/2016 CLINICAL DATA:  ET tube, respiratory failure EXAM: PORTABLE CHEST 1 VIEW COMPARISON:  11/19/2016 FINDINGS: Endotracheal tube and left central line as well as NG tube remain in place, unchanged. Moderate bilateral layering effusions. Diffuse bilateral  airspace disease, likely edema. Mild cardiomegaly. IMPRESSION: Moderate layering bilateral pleural effusions with bilateral airspace disease, likely CHF. No real change. Electronically Signed   By: Rolm Baptise M.D.   On: 11/20/2016 07:35   Dg Chest Port 1 View  Result Date: 11/19/2016 CLINICAL DATA:  Hypoxia EXAM: PORTABLE CHEST 1 VIEW COMPARISON:  November 18, 2016 FINDINGS: Endotracheal tube tip is 1.2 cm above the carina. Nasogastric tube tip and side port are below the diaphragm. Central catheter tip is in the superior vena cava. No pneumothorax. There are pleural effusions bilaterally with underlying interstitial edema. There is atelectatic change in the lung bases. Heart is mildly enlarged with pulmonary vascularity within normal limits. There is aortic atherosclerosis. No adenopathy evident. There is  postoperative change in the lower cervical spine. IMPRESSION: Tube and catheter positions as described without pneumothorax. The endotracheal tube tip is near the carina; advise withdrawing endotracheal tube approximately 2 cm. Evidence a degree of underlying congestive heart failure. Bibasilar atelectasis. There is aortic atherosclerosis. Electronically Signed   By: Lowella Grip III M.D.   On: 11/19/2016 07:06   Dg Chest Portable 1 View  Result Date: 12/05/2016 CLINICAL DATA:  Central line placement EXAM: PORTABLE CHEST 1 VIEW COMPARISON:  11/17/2016 FINDINGS: Left central line tip is in the upper right atrium. No pneumothorax. Cardiomegaly. Bilateral layering effusions and bilateral airspace disease, likely edema/ CHF. NG tube and endotracheal tube are unchanged. Left central line tip in the upper right atrium. No pneumothorax. IMPRESSION: Continued bilateral airspace disease and layering effusions, likely edema/CHF. Electronically Signed   By: Rolm Baptise M.D.   On: 12/10/2016 16:51   Dg Chest Port 1 View  Result Date: 12/04/2016 CLINICAL DATA:  Patient status post intubation. EXAM: PORTABLE CHEST 1 VIEW COMPARISON:  Chest radiograph 11/30/2016 FINDINGS: ET tube terminates in the mid trachea. Enteric tube tip and side-port project over the stomach. Pacer apparatus overlies the left hemithorax. Patient status post median sternotomy. Low lung volumes. Persistent moderate right and small left pleural effusions with underlying pulmonary consolidation which appears worsened within the right greater than left mid and lower lungs. No pneumothorax. Osseous destruction right humeral head. IMPRESSION: ET tube terminates in the mid trachea. Worsening moderate right and small left pleural effusions with increasing bilateral underlying heterogeneous pulmonary opacities which may represent atelectasis or infection. Electronically Signed   By: Lovey Newcomer M.D.   On: 12/06/2016 14:17   Dg Chest Port 1  View  Result Date: 11/23/2016 CLINICAL DATA:  Shortness of breath for 1 day. EXAM: PORTABLE CHEST 1 VIEW COMPARISON:  06/20/2016 and prior exams FINDINGS: Cardiomegaly and CABG changes noted. A new moderate right pleural effusion noted with right lower lung atelectasis. A small left pleural effusion has enlarged with associated left basilar atelectasis. There is no evidence of pneumothorax. No acute bony abnormality noted. Severe right shoulder degenerative changes and cervical spine surgical changes noted. IMPRESSION: New moderate right pleural effusion and right lower lung atelectasis. Slightly increased small left pleural effusion with left basilar atelectasis. Cardiomegaly and CABG changes. Electronically Signed   By: Margarette Canada M.D.   On: 11/17/2016 08:27   US Abdomen Limited Ruq  Result Date: 11/19/2016 CLINICAL DATA:  Transaminitis EXAM: US ABDOMEN LIMITED - RIGHT UPPER QUADRANT COMPARISON:  CT scan the abdomen pelvis of January 01, 2014 FINDINGS: The study was done portably. The patient is intubated. Defibrillation pads limit the acoustical window. Gallbladder: The gallbladder is adequately distended. There is gallbladder wall thickening to 11.6 mm. There  is pericholecystic fluid. There is no positive sonographic Murphy's sign. No stones or sludge are evident. Common bile duct: Diameter: 3.8 mm Liver: The hepatic echotexture is heterogeneous. There is no focal mass nor ductal dilation. IMPRESSION: Gallbladder wall thickening and pericholecystic fluid may reflect acalculous cholecystitis. Heterogeneous hepatic echotexture is nonspecific. Normal appearing common bile duct. Electronically Signed   By: David  Martinique M.D.   On: 11/19/2016 07:05    PHYSICAL EXAM  General: Chronically ill appearing. Awake. Slightly agitated on vent.  HEENT: +ETT Neck: supple. JVP 8-9 Carotids 2+ bilat; no bruits. No thyromegaly or nodule noted. Cor: PMI nondisplaced. RRR, No M/G/R noted Lungs: + Rhonchi throughout with  mechanical breathing sounds.  Abdomen: Soft, non-tender, distended, no HSM. No bruits or masses. +BS  Extremities: No rash. Slightly cyanosis at toes. BLE 2+ into thighs.  Right hand blue discoloration.  Neuro: Awake. Nods to questioning.   TELEMETRY: Personally reviewed, Afib in 100s   ASSESSMENT AND PLAN: 81 yo with extensive history of PAD, CAD s/p CABG, interstitial lung disease, pulmonary hypertension is seen for shock and acute hypoxemic respiratory failure.   1. Shock: Suspect mixed cardiogenic and septic shock.  Echo this admission showed EF 25-30%, dyskinetic anteroseptal wall and apex, mild to moderately decreased RV systolic function, PASP 56 mmHg.  Prior echo in 5/17 with EF 50-55%.  She has Staph aureus in urine, suspect Staph bacteremia, ?PNA source.  Currently requiring norepinephrine and vasopressin, able to titrate off phenylephrine yesterday.   - Coox remains depressed despite pressor support.  - Continue ceftriaxone. S aureus from urine pan-sensitive.  - Given history of subclavian stenosis, we checked BP by cuff on thigh and it actually matched the axillary arterial line measurement pretty well. 2. Acute systolic CHF: New fall in EF noted on echo this admission, 25-30% compared to 50-55% in 5/17.  Anteroseptal wall is dyskinetic, fall in EF may not be acute.  Dr. Aundra Dubin is concerned that Mrs Beliz[place] has compromised flow to her LIMA (left subclavian appeared severely stenosed to occluded on prior doppler evaluation).   - CVP 8-9. Repeat 40 mg IV lasix x 1 and supp K.  - Coox depressed this am despite pressor support.  Not good candidate for inotrope with Afib with RVR.  3. Atrial fibrillation with RVR: New. HR down to 80s in atrial fibrillation.    - Continue heparin gtt for now.  - With low EF and shock, would avoid diltiazem gtt.  - Amiodarone use concerning with high LFTs, but LFTs are likely high due to shock liver. Trending down.   - Continue amiodarone.  - Continue  digoxin 0.125 mg daily. Follow levels.  4. Elevated LFTs: Hepatitis viral labs negative.  Abdominal US unremarkable.  Suspect shock liver in setting of hypotension. LFTs continue to trend down.  As above, would continue amiodarone for now. No change.  5. AKI:  - Creatinine stable currently in 1.3 range.  6. ID: Concern for PNA with septic shock.  Has Staph aureus in urine so may be triggering organism. WBCs and PCT high. Broad spectrum abx per CCM.  - WBC 21.7. Remains on ceftriaxone.  7. Pulmonary hypertension: She was on Opsumit in the past, stopped in 5/18 with concern for increased swelling.  No change.  8. Interstitial lung disease: Sees Dr. Chase Caller.  Possible NSIP. Attempting to wean from vent.  9. Subclavian stenosis: Bilateral, left subclavian stenosis severe on 2016 doppler evaluation.. ART line and cuff pressures accurate. Correlated with thigh cuff.  No change.  10. CAD: s/p CABG.  Holding statin with increased LFTs. Fall in EF between 5/17 and 6/18 is concerning.  With anteroseptal dyskinesis, Dr. Aundra Dubin is concerned that she may have lost her LIMA => at risk with history of severe left subclavian stenosis. Will need to consider further work up once/if stabilizes. No change.  11. Hypokalemia - 3.2 this am. Supp ordered.   12. Thrombocytopenia - 128 -> 70 overnight.  - Will switch to [place] and send HIT panel.  - Feet more cool and pulses diminished this am.  Continue to follow very closely.   Prognosis remains extremely guarded. Patient is critically ill and remains in imminent danger of multiple organ failure.    Will consult palliative team for goals of care.   Shirley Friar, PA-C  11/23/2016 7:17 AM   Advanced Heart Failure Team Pager [phone] (M-F; 7a - 4p)  Please contact [facility] for night-coverage after hours (4p -7a ) and weekends on [email or web address]  Patient seen with PA, agree with the above note.   LFTs trending down. Co-ox 53% which is marginal.  She is  on norepinephrine, will hold off on dobutamine with bradycardia.   CVP 8-9, got IV Lasix 40 x 1 this am to facilitate extubation. She was successfully extubated.   BP remains low, still on norepinephrine and vasopressin.  Ongoing treatment for septic/cardiogenic shock.   Plts have dropped, check HIT Ab and will use bivalirudin rather than heparin.   Continue abx.   Loralie Champagne 11/23/2016 12:53 PM

## 2016-11-23 NOTE — Consult Note (Signed)
Consultation Note Date: 11/23/2016   Patient Name: Cristina Parker  DOB: 03/23/1936  MRN: [id number]  Age / Sex: 81 y.o., female  PCP: Shawnee Knapp, MD Referring Physician: Juanito Doom, MD  Reason for Consultation: Establishing goals of care and Psychosocial/spiritual support  HPI/Patient Profile: 81 y.o. female  with past medical history of COPD, interstitial lung disease, atrial fib with RVR, coronary artery disease with CABG, diastolic heart failure, pulmonary hypertension admitted on 11/17/2016 with worsening shortness of breath, weight gain, worsening lower extremity edema. Plan was to  initially admit to stepdown unit and internal medicine when patient had what was thought to be a seizure in the emergency room. Her blood sugar at that time was in the 40s, and she was intubated and placed on the vent. She was hypotensive and started on pressors. Her last echo was in May 2017 with an ejection fraction of 50-55%. She currently uses 5-6 L of supplemental oxygen at all times at home. Her sats were 81% on 6 L upon presentation in the emergency room and she was in A. fib with RVR with a heart rate in the 150s. Her blood pressure dropped to the 70s and her Cardizem drip was stopped  Clinical Assessment and Goals of Care: Patient was extubated this morning. She still is somnolent but is able to answer simple questions requiring yes or no. Per chart review patient in the presence of her son, had a conversation with pulmonology stating that if she needed to be reintubated and placed on BiPAP she would want those things. When I went in to see patient by confirmed that she still did not want CPR or defibrillation and that she in fact didn't want to be reintubated. I asked her about if she was unable to get off the ventilator again ordered bleeding on her own and would she want to have a trach and a PEG. She stated at that time  she did not know.  I have left a message with her son Carlynn Spry, who is her healthcare power of attorney, to facilitate goals of care discussion  Her healthcare proxy would be her son Cici Rodriges [phone]. 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 This conversation was had by Dr. Halford Chessman with patient, and her son Patient needs goals of care discussion to include her son regarding long-term goals for example if she were unable to wean if reintubated would she want a trach and PEG Code Status/Advance Care Planning:   Limited code. No CPR, no defibrillation, but does agree to be reintubated and the use of BiPAP, usage of pressors    Symptom Management:   Dyspnea: Patient is extremely fragile and goals at this point would be to be reintubated if her breathing  status were to worsen; or usage of BiPAP. We'll continue to work with goals of care and if these were to change she would be a good candidate for opioids to relieve dyspnea  Palliative Prophylaxis:   Aspiration, Bowel Regimen, Delirium Protocol, Eye Care, Frequent Pain Assessment, Oral Care and Turn Reposition  Additional Recommendations (Limitations, Scope, Preferences):  Full Scope Treatment except for CPR and defibrillation  Psycho-social/Spiritual:   Desire for further Chaplaincy support:no  Additional Recommendations: ICU Family Guide  Prognosis:   Unable to determine  Discharge Planning: To Be Determined      Primary Diagnoses: Present on Admission: . Atrial fibrillation with RVR ([place]) . Acute on chronic respiratory failure ([place])   I have reviewed the medical record, interviewed the patient and family, and examined the patient. The  following aspects are pertinent.  Past Medical History:  Diagnosis Date  . Anemia   . Anxiety   . Arthritis   . Blood transfusion without reported diagnosis   . Cataract    BILATERAL  . Centrilobular emphysema ([place])   . Collagenous colitis   . Crohn's disease ([place])   . Depression   . Duodenal stricture   . Duodenal ulcer   . Esophageal reflux   . Heart disease   . Heart murmur   . Hiatal hernia   . Hyperlipemia   . Hypertension   . Osteoporosis   . Paroxysmal A-fib ([place]) 07/09/2011  . Peripheral vascular disease ([place])   . Pneumonia   . Pulmonary hypertension ([place])    Social History   Social History  . Marital status: Widowed    Spouse name: N/A  . Number of children: 2  . Years of education: N/A   Occupational History  . retired Therapist, sports    Social History Main Topics  . Smoking status: Former Smoker    Packs/day: 1.00    Years: 35.00    Types: Cigarettes    Quit date: 06/18/1996  . Smokeless tobacco: Never Used     Comment: Quit in 1998  . Alcohol use 4.2 - 8.4 oz/week    7 - 14 Standard drinks or equivalent per week     Comment: Occasional WINE  . Drug use: No  . Sexual activity: No   Other Topics Concern  . None   Social History Narrative   Retired Marine scientist;   Lives with son and grandson   Family History  Problem Relation Age of Onset  . Colon polyps Father   . Heart disease Father        Before age 41  . Stroke Father   . Deep vein thrombosis Father   . Vascular Disease Brother   . Alcoholism Paternal Grandfather   . Heart disease Paternal Grandmother   . Hypertension Mother   . Varicose Veins Mother   . Peripheral vascular disease Mother   . Osteoporosis Mother   . Heart disease Maternal Grandmother   . Colon cancer Neg Hx   . Esophageal cancer Neg Hx   . Rectal cancer Neg Hx   . Stomach cancer Neg Hx    Scheduled Meds: . chlorhexidine gluconate (MEDLINE KIT)  15 mL Mouth Rinse BID  . Chlorhexidine Gluconate Cloth  6 each Topical Daily  .  digoxin  0.125 mg Intravenous Daily  . insulin aspart  0-15 Units Subcutaneous Q4H  . mouth rinse  15 mL Mouth Rinse 10 times per day  . pantoprazole (PROTONIX) IV  40 mg Intravenous Q24H  . sodium chloride  flush  10-40 mL Intracatheter Q12H  . sodium chloride flush  3 mL Intravenous Q12H   Continuous Infusions: . sodium chloride 250 mL (11/20/16 1800)  . amiodarone 30 mg/hr (11/23/16 1500)  . bivalirudin (ANGIOMAX) infusion 0.5 mg/mL (Non-ACS indications)    . cefTRIAXone (ROCEPHIN)  IV Stopped (11/22/16 1648)  . norepinephrine (LEVOPHED) Adult infusion 18.987 mcg/min (11/23/16 1500)  . phenylephrine (NEO-SYNEPHRINE) Adult infusion Stopped (11/21/16 2500)  . vasopressin (PITRESSIN) infusion - *FOR SHOCK* 0.03 Units/min (11/23/16 1500)   PRN Meds:.ipratropium-albuterol, ondansetron (ZOFRAN) IV, sodium chloride flush Medications Prior to Admission:  Prior to Admission medications   Medication Sig Start Date End Date Taking? Authorizing Provider  Acetaminophen-Caffeine (EXCEDRIN TENSION HEADACHE) 500-65 MG TABS Take 1 tablet by mouth at bedtime as needed (pain/headache).   Yes [provider]  Ascorbic Acid (VITAMIN C) 1000 MG tablet Take 1,000 mg by mouth at bedtime.    Yes [provider]  atorvastatin (LIPITOR) 40 MG tablet Take 1 tablet (40 mg total) by mouth daily. 02/29/16  Yes Shawnee Knapp, MD  B Complex-C (SUPER B COMPLEX PO) Take 1 tablet by mouth daily.   Yes [provider]  BREO ELLIPTA 100-25 MCG/INH AEPB INHALE 1 PUFF INTO THE LUNGS DAILY 08/29/16  Yes Brand Males, MD  escitalopram (LEXAPRO) 20 MG tablet TAKE 1 TABLET(20 MG) BY MOUTH DAILY 10/12/16  Yes Shawnee Knapp, MD  feeding supplement, ENSURE ENLIVE, (ENSURE ENLIVE) LIQD Take 237 mLs by mouth 2 (two) times daily between meals. Patient taking differently: Take 237 mLs by mouth 3 (three) times daily between meals.  06/20/16  Yes Minus Liberty, MD  furosemide (LASIX) 20 MG tablet Take 1 tablet  daily on Monday, Wednesday, and Friday. Patient taking differently: Take 20 mg by mouth every Monday, Wednesday, and Friday.  11/13/16  Yes Tanda Rockers, MD  HYDROcodone-acetaminophen (NORCO) 10-325 MG tablet Take 1-2 tablets by mouth every 6 (six) hours as needed. Patient taking differently: Take 0.5 tablets by mouth every 8 (eight) hours as needed for severe pain.  10/01/16  Yes Shawnee Knapp, MD  LORazepam (ATIVAN) 0.5 MG tablet Take 0.5 tablets (0.25 mg total) by mouth 2 (two) times daily. Patient taking differently: Take 0.25 mg by mouth 2 (two) times daily as needed for anxiety.  10/01/16  Yes Shawnee Knapp, MD  metoprolol succinate (TOPROL-XL) 50 MG 24 hr tablet Take 1 tablet (50 mg total) by mouth daily. with or immediately following a meal. 02/29/16  Yes Shawnee Knapp, MD  Multiple Vitamin (MULTIVITAMIN WITH MINERALS) TABS tablet Take 1 tablet by mouth daily. Patient taking differently: Take 1 tablet by mouth at bedtime.  06/21/16  Yes Minus Liberty, MD  omeprazole (PRILOSEC) 20 MG capsule Take 20 mg by mouth daily.   Yes [provider]  OXYGEN Inhale 5.5-8 L into the lungs continuous.    Yes [provider]  potassium chloride (K-DUR) 10 MEQ tablet 1 tab daily on Monday, Wednesday, and Friday. Patient taking differently: Take 10 mEq by mouth every Monday, Wednesday, and Friday.  11/13/16  Yes Tanda Rockers, MD  Macitentan (OPSUMIT) 10 MG TABS Take 10 mg by mouth daily.    [provider]  nystatin (MYCOSTATIN) 100000 UNIT/ML suspension Take 5 mLs (500,000 Units total) by mouth 4 (four) times daily. Patient not taking: Reported on 12/12/2016 10/16/16   Shawnee Knapp, MD   No Known Allergies Review of Systems  Unable to perform ROS: Acuity of condition  Physical Exam  Constitutional:  Acutely ill appearing 81 year old female  HENT:  Head: Normocephalic and atraumatic.  Cardiovascular:  Tachycardic  Pulmonary/Chest:  Increased work of breathing at rest. Was  just extubated this morning  Neurological: She is alert.  Capable of answering only simple questions  Skin:  Cool with multiple ecchymotic areas. Her right hand is palpable as is her left foot  Psychiatric:  Unable to test  Nursing note and vitals reviewed.   Vital Signs: BP (!) 81/63   Pulse (!) 113   Temp 98.2 F (36.8 C) (Oral)   Resp (!) 23   Ht _0  (1.499 m)   Wt 60.1 kg (132 lb 8 oz)   SpO2 90%   BMI 26.76 kg/m  Pain Assessment: CPOT POSS *See Group Information*: S-Acceptable,Sleep, easy to arouse Pain Score: 0-No pain   SpO2: SpO2: 90 % O2 Device:SpO2: 90 % O2 Flow Rate: .O2 Flow Rate (L/min): 5 L/min  IO: Intake/output summary:  Intake/Output Summary (Last 24 hours) at 11/23/16 1610 Last data filed at 11/23/16 1515  Gross per 24 hour  Intake          2259.07 ml  Output             2345 ml  Net           -85.93 ml    LBM: Last BM Date:  (PTA) Baseline Weight: Weight: 55.8 kg (123 lb) (per chart record 5/18) Most recent weight: Weight: 60.1 kg (132 lb 8 oz)     Palliative Assessment/Data:   Flowsheet Rows     Most Recent Value  Intake Tab  Referral Department  Critical care  Unit at Time of Referral  ICU  Palliative Care Primary Diagnosis  Cardiac  Date Notified  11/22/16  Palliative Care Type  New Palliative care  Reason for referral  Clarify Goals of Care  Date of Admission  12/03/2016  Date first seen by Palliative Care  11/23/16  # of days Palliative referral response time  1 Day(s)  # of days IP prior to Palliative referral  4  Clinical Assessment  Palliative Performance Scale Score  30%  Pain Max last 24 hours  Not able to report  Pain Min Last 24 hours  Not able to report  Dyspnea Max Last 24 Hours  Not able to report  Dyspnea Min Last 24 hours  Not able to report  Nausea Max Last 24 Hours  Not able to report  Nausea Min Last 24 Hours  Not able to report  Anxiety Max Last 24 Hours  Not able to report  Anxiety Min Last 24 Hours  Not able  to report  Other Max Last 24 Hours  Not able to report  Psychosocial & Spiritual Assessment  Palliative Care Outcomes  Patient/Family meeting held?  Yes  Who was at the meeting?  pt  Palliative Care Outcomes  Clarified goals of care  Palliative Care follow-up planned  Yes, Facility      Time In: 1115 Time Out: 1210 Time Total: 55 min Greater than 50%  of this time was spent counseling and coordinating care related to the above assessment and plan. Staffed with bedside RN, and Dr. Halford Chessman. Have LM with pt's son Jaidynn Balster who is her HCPOA  Signed by: Dory Horn, NP   Please contact Palliative Medicine Team phone at [phone] for questions and concerns.  For individual provider: See Shea Evans

## 2016-11-23 NOTE — Progress Notes (Signed)
eLink Physician-Brief Progress Note Patient Name: Cristina Parker DOB: 1935-12-27 MRN: [id number]   Date of Service  11/23/2016  HPI/Events of Note  hypokalemia  eICU Interventions  Potassium replaced     Intervention Category Intermediate Interventions: Electrolyte abnormality - evaluation and management  Pistol Kessenich 11/23/2016, 4:36 AM

## 2016-11-23 NOTE — Progress Notes (Signed)
ANTICOAGULATION CONSULT NOTE - Follow Up Consult  Pharmacy Consult for IV bivalirudin Indication: atrial fibrillation, R/o HIT  No Known Allergies  Patient Measurements: Height: 4' 11" (149.9 cm) Weight: 132 lb 8 oz (60.1 kg) IBW/kg (Calculated) : 43.2 Heparin Dosing Weight: 58 kg  Vital Signs: Temp: 98.6 F (37 C) (06/08 1100) Temp Source: Oral (06/08 1100) BP: 88/76 (06/08 1300) Pulse Rate: 113 (06/08 1300)  Labs:  Recent Labs  11/20/16 1529  11/21/16 0326 11/21/16 1603 11/22/16 0322 11/23/16 0341 11/23/16 0342 11/23/16 0840 11/23/16 1358  HGB  --   < > 10.0*  --  10.3* 10.6*  --   --   --   HCT  --   --  33.6*  --  33.7* 33.7*  --   --   --   PLT  --   --  143*  --  128* 70*  --   --   --   APTT 147*  --   --   --   --   --   --  191* 109*  LABPROT  --   --   --   --   --   --   --  24.9*  --   INR  --   --   --   --   --   --   --  2.21  --   HEPARINUNFRC 0.42  --  0.29*  --  0.37  --  0.31  --   --   CREATININE  --   < > 1.37* 1.38* 1.38* 1.34*  --   --   --   < > = values in this interval not displayed.  Estimated Creatinine Clearance: 26.9 mL/min (A) (by C-G formula based on SCr of 1.34 mg/dL (H)).   Medications: Bivalirudin @ 0.1 mg/kghr  Assessment: 81 yo female with hx afib, not on chronic anticoagulation.  CHADSVASC at least 5.  Eliquis given 6/3 (1 dose).  Pharmacist requested to hold Eliquis given drastically elevated LFTs.  Started anticoagulation with IV heparin on 6/4.   Today platelet count dropped to 70, which is more than a 50% drop from baseline.  No specific suspected thrombosis, but HIT is certainly a consideration.  Discussed with Oda Kilts, PA and Dr. Aundra Dubin, will change to bivalirudin.  Initial PTT collection delayed, now available, and is supratherapeutic.  No bleeding or complications noted.  Goal of Therapy:  Goal aPTT 50-85 seconds Monitor platelets by anticoagulation protocol: Yes   Plan:  -Decrease IV bivalirudin to 0.05  mg/kg/hr -Check PTT 2 hrs after bivalirudin starts -Daily PTT, CBC. -F/u HIT antibody and SRA.  Uvaldo Rising, BCPS  Clinical Pharmacist Pager [phone]  11/23/2016 3:05 PM

## 2016-11-23 NOTE — Progress Notes (Signed)
Pharmacy Heparin Induced Thrombocytopenia (HIT) Note:  Cristina Parker is a 81 y.o. female being evaluated for HIT. Heparin was started 6/4 for afib, and baseline platelets were 177. HIT labs were ordered on 6/8 when platelets dropped to 70.   No results found for: HEPINDPLTAB, SRALOWDOSEHP, SRAHIGHDOSEH   Score = 0 Score = 1 Score = 2 Calculated Score  Thrombocytopenia -Platelet fall < 30% -Any platelet fall with nadir < 10 -Platelet fall >50% BUT surgery within 3 days  -Any combination of platelet fall/nadir that does not fit score 0 or 2 -Platelet fall >50% AND nadir ?20 AND no surgery within 3 days     1  Timing -Platelets fall on days ? 4  AND no prior heparin exposure in previous 100 days -Consistent w/ platelet fall on days 5-10 but missing counts -Platelet fall within 1 day of start AND prior exposure to heparin within previous 31-100 days -Platelet fall after day 10 -Platelet fall 5-10 days after starting heparin -Platelet fall within 1 day of heparin start AND prior exposure within previous 5-30 days 2  Thrombosis -None suspected -Recurrent thrombosis in patient receiving therapeutic anticoagulants -Suspected thrombosis (awaiting confirmation via imaging) -Erythematous skin lesions at heparin injection sites -Confirmed new thrombosis -Skin necrosis at injection site -Anaphylactoid reaction to heparin IV bolus -Adrenal hemorrhage 0-1  oTher Causes -Probable other cause (see protocol for full list) -Possible other cause evident (see protocol for full list) -No alternative explanation 1  Total Calculated 4T Score:     4-5   Name of MD Contacted: Loralie Champagne  Algorithm Recommendation: Possible HIT:  Order SRA.  Initiate alternative anticoagulant.  Document heparin allergy.  Plan: Spoke to Dr. Aundra Dubin, will d/c IV heparin and switch to bivalirudin given liver and kidney dysfunction.  Cristina Parker, BCPS  Clinical Pharmacist Pager [phone]  11/23/2016 7:34  AM

## 2016-11-23 NOTE — Progress Notes (Signed)
Spoke with pt after extubation.  Discussed options about how to proceed if her breathing gets worse.  She would be agreeable to Bipap and reintubation if needed.   Palliative care consulted and will continue this conversation.  Updated pt's son, [name], at bedside.  Chesley Mires, MD [facility] Pulmonary/Critical Care 11/23/2016, 10:52 AM Pager:  [phone] After 3pm call: [phone]

## 2016-11-23 NOTE — Progress Notes (Signed)
Palliative Medicine consult noted. Due to high referral volume, there may be a delay seeing this patient. Please call the Palliative Medicine Team office at [phone] if recommendations are needed in the interim.  Thank you for inviting [place] to see this patient.  Marjie Skiff Aaleeyah Bias, RN, BSN, [facility] [place] 11/23/2016 1:50 PM Cell [phone] 8:00-4:00 Monday-Friday Office [phone]

## 2016-11-24 ENCOUNTER — Inpatient Hospital Stay (HOSPITAL_COMMUNITY): Payer: Medicare Other

## 2016-11-24 DIAGNOSIS — Z515 Encounter for palliative care: Secondary | ICD-10-CM

## 2016-11-24 DIAGNOSIS — Z7189 Other specified counseling: Secondary | ICD-10-CM

## 2016-11-24 DIAGNOSIS — J9620 Acute and chronic respiratory failure, unspecified whether with hypoxia or hypercapnia: Secondary | ICD-10-CM

## 2016-11-24 LAB — COMPREHENSIVE METABOLIC PANEL
ALK PHOS: 240 U/L — AB (ref 38–126)
ALT: 721 U/L — ABNORMAL HIGH (ref 14–54)
ANION GAP: 14 (ref 5–15)
AST: 280 U/L — ABNORMAL HIGH (ref 15–41)
Albumin: 2.2 g/dL — ABNORMAL LOW (ref 3.5–5.0)
BILIRUBIN TOTAL: 10.5 mg/dL — AB (ref 0.3–1.2)
BUN: 52 mg/dL — ABNORMAL HIGH (ref 6–20)
CALCIUM: 8.1 mg/dL — AB (ref 8.9–10.3)
CO2: 33 mmol/L — ABNORMAL HIGH (ref 22–32)
Chloride: 93 mmol/L — ABNORMAL LOW (ref 101–111)
Creatinine, Ser: 1.34 mg/dL — ABNORMAL HIGH (ref 0.44–1.00)
GFR calc non Af Amer: 37 mL/min — ABNORMAL LOW (ref >60)
GFR, EST AFRICAN AMERICAN: 42 mL/min — AB (ref >60)
Glucose, Bld: 97 mg/dL (ref 65–99)
Potassium: 4 mmol/L (ref 3.5–5.1)
SODIUM: 140 mmol/L (ref 135–145)
TOTAL PROTEIN: 4.8 g/dL — AB (ref 6.5–8.1)

## 2016-11-24 LAB — COOXEMETRY PANEL
Carboxyhemoglobin: 1.8 % — ABNORMAL HIGH (ref 0.5–1.5)
METHEMOGLOBIN: 0.6 % (ref 0.0–1.5)
O2 Saturation: 65 %
Total hemoglobin: 10.8 g/dL — ABNORMAL LOW (ref 12.0–16.0)

## 2016-11-24 LAB — GLUCOSE, CAPILLARY
GLUCOSE-CAPILLARY: 93 mg/dL (ref 65–99)
GLUCOSE-CAPILLARY: 51 mg/dL — AB (ref 65–99)
Glucose-Capillary: 70 mg/dL (ref 65–99)
Glucose-Capillary: 92 mg/dL (ref 65–99)

## 2016-11-24 LAB — CBC
HEMATOCRIT: 33.8 % — AB (ref 36.0–46.0)
HEMOGLOBIN: 10.6 g/dL — AB (ref 12.0–15.0)
MCH: 28.4 pg (ref 26.0–34.0)
MCHC: 31.4 g/dL (ref 30.0–36.0)
MCV: 90.6 fL (ref 78.0–100.0)
Platelets: 71 10*3/uL — ABNORMAL LOW (ref 150–400)
RBC: 3.73 MIL/uL — ABNORMAL LOW (ref 3.87–5.11)
RDW: 16.8 % — ABNORMAL HIGH (ref 11.5–15.5)
WBC: 20.8 10*3/uL — ABNORMAL HIGH (ref 4.0–10.5)

## 2016-11-24 LAB — APTT: APTT: 73 s — AB (ref 24–36)

## 2016-11-24 LAB — HEPARIN INDUCED PLATELET AB (HIT ANTIBODY): HEPARIN INDUCED PLT AB: 0.29 {OD_unit} (ref 0.000–0.400)

## 2016-11-24 MED ORDER — LORAZEPAM 2 MG/ML IJ SOLN
0.5000 mg | Freq: Four times a day (QID) | INTRAMUSCULAR | Status: DC | PRN
  Administered 2016-11-24 (×3): 0.5 mg via INTRAVENOUS
  Filled 2016-11-24 (×4): qty 1

## 2016-11-24 MED ORDER — CHLORHEXIDINE GLUCONATE 0.12 % MT SOLN
15.0000 mL | Freq: Two times a day (BID) | OROMUCOSAL | Status: DC
  Administered 2016-11-24 – 2016-11-25 (×2): 15 mL via OROMUCOSAL
  Filled 2016-11-24 (×2): qty 15

## 2016-11-24 MED ORDER — DEXTROSE 50 % IV SOLN
INTRAVENOUS | Status: AC
  Filled 2016-11-24: qty 50

## 2016-11-24 MED ORDER — ORAL CARE MOUTH RINSE: 15.0000 mL | Freq: Two times a day (BID) | OROMUCOSAL | Status: DC

## 2016-11-24 MED ORDER — DEXTROSE-NACL 5-0.9 % IV SOLN
INTRAVENOUS | Status: DC
  Administered 2016-11-24: 21:00:00 via INTRAVENOUS

## 2016-11-24 MED ORDER — DEXTROSE 50 % IV SOLN
25.0000 mL | Freq: Once | INTRAVENOUS | Status: AC
Start: 2016-11-24 — End: 2016-11-24
  Administered 2016-11-24: 25 mL via INTRAVENOUS

## 2016-11-24 MED ORDER — SODIUM CHLORIDE 0.9 % IV SOLN: 250.0000 mL | INTRAVENOUS | Status: DC

## 2016-11-24 MED ORDER — FUROSEMIDE 10 MG/ML IJ SOLN
60.0000 mg | Freq: Once | INTRAMUSCULAR | Status: AC
  Administered 2016-11-24: 60 mg via INTRAVENOUS
  Filled 2016-11-24: qty 6

## 2016-11-24 MED ORDER — MAGIC MOUTHWASH W/LIDOCAINE
15.0000 mL | Freq: Four times a day (QID) | ORAL | Status: DC | PRN
  Administered 2016-11-24 – 2016-11-25 (×3): 15 mL via ORAL
  Filled 2016-11-24 (×5): qty 15

## 2016-11-24 NOTE — Progress Notes (Signed)
PULMONARY / CRITICAL CARE MEDICINE   Name: Cristina Parker MRN: [id number] DOB: 06/25/1935    ADMISSION DATE:  12/06/2016 CONSULTATION DATE:  12/06/2016  REFERRING MD:  Dr. Posey Pronto   CHIEF COMPLAINT:  Dyspnea   HPI: 81 yo female presented with dyspnea, edema, wt gain, hypoxia, wheezing.  She was in A fib with RVR.  She required intubation.  She has PMHx of COPD with emphysema, CAD s/p CABG, A fib, diastolic CHF, and pulmonary hypertension on macitentan as outpt.  SUBJECTIVE:  Extubated yesterday. Has some increased WOB with anxiety > given ativan Rt hand is still swollen  VITAL SIGNS: BP (!) 137/47   Pulse (!) 113   Temp 97.6 F (36.4 C) (Oral)   Resp (!) 22   Ht _0  (1.499 m)   Wt 136 lb 1.6 oz (61.7 kg)   SpO2 90%   BMI 27.49 kg/m   VENTILATOR SETTINGS:    INTAKE / OUTPUT: I/O last 3 completed shifts: In: 2660.3 [I.V.:1835.3; NG/GT:825] Out: 2765 [Urine:2765]  PHYSICAL EXAMINATION: Blood pressure (!) 137/47, pulse (!) 113, temperature 97.6 F (36.4 C), temperature source Oral, resp. rate (!) 22, height _1  (1.499 m), weight 136 lb 1.6 oz (61.7 kg), SpO2 90 %. Gen:      No acute distress HEENT:  EOMI, sclera anicteric Neck:     No masses; no thyromegaly Lungs:    Clear to auscultation bilaterally; normal respiratory effort CV:         Irreg; no murmurs Abd:      + bowel sounds; soft, non-tender; no palpable masses, no distension Ext:    1+ edema, Rt hand dusky; pulses doppler able Neuro: Awake, confused. Follows commands   LABS:  BMET  Recent Labs Lab 11/22/16 0322 11/23/16 0341 11/24/16 0600  NA 136 138 140  K 3.6 3.2* 4.0  CL 97* 93* 93*  CO2 29 33* 33*  BUN 35* 43* 52*  CREATININE 1.38* 1.34* 1.34*  GLUCOSE 158* 153* 97    Electrolytes  Recent Labs Lab 11/19/16 0452  11/20/16 0417 11/21/16 0326 11/21/16 1603 11/22/16 0322 11/23/16 0341 11/24/16 0600  CALCIUM 8.1*  < > 7.1* 6.5* 6.7* 6.7* 7.1* 8.1*  MG 2.0  < > 1.6* 1.9 2.1  --   --    --   PHOS 5.9*  --  4.2  --   --   --   --   --   < > = values in this interval not displayed.  CBC  Recent Labs Lab 11/22/16 0322 11/23/16 0341 11/24/16 0600  WBC 20.4* 21.7* 20.8*  HGB 10.3* 10.6* 10.6*  HCT 33.7* 33.7* 33.8*  PLT 128* 70* 71*    Coag's  Recent Labs Lab 11/29/2016 0755 11/19/16 0452  11/23/16 0840  11/23/16 1826 11/23/16 2228 11/24/16 0531  APTT  --  40*  < > 191*  < > 55* 74* 73*  INR 1.93 5.60*  --  2.21  --   --   --   --   < > = values in this interval not displayed.  Sepsis Markers  Recent Labs Lab 11/29/2016 1538 11/19/16 0452 11/20/16 0417  PROCALCITON 0.12 0.65 1.47    ABG  Recent Labs Lab 11/19/16 0352 11/19/16 1757 11/21/16 0355  PHART 7.338* 7.393 7.378  PCO2ART 41.8 41.2 43.3  PO2ART 107 68.0* 80.8*    Liver Enzymes  Recent Labs Lab 11/22/16 0322 11/23/16 0341 11/24/16 0600  AST 1,104* 505* 280*  ALT 1,463* 993* 721*  ALKPHOS 208* 229* 240*  BILITOT 7.1* 8.3* 10.5*  ALBUMIN 2.3* 2.1* 2.2*    Cardiac Enzymes  Recent Labs Lab 12/02/2016 1441 12/12/2016 1954  TROPONINI 0.90* 1.53*    Glucose  Recent Labs Lab 11/23/16 1119 11/23/16 1528 11/23/16 1913 11/23/16 2313 11/24/16 0253 11/24/16 0805  GLUCAP 188* 139* 111* 104* 93 70    Imaging Dg Chest Port 1 View  Result Date: 11/24/2016 CLINICAL DATA:  Respiratory failure EXAM: PORTABLE CHEST 1 VIEW COMPARISON:  November 23, 2016 FINDINGS: Central catheter tip is in superior vena cava. Nasogastric tube no longer apparent. There is a skin fold on the left but no evident pneumothorax. There are pleural effusions bilaterally. There is interstitial edema diffusely. There is airspace consolidation in the medial left base. Heart is mildly enlarged with pulmonary venous hypertension. Patient is status post internal mammary bypass grafting. Postoperative changes noted in the right neck as well as in the mid to lower cervical spine. There is advanced erosive arthropathy in  the right shoulder with remodeling of the humeral head with advanced avascular necrosis change. IMPRESSION: Congestive heart failure. Question alveolar edema versus superimposed pneumonia medial left base. Stable cardiac silhouette. There is aortic atherosclerosis. 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 There is an apparent skin fold on the left without evident pneumothorax. Overall appearance similar to 1 day prior. Electronically Signed   By: Lowella Grip III M.D.   On: 11/24/2016 07:35    STUDIES:  CT Head 6/3 > Chronic ski make atrophic changes without acute abnormality [place] ABD Limited RUQ 6/4 > Gallbladder wall thickening and pericholecystic fluid may reflect acalculous cholecystitis. Heterogeneous hepatic echotexture is nonspecific. Normal appearing common bile duct. ECHO 6/4 > EF 25-30, severely reduced systolic function, PA pressure 56  CULTURES: Urine 6/3 >> MSSA Blood 6/3 >> Pneumococcal Ag /603 >> positive Sputum 6/4 >> oral flora  ANTIBIOTICS: Rocephin 6/04 >>  Vancomycin 6/05 >> 6/06  SIGNIFICANT EVENTS: 6/03 presents to ED  6/08 PLT drop >> change to bivalirudin  LINES/TUBES: ETT 6/3 >> 6/08 CVC Left IJ 6/3 >> Left Axillary Aline 6/4 >> 6/06  DISCUSSION: 81 yo female with VDRF from Pneumococcal PNA, septic shock, acute combined CHF, A fib with RVR, COPD with emphysema.  She has reported hx of ILD/NSIP, but recent chest imaging and serology is not consistent with this.  She also has history of pulmonary hypertension.  ASSESSMENT / PLAN:  Acute on chronic hypoxic respiratory failure. Hx of COPD with emphysema. - Monitor resp status - Use bipap as needed - Pt if ok with temporary reintubation  Pneumococcal pneumonia with septic shock. MSSA in urine culture. - day 6 of rocephin - Off vaso. Weaning levo down - Follow CVP  Acute combined CHF. Hx of CAD s/p  CABG. A fib with RVR. - Amiodarone gtt per cardiology - Continue lasix - On bivalirudin. anticoagulation   Pulmonary hypertension. - most likely WHO group 2 and 3 - Off macitentan due to elevated LFTsd/c'ed in setting of increased LFTs  Elevated LFTs related to shock, medications. - Follow LFTs. Improving  Anemia of critical illness and chronic disease. - Follow CBC  Thrombocytopenia. - Check HIT ab and SRA  Acute renal failure >> baseline creatinine 0.66 from 10/31/16. Hyokalemia - Monitor urine output and Cr  Moderate protein calorie malnutrition. Dysphagia. - Speech therapy, swallow eval  Hyperglycemia. - SSi coverage  DVT prophylaxis - bivalirudin gtt SUP -  protonix Nutrition - Keep NPO Goals of care - DNR. OK for reintubation  Palliative care on board  The patient is critically ill with multiple organ system failure and requires high complexity decision making for assessment and support, frequent evaluation and titration of therapies, advanced monitoring, review of radiographic studies and interpretation of complex data.   Critical Care Time devoted to patient care services, exclusive of separately billable procedures, described in this note is 35 minutes.   Marshell Garfinkel MD [facility] Pager [phone] If no answer or after 3pm call: [phone] 11/24/2016, 12:02 PM

## 2016-11-24 NOTE — Progress Notes (Signed)
Daily Progress Note   Patient Name: Cristina Parker       Date: 11/24/2016 DOB: 1936-01-07  Age: 81 y.o. MRN#: [id number] Attending Physician: Juanito Doom, MD Primary Care Physician: Shawnee Knapp, MD Admit Date: 12/04/2016  Reason for Consultation/Follow-up: Establishing goals of care and Psychosocial/spiritual support  Subjective: Cristina Parker is feeling poor this morning. She is very upset about the swelling and discoloration of her right hand, as well as her overall debility and fatigue. She does feel that she is breathing better, and more awake and alert compared to yesterday. Multiple family member at her bedside, including her son, her sister Alma Friendly, who is her 4), a friend, and a grandson. Their concerns mirror hers: her progressive discoloration and coolness of her extremities.  Length of Stay: 6  Current Medications: Scheduled Meds:  . chlorhexidine  15 mL Mouth Rinse BID  . digoxin  0.125 mg Intravenous Daily  . insulin aspart  0-15 Units Subcutaneous Q4H  . mouth rinse  15 mL Mouth Rinse q12n4p  . pantoprazole (PROTONIX) IV  40 mg Intravenous Q24H  . sodium chloride flush  10-40 mL Intracatheter Q12H  . sodium chloride flush  3 mL Intravenous Q12H    Continuous Infusions: . sodium chloride 250 mL (11/20/16 1800)  . amiodarone 30 mg/hr (11/24/16 0815)  . bivalirudin (ANGIOMAX) infusion 0.5 mg/mL (Non-ACS indications) 0.05 mg/kg/hr (11/24/16 0700)  . cefTRIAXone (ROCEPHIN)  IV 1 g (11/23/16 1622)  . norepinephrine (LEVOPHED) Adult infusion 13.013 mcg/min (11/24/16 0700)  . phenylephrine (NEO-SYNEPHRINE) Adult infusion Stopped (11/21/16 8367)  . vasopressin (PITRESSIN) infusion - *FOR SHOCK* 0.03 Units/min (11/24/16 0700)    PRN Meds: ipratropium-albuterol, ondansetron (ZOFRAN) IV, sodium chloride flush  Physical  Exam  Constitutional: She is oriented to person, place, and time. She appears distressed (mildly anxious, improves with gentle conversation).  Acutely ill 81yo.  HENT:  Head: Normocephalic and atraumatic.  Mouth/Throat: Abnormal dentition.  Pt coughing frank blood x1 episode. Dried blood noted around lips. Dry mucous membranes.  Eyes: EOM are normal.  Neck: Neck supple.  Central line in neck limits ROM  Cardiovascular: Tachycardia present.   Pulmonary/Chest: Accessory muscle usage present. Tachypnea noted. She has wheezes.  Poor air movement. Short of breath at rest, able to speak a few words then requires a breathing break  Abdominal: Soft. Bowel sounds are normal.  Musculoskeletal: She exhibits edema.  Generalized weakness. Able to move all extremities. Poor dexterity.   Neurological: She is alert and oriented to person, place, and time.  Skin: Skin is warm and dry. There is pallor.  Significant purple coloration on entirety of right hand to wrist. Hand cool and swollen. Purpling of finger tips on left hand and on the soles of both feet. Right foot cool. Purple on left ear. Skin pale with slight yellow tint. Multiple areas of bruising.  Psychiatric: She has a normal mood and affect. Judgment and thought content normal. Her speech is delayed. She is slowed. Cognition and memory are normal.           Vital Signs: BP 91/74   Pulse (!) 115   Temp 97.6 F (36.4 C) (Oral)   Resp (!) 24  Ht _0  (1.499 m)   Wt 61.7 kg (136 lb 1.6 oz)   SpO2 90%   BMI 27.49 kg/m  SpO2: SpO2: 90 % O2 Device: O2 Device: High Flow Nasal Cannula O2 Flow Rate: O2 Flow Rate (L/min): 15 L/min  Intake/output summary:  Intake/Output Summary (Last 24 hours) at 11/24/16 0930 Last data filed at 11/24/16 0900  Gross per 24 hour  Intake          1501.04 ml  Output             1380 ml  Net           121.04 ml   LBM: Last BM Date: 11/24/16 Baseline Weight: Weight: 55.8 kg (123 lb) (per chart record  5/18) Most recent weight: Weight: 61.7 kg (136 lb 1.6 oz)  Palliative Assessment/Data: PPS 10%   Flowsheet Rows     Most Recent Value  Intake Tab  Referral Department  Critical care  Unit at Time of Referral  ICU  Palliative Care Primary Diagnosis  Cardiac  Date Notified  11/22/16  Palliative Care Type  New Palliative care  Reason for referral  Clarify Goals of Care  Date of Admission  11/23/2016  Date first seen by Palliative Care  11/23/16  # of days Palliative referral response time  1 Day(s)  # of days IP prior to Palliative referral  4  Clinical Assessment  Palliative Performance Scale Score  30%  Pain Max last 24 hours  Not able to report  Pain Min Last 24 hours  Not able to report  Dyspnea Max Last 24 Hours  Not able to report  Dyspnea Min Last 24 hours  Not able to report  Nausea Max Last 24 Hours  Not able to report  Nausea Min Last 24 Hours  Not able to report  Anxiety Max Last 24 Hours  Not able to report  Anxiety Min Last 24 Hours  Not able to report  Other Max Last 24 Hours  Not able to report  Psychosocial & Spiritual Assessment  Palliative Care Outcomes  Patient/Family meeting held?  Yes  Who was at the meeting?  pt  Palliative Care Outcomes  Clarified goals of care  Palliative Care follow-up planned  Yes, Facility      Patient Active Problem List   Diagnosis Date Noted  . Palliative care by specialist   . Transaminitis   . Septic shock ([place])   . Acute systolic CHF (congestive heart failure) ([place])   . Atrial fibrillation with RVR ([place]) 12/01/2016  . Acute on chronic respiratory failure ([place]) 11/28/2016  . Demand ischemia ([place])   . Coronary artery disease involving native coronary artery of native heart without angina pectoris   . Pressure injury of skin 06/20/2016  . Sepsis due to Escherichia coli ([place])   . Bacteremia due to Escherichia coli   . Community acquired pneumonia of right upper lobe of lung ([place])   . CAP (community acquired pneumonia)  06/19/2016  . Centrilobular emphysema ([place]) 03/27/2016  . Pulmonary scarring 03/27/2016  . Physical deconditioning 03/27/2016  . Pulmonary hypertension ([place]) 12/23/2015  . Hypoxemia 12/23/2015  . Pulmonary hyperinflation 11/10/2015  . Dyspnea 11/10/2015  . ILD (interstitial lung disease) ([place]) 09/15/2015  . Vitamin D deficiency 06/03/2015  . Acute on chronic respiratory failure with hypoxia ([place]) 03/16/2015  . 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 Chronic pain syndrome 11/04/2014  . Carotid stenosis 04/06/2014  . Aftercare following surgery of the circulatory system  04/06/2014  . Cataract 09/25/2012  . Arthritis 09/25/2012  . Depression 09/25/2012  . Osteoporosis 09/25/2012  . Duodenal stricture 09/16/2012  . Murmur 12/19/2011  . Occlusion and stenosis of carotid artery without mention of cerebral infarction 11/15/2011  . Paroxysmal a-fib ([place]) 07/09/2011  . COPD exacerbation ([place]) 07/03/2011  . Peripheral vascular disease ([place]) 10/31/2007  . HIATAL HERNIA 10/31/2007  . Other and unspecified hyperlipidemia 07/18/2007  . Essential hypertension 07/18/2007  . Coronary atherosclerosis 07/18/2007  . VENTRICULAR ARRHYTHMIA 07/18/2007  . LUNG NODULE 07/18/2007  . G E R D 07/18/2007  . CAROTID ENDARTERECTOMY, LEFT, HX OF 07/18/2007    Palliative Care Assessment & Plan   HPI: 81 y.o. female  with past medical history of COPD, interstitial lung disease, atrial fib with RVR, coronary artery disease with CABG, diastolic heart failure, pulmonary hypertension admitted on 12/08/2016 with worsening shortness of breath, weight gain, worsening lower extremity edema. Plan was to  initially admit to stepdown unit and internal medicine when patient had what was thought to be a seizure in the emergency room. Her blood sugar at that time was in the 40s, and she was intubated and placed on the vent. She was hypotensive and started on pressors. Her last echo was in May 2017 with an  ejection fraction of 50-55%. She currently uses 5-6 L of supplemental oxygen at all times at home. Her sats were 81% on 6 L upon presentation in the emergency room and she was in A. fib with RVR with a heart rate in the 150s. Her blood pressure dropped to the 70s and her Cardizem drip was stopped. Work-up revealed pneumococcal pneumonia with septic shock and MSSA in urine culture. Palliative consulted to assist in clarifying goals of care in this critically ill woman.   Assessment: My partner Romona Curls (NP), met with Cristina Parker yesterday afternoon after extubation. In their conversation she confirmed the patients wishes for re-intubation if warranted, however no CPR or defibrillation. NP Bullard asked about her wishes in the event she could not wean off the ventilator, and she asked to think on it further.   Today, I followed up to revisit her goals of care and clarify her wishes. Cristina Parker was much more awake, alert, and engaging this morning. She had multiple family members at her bedside, including her son Aaron Edelman and her sister Alma Friendly (who she reports is her HCPOA). Cristina Parker asked for her family to step out, and we discussed her wishes. She would like to be re-intubated if needed, but would like it to be for a 5 day trial period. If she does not improve to the point she can get off the ventilator by day five, she would want to transition to comfort care with medication and one way extubation. She does NOT want a trach or peg. After our discussion we then brought Aaron Edelman and Alma Friendly back into the room and she was able to share her wishes with them as well.   Finally, I did speak with Cristina Parker and her family about her progress. I explained there are many ways to evaluate improvement, and we talked through some of the metrics that had improved for her, but also some of the reasons she still required ICU level care. I also explained that improvement would be very slow, and her recovery would be  prolonged due to her chronic health issues and debility. In this conversation I also emphasized her fragile health, and the possibility that she may decline again. They were  receptive to this information and appreciative of the explanations.  Recommendations/Plan:  Partial code:   No CPR, no defibrillation  Yes BiPAP, yes pressors, yes intubation  Intubate for 5 day trial period, if she is not off the vent by day 5, she would want comfort measures and one way extubation. No trach. No PEG.   I would encourage vascular consult to evaluate right hand; family and pt very distressed by it  Code Status:  Limited code  Prognosis:   Unable to determine  Discharge Planning:  To Be Determined  Care plan was discussed with pt, son, sister Chauncey Reading), care nurse.  Thank you for allowing the Palliative Medicine Team to assist in the care of this patient.  Time in: 0900 Time out: 0950 Total time: 70 minutes    Greater than 50%  of this time was spent counseling and coordinating care related to the above assessment and plan.  Charlynn Court, NP Palliative Medicine Team [phone] pager (7a-5p) Team Phone # [phone]

## 2016-11-24 NOTE — Progress Notes (Addendum)
Hypoglycemic Event  CBG: 51  Treatment: D50 IV 25 mL  Symptoms: None  Follow-up CBG: Time: 1215 CBG Result: 112  Possible Reasons for Event: npo  Comments/MD notified: Dr. Rozann Lesches, Ardeth Sportsman

## 2016-11-24 NOTE — Progress Notes (Signed)
Pt with increased wob, however pt's family states she is at or near baseline. Pt states she is not usually this sob. Pt given ativan to see if it would help anxiety. RT placed bipap at bedside to try if needed. Pt in agreement. RN aware. RT will continue to monitor for need of bipap.

## 2016-11-24 NOTE — Progress Notes (Signed)
eLink Physician-Brief Progress Note Patient Name: Cristina Parker DOB: 1936/02/04 MRN: [id number]   Date of Service  11/24/2016  HPI/Events of Note  Recurrent hypoglcemia.  NPO  eICU Interventions  D5NS at 50 cc/hr ordered     Intervention Category Intermediate Interventions: Other:  Mauri [place], Mamie Nick 11/24/2016, 8:32 PM

## 2016-11-24 NOTE — Evaluation (Signed)
Clinical/Bedside Swallow Evaluation Patient Details  Name: Cristina Parker MRN: [id number] Date of Birth: April 20, 1936  Today's Date: 11/24/2016 Time: SLP Start Time (ACUTE ONLY): 92 SLP Stop Time (ACUTE ONLY): 1530 SLP Time Calculation (min) (ACUTE ONLY): 23 min  Past Medical History:  Past Medical History:  Diagnosis Date  . Anemia   . Anxiety   . Arthritis   . Blood transfusion without reported diagnosis   . Cataract    BILATERAL  . Centrilobular emphysema ([place])   . Collagenous colitis   . Crohn's disease ([place])   . Depression   . Duodenal stricture   . Duodenal ulcer   . Esophageal reflux   . Heart disease   . Heart murmur   . Hiatal hernia   . Hyperlipemia   . Hypertension   . Osteoporosis   . Paroxysmal A-fib ([place]) 07/09/2011  . Peripheral vascular disease ([place])   . Pneumonia   . Pulmonary hypertension ([place])    Past Surgical History:  Past Surgical History:  Procedure Laterality Date  . ABDOMINAL HYSTERECTOMY    . 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 BUNIONECTOMY  1977   both feet   . CARDIAC CATHETERIZATION N/A 12/08/2015   Procedure: Right Heart Cath;  Surgeon: Larey Dresser, MD;  Location: [facility];  Service: Cardiovascular;  Laterality: N/A;  . CAROTID ENDARTERECTOMY  05-1998   Left CEA  . CAROTID ENDARTERECTOMY  04/11/11   Right CEA  . CERVICAL DISC SURGERY    . CESAREAN SECTION    . COLONOSCOPY    . CORONARY ARTERY BYPASS GRAFT    . EYE SURGERY  10/2010   left eye cataract  . FRACTURE SURGERY  1998   ORIF  . HIP SURGERY     right  . TUBAL LIGATION     HPI:  81 y.o.femalewith past medical history of PVD, GERD, hiatal hernia, COPD, interstitial lung disease, atrial fib with RVR, coronary artery disease with CABG, diastolic heart failure, pulmonary hypertensionadmitted on 6/3/2018with worsening shortness of breath, weight gain, worsening lower extremity edema. She was intubated 6/3-6/8. Work-up  revealed pneumococcal pneumonia with septic shock and MSSA in urine culture. Palliative medicine following, pt is partial code, would want re-intubation for 5 day trial period; if unable to wean from vent prefers comfort medications, one-way extubation. Does not want trach or PEG. No prior swallowing evaluations in chart.   Assessment / Plan / Recommendation Clinical Impression  Patient presents with severe risk for aspiration in the setting of recent 6 day intubation, decreased respiratory status requiring biPAP intermittently, deconditioning. Pt also has a history of GERD and hiatal hernia. She initially declines oral care, however requesting [employer] Magic Mouthwash, which RN administered and suctioned orally. SLP followed up by swabbing oral cavity. Tongue noted for anterior lesion. Her respiratory rate at baseline is in mid-upper 20s, O2 94-98 on HFNC. Tolerates ice chips with no overt signs of aspiration, respiratory distress or change in vital signs. With teaspoons, single cup and single straw sips of water, intermittent wet vocal quality, elevation of RR to 32. Given pt's risk factors for aspiration, recommend instrumental assessment prior to initiating diet. Discussed plan with pt/RN; will f/u tomorrow to assess appropriateness for MBS depending on respiratory status, possibly FEES Monday should pt not be medically able to transport to [place] for MBS tomorrow. Recommend continuing NPO pending instrumental assessment; pt may have ice chips PRN after oral care. SLP Visit Diagnosis: Dysphagia,  unspecified (R13.10)    Aspiration Risk  Severe aspiration risk    Diet Recommendation Ice chips PRN after oral care;NPO   Liquid Administration via: Spoon Medication Administration: Via alternative means Supervision: Full supervision/cueing for compensatory strategies Compensations: Slow rate;Small sips/bites Postural Changes: Seated upright at 90 degrees    Other  Recommendations Oral Care  Recommendations: Oral care QID;Oral care prior to ice chip/H20 Other Recommendations: Remove water pitcher;Have oral suction available   Follow up Recommendations Other (comment) (TBD)      Frequency and Duration min 2x/week  2 weeks       Prognosis Prognosis for Safe Diet Advancement: Good Barriers to Reach Goals: Other (Comment) (Comorbidities)      Swallow Study   General Date of Onset: 12/04/2016 HPI: 81 y.o.femalewith past medical history of PVD, GERD, hiatal hernia, COPD, interstitial lung disease, atrial fib with RVR, coronary artery disease with CABG, diastolic heart failure, pulmonary hypertensionadmitted on 6/3/2018with worsening shortness of breath, weight gain, worsening lower extremity edema. She was intubated 6/3-6/8. Work-up revealed pneumococcal pneumonia with septic shock and MSSA in urine culture. Palliative medicine following, pt is partial code, would want re-intubation for 5 day trial period; if unable to wean from vent prefers comfort medications, one-way extubation. Does not want trach or PEG. No prior swallowing evaluations in chart. Type of Study: Bedside Swallow Evaluation Previous Swallow Assessment: none in chart Diet Prior to this Study: NPO Temperature Spikes Noted: No Respiratory Status: Nasal cannula;Other (comment) (Hi-Flow, has been on and off BiPAP) History of Recent Intubation: Yes Length of Intubations (days): 6 days Date extubated: 11/23/16 Behavior/Cognition: Alert;Cooperative Oral Cavity Assessment: Lesions (tongue lesion) Oral Care Completed by SLP: Yes Oral Cavity - Dentition: Missing dentition Vision: Functional for self-feeding Self-Feeding Abilities: Needs assist;Needs set up Patient Positioning: Upright in bed Baseline Vocal Quality: Hoarse Volitional Cough: Congested Volitional Swallow: Able to elicit    Oral/Motor/Sensory Function Overall Oral Motor/Sensory Function: Within functional limits   Ice Chips Ice chips: Within  functional limits Presentation: Spoon   Thin Liquid Thin Liquid: Impaired Presentation: Cup;Spoon;Straw Pharyngeal  Phase Impairments: Suspected delayed Swallow;Wet Vocal Quality;Change in Vital Signs    Nectar Thick Nectar Thick Liquid: Not tested   Honey Thick Honey Thick Liquid: Not tested   Puree Puree: Not tested   Solid   GO   Solid: Not tested        Aliene Altes 11/24/2016,4:05 PM  Deneise Lever, [place], [place] Pathologist [phone]

## 2016-11-24 NOTE — Progress Notes (Signed)
ANTICOAGULATION CONSULT NOTE - Follow Up Consult  Pharmacy Consult for Bivalirudin  Indication: atrial fibrillation, r/o HIT  No Known Allergies  Patient Measurements: Height: _0  (149.9 cm) Weight: 136 lb 1.6 oz (61.7 kg) IBW/kg (Calculated) : 43.2 Heparin Dosing Weight: 58 kg  Vital Signs: Temp: 98 F (36.7 C) (06/09 0255) Temp Source: Oral (06/09 0255) BP: 81/55 (06/09 0727) Pulse Rate: 105 (06/09 0727)  Labs:  Recent Labs  11/22/16 0322 11/23/16 0341 11/23/16 0342 11/23/16 0840  11/23/16 1826 11/23/16 2228 11/24/16 0531 11/24/16 0600  HGB 10.3* 10.6*  --   --   --   --   --   --  10.6*  HCT 33.7* 33.7*  --   --   --   --   --   --  33.8*  PLT 128* 70*  --   --   --   --   --   --  71*  APTT  --   --   --  191*  < > 55* 74* 73*  --   LABPROT  --   --   --  24.9*  --   --   --   --   --   INR  --   --   --  2.21  --   --   --   --   --   HEPARINUNFRC 0.37  --  0.31  --   --   --   --   --   --   CREATININE 1.38* 1.34*  --   --   --   --   --   --  1.34*  < > = values in this interval not displayed.  Estimated Creatinine Clearance: 27.2 mL/min (A) (by C-G formula based on SCr of 1.34 mg/dL (H)).   Medications: Bivalirudin @ 0.05 mg/kghr  Assessment: 81 yo female with hx afib, not on chronic anticoagulation.  CHADSVASC at least 5.  Eliquis given 6/3 (1 dose).  Pharmacist requested to hold Eliquis given drastically elevated LFTs.  Started anticoagulation with IV heparin on 6/4.   Today platelet count stable at 71, hgb stable.  No specific suspected thrombosis, but HIT is certainly a consideration. Aptt stable at 73. No bleeding issues noted  Goal of Therapy:  Goal aPTT 50-85 seconds Monitor platelets by anticoagulation protocol: Yes   Plan:  -Cont IV bivalirudin at 0.05 mg/kg/hr -Daily PTT, CBC. -F/u HIT antibody and SRA.  Erin Hearing PharmD., BCPS Clinical Pharmacist Pager [phone] 11/24/2016 7:58 AM

## 2016-11-24 NOTE — Progress Notes (Signed)
Patient ID: Cristina Parker, female   DOB: [birth year], 80 y.o.   MRN: [id number]   SUBJECTIVE:  Extubated yesterday.  Currently, oxygen saturation 89% and mildly tachypneic.  CVP 9, co-ox 65%.  BP higher today.   She remains on norepinephrine at 10 and vasopressin 0.03.   She remains in atrial fibrillation on amiodarone gtt.   Plts stable at 71K today.  She is now on bivalirudin.   Right hand remains dark, not painful.   Scheduled Meds: . chlorhexidine  15 mL Mouth Rinse BID  . digoxin  0.125 mg Intravenous Daily  . furosemide  60 mg Intravenous Once  . insulin aspart  0-15 Units Subcutaneous Q4H  . mouth rinse  15 mL Mouth Rinse q12n4p  . pantoprazole (PROTONIX) IV  40 mg Intravenous Q24H  . sodium chloride flush  10-40 mL Intracatheter Q12H  . sodium chloride flush  3 mL Intravenous Q12H   Continuous Infusions: . sodium chloride 250 mL (11/20/16 1800)  . amiodarone 30 mg/hr (11/24/16 0815)  . bivalirudin (ANGIOMAX) infusion 0.5 mg/mL (Non-ACS indications) 0.05 mg/kg/hr (11/24/16 0700)  . cefTRIAXone (ROCEPHIN)  IV 1 g (11/23/16 1622)  . norepinephrine (LEVOPHED) Adult infusion 10 mcg/min (11/24/16 0943)  . phenylephrine (NEO-SYNEPHRINE) Adult infusion Stopped (11/21/16 6144)   PRN Meds:.ipratropium-albuterol, ondansetron (ZOFRAN) IV, sodium chloride flush    Vitals:   11/24/16 0800 11/24/16 0815 11/24/16 0830 11/24/16 0843  BP: 98/63 104/86 91/74   Pulse: (!) 102 (!) 115    Resp: 18 (!) 23 (!) 24   Temp: 97.6 F (36.4 C)     TempSrc: Oral     SpO2: (!) 88% (!) 85%  90%  Weight:      Height:        Intake/Output Summary (Last 24 hours) at 11/24/16 1011 Last data filed at 11/24/16 0900  Gross per 24 hour  Intake          1501.04 ml  Output             1380 ml  Net           121.04 ml    LABS: Basic Metabolic Panel:  Recent Labs  11/21/16 1603  11/23/16 0341 11/24/16 0600  NA 138  < > 138 140  K 2.8*  < > 3.2* 4.0  CL 99*  < > 93* 93*  CO2 30  < > 33*  33*  GLUCOSE 206*  < > 153* 97  BUN 35*  < > 43* 52*  CREATININE 1.38*  < > 1.34* 1.34*  CALCIUM 6.7*  < > 7.1* 8.1*  MG 2.1  --   --   --   < > = values in this interval not displayed. Liver Function Tests:  Recent Labs  11/23/16 0341 11/24/16 0600  AST 505* 280*  ALT 993* 721*  ALKPHOS 229* 240*  BILITOT 8.3* 10.5*  PROT 4.5* 4.8*  ALBUMIN 2.1* 2.2*   No results for input(s): LIPASE, AMYLASE in the last 72 hours. CBC:  Recent Labs  11/23/16 0341 11/24/16 0600  WBC 21.7* 20.8*  HGB 10.6* 10.6*  HCT 33.7* 33.8*  MCV 91.1 90.6  PLT 70* 71*   Cardiac Enzymes: No results for input(s): CKTOTAL, CKMB, CKMBINDEX, TROPONINI in the last 72 hours. BNP: Invalid input(s): POCBNP D-Dimer: No results for input(s): DDIMER in the last 72 hours. Hemoglobin A1C: No results for input(s): HGBA1C in the last 72 hours. Fasting Lipid Panel: No results for input(s): CHOL, HDL, LDLCALC,  TRIG, CHOLHDL, LDLDIRECT in the last 72 hours. Thyroid Function Tests: No results for input(s): TSH, T4TOTAL, T3FREE, THYROIDAB in the last 72 hours.  Invalid input(s): FREET3 Anemia Panel: No results for input(s): VITAMINB12, FOLATE, FERRITIN, TIBC, IRON, RETICCTPCT in the last 72 hours.  RADIOLOGY: Ct Head Wo Contrast  Result Date: 11/28/2016 CLINICAL DATA:  Altered mental status EXAM: CT HEAD WITHOUT CONTRAST TECHNIQUE: Contiguous axial images were obtained from the base of the skull through the vertex without intravenous contrast. COMPARISON:  08/17/2013 FINDINGS: Brain: Diffuse atrophic changes are noted. Mild chronic white matter ischemic change is noted and stable. No findings to suggest acute hemorrhage, acute infarction or space-occupying mass lesion are noted. Mild basal ganglia calcifications are seen bilaterally. Vascular: No hyperdense vessel or unexpected calcification. Skull: Normal. Negative for fracture or focal lesion. Sinuses/Orbits: No acute finding. Other: Nasogastric catheter is noted  coiled within the posterior nasopharynx. IMPRESSION: Chronic ski make and atrophic changes without acute abnormality. Nasogastric catheter coiled within the posterior nasopharynx Electronically Signed   By: Inez Catalina M.D.   On: 12/14/2016 16:28   Dg Chest Port 1 View  Result Date: 11/24/2016 CLINICAL DATA:  Respiratory failure EXAM: PORTABLE CHEST 1 VIEW COMPARISON:  November 23, 2016 FINDINGS: Central catheter tip is in superior vena cava. Nasogastric tube no longer apparent. There is a skin fold on the left but no evident pneumothorax. There are pleural effusions bilaterally. There is interstitial edema diffusely. There is airspace consolidation in the medial left base. Heart is mildly enlarged with pulmonary venous hypertension. Patient is status post internal mammary bypass grafting. Postoperative changes noted in the right neck as well as in the mid to lower cervical spine. There is advanced erosive arthropathy in the right shoulder with remodeling of the humeral head with advanced avascular necrosis change. IMPRESSION: Congestive heart failure. Question alveolar edema versus superimposed pneumonia medial left base. Stable cardiac silhouette. There is aortic atherosclerosis. 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 There is an apparent skin fold on the left without evident pneumothorax. Overall appearance similar to 1 day prior. Electronically Signed   By: Lowella Grip III M.D.   On: 11/24/2016 07:35   Dg Chest Port 1 View  Result Date: 11/23/2016 CLINICAL DATA:  Respiratory failure EXAM: PORTABLE CHEST 1 VIEW COMPARISON:  Yesterday FINDINGS: Endotracheal tube and NG tube are stable. Vascular catheter via the left jugular vein and its tip in the SVC is stable. Normal heart size. Extensive diffuse airspace disease is stable. Hemidiaphragms are obscured. Pleural effusions cannot be excluded. IMPRESSION:  Stable diffuse airspace disease. Electronically Signed   By: Marybelle Killings M.D.   On: 11/23/2016 07:22   Dg Chest Port 1 View  Result Date: 11/22/2016 CLINICAL DATA:  Intubated patient, respiratory failure, septic shock, CHF. EXAM: PORTABLE CHEST 1 VIEW COMPARISON:  Portable chest x-ray of November 21, 2016 FINDINGS: The lungs are hypoinflated. Considerable volume loss on the right persists with obscuration of the hemidiaphragm. The left lung is better inflated. The interstitial markings of both lungs remain increased but have improved slightly. The cardiac silhouette remains enlarged. The pulmonary vascularity is less engorged. The endotracheal tube tip lies approximately 1.9 cm above the carina. The esophagogastric tube tip in proximal port project below the GE junction. The left internal jugular venous catheter tip projects at the cavoatrial junction. IMPRESSION: Slight interval improvement in pulmonary interstitial edema. Persistent interstitial prominence bilaterally. Small bilateral pleural effusions  greatest on the right which may obscure atelectasis or pneumonia. Stable cardiomegaly with decreased pulmonary vascular congestion. The support tubes are in reasonable position. Electronically Signed   By: David  Martinique M.D.   On: 11/22/2016 07:28   Dg Chest Port 1 View  Result Date: 11/21/2016 CLINICAL DATA:  Intubated patient, respiratory failure EXAM: PORTABLE CHEST 1 VIEW COMPARISON:  Portable chest x-ray of November 20, 2016 FINDINGS: The lungs are reasonably well inflated but confluent density is present within the lower 2/3 of the right lung and hazy increased density is noted throughout the left lung. The hemidiaphragms are obscured. The cardiac silhouette is enlarged. The patient has undergone previous median sternotomy. There is calcification in the wall of the aortic arch. The pulmonary vascularity is engorged. The endotracheal tube tip lies approximately 3.2 cm above the carina. The esophagogastric tube tip  and proximal port project below the GE junction. The left internal jugular venous catheter tip projects over the distal third of the SVC. IMPRESSION: CHF with pulmonary interstitial and alveolar edema. Basilar and mid lung density on the right may reflect pneumonia. Bilateral pleural effusions are present. There has been little change since yesterday's study. The support devices are in reasonable position. Thoracic aortic atherosclerosis. Electronically Signed   By: David  Martinique M.D.   On: 11/21/2016 07:57   Dg Chest Port 1 View  Result Date: 11/20/2016 CLINICAL DATA:  ET tube, respiratory failure EXAM: PORTABLE CHEST 1 VIEW COMPARISON:  11/19/2016 FINDINGS: Endotracheal tube and left central line as well as NG tube remain in place, unchanged. Moderate bilateral layering effusions. Diffuse bilateral airspace disease, likely edema. Mild cardiomegaly. IMPRESSION: Moderate layering bilateral pleural effusions with bilateral airspace disease, likely CHF. No real change. Electronically Signed   By: Rolm Baptise M.D.   On: 11/20/2016 07:35   Dg Chest Port 1 View  Result Date: 11/19/2016 CLINICAL DATA:  Hypoxia EXAM: PORTABLE CHEST 1 VIEW COMPARISON:  November 18, 2016 FINDINGS: Endotracheal tube tip is 1.2 cm above the carina. Nasogastric tube tip and side port are below the diaphragm. Central catheter tip is in the superior vena cava. No pneumothorax. There are pleural effusions bilaterally with underlying interstitial edema. There is atelectatic change in the lung bases. Heart is mildly enlarged with pulmonary vascularity within normal limits. There is aortic atherosclerosis. No adenopathy evident. There is postoperative change in the lower cervical spine. IMPRESSION: Tube and catheter positions as described without pneumothorax. The endotracheal tube tip is near the carina; advise withdrawing endotracheal tube approximately 2 cm. Evidence a degree of underlying congestive heart failure. Bibasilar atelectasis. There  is aortic atherosclerosis. Electronically Signed   By: Lowella Grip III M.D.   On: 11/19/2016 07:06   Dg Chest Portable 1 View  Result Date: 11/17/2016 CLINICAL DATA:  Central line placement EXAM: PORTABLE CHEST 1 VIEW COMPARISON:  12/05/2016 FINDINGS: Left central line tip is in the upper right atrium. No pneumothorax. Cardiomegaly. Bilateral layering effusions and bilateral airspace disease, likely edema/ CHF. NG tube and endotracheal tube are unchanged. Left central line tip in the upper right atrium. No pneumothorax. IMPRESSION: Continued bilateral airspace disease and layering effusions, likely edema/CHF. Electronically Signed   By: Rolm Baptise M.D.   On: 11/19/2016 16:51   Dg Chest Port 1 View  Result Date: 11/21/2016 CLINICAL DATA:  Patient status post intubation. EXAM: PORTABLE CHEST 1 VIEW COMPARISON:  Chest radiograph 11/27/2016 FINDINGS: ET tube terminates in the mid trachea. Enteric tube tip and side-port project over the stomach. Pacer apparatus  overlies the left hemithorax. Patient status post median sternotomy. Low lung volumes. Persistent moderate right and small left pleural effusions with underlying pulmonary consolidation which appears worsened within the right greater than left mid and lower lungs. No pneumothorax. Osseous destruction right humeral head. IMPRESSION: ET tube terminates in the mid trachea. Worsening moderate right and small left pleural effusions with increasing bilateral underlying heterogeneous pulmonary opacities which may represent atelectasis or infection. Electronically Signed   By: Lovey Newcomer M.D.   On: 11/16/2016 14:17   Dg Chest Port 1 View  Result Date: 12/10/2016 CLINICAL DATA:  Shortness of breath for 1 day. EXAM: PORTABLE CHEST 1 VIEW COMPARISON:  06/20/2016 and prior exams FINDINGS: Cardiomegaly and CABG changes noted. A new moderate right pleural effusion noted with right lower lung atelectasis. A small left pleural effusion has enlarged with  associated left basilar atelectasis. There is no evidence of pneumothorax. No acute bony abnormality noted. Severe right shoulder degenerative changes and cervical spine surgical changes noted. IMPRESSION: New moderate right pleural effusion and right lower lung atelectasis. Slightly increased small left pleural effusion with left basilar atelectasis. Cardiomegaly and CABG changes. Electronically Signed   By: Margarette Canada M.D.   On: 12/13/2016 08:27   US Abdomen Limited Ruq  Result Date: 11/19/2016 CLINICAL DATA:  Transaminitis EXAM: US ABDOMEN LIMITED - RIGHT UPPER QUADRANT COMPARISON:  CT scan the abdomen pelvis of January 01, 2014 FINDINGS: The study was done portably. The patient is intubated. Defibrillation pads limit the acoustical window. Gallbladder: The gallbladder is adequately distended. There is gallbladder wall thickening to 11.6 mm. There is pericholecystic fluid. There is no positive sonographic Murphy's sign. No stones or sludge are evident. Common bile duct: Diameter: 3.8 mm Liver: The hepatic echotexture is heterogeneous. There is no focal mass nor ductal dilation. IMPRESSION: Gallbladder wall thickening and pericholecystic fluid may reflect acalculous cholecystitis. Heterogeneous hepatic echotexture is nonspecific. Normal appearing common bile duct. Electronically Signed   By: David  Martinique M.D.   On: 11/19/2016 07:05    PHYSICAL EXAM  General: Chronically ill appearing. Awake.   Neck: supple. JVP 9-10  Cor: PMI nondisplaced.  Mildly tachy, irregular, No M/G/R noted Lungs: Bilateral rhonchi.  Abdomen: Soft, non-tender, distended, no HSM. No bruits or masses. +BS  Extremities: No rash. 1+ edema to knees.  Right hand blue discoloration.  Neuro: Alert/oriented.    TELEMETRY: Personally reviewed, Afib in 100s   ASSESSMENT AND PLAN: 81 yo with extensive history of PAD, CAD s/p CABG, interstitial lung disease, pulmonary hypertension is seen for shock and acute hypoxemic respiratory  failure.   1. Shock: Suspect mixed cardiogenic and septic shock.  Echo this admission showed EF 25-30%, dyskinetic anteroseptal wall and apex, mild to moderately decreased RV systolic function, PASP 56 mmHg.  Prior echo in 5/17 with EF 50-55%.  She has Staph aureus in urine, suspect Staph bacteremia, ?PNA source.  Currently on norepinephrine and vasopressin.  - BP is better today, think we can stop vasopressin now, then gradually wean norepinephrine.  - Continue ceftriaxone. S aureus from urine pan-sensitive.  - Given history of subclavian stenosis, we checked BP by cuff on thigh and it actually matched the axillary arterial line measurement pretty well. 2. Acute systolic CHF: New fall in EF noted on echo this admission, 25-30% compared to 50-55% in 5/17.  Anteroseptal wall is dyskinetic, fall in EF may not be acute.  I am concerned that Cristina Parker has compromised flow to her LIMA (left subclavian appeared severely stenosed  to occluded on prior doppler evaluation).  CVP 9 today with co-ox 65%.  - I will give Lasix 60 mg IV x 1 today, not markedly volume overloaded by co-ox but sats low.  3. Atrial fibrillation with RVR: New. HR in 100s today.     - Continue bivalirudin for anticoagulation.   - Amiodarone use concerning with high LFTs, but LFTs are likely high due to shock liver. Trending down.  Continue amiodarone.  - Continue digoxin 0.125 mg daily. Follow levels (check in am).  - Possible eventual DCCV after recovery.  4. Elevated LFTs: Hepatitis viral labs negative.  Abdominal US unremarkable.  Suspect shock liver in setting of hypotension. LFTs continue to trend down.  As above, would continue amiodarone for now. No change.  5. AKI: Creatinine stable currently in 1.3 range.  6. ID: Concern for PNA with septic shock.  Has Staph aureus in urine so may be triggering organism. WBCs and PCT high initially, WBCs remain high.  - Remains on ceftriaxone.  7. Pulmonary hypertension: She was on Opsumit in  the past, stopped in 5/18 with concern for increased swelling.  No change.  8. Interstitial lung disease: Sees Dr. Chase Caller.  Possible NSIP.  9. Subclavian stenosis: Bilateral, left subclavian stenosis severe on 2016 doppler evaluation.. ART line and cuff pressures accurate. Correlated with thigh cuff.  No change.  10. CAD: s/p CABG.  Holding statin with increased LFTs. Fall in EF between 5/17 and 6/18 is concerning.  With anteroseptal dyskinesis, Dr. Aundra Dubin is concerned that she may have lost her LIMA => at risk with history of severe left subclavian stenosis. Will need to consider further work up once stabilizes. No change.  11. Thrombocytopenia: Stable at 71 today.  ?Due to HIT versus sepsis.   - HIT test pending.  - Continue bivalirudin.    Loralie Champagne, MD  11/24/2016 10:11 AM   Advanced Heart Failure Team Pager [phone] (M-F; 7a - 4p)  Please contact [facility] for night-coverage after hours (4p -7a ) and weekends on [email or web address]

## 2016-11-24 NOTE — Progress Notes (Signed)
Pt with continued increased WOB, despite ativan. Pt agreed to try bipap. Pt placed on Bipap (10/5 R 10) at 12:04. Pt WOB decreased, pt appears much more relaxed at this time. RN at bedside. RT will continue to closely monitor.

## 2016-11-25 ENCOUNTER — Encounter (HOSPITAL_COMMUNITY): Payer: Medicare Other

## 2016-11-25 ENCOUNTER — Inpatient Hospital Stay (HOSPITAL_COMMUNITY): Payer: Medicare Other

## 2016-11-25 LAB — COMPREHENSIVE METABOLIC PANEL
ALBUMIN: 2 g/dL — AB (ref 3.5–5.0)
ALT: 458 U/L — ABNORMAL HIGH (ref 14–54)
AST: 176 U/L — AB (ref 15–41)
Alkaline Phosphatase: 210 U/L — ABNORMAL HIGH (ref 38–126)
Anion gap: 14 (ref 5–15)
BUN: 68 mg/dL — AB (ref 6–20)
CHLORIDE: 95 mmol/L — AB (ref 101–111)
CO2: 31 mmol/L (ref 22–32)
Calcium: 8.2 mg/dL — ABNORMAL LOW (ref 8.9–10.3)
Creatinine, Ser: 1.65 mg/dL — ABNORMAL HIGH (ref 0.44–1.00)
GFR calc Af Amer: 33 mL/min — ABNORMAL LOW (ref >60)
GFR calc non Af Amer: 28 mL/min — ABNORMAL LOW (ref >60)
GLUCOSE: 116 mg/dL — AB (ref 65–99)
POTASSIUM: 3.8 mmol/L (ref 3.5–5.1)
Sodium: 140 mmol/L (ref 135–145)
Total Bilirubin: 12.7 mg/dL — ABNORMAL HIGH (ref 0.3–1.2)
Total Protein: 4.5 g/dL — ABNORMAL LOW (ref 6.5–8.1)

## 2016-11-25 LAB — CBC
HEMATOCRIT: 31.5 % — AB (ref 36.0–46.0)
Hemoglobin: 9.8 g/dL — ABNORMAL LOW (ref 12.0–15.0)
MCH: 28.4 pg (ref 26.0–34.0)
MCHC: 31.1 g/dL (ref 30.0–36.0)
MCV: 91.3 fL (ref 78.0–100.0)
Platelets: 40 10*3/uL — ABNORMAL LOW (ref 150–400)
RBC: 3.45 MIL/uL — AB (ref 3.87–5.11)
RDW: 17.4 % — ABNORMAL HIGH (ref 11.5–15.5)
WBC: 17.7 10*3/uL — AB (ref 4.0–10.5)

## 2016-11-25 LAB — APTT: APTT: 91 s — AB (ref 24–36)

## 2016-11-25 LAB — COOXEMETRY PANEL
CARBOXYHEMOGLOBIN: 1.7 % — AB (ref 0.5–1.5)
METHEMOGLOBIN: 0.5 % (ref 0.0–1.5)
O2 Saturation: 48.4 %
Total hemoglobin: 10 g/dL — ABNORMAL LOW (ref 12.0–16.0)

## 2016-11-25 LAB — PHOSPHORUS: Phosphorus: 4.9 mg/dL — ABNORMAL HIGH (ref 2.5–4.6)

## 2016-11-25 LAB — GLUCOSE, CAPILLARY
GLUCOSE-CAPILLARY: 198 mg/dL — AB (ref 65–99)
Glucose-Capillary: 128 mg/dL — ABNORMAL HIGH (ref 65–99)
Glucose-Capillary: 74 mg/dL (ref 65–99)
Glucose-Capillary: 96 mg/dL (ref 65–99)

## 2016-11-25 LAB — MAGNESIUM: Magnesium: 1.5 mg/dL — ABNORMAL LOW (ref 1.7–2.4)

## 2016-11-25 MED ORDER — MORPHINE SULFATE (PF) 4 MG/ML IV SOLN
1.0000 mg | Freq: Once | INTRAVENOUS | Status: AC
  Administered 2016-11-25: 1 mg via INTRAVENOUS
  Filled 2016-11-25: qty 1

## 2016-11-25 MED ORDER — FUROSEMIDE 10 MG/ML IJ SOLN
120.0000 mg | Freq: Four times a day (QID) | INTRAVENOUS | Status: DC
  Filled 2016-11-25: qty 12

## 2016-11-25 MED ORDER — FUROSEMIDE 10 MG/ML IJ SOLN
40.0000 mg | Freq: Once | INTRAMUSCULAR | Status: AC
  Administered 2016-11-25: 40 mg via INTRAVENOUS
  Filled 2016-11-25: qty 4

## 2016-11-25 MED ORDER — DEXTROSE 5 % IV SOLN
120.0000 mg | Freq: Once | INTRAVENOUS | Status: AC
  Administered 2016-11-25: 120 mg via INTRAVENOUS
  Filled 2016-11-25: qty 12

## 2016-11-25 MED ORDER — MIDAZOLAM HCL 2 MG/2ML IJ SOLN
INTRAMUSCULAR | Status: AC
  Filled 2016-11-25: qty 2

## 2016-11-25 MED ORDER — FENTANYL CITRATE (PF) 100 MCG/2ML IJ SOLN
INTRAMUSCULAR | Status: AC
  Filled 2016-11-25: qty 2

## 2016-11-25 MED FILL — Medication: Qty: 1 | Status: AC

## 2016-11-26 LAB — GLUCOSE, CAPILLARY: GLUCOSE-CAPILLARY: 112 mg/dL — AB (ref 65–99)

## 2016-11-27 ENCOUNTER — Telehealth: Payer: Self-pay

## 2016-11-27 NOTE — Telephone Encounter (Signed)
On 11/27/2016 I received a death certificate from [facility] (original). The death certificate is for cremation. The patient is a patient of Doctor Maneem. The death certificate will be taken to [facility] ICU tomorrow am for signature.   On 12/19/2016 I received the death certificate back from Doctor Maneem. I got the death certificate ready and called the funeral home to let them know the death certificate is ready for pickup. I also faxed a copy to the funeral home per the funeral home request.

## 2016-11-28 LAB — SEROTONIN RELEASE ASSAY (SRA)
SRA .2 IU/mL UFH Ser-aCnc: 15 % (ref 0–20)
SRA, HIGH DOSE HEPARIN: 7 % (ref 0–20)

## 2016-12-16 NOTE — Progress Notes (Signed)
Progress Note  Patient Name: Cristina Parker Date of Encounter: 12-17-16  Primary Cardiologist: Aundra Dubin  Subjective   Extubated yesterday. Currently to With overlying oxygen saturations, and increased work of breathing despite BiPAP  Inpatient Medications    Scheduled Meds: . chlorhexidine  15 mL Mouth Rinse BID  . digoxin  0.125 mg Intravenous Daily  . insulin aspart  0-15 Units Subcutaneous Q4H  . mouth rinse  15 mL Mouth Rinse q12n4p  .  morphine injection  1 mg Intravenous Once  . pantoprazole (PROTONIX) IV  40 mg Intravenous Q24H  . sodium chloride flush  10-40 mL Intracatheter Q12H   Continuous Infusions: . sodium chloride    . amiodarone 30 mg/hr (11/24/16 2101)  . bivalirudin (ANGIOMAX) infusion 0.5 mg/mL (Non-ACS indications) 0.05 mg/kg/hr (2016/12/17 0552)  . cefTRIAXone (ROCEPHIN)  IV Stopped (11/24/16 1700)  . dextrose 5 % and 0.9% NaCl 30 mL/hr at 2016-12-17 0012  . furosemide    . norepinephrine (LEVOPHED) Adult infusion Stopped (11/24/16 1445)  . phenylephrine (NEO-SYNEPHRINE) Adult infusion Stopped (11/21/16 0648)   PRN Meds: ipratropium-albuterol, LORazepam, magic mouthwash w/lidocaine, ondansetron (ZOFRAN) IV, sodium chloride flush   Vital Signs    Vitals:   12/17/16 0630 December 17, 2016 0700 2016-12-17 0736 17-Dec-2016 0800  BP: (!) 120/52 (!) 108/47  (!) 124/57  Pulse:      Resp: (!) 31 (!) 32  (!) 26  Temp:   97.4 F (36.3 C)   TempSrc:   Axillary   SpO2:      Weight:      Height:        Intake/Output Summary (Last 24 hours) at 17-Dec-2016 0840 Last data filed at Dec 17, 2016 0700  Gross per 24 hour  Intake          1023.19 ml  Output              325 ml  Net           698.19 ml   Filed Weights   11/23/16 0329 11/24/16 0500 2016-12-17 0500  Weight: 132 lb 8 oz (60.1 kg) 136 lb 1.6 oz (61.7 kg) 138 lb 14.4 oz (63 kg)    Telemetry    Atrial fibrillation with a rapid ventricular response - Personally Reviewed  ECG    None - Personally  Reviewed  Physical Exam   GEN: Chronically ill-appearing in mild to moderate respiratory distress Neck:  8 cm JVD Cardiac:  irregularly irregular tachycardia, no murmurs, rubs, or gallops.  Respiratory:  scattered rales bilaterally GI: Soft, nontender, non-distended  MS: No edema; No deformity. Cool bilaterally Neuro:  Nonfocal  Psych: Blunted affect  Labs    Chemistry Recent Labs Lab 11/23/16 0341 11/24/16 0600 2016-12-17 0400  NA 138 140 140  K 3.2* 4.0 3.8  CL 93* 93* 95*  CO2 33* 33* 31  GLUCOSE 153* 97 116*  BUN 43* 52* 68*  CREATININE 1.34* 1.34* 1.65*  CALCIUM 7.1* 8.1* 8.2*  PROT 4.5* 4.8* 4.5*  ALBUMIN 2.1* 2.2* 2.0*  AST 505* 280* 176*  ALT 993* 721* 458*  ALKPHOS 229* 240* 210*  BILITOT 8.3* 10.5* 12.7*  GFRNONAA 37* 37* 28*  GFRAA 42* 42* 33*  ANIONGAP _0 Hematology Recent Labs Lab 11/23/16 0341 11/24/16 0600 12/17/2016 0400  WBC 21.7* 20.8* 17.7*  RBC 3.70* 3.73* 3.45*  HGB 10.6* 10.6* 9.8*  HCT 33.7* 33.8* 31.5*  MCV 91.1 90.6 91.3  MCH 28.6 28.4 28.4  MCHC 31.5 31.4  31.1  RDW 16.4* 16.8* 17.4*  PLT 70* 71* 40*    Cardiac Enzymes Recent Labs Lab 11/26/2016 1441 12/15/2016 1954  TROPONINI 0.90* 1.53*   No results for input(s): TROPIPOC in the last 168 hours.   BNPNo results for input(s): BNP, PROBNP in the last 168 hours.   DDimer No results for input(s): DDIMER in the last 168 hours.   Radiology    Dg Chest Port 1 View  Result Date: 12/13/16 CLINICAL DATA:  Acute respiratory failure EXAM: PORTABLE CHEST 1 VIEW COMPARISON:  11/24/2016 and prior exams FINDINGS: Cardiomegaly and CABG changes again noted. A left IJ central venous catheter with tip overlying the superior cavoatrial junction again noted. Increasing bilateral airspace opacities/edema noted with continued bilateral pleural effusions and right lower lung consolidation/ atelectasis. There is no evidence of pneumothorax. No other changes identified. IMPRESSION:  Increasing bilateral airspace opacities/edema. No other significant change. Electronically Signed   By: Margarette Canada M.D.   On: 12-13-2016 07:31   Dg Chest Port 1 View  Result Date: 11/24/2016 CLINICAL DATA:  Respiratory failure EXAM: PORTABLE CHEST 1 VIEW COMPARISON:  November 23, 2016 FINDINGS: Central catheter tip is in superior vena cava. Nasogastric tube no longer apparent. There is a skin fold on the left but no evident pneumothorax. There are pleural effusions bilaterally. There is interstitial edema diffusely. There is airspace consolidation in the medial left base. Heart is mildly enlarged with pulmonary venous hypertension. Patient is status post internal mammary bypass grafting. Postoperative changes noted in the right neck as well as in the mid to lower cervical spine. There is advanced erosive arthropathy in the right shoulder with remodeling of the humeral head with advanced avascular necrosis change. IMPRESSION: Congestive heart failure. Question alveolar edema versus superimposed pneumonia medial left base. Stable cardiac silhouette. There is aortic atherosclerosis. 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 There is an apparent skin fold on the left without evident pneumothorax. Overall appearance similar to 1 day prior. Electronically Signed   By: Lowella Grip III M.D.   On: 11/24/2016 07:35    Cardiac Studies   Co-ox - 87  Patient Profile     81 y.o. female with multiple medical problems now with respiratory failure thought secondary to both cardiogenic and septic shock  Assessment & Plan    1. Shock - the patient's mixed venous saturation has gone down indicating worsening cardiac output and circulatory compromise. She appears to be volume overloaded on chest x-ray. Will try to give additional Lasix, although I suspect she will require mechanical ventilation and reinitiation of  pressors. 2. Acute systolic heart failure - her EF is down compared to 3 weeks ago. She may well be ischemic. She remains volume overloaded. 3. Atrial fibrillation with a rapid ventricular response - her heart rate is in the 100s. No additional AV nodal blocking drugs are indicated at this time and with her low oxygen saturation. She will continue her current medical therapy. We will need to watch her carefully for evidence of digoxin toxicity although there is none now. 4. Acute on chronic renal insufficiency, stage III - her creatinine has increased since yesterday. We will follow her closely. 5. Hepatitis - I suspect this is secondary to low output and shock liver 6. Disposition - her prognosis is poor. I've tried to relate this to her son who has recently arrived in town.  Signed, Cristopher Peru, MD  12/13/2016, 8:40  AM  Patient ID: DEMIYA MAGNO, female   DOB: December 07, 1935, 81 y.o.   MRN: [id number]

## 2016-12-16 NOTE — Progress Notes (Signed)
SLP Cancellation Note  Patient Details Name: CHANNING SAVICH MRN: [id number] DOB: 1935/08/10   Cancelled treatment:       Reason Eval/Treat Not Completed: Medical issues which prohibited therapy. Pt with increased WOB, requiring continuous biPAP. Will hold therapy today, f/u next date.  Deneise Lever, [place], [place] Speech-Language Pathologist [phone]   Aliene Altes 12-10-16, 9:52 AM

## 2016-12-16 NOTE — Progress Notes (Signed)
eLink Physician-Brief Progress Note Patient Name: Cristina Parker DOB: 16-Aug-1935 MRN: [id number]   Date of Service  December 15, 2016  HPI/Events of Note  Call from nurse reporting increased resp distress in patient.  PCXR shows increase in edema.  Patient has been on HFNC but does have BiPAP ordered.  Nurse reports crackles in lung fields bilaterally.  eICU Interventions  Plan: Initiate BiPAP already ordered Lasix 40 mg IV times one dose     Intervention Category Intermediate Interventions: Respiratory distress - evaluation and management  Azelia Reiger 12/15/2016, 5:44 AM

## 2016-12-16 NOTE — Progress Notes (Signed)
Patient's body taken to morgue. Called son and sister to come get belongings and unable to reach family. Belongings placed at the front desk for the shift. Belongings as follows: 3 cards, 1 poster, 1 "Boss Girl" book and 1 medium artificial peace lily. Verified with Benjaman Pott Charge RN.

## 2016-12-16 NOTE — Progress Notes (Signed)
While preparing for intubation the Patient went into PEA arrest. Bipap in place while PEA and treatment team in the room. Dr.Mannam called and informed the family. [facility] and patient was declined. Referral number is [phone]. Post care being currently preformed.

## 2016-12-16 NOTE — Progress Notes (Addendum)
Daily Progress Note   Patient Name: Cristina Parker       Date: 05-Dec-2016 DOB: [birth year]  Age: 81 y.o. MRN#: [id number] Attending Physician: Marshell Garfinkel, MD Primary Care Physician: Shawnee Knapp, MD Admit Date: 12/12/2016  Reason for Consultation/Follow-up: Establishing goals of care and Psychosocial/spiritual support  Subjective: Cristina Parker has deteriorated since I saw her yesterday. She is now back on BiPAP with significant work of breathing and fatigue. She will open her eyes to voice, but is now unable to effectively communicate due to her respiratory distress. Her son, Cristina Parker, is at the bedside. Per care nurse, CCM has been in contact with the patient's other son, Cristina Parker.   Length of Stay: 7  Current Medications: Scheduled Meds:  . chlorhexidine  15 mL Mouth Rinse BID  . digoxin  0.125 mg Intravenous Daily  . insulin aspart  0-15 Units Subcutaneous Q4H  . mouth rinse  15 mL Mouth Rinse q12n4p  . pantoprazole (PROTONIX) IV  40 mg Intravenous Q24H  . sodium chloride flush  10-40 mL Intracatheter Q12H    Continuous Infusions: . sodium chloride    . amiodarone 30 mg/hr (11/24/16 2101)  . bivalirudin (ANGIOMAX) infusion 0.5 mg/mL (Non-ACS indications) 0.05 mg/kg/hr (2016/12/05 0552)  . cefTRIAXone (ROCEPHIN)  IV Stopped (11/24/16 1700)  . dextrose 5 % and 0.9% NaCl 30 mL/hr at 2016-12-05 0012  . furosemide    . norepinephrine (LEVOPHED) Adult infusion Stopped (11/24/16 1445)  . phenylephrine (NEO-SYNEPHRINE) Adult infusion Stopped (11/21/16 0648)    PRN Meds: ipratropium-albuterol, LORazepam, magic mouthwash w/lidocaine, ondansetron (ZOFRAN) IV, sodium chloride flush  Physical Exam  Constitutional: She appears distressed (Significant respiratory distress).  Acutely ill 81yo.  HENT:  Head: Normocephalic and atraumatic.  Pt  coughing frank blood x1 episode. Dried blood noted around lips. Dry mucous membranes.  Eyes: EOM are normal.  Neck: Neck supple.  Central line in neck limits ROM  Cardiovascular: An irregularly irregular rhythm present. Tachycardia present.   Pulmonary/Chest: Accessory muscle usage present. Tachypnea noted.  On BiPAP, poor air movement, RR ~34 bpm. Visibly tiring.   Abdominal: Soft. Bowel sounds are decreased.  Musculoskeletal: She exhibits edema.  Generalized weakness. Minimal movement due to respiratory status  Neurological:  Opens eyes to voice and will track, tries to respond to questions but unable to d/t breathing. Does follow simple commands  Skin: Skin is warm and dry. There is pallor.  Significant purple coloration on entirety of right hand to wrist. Hand cool and swollen. Purpling of finger tips on left hand and on the soles of both feet. Right foot cool. Purple on left ear. Skin pale with slight yellow tint. Multiple areas of bruising.  Psychiatric: Her mood appears anxious.           Vital Signs: BP (!) 124/57   Pulse (!) 30   Temp 97.4 F (36.3 C) (Axillary)   Resp (!) 26   Ht _0  (1.499 m)   Wt 63 kg (138 lb 14.4 oz)   SpO2 91%   BMI 28.05 kg/m  SpO2: SpO2:  (unable to obtain reading) O2 Device: O2 Device: Bi-PAP O2 Flow Rate: O2 Flow Rate (L/min): 13 L/min  Intake/output summary:  Intake/Output Summary (Last 24 hours) at 2016/12/25 0850 Last data filed at December 25, 2016 0700  Gross per 24 hour  Intake          1023.19 ml  Output              325 ml  Net           698.19 ml   LBM: Last BM Date: 2016-12-25 Baseline Weight: Weight: 55.8 kg (123 lb) (per chart record 5/18) Most recent weight: Weight: 63 kg (138 lb 14.4 oz)  Palliative Assessment/Data: PPS 10%   Flowsheet Rows     Most Recent Value  Intake Tab  Referral Department  Critical care  Unit at Time of Referral  ICU  Palliative Care Primary Diagnosis  Cardiac  Date Notified  11/22/16  Palliative  Care Type  New Palliative care  Reason for referral  Clarify Goals of Care  Date of Admission  12/12/2016  Date first seen by Palliative Care  11/23/16  # of days Palliative referral response time  1 Day(s)  # of days IP prior to Palliative referral  4  Clinical Assessment  Palliative Performance Scale Score  30%  Pain Max last 24 hours  Not able to report  Pain Min Last 24 hours  Not able to report  Dyspnea Max Last 24 Hours  Not able to report  Dyspnea Min Last 24 hours  Not able to report  Nausea Max Last 24 Hours  Not able to report  Nausea Min Last 24 Hours  Not able to report  Anxiety Max Last 24 Hours  Not able to report  Anxiety Min Last 24 Hours  Not able to report  Other Max Last 24 Hours  Not able to report  Psychosocial & Spiritual Assessment  Palliative Care Outcomes  Patient/Family meeting held?  Yes  Who was at the meeting?  pt  Palliative Care Outcomes  Clarified goals of care  Palliative Care follow-up planned  Yes, Facility      Patient Active Problem List   Diagnosis Date Noted  . Goals of care, counseling/discussion   . Palliative care by specialist   . Transaminitis   . Septic shock ([place])   . Acute systolic CHF (congestive heart failure) ([place])   . Atrial fibrillation with RVR ([place]) 12/12/2016  . Acute on chronic respiratory failure ([place]) 11/21/2016  . Demand ischemia ([place])   . Coronary artery disease involving native coronary artery of native heart without angina pectoris   . Pressure injury of skin 06/20/2016  . Sepsis due to Escherichia coli ([place])   . Bacteremia due to Escherichia coli   . Community acquired pneumonia of right upper lobe of lung ([place])   . CAP (community acquired pneumonia) 06/19/2016  . Centrilobular emphysema ([place]) 03/27/2016  . Pulmonary scarring 03/27/2016  . Physical deconditioning 03/27/2016  . Pulmonary hypertension ([place]) 12/23/2015  . Hypoxemia 12/23/2015  . Pulmonary hyperinflation 11/10/2015  . Dyspnea 11/10/2015  . ILD  (interstitial lung disease) ([place]) 09/15/2015  . Vitamin D deficiency 06/03/2015  . Acute on chronic respiratory failure with hypoxia ([place]) 03/16/2015  . Postinflammatory pulmonary fibrosis ([place]) 11/23/2014  . Chronic pain syndrome 11/04/2014  . Carotid stenosis 04/06/2014  . Aftercare following surgery of the circulatory system 04/06/2014  . Cataract 09/25/2012  . Arthritis 09/25/2012  . Depression 09/25/2012  . Osteoporosis 09/25/2012  . Duodenal stricture 09/16/2012  . Murmur 12/19/2011  . Occlusion and stenosis of carotid artery without mention of cerebral infarction 11/15/2011  .  Paroxysmal a-fib ([place]) 07/09/2011  . COPD exacerbation ([place]) 07/03/2011  . Peripheral vascular disease ([place]) 10/31/2007  . HIATAL HERNIA 10/31/2007  . Other and unspecified hyperlipidemia 07/18/2007  . Essential hypertension 07/18/2007  . Coronary atherosclerosis 07/18/2007  . VENTRICULAR ARRHYTHMIA 07/18/2007  . LUNG NODULE 07/18/2007  . G E R D 07/18/2007  . CAROTID ENDARTERECTOMY, LEFT, HX OF 07/18/2007    Palliative Care Assessment & Plan   HPI: 81 y.o. female  with past medical history of COPD, interstitial lung disease, atrial fib with RVR, coronary artery disease with CABG, diastolic heart failure, pulmonary hypertension admitted on 11/23/2016 with worsening shortness of breath, weight gain, worsening lower extremity edema. Plan was to  initially admit to stepdown unit and internal medicine when patient had what was thought to be a seizure in the emergency room. Her blood sugar at that time was in the 40s, and she was intubated and placed on the vent. She was hypotensive and started on pressors. Her last echo was in May 2017 with an ejection fraction of 50-55%. She currently uses 5-6 L of supplemental oxygen at all times at home. Her sats were 81% on 6 L upon presentation in the emergency room and she was in A. fib with RVR with a heart rate in the 150s. Her blood pressure dropped to the 70s and her  Cardizem drip was stopped. Work-up revealed pneumococcal pneumonia with septic shock and MSSA in urine culture. Palliative consulted to assist in clarifying goals of care in this critically ill woman.   Assessment: I had the fortunate opportunity to meet with Mrs. Heimann on 6/9, one day after she was extubated. When we met she was fully alert, oriented, and able to converse. Both alone and in the company of her sister Alma Friendly, who is her 62) and son Cristina Parker), we talked through her wishes in the event she did not improve. She wanted: No CPR, No defibrillation. She would like to be re-intubated if needed, but would like it to be for a 5 day trial period. If she does not improve to the point she can get off the ventilator by day five, she would want to transition to comfort care with medication and one way extubation. She does NOT want a trach or peg.  At this point, she is back on BiPAP and I expect will need re-intubation today. This was communicated to [facility] by the CCM provider. Her other son, Cristina Parker, is at her bedside. I explained the rationale for the BiPAP, and the possible need for re-intubation. He struggled to understand why she was declining, and the significance of reintubation, but did ask that she is kept comfortable throughout these changes. Mrs. Beitler asked yesterday that I not share her code status or end of life wishes with Cristina Parker, and instead defers to [place] to share this with him when necessary.   Recommendations/Plan:  Partial code:   No CPR, no defibrillation  Yes BiPAP, yes pressors, yes intubation  Intubate for 5 day trial period, if she is not off the vent by day 5, she would want comfort measures and one way extubation. No trach. No PEG.    For dyspnea, would recommend avoiding morphine as able in the setting of worsening renal function. Dilaudid would provide good relief with less risk of toxic metabolite build-up.  --Palliative will continue to follow and support the  patient and her family. Thankfully, Mrs. Corriveau was able to express her wishes with sound mind, which we will continue to honor  and support as things play out.   Code Status:  Limited code  Prognosis:   Unable to determine. I am very concerned she is declining despite aggressive measures, and believe she is fragile enough to have cardiac failure at any point. Even with intubation, I am concerned she will not survive this hospitalization.  Discharge Planning:  To Be Determined  Care plan was discussed with pt, son, CCM, care nurse.  Thank you for allowing the Palliative Medicine Team to assist in the care of this patient.  Total time: 25 minutes    Greater than 50%  of this time was spent counseling and coordinating care related to the above assessment and plan.  Charlynn Court, NP Palliative Medicine Team [phone] pager (7a-5p) Team Phone # [phone]

## 2016-12-16 NOTE — Discharge Summary (Signed)
Physician Discharge Summary  Patient ID: Cristina Parker MRN: [id number] DOB/AGE: October 13, 1935 81 y.o.  Admit date: 12/02/2016 Discharge date: 11/30/2016  Admission Diagnoses: Dyspnea  Discharge Diagnoses:  Vent dependent resp failure Cardiac arrest Pneumococcal pneumonia MSSA UTI Septic shock Acute on chronic CHF Pulmonary arterial hypertension Afib with RVR Elevated LFTs Thrombocytopenia ARF  Discharged Condition: Deceased  Hospital Course:  70-yar-old female with h/o Afib, PAH on macitentan per Dr Aundra Dubin, HTN, CAD s/p cabg, PAH, EF 50-55 and PVD   Presents to ED on  6/3 with ongoing shortness of breath for past few days and lower exrmity edma and weight gain.She arrived in the ED in resp distress. O2 sat 81% on 6L [place] with wheezes and in A-Fib with RVR with HR in 150s. She was started on albuterol and cardizem for A-fib with RVR. However, her sbp dropped to 70s and cardizem was stopped. With concern that she was fluid up, she only received a 750 mL NS bolus. CXR showed bilateral pleural effusions with R>L and cardiomegaly.    Admitted to the step down unit and was admitted by internal medicine team. While in ER, she had an episode which is suspected to be seizures. She was also found to be hypoglycemic with BS in 40s, and was  intubated.   In the ICU she remained critically ill maintained on multiple pressors. Noted to have elevated LFTs secondary to shock. Macitentn was held and heart failure team consulted.She had pneumococcal pneumonia and MSSA in the urine and was treated with antibiotics. Also noted to have low platelets with concern for HIT. She was placed on bivalirudin anticoagulation. She was extubated on 11/24/16 with her respiratory status continues to be tenuous. Multiple discussions were held with family and palliative care consulted. They're okay with temporary intubation but did not want her resuscitated in event of cardiac arrest. On the morning of 6/10 she had  increased work of breathing requiring BiPAP. The decision was made to intubate her before this could be done she went into a PEA arrest. She is not resuscitated in accordance with stated wishes of the patient and the family.  Consults: cardiology, palliative care   Disposition: 20-Expired   Allergies as of Dec 04, 2016   No Known Allergies     Medication List    ASK your doctor about these medications   atorvastatin 40 MG tablet Commonly known as:  LIPITOR Take 1 tablet (40 mg total) by mouth daily.   BREO ELLIPTA 100-25 MCG/INH Aepb Generic drug:  fluticasone furoate-vilanterol INHALE 1 PUFF INTO THE LUNGS DAILY   escitalopram 20 MG tablet Commonly known as:  LEXAPRO TAKE 1 TABLET(20 MG) BY MOUTH DAILY   EXCEDRIN TENSION HEADACHE 500-65 MG Tabs Generic drug:  Acetaminophen-Caffeine Take 1 tablet by mouth at bedtime as needed (pain/headache).   feeding supplement (ENSURE ENLIVE) Liqd Take 237 mLs by mouth 2 (two) times daily between meals.   furosemide 20 MG tablet Commonly known as:  LASIX Take 1 tablet daily on Monday, Wednesday, and Friday.   HYDROcodone-acetaminophen 10-325 MG tablet Commonly known as:  NORCO Take 1-2 tablets by mouth every 6 (six) hours as needed.   LORazepam 0.5 MG tablet Commonly known as:  ATIVAN Take 0.5 tablets (0.25 mg total) by mouth 2 (two) times daily.   metoprolol succinate 50 MG 24 hr tablet Commonly known as:  TOPROL-XL Take 1 tablet (50 mg total) by mouth daily. with or immediately following a meal.   multivitamin with minerals Tabs tablet Take 1  tablet by mouth daily.   nystatin 100000 UNIT/ML suspension Commonly known as:  MYCOSTATIN Take 5 mLs (500,000 Units total) by mouth 4 (four) times daily.   omeprazole 20 MG capsule Commonly known as:  PRILOSEC Take 20 mg by mouth daily.   OPSUMIT 10 MG Tabs Generic drug:  Macitentan Take 10 mg by mouth daily.   OXYGEN Inhale 5.5-8 L into the lungs continuous.   potassium  chloride 10 MEQ tablet Commonly known as:  K-DUR 1 tab daily on Monday, Wednesday, and Friday.   SUPER B COMPLEX PO Take 1 tablet by mouth daily.   vitamin C 1000 MG tablet Take 1,000 mg by mouth at bedtime.        Signed: Shavanna Furnari 11/30/2016, 9:57 AM

## 2016-12-16 NOTE — Progress Notes (Addendum)
PULMONARY / CRITICAL CARE MEDICINE   Name: Cristina Parker MRN: [id number] DOB: 1935-08-16    ADMISSION DATE:  12/10/2016 CONSULTATION DATE:  12/15/2016  REFERRING MD:  Dr. Posey Pronto   CHIEF COMPLAINT:  Dyspnea   HPI: 80 yo female presented with dyspnea, edema, wt gain, hypoxia, wheezing.  She was in A fib with RVR.  She required intubation.  She has PMHx of COPD with emphysema, CAD s/p CABG, A fib, diastolic CHF, and pulmonary hypertension on macitentan as outpt.  SUBJECTIVE:  Still has tenuous resp status. On and off bipap D5 started for hypoglycemia Failed swallow eval.  VITAL SIGNS: BP (!) 108/47   Pulse (!) 30   Temp 97.4 F (36.3 C) (Axillary)   Resp (!) 32   Ht _0  (1.499 m)   Wt 138 lb 14.4 oz (63 kg)   SpO2 91%   BMI 28.05 kg/m   VENTILATOR SETTINGS:    INTAKE / OUTPUT: I/O last 3 completed shifts: In: 1821.8 [I.V.:1621.8; NG/GT:150; IV Piggyback:50] Out: 1010 [Urine:1010]  PHYSICAL EXAMINATION: Blood pressure (!) 124/57, pulse (!) 30, temperature 97.4 F (36.3 C), temperature source Axillary, resp. rate (!) 26, height _1  (1.499 m), weight 138 lb 14.4 oz (63 kg), SpO2 91 %. Gen:      Moderate distress HEENT:  EOMI, sclera anicteric Neck:     No masses; no thyromegaly Lungs:    Clear to auscultation bilaterally; normal respiratory effort CV:         Regular rate and rhythm; no murmurs Abd:      + bowel sounds; soft, non-tender; no palpable masses, no distension Ext:    1+ edema; Rt hand is dusky Skin:      Warm and dry; no rash Neuro: Somnolent, confused.  LABS:  BMET  Recent Labs Lab 11/23/16 0341 11/24/16 0600 12-23-16 0400  NA 138 140 140  K 3.2* 4.0 3.8  CL 93* 93* 95*  CO2 33* 33* 31  BUN 43* 52* 68*  CREATININE 1.34* 1.34* 1.65*  GLUCOSE 153* 97 116*    Electrolytes  Recent Labs Lab 11/19/16 0452  11/20/16 0417 11/21/16 0326 11/21/16 1603  11/23/16 0341 11/24/16 0600 23-Dec-2016 0400  CALCIUM 8.1*  < > 7.1* 6.5* 6.7*  < > 7.1*  8.1* 8.2*  MG 2.0  < > 1.6* 1.9 2.1  --   --   --  1.5*  PHOS 5.9*  --  4.2  --   --   --   --   --  4.9*  < > = values in this interval not displayed.  CBC  Recent Labs Lab 11/23/16 0341 11/24/16 0600 December 23, 2016 0400  WBC 21.7* 20.8* 17.7*  HGB 10.6* 10.6* 9.8*  HCT 33.7* 33.8* 31.5*  PLT 70* 71* 40*    Coag's  Recent Labs Lab 11/23/2016 0755 11/19/16 0452  11/23/16 0840  11/23/16 2228 11/24/16 0531 2016/12/23 0630  APTT  --  40*  < > 191*  < > 74* 73* 91*  INR 1.93 5.60*  --  2.21  --   --   --   --   < > = values in this interval not displayed.  Sepsis Markers  Recent Labs Lab 11/17/2016 1538 11/19/16 0452 11/20/16 0417  PROCALCITON 0.12 0.65 1.47    ABG  Recent Labs Lab 11/19/16 0352 11/19/16 1757 11/21/16 0355  PHART 7.338* 7.393 7.378  PCO2ART 41.8 41.2 43.3  PO2ART 107 68.0* 80.8*    Liver Enzymes  Recent Labs Lab  11/23/16 0341 11/24/16 0600 Dec 14, 2016 0400  AST 505* 280* 176*  ALT 993* 721* 458*  ALKPHOS 229* 240* 210*  BILITOT 8.3* 10.5* 12.7*  ALBUMIN 2.1* 2.2* 2.0*    Cardiac Enzymes  Recent Labs Lab 11/30/2016 1441 12/14/2016 1954  TROPONINI 0.90* 1.53*    Glucose  Recent Labs Lab 11/24/16 0805 11/24/16 1128 11/24/16 1526 11/24/16 2007 12/14/16 0000 12/14/16 0434  GLUCAP 70 51* 92 74 128* 96    Imaging Dg Chest Port 1 View  Result Date: 12/14/16 CLINICAL DATA:  Acute respiratory failure EXAM: PORTABLE CHEST 1 VIEW COMPARISON:  11/24/2016 and prior exams FINDINGS: Cardiomegaly and CABG changes again noted. A left IJ central venous catheter with tip overlying the superior cavoatrial junction again noted. Increasing bilateral airspace opacities/edema noted with continued bilateral pleural effusions and right lower lung consolidation/ atelectasis. There is no evidence of pneumothorax. No other changes identified. IMPRESSION: Increasing bilateral airspace opacities/edema. No other significant change. Electronically Signed   By:  Margarette Canada M.D.   On: 12-14-16 07:31    STUDIES:  CT Head 6/3 > Chronic ski make atrophic changes without acute abnormality [place] ABD Limited RUQ 6/4 > Gallbladder wall thickening and pericholecystic fluid may reflect acalculous cholecystitis. Heterogeneous hepatic echotexture is nonspecific. Normal appearing common bile duct. ECHO 6/4 > EF 25-30, severely reduced systolic function, PA pressure 56  CULTURES: Urine 6/3 >> MSSA Blood 6/3 >> Pneumococcal Ag /603 >> positive Sputum 6/4 >> oral flora  ANTIBIOTICS: Rocephin 6/04 >>  Vancomycin 6/05 >> 6/06  SIGNIFICANT EVENTS: 6/03 presents to ED  6/08 PLT drop >> change to bivalirudin  LINES/TUBES: ETT 6/3 >> 6/08 CVC Left IJ 6/3 >> Left Axillary Aline 6/4 >> 6/06  DISCUSSION: 81 yo female with VDRF from Pneumococcal PNA, septic shock, acute combined CHF, A fib with RVR, COPD with emphysema.  She has reported hx of ILD/NSIP, but recent chest imaging and serology is not consistent with this.  She also has history of pulmonary hypertension.  ASSESSMENT / PLAN: Acute on chronic hypoxic respiratory failure. Hx of COPD with emphysema. - Monitor resp status. Use Bipap - Will likely need intubation today if there is not turn around with aggressive lasix  Pneumococcal pneumonia with septic shock. MSSA in urine culture. - Continue rocephin day 7. Follow Pct to determine length of antibiotics - Of pressors  Acute combined CHF. Hx of CAD s/p CABG. A fib with RVR. - Amiodarone gtt per cardiology - Continue lasix. Start aggressive laxix diuresis. - On bivalirudin. Anticoagulation - Follow CVP, Venous O2 sats  Pulmonary arterial hypertension - Off macitentan due to elevated LFTsd/c'ed in setting of increased LFTs - Cardiology is following.   Elevated LFTs related to shock, medications. - Follow LFTs. Improving  Anemia of critical illness and chronic disease. - Follow CBC  Thrombocytopenia. - HIT abs is negative. SRA is  pending  Acute renal failure >> baseline creatinine 0.66 from 10/31/16. Hyokalemia Montior urine output and cr  Moderate protein calorie malnutrition. Dysphagia. - Speech therapy, swallow eval  - Keep NPO  Hypoglycemia - SSI coverage - D5 drip  DVT prophylaxis - bivalirudin gtt SUP - protonix Nutrition - Keep NPO Goals of care - DNR. OK for reintubation  Palliative care on board  The patient is critically ill with multiple organ system failure and requires high complexity decision making for assessment and support, frequent evaluation and titration of therapies, advanced monitoring, review of radiographic studies and interpretation of complex data.   Critical Care Time  devoted to patient care services, exclusive of separately billable procedures, described in this note is 35 minutes.   Marshell Garfinkel MD [facility] Pager [phone] If no answer or after 3pm call: [phone] Dec 17, 2016, 7:51 AM

## 2016-12-16 NOTE — ED Provider Notes (Signed)
[place] [facility] DEPT Provider Note   CSN: [id number] Arrival date & time: 11/20/2016  2440     History   Chief Complaint Chief Complaint  Patient presents with  . Shortness of Breath    HPI PROSPERITY DARROUGH is a 81 y.o. female.  HPI Patient with extensive history of coronary artery disease status post CABG, paroxysmal atrial fibrillation not on anticoagulation. History is limited due to respiratory distress. Low 5 caveat applies. She presents with progressive shortness of breath for the last month worsening over the last day. Associated with bilateral lower extremity swelling and weight gain. She also endorses nausea but denies abdominal pain, vomiting or diarrhea. Patient denies any chest pain, palpitations, productive cough, fever or chills. Past Medical History:  Diagnosis Date  . Anemia   . Anxiety   . Arthritis   . Blood transfusion without reported diagnosis   . Cataract    BILATERAL  . Centrilobular emphysema ([place])   . Collagenous colitis   . Crohn's disease ([place])   . Depression   . Duodenal stricture   . Duodenal ulcer   . Esophageal reflux   . Heart disease   . Heart murmur   . Hiatal hernia   . Hyperlipemia   . Hypertension   . Osteoporosis   . Paroxysmal A-fib ([place]) 07/09/2011  . Peripheral vascular disease ([place])   . Pneumonia   . Pulmonary hypertension [facility])     Patient Active Problem List   Diagnosis Date Noted  . Goals of care, counseling/discussion   . Palliative care by specialist   . Transaminitis   . Septic shock ([place])   . Acute systolic CHF (congestive heart failure) ([place])   . Atrial fibrillation with RVR ([place]) 11/23/2016  . Acute on chronic respiratory failure ([place]) 12/06/2016  . Demand ischemia ([place])   . Coronary artery disease involving native coronary artery of native heart without angina pectoris   . Pressure injury of skin 06/20/2016  . Sepsis due to Escherichia coli ([place])   . Bacteremia due to Escherichia coli   . Community acquired  pneumonia of right upper lobe of lung ([place])   . CAP (community acquired pneumonia) 06/19/2016  . Centrilobular emphysema ([place]) 03/27/2016  . Pulmonary scarring 03/27/2016  . Physical deconditioning 03/27/2016  . Pulmonary hypertension ([place]) 12/23/2015  . Hypoxemia 12/23/2015  . Pulmonary hyperinflation 11/10/2015  . Dyspnea 11/10/2015  . ILD (interstitial lung disease) ([place]) 09/15/2015  . Vitamin D deficiency 06/03/2015  . Acute on chronic respiratory failure with hypoxia ([place]) 03/16/2015  . 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 Chronic pain syndrome 11/04/2014  . Carotid stenosis 04/06/2014  . Aftercare following surgery of the circulatory system 04/06/2014  . Cataract 09/25/2012  . Arthritis 09/25/2012  . Depression 09/25/2012  . Osteoporosis 09/25/2012  . Duodenal stricture 09/16/2012  . Murmur 12/19/2011  . Occlusion and stenosis of carotid artery without mention of cerebral infarction 11/15/2011  . Paroxysmal a-fib ([place]) 07/09/2011  . COPD exacerbation ([place]) 07/03/2011  . Peripheral vascular disease ([place]) 10/31/2007  . HIATAL HERNIA 10/31/2007  . Other and unspecified hyperlipidemia 07/18/2007  . Essential hypertension 07/18/2007  . Coronary atherosclerosis 07/18/2007  . VENTRICULAR ARRHYTHMIA 07/18/2007  . LUNG NODULE 07/18/2007  . G E R D 07/18/2007  . CAROTID ENDARTERECTOMY, LEFT, HX OF 07/18/2007    Past Surgical History:  Procedure Laterality Date  . ABDOMINAL HYSTERECTOMY    . 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 BUNIONECTOMY  1977   both feet   . CARDIAC CATHETERIZATION N/A 12/08/2015   Procedure: Right Heart Cath;  Surgeon: Larey Dresser, MD;  Location: [place] [facility];  Service: Cardiovascular;  Laterality: N/A;  . CAROTID ENDARTERECTOMY  05-1998   Left CEA  . CAROTID ENDARTERECTOMY  04/11/11   Right CEA  . CERVICAL DISC SURGERY    . CESAREAN SECTION    . COLONOSCOPY    . CORONARY ARTERY BYPASS  GRAFT    . EYE SURGERY  10/2010   left eye cataract  . FRACTURE SURGERY  1998   ORIF  . HIP SURGERY     right  . TUBAL LIGATION      OB History    No data available       Home Medications    Prior to Admission medications   Medication Sig Start Date End Date Taking? Authorizing Provider  Acetaminophen-Caffeine (EXCEDRIN TENSION HEADACHE) 500-65 MG TABS Take 1 tablet by mouth at bedtime as needed (pain/headache).   Yes [provider]  Ascorbic Acid (VITAMIN C) 1000 MG tablet Take 1,000 mg by mouth at bedtime.    Yes [provider]  atorvastatin (LIPITOR) 40 MG tablet Take 1 tablet (40 mg total) by mouth daily. 02/29/16  Yes Shawnee Knapp, MD  B Complex-C (SUPER B COMPLEX PO) Take 1 tablet by mouth daily.   Yes [provider]  BREO ELLIPTA 100-25 MCG/INH AEPB INHALE 1 PUFF INTO THE LUNGS DAILY 08/29/16  Yes Brand Males, MD  escitalopram (LEXAPRO) 20 MG tablet TAKE 1 TABLET(20 MG) BY MOUTH DAILY 10/12/16  Yes Shawnee Knapp, MD  feeding supplement, ENSURE ENLIVE, (ENSURE ENLIVE) LIQD Take 237 mLs by mouth 2 (two) times daily between meals. Patient taking differently: Take 237 mLs by mouth 3 (three) times daily between meals.  06/20/16  Yes Minus Liberty, MD  furosemide (LASIX) 20 MG tablet Take 1 tablet daily on Monday, Wednesday, and Friday. Patient taking differently: Take 20 mg by mouth every Monday, Wednesday, and Friday.  11/13/16  Yes Tanda Rockers, MD  HYDROcodone-acetaminophen (NORCO) 10-325 MG tablet Take 1-2 tablets by mouth every 6 (six) hours as needed. Patient taking differently: Take 0.5 tablets by mouth every 8 (eight) hours as needed for severe pain.  10/01/16  Yes Shawnee Knapp, MD  LORazepam (ATIVAN) 0.5 MG tablet Take 0.5 tablets (0.25 mg total) by mouth 2 (two) times daily. Patient taking differently: Take 0.25 mg by mouth 2 (two) times daily as needed for anxiety.  10/01/16  Yes Shawnee Knapp, MD  metoprolol succinate (TOPROL-XL) 50 MG 24  hr tablet Take 1 tablet (50 mg total) by mouth daily. with or immediately following a meal. 02/29/16  Yes Shawnee Knapp, MD  Multiple Vitamin (MULTIVITAMIN WITH MINERALS) TABS tablet Take 1 tablet by mouth daily. Patient taking differently: Take 1 tablet by mouth at bedtime.  06/21/16  Yes Minus Liberty, MD  omeprazole (PRILOSEC) 20 MG capsule Take 20 mg by mouth daily.   Yes [provider]  OXYGEN Inhale 5.5-8 L into the lungs continuous.    Yes [provider]  potassium chloride (K-DUR) 10 MEQ tablet 1 tab daily on Monday, Wednesday, and Friday. Patient taking differently: Take 10 mEq by mouth every Monday, Wednesday, and Friday.  11/13/16  Yes Tanda Rockers, MD  Macitentan (OPSUMIT) 10 MG TABS Take 10 mg by mouth daily.    [provider]  nystatin (MYCOSTATIN) 100000 UNIT/ML suspension Take 5  mLs (500,000 Units total) by mouth 4 (four) times daily. Patient not taking: Reported on 12/05/2016 10/16/16   Shawnee Knapp, MD    Family History Family History  Problem Relation Age of Onset  . Colon polyps Father   . Heart disease Father        Before age 84  . Stroke Father   . Deep vein thrombosis Father   . Vascular Disease Brother   . Alcoholism Paternal Grandfather   . Heart disease Paternal Grandmother   . Hypertension Mother   . Varicose Veins Mother   . Peripheral vascular disease Mother   . Osteoporosis Mother   . Heart disease Maternal Grandmother   . Colon cancer Neg Hx   . Esophageal cancer Neg Hx   . Rectal cancer Neg Hx   . Stomach cancer Neg Hx     Social History Social History  Substance Use Topics  . Smoking status: Former Smoker    Packs/day: 1.00    Years: 35.00    Types: Cigarettes    Quit date: 06/18/1996  . Smokeless tobacco: Never Used     Comment: Quit in 1998  . Alcohol use 4.2 - 8.4 oz/week    7 - 14 Standard drinks or equivalent per week     Comment: Occasional WINE     Allergies   Patient has no known  allergies.   Review of Systems Review of Systems  Constitutional: Positive for fatigue. Negative for chills and fever.  Respiratory: Positive for shortness of breath and wheezing. Negative for cough.   Cardiovascular: Positive for leg swelling. Negative for chest pain and palpitations.  Gastrointestinal: Positive for nausea. Negative for abdominal pain, blood in stool, diarrhea and vomiting.  Musculoskeletal: Negative for back pain, myalgias, neck pain and neck stiffness.  Skin: Positive for color change. Negative for rash and wound.  Neurological: Positive for weakness (generalized). Negative for dizziness, light-headedness, numbness and headaches.  All other systems reviewed and are negative.    Physical Exam Updated Vital Signs BP (!) 124/53 (BP Location: Left Arm)   Pulse 100   Temp 97.4 F (36.3 C) (Axillary)   Resp (!) 31   Ht _0  (1.499 m)   Wt 63 kg (138 lb 14.3 oz)   SpO2 91%   BMI 28.05 kg/m   Physical Exam  Constitutional: She is oriented to person, place, and time. She appears well-developed and well-nourished. She appears distressed.  Respiratory distress. Ashen appearance  HENT:  Head: Normocephalic and atraumatic.  Mouth/Throat: Oropharynx is clear and moist.  Eyes: EOM are normal. Pupils are equal, round, and reactive to light.  Neck: Normal range of motion. Neck supple.  Cardiovascular:  Tachycardia. Irregularly irregular  Pulmonary/Chest:  Increased respiratory effort. Diminished breath sounds in bilateral bases.  Abdominal: Soft. Bowel sounds are normal. There is no tenderness. There is no rebound and no guarding.  Musculoskeletal: Normal range of motion. She exhibits edema. She exhibits no tenderness.  Bilateral lower extremity edema  Neurological: She is alert and oriented to person, place, and time.  Appears to be moving all extremities without focal deficit. Sensation intact.  Skin: Skin is warm and dry. No rash noted. No erythema.   Psychiatric: She has a normal mood and affect. Her behavior is normal.  Nursing note and vitals reviewed.    ED Treatments / Results  Labs (all labs ordered are listed, but only abnormal results are displayed) Labs Reviewed  URINE CULTURE - Abnormal; Notable for the following:  Result Value   Culture >=100,000 COLONIES/mL STAPHYLOCOCCUS AUREUS (*)    Organism ID, Bacteria STAPHYLOCOCCUS AUREUS (*)    All other components within normal limits  BASIC METABOLIC PANEL - Abnormal; Notable for the following:    Potassium 5.3 (*)    Chloride 98 (*)    BUN 32 (*)    Creatinine, Ser 1.07 (*)    GFR calc non Af Amer 48 (*)    GFR calc Af Amer 56 (*)    Anion gap 20 (*)    All other components within normal limits  CBC - Abnormal; Notable for the following:    WBC 11.3 (*)    RBC 3.62 (*)    Hemoglobin 10.5 (*)    MCHC 29.1 (*)    RDW 16.1 (*)    All other components within normal limits  PROTIME-INR - Abnormal; Notable for the following:    Prothrombin Time 22.3 (*)    All other components within normal limits  HEPATIC FUNCTION PANEL - Abnormal; Notable for the following:    Total Protein 6.2 (*)    Albumin 3.4 (*)    AST 1,238 (*)    ALT 823 (*)    Alkaline Phosphatase 150 (*)    Total Bilirubin 2.2 (*)    Bilirubin, Direct 0.8 (*)    Indirect Bilirubin 1.4 (*)    All other components within normal limits  BRAIN NATRIURETIC PEPTIDE - Abnormal; Notable for the following:    B Natriuretic Peptide 1,106.7 (*)    All other components within normal limits  URINALYSIS, ROUTINE W REFLEX MICROSCOPIC - Abnormal; Notable for the following:    Color, Urine AMBER (*)    APPearance CLOUDY (*)    Glucose, UA 50 (*)    Hgb urine dipstick MODERATE (*)    Protein, ur >=300 (*)    Leukocytes, UA SMALL (*)    Bacteria, UA MANY (*)    Squamous Epithelial / LPF 0-5 (*)    Non Squamous Epithelial 0-5 (*)    All other components within normal limits  TROPONIN I - Abnormal; Notable for  the following:    Troponin I 0.90 (*)    All other components within normal limits  TROPONIN I - Abnormal; Notable for the following:    Troponin I 1.53 (*)    All other components within normal limits  BASIC METABOLIC PANEL - Abnormal; Notable for the following:    Chloride 100 (*)    Glucose, Bld 126 (*)    BUN 31 (*)    Creatinine, Ser 1.23 (*)    Calcium 8.4 (*)    GFR calc non Af Amer 41 (*)    GFR calc Af Amer 47 (*)    Anion gap 18 (*)    All other components within normal limits  CREATININE, SERUM - Abnormal; Notable for the following:    Creatinine, Ser 1.25 (*)    GFR calc non Af Amer 40 (*)    GFR calc Af Amer 46 (*)    All other components within normal limits  STREP PNEUMONIAE URINARY ANTIGEN - Abnormal; Notable for the following:    Strep Pneumo Urinary Antigen POSITIVE (*)    All other components within normal limits  AMMONIA - Abnormal; Notable for the following:    Ammonia 63 (*)    All other components within normal limits  COMPREHENSIVE METABOLIC PANEL - Abnormal; Notable for the following:    CO2 21 (*)    BUN 38 (*)  Creatinine, Ser 1.58 (*)    Calcium 8.1 (*)    Total Protein 5.4 (*)    Albumin 3.0 (*)    AST 4,890 (*)    ALT 2,759 (*)    Alkaline Phosphatase 154 (*)    Total Bilirubin 3.4 (*)    GFR calc non Af Amer 30 (*)    GFR calc Af Amer 35 (*)    All other components within normal limits  CBC - Abnormal; Notable for the following:    WBC 25.0 (*)    RBC 3.74 (*)    Hemoglobin 10.7 (*)    MCHC 28.8 (*)    RDW 16.3 (*)    Platelets 136 (*)    All other components within normal limits  PROTIME-INR - Abnormal; Notable for the following:    Prothrombin Time 52.4 (*)    INR 5.60 (*)    All other components within normal limits  APTT - Abnormal; Notable for the following:    aPTT 40 (*)    All other components within normal limits  BLOOD GAS, ARTERIAL - Abnormal; Notable for the following:    pH, Arterial 7.338 (*)    Acid-base deficit  3.0 (*)    All other components within normal limits  PHOSPHORUS - Abnormal; Notable for the following:    Phosphorus 5.9 (*)    All other components within normal limits  GLUCOSE, CAPILLARY - Abnormal; Notable for the following:    Glucose-Capillary 59 (*)    All other components within normal limits  GLUCOSE, CAPILLARY - Abnormal; Notable for the following:    Glucose-Capillary 103 (*)    All other components within normal limits  APTT - Abnormal; Notable for the following:    aPTT 43 (*)    All other components within normal limits  COOXEMETRY PANEL - Abnormal; Notable for the following:    Total hemoglobin 10.0 (*)    All other components within normal limits  ACETAMINOPHEN LEVEL - Abnormal; Notable for the following:    Acetaminophen (Tylenol), Serum <10 (*)    All other components within normal limits  MAGNESIUM - Abnormal; Notable for the following:    Magnesium 1.6 (*)    All other components within normal limits  CBC - Abnormal; Notable for the following:    WBC 22.3 (*)    RBC 3.60 (*)    Hemoglobin 10.2 (*)    HCT 35.1 (*)    MCHC 29.1 (*)    RDW 16.1 (*)    All other components within normal limits  COMPREHENSIVE METABOLIC PANEL - Abnormal; Notable for the following:    Glucose, Bld 123 (*)    BUN 37 (*)    Creatinine, Ser 1.62 (*)    Calcium 7.1 (*)    Total Protein 4.7 (*)    Albumin 2.6 (*)    AST 4,360 (*)    ALT 2,902 (*)    Alkaline Phosphatase 185 (*)    Total Bilirubin 4.0 (*)    GFR calc non Af Amer 29 (*)    GFR calc Af Amer 34 (*)    All other components within normal limits  APTT - Abnormal; Notable for the following:    aPTT 77 (*)    All other components within normal limits  BASIC METABOLIC PANEL - Abnormal; Notable for the following:    Glucose, Bld 109 (*)    BUN 40 (*)    Creatinine, Ser 1.62 (*)    Calcium 7.3 (*)  GFR calc non Af Amer 29 (*)    GFR calc Af Amer 34 (*)    All other components within normal limits  GLUCOSE,  CAPILLARY - Abnormal; Notable for the following:    Glucose-Capillary 102 (*)    All other components within normal limits  APTT - Abnormal; Notable for the following:    aPTT 153 (*)    All other components within normal limits  COOXEMETRY PANEL - Abnormal; Notable for the following:    Total hemoglobin 10.3 (*)    All other components within normal limits  GLUCOSE, CAPILLARY - Abnormal; Notable for the following:    Glucose-Capillary 105 (*)    All other components within normal limits  APTT - Abnormal; Notable for the following:    aPTT 147 (*)    All other components within normal limits  GLUCOSE, CAPILLARY - Abnormal; Notable for the following:    Glucose-Capillary 127 (*)    All other components within normal limits  CBC - Abnormal; Notable for the following:    WBC 19.4 (*)    RBC 3.53 (*)    Hemoglobin 10.0 (*)    HCT 33.6 (*)    MCHC 29.8 (*)    RDW 16.2 (*)    Platelets 143 (*)    All other components within normal limits  HEPARIN LEVEL (UNFRACTIONATED) - Abnormal; Notable for the following:    Heparin Unfractionated 0.29 (*)    All other components within normal limits  COMPREHENSIVE METABOLIC PANEL - Abnormal; Notable for the following:    Potassium 2.9 (*)    Glucose, Bld 158 (*)    BUN 35 (*)    Creatinine, Ser 1.37 (*)    Calcium 6.5 (*)    Total Protein 4.3 (*)    Albumin 2.4 (*)    AST 2,143 (*)    ALT 1,961 (*)    Alkaline Phosphatase 193 (*)    Total Bilirubin 5.4 (*)    GFR calc non Af Amer 36 (*)    GFR calc Af Amer 41 (*)    All other components within normal limits  BLOOD GAS, ARTERIAL - Abnormal; Notable for the following:    pO2, Arterial 80.8 (*)    All other components within normal limits  COOXEMETRY PANEL - Abnormal; Notable for the following:    Total hemoglobin 9.9 (*)    All other components within normal limits  GLUCOSE, CAPILLARY - Abnormal; Notable for the following:    Glucose-Capillary 188 (*)    All other components within  normal limits  GLUCOSE, CAPILLARY - Abnormal; Notable for the following:    Glucose-Capillary 147 (*)    All other components within normal limits  GLUCOSE, CAPILLARY - Abnormal; Notable for the following:    Glucose-Capillary 144 (*)    All other components within normal limits  BASIC METABOLIC PANEL - Abnormal; Notable for the following:    Potassium 2.8 (*)    Chloride 99 (*)    Glucose, Bld 206 (*)    BUN 35 (*)    Creatinine, Ser 1.38 (*)    Calcium 6.7 (*)    GFR calc non Af Amer 35 (*)    GFR calc Af Amer 41 (*)    All other components within normal limits  GLUCOSE, CAPILLARY - Abnormal; Notable for the following:    Glucose-Capillary 153 (*)    All other components within normal limits  GLUCOSE, CAPILLARY - Abnormal; Notable for the following:    Glucose-Capillary  169 (*)    All other components within normal limits  CBC - Abnormal; Notable for the following:    WBC 20.4 (*)    RBC 3.62 (*)    Hemoglobin 10.3 (*)    HCT 33.7 (*)    RDW 16.1 (*)    Platelets 128 (*)    All other components within normal limits  COOXEMETRY PANEL - Abnormal; Notable for the following:    Total hemoglobin 10.5 (*)    All other components within normal limits  COMPREHENSIVE METABOLIC PANEL - Abnormal; Notable for the following:    Chloride 97 (*)    Glucose, Bld 158 (*)    BUN 35 (*)    Creatinine, Ser 1.38 (*)    Calcium 6.7 (*)    Total Protein 4.5 (*)    Albumin 2.3 (*)    AST 1,104 (*)    ALT 1,463 (*)    Alkaline Phosphatase 208 (*)    Total Bilirubin 7.1 (*)    GFR calc non Af Amer 35 (*)    GFR calc Af Amer 41 (*)    All other components within normal limits  GLUCOSE, CAPILLARY - Abnormal; Notable for the following:    Glucose-Capillary 173 (*)    All other components within normal limits  GLUCOSE, CAPILLARY - Abnormal; Notable for the following:    Glucose-Capillary 155 (*)    All other components within normal limits  GLUCOSE, CAPILLARY - Abnormal; Notable for the  following:    Glucose-Capillary 149 (*)    All other components within normal limits  GLUCOSE, CAPILLARY - Abnormal; Notable for the following:    Glucose-Capillary 139 (*)    All other components within normal limits  COOXEMETRY PANEL - Abnormal; Notable for the following:    Total hemoglobin 10.7 (*)    All other components within normal limits  GLUCOSE, CAPILLARY - Abnormal; Notable for the following:    Glucose-Capillary 137 (*)    All other components within normal limits  GLUCOSE, CAPILLARY - Abnormal; Notable for the following:    Glucose-Capillary 160 (*)    All other components within normal limits  GLUCOSE, CAPILLARY - Abnormal; Notable for the following:    Glucose-Capillary 141 (*)    All other components within normal limits  CBC - Abnormal; Notable for the following:    WBC 21.7 (*)    RBC 3.70 (*)    Hemoglobin 10.6 (*)    HCT 33.7 (*)    RDW 16.4 (*)    Platelets 70 (*)    All other components within normal limits  COOXEMETRY PANEL - Abnormal; Notable for the following:    Total hemoglobin 10.8 (*)    All other components within normal limits  COMPREHENSIVE METABOLIC PANEL - Abnormal; Notable for the following:    Potassium 3.2 (*)    Chloride 93 (*)    CO2 33 (*)    Glucose, Bld 153 (*)    BUN 43 (*)    Creatinine, Ser 1.34 (*)    Calcium 7.1 (*)    Total Protein 4.5 (*)    Albumin 2.1 (*)    AST 505 (*)    ALT 993 (*)    Alkaline Phosphatase 229 (*)    Total Bilirubin 8.3 (*)    GFR calc non Af Amer 37 (*)    GFR calc Af Amer 42 (*)    All other components within normal limits  GLUCOSE, CAPILLARY - Abnormal; Notable for the following:  Glucose-Capillary 124 (*)    All other components within normal limits  GLUCOSE, CAPILLARY - Abnormal; Notable for the following:    Glucose-Capillary 139 (*)    All other components within normal limits  PROTIME-INR - Abnormal; Notable for the following:    Prothrombin Time 24.9 (*)    All other components  within normal limits  APTT - Abnormal; Notable for the following:    aPTT 191 (*)    All other components within normal limits  GLUCOSE, CAPILLARY - Abnormal; Notable for the following:    Glucose-Capillary 167 (*)    All other components within normal limits  COOXEMETRY PANEL - Abnormal; Notable for the following:    Total hemoglobin 10.7 (*)    All other components within normal limits  GLUCOSE, CAPILLARY - Abnormal; Notable for the following:    Glucose-Capillary 135 (*)    All other components within normal limits  GLUCOSE, CAPILLARY - Abnormal; Notable for the following:    Glucose-Capillary 188 (*)    All other components within normal limits  APTT - Abnormal; Notable for the following:    aPTT 109 (*)    All other components within normal limits  APTT - Abnormal; Notable for the following:    aPTT 55 (*)    All other components within normal limits  COOXEMETRY PANEL - Abnormal; Notable for the following:    Total hemoglobin 10.8 (*)    Carboxyhemoglobin 1.8 (*)    All other components within normal limits  APTT - Abnormal; Notable for the following:    aPTT 73 (*)    All other components within normal limits  GLUCOSE, CAPILLARY - Abnormal; Notable for the following:    Glucose-Capillary 139 (*)    All other components within normal limits  GLUCOSE, CAPILLARY - Abnormal; Notable for the following:    Glucose-Capillary 111 (*)    All other components within normal limits  APTT - Abnormal; Notable for the following:    aPTT 74 (*)    All other components within normal limits  GLUCOSE, CAPILLARY - Abnormal; Notable for the following:    Glucose-Capillary 104 (*)    All other components within normal limits  CBC - Abnormal; Notable for the following:    WBC 20.8 (*)    RBC 3.73 (*)    Hemoglobin 10.6 (*)    HCT 33.8 (*)    RDW 16.8 (*)    Platelets 71 (*)    All other components within normal limits  COMPREHENSIVE METABOLIC PANEL - Abnormal; Notable for the  following:    Chloride 93 (*)    CO2 33 (*)    BUN 52 (*)    Creatinine, Ser 1.34 (*)    Calcium 8.1 (*)    Total Protein 4.8 (*)    Albumin 2.2 (*)    AST 280 (*)    ALT 721 (*)    Alkaline Phosphatase 240 (*)    Total Bilirubin 10.5 (*)    GFR calc non Af Amer 37 (*)    GFR calc Af Amer 42 (*)    All other components within normal limits  GLUCOSE, CAPILLARY - Abnormal; Notable for the following:    Glucose-Capillary 51 (*)    All other components within normal limits  CBC - Abnormal; Notable for the following:    WBC 17.7 (*)    RBC 3.45 (*)    Hemoglobin 9.8 (*)    HCT 31.5 (*)    RDW 17.4 (*)  Platelets 40 (*)    All other components within normal limits  COOXEMETRY PANEL - Abnormal; Notable for the following:    Total hemoglobin 10.0 (*)    Carboxyhemoglobin 1.7 (*)    All other components within normal limits  COMPREHENSIVE METABOLIC PANEL - Abnormal; Notable for the following:    Chloride 95 (*)    Glucose, Bld 116 (*)    BUN 68 (*)    Creatinine, Ser 1.65 (*)    Calcium 8.2 (*)    Total Protein 4.5 (*)    Albumin 2.0 (*)    AST 176 (*)    ALT 458 (*)    Alkaline Phosphatase 210 (*)    Total Bilirubin 12.7 (*)    GFR calc non Af Amer 28 (*)    GFR calc Af Amer 33 (*)    All other components within normal limits  MAGNESIUM - Abnormal; Notable for the following:    Magnesium 1.5 (*)    All other components within normal limits  PHOSPHORUS - Abnormal; Notable for the following:    Phosphorus 4.9 (*)    All other components within normal limits  GLUCOSE, CAPILLARY - Abnormal; Notable for the following:    Glucose-Capillary 128 (*)    All other components within normal limits  APTT - Abnormal; Notable for the following:    aPTT 91 (*)    All other components within normal limits  GLUCOSE, CAPILLARY - Abnormal; Notable for the following:    Glucose-Capillary 198 (*)    All other components within normal limits  GLUCOSE, CAPILLARY - Abnormal; Notable for  the following:    Glucose-Capillary 112 (*)    All other components within normal limits  I-STAT TROPOININ, ED - Abnormal; Notable for the following:    Troponin i, poc 0.46 (*)    All other components within normal limits  CBG MONITORING, ED - Abnormal; Notable for the following:    Glucose-Capillary 42 (*)    All other components within normal limits  CBG MONITORING, ED - Abnormal; Notable for the following:    Glucose-Capillary 201 (*)    All other components within normal limits  I-STAT ARTERIAL BLOOD GAS, ED - Abnormal; Notable for the following:    pH, Arterial 7.266 (*)    pCO2 arterial 51.9 (*)    pO2, Arterial 33.0 (*)    Acid-base deficit 4.0 (*)    All other components within normal limits  I-STAT ARTERIAL BLOOD GAS, ED - Abnormal; Notable for the following:    pH, Arterial 7.225 (*)    pCO2 arterial 61.8 (*)    pO2, Arterial 22.0 (*)    Acid-base deficit 3.0 (*)    All other components within normal limits  CBG MONITORING, ED - Abnormal; Notable for the following:    Glucose-Capillary 112 (*)    All other components within normal limits  POCT I-STAT 3, ART BLOOD GAS (G3+) - Abnormal; Notable for the following:    pH, Arterial 7.338 (*)    pO2, Arterial 169.0 (*)    All other components within normal limits  POCT I-STAT 3, ART BLOOD GAS (G3+) - Abnormal; Notable for the following:    pO2, Arterial 68.0 (*)    All other components within normal limits  CULTURE, BLOOD (ROUTINE X 2)  CULTURE, BLOOD (ROUTINE X 2)  MRSA PCR SCREENING  CULTURE, RESPIRATORY (NON-EXPECTORATED)  LIPASE, BLOOD  PROCALCITONIN  MAGNESIUM  PROCALCITONIN  GLUCOSE, CAPILLARY  GLUCOSE, CAPILLARY  GLUCOSE, CAPILLARY  HEPATITIS PANEL, ACUTE  GLUCOSE, CAPILLARY  HEPARIN LEVEL (UNFRACTIONATED)  HEPARIN LEVEL (UNFRACTIONATED)  CORTISOL  SALICYLATE LEVEL  PROCALCITONIN  PHOSPHORUS  HEPARIN LEVEL (UNFRACTIONATED)  GLUCOSE, CAPILLARY  GLUCOSE, CAPILLARY  MAGNESIUM  GLUCOSE, CAPILLARY   GLUCOSE, CAPILLARY  HEPARIN LEVEL (UNFRACTIONATED)  MAGNESIUM  MAGNESIUM  HEPARIN LEVEL (UNFRACTIONATED)  HEPARIN LEVEL (UNFRACTIONATED)  HEPARIN INDUCED PLATELET AB (HIT ANTIBODY)  SEROTONIN RELEASE ASSAY (SRA)  GLUCOSE, CAPILLARY  GLUCOSE, CAPILLARY  GLUCOSE, CAPILLARY  GLUCOSE, CAPILLARY  GLUCOSE, CAPILLARY    EKG  EKG Interpretation  Date/Time:  Sunday November 18 2016 13:36:17 EDT Ventricular Rate:  182 PR Interval:    QRS Duration: 85 QT Interval:  238 QTC Calculation: 415 R Axis:   144 Text Interpretation:  Supraventricular tachycardia Probable RVH w/ secondary repol abnormality Nonspecific T abnormalities, lateral leads Confirmed by Addison Lank [phone]) on 11/19/2016 11:52:48 PM       Radiology No results found.  Procedures Procedure Name: Intubation Date/Time: 12/10/2016 2:00 PM Performed by: Lita Mains, Avyonna Wagoner Preoxygenation: Pre-oxygenation with 100% oxygen Ventilation: Mask ventilation with difficulty Laryngoscope Size: Glidescope and 3 Tube size: 7.5 mm Number of attempts: 2 Placement Confirmation: ETT inserted through vocal cords under direct vision,  Positive ETCO2,  CO2 detector and Breath sounds checked- equal and bilateral Tube secured with: ETT holder Difficulty Due To: Difficult Airway- due to anterior larynx      (including critical care time)  Medications Ordered in ED Medications  LORazepam (ATIVAN) 2 MG/ML injection (not administered)  diltiazem (CARDIZEM) 100 mg in dextrose 5 % 100 mL (1 mg/mL) infusion (0 mg/hr Intravenous Stopped 11/27/2016 0840)  sodium chloride 0.9 % bolus 500 mL (0 mLs Intravenous Stopped 12/03/2016 0904)  metoprolol tartrate (LOPRESSOR) injection 2.5 mg (2.5 mg Intravenous Given 11/26/2016 1008)  sodium chloride 0.9 % bolus 250 mL (0 mLs Intravenous Stopped 11/26/2016 1121)  metoprolol tartrate (LOPRESSOR) injection 2.5 mg (2.5 mg Intravenous Given 11/27/2016 1120)  ondansetron (ZOFRAN) 4 MG/2ML injection (4 mg  Given 12/13/2016 1234)   dextrose 50 % solution (  Given 11/17/2016 1310)  EPINEPHrine (ADRENALIN) 1 MG/10ML injection (1 mg Intravenous Given 11/19/2016 1316)  etomidate (AMIDATE) injection (20 mg Intravenous Given 12/15/2016 1342)  succinylcholine (ANECTINE) injection (100 mg Intravenous Given 11/30/2016 1342)  norepinephrine (LEVOPHED) 4 mg in dextrose 5 % 250 mL (0.016 mg/mL) infusion (0 mcg/min Intravenous Stopped 12/06/2016 1900)  fentaNYL (SUBLIMAZE) 100 MCG/2ML injection (  Duplicate 5/0/03 7048)  dextrose 50 % solution 25 mL (25 mLs Intravenous Given 11/29/2016 2341)  midazolam (VERSED) injection 2 mg (2 mg Intravenous Given 12/04/2016 2350)  furosemide (LASIX) injection 60 mg (60 mg Intravenous Given 11/19/16 0300)  diltiazem (CARDIZEM) 100 mg in dextrose 5 % 100 mL (1 mg/mL) infusion (0 mg/hr Intravenous Stopped 11/19/16 0945)  digoxin (LANOXIN) 0.25 MG/ML injection 0.5 mg (0.5 mg Intravenous Given 11/19/16 0432)  amiodarone (NEXTERONE) 1.8 mg/mL load via infusion 150 mg (150 mg Intravenous Bolus from Bag 11/19/16 2212)  sodium chloride 0.9 % bolus 500 mL (0 mLs Intravenous Stopped 11/20/16 1321)  magnesium sulfate IVPB 4 g 100 mL (0 g Intravenous Stopped 11/20/16 1142)  potassium chloride 20 MEQ/15ML (10%) solution 40 mEq (40 mEq Oral Given 11/21/16 0541)  potassium chloride 10 mEq in 50 mL *CENTRAL LINE* IVPB (0 mEq Intravenous Stopped 11/21/16 0747)  magnesium sulfate IVPB 2 g 50 mL (0 g Intravenous Stopped 11/21/16 0924)  furosemide (LASIX) injection 80 mg (80 mg Intravenous Given 11/21/16 0824)  furosemide (LASIX) 10 MG/ML injection (  Duplicate  11/21/16 0845)  potassium chloride 10 mEq in 50 mL *CENTRAL LINE* IVPB (0 mEq Intravenous Stopped 11/22/16 0055)  furosemide (LASIX) injection 40 mg (40 mg Intravenous Given 11/22/16 1000)  potassium chloride 20 MEQ/15ML (10%) solution 40 mEq (40 mEq Oral Given 11/22/16 1022)  potassium chloride 20 MEQ/15ML (10%) solution 40 mEq (40 mEq Per Tube Given 11/23/16 0851)  furosemide (LASIX) injection 40 mg (40 mg  Intravenous Given 11/23/16 0851)  furosemide (LASIX) injection 60 mg (60 mg Intravenous Given 11/24/16 1035)  dextrose 50 % solution (  Duplicate 09/16/12 6431)  dextrose 50 % solution 25 mL (25 mLs Intravenous Given 11/24/16 1215)  furosemide (LASIX) injection 40 mg (40 mg Intravenous Given 11/29/2016 0551)  furosemide (LASIX) 120 mg in dextrose 5 % 50 mL IVPB (0 mg Intravenous Stopped 11-29-2016 0942)  morphine 4 MG/ML injection 1 mg (1 mg Intravenous Given 11-29-2016 0849)   CRITICAL CARE Performed by: Lita Mains, Lenus Trauger Total critical care time: 70 minutes Critical care time was exclusive of separately billable procedures and treating other patients. Critical care was necessary to treat or prevent imminent or life-threatening deterioration. Critical care was time spent personally by me on the following activities: development of treatment plan with patient and/or surrogate as well as nursing, discussions with consultants, evaluation of patient's response to treatment, examination of patient, obtaining history from patient or surrogate, ordering and performing treatments and interventions, ordering and review of laboratory studies, ordering and review of radiographic studies, pulse oximetry and re-evaluation of patient's condition.  Initial Impression / Assessment and Plan / ED Course  I have reviewed the triage vital signs and the nursing notes.  Pertinent labs & imaging results that were available during my care of the patient were reviewed by me and considered in my medical decision making (see chart for details).     Patient received albuterol breathing treatment with some improvement in her symptoms. Still requiring supplemental oxygen to maintain saturations. Initially started on Cardizem for Afib and RVR but blood pressure did not support it. Consulted cardiology. Advised using low-dose metoprolol for control rate. Pads were placed if emergent cardioversion necessary. Discussed with internal medicine  service for admission to stepdown bed. Patient became unresponsive with seizure-like episodes. Found to be hypoglycemic and was given D50. Decision made to intubate patient for airway protection. Patient intubated and critical care consultation. Left-sided IJ was placed by emergency department resident. Final Clinical Impressions(s) / ED Diagnoses   Final diagnoses:  Pleural effusion    New Prescriptions Discharge Medication List as of 11/29/16  1:31 PM       Julianne Rice, MD 12/08/16 2309

## 2016-12-16 NOTE — Progress Notes (Signed)
ANTICOAGULATION CONSULT NOTE - Follow Up Consult  Pharmacy Consult for Bivalirudin  Indication: atrial fibrillation, r/o HIT  No Known Allergies  Patient Measurements: Height: 4' 11" (149.9 cm) Weight: 138 lb 14.4 oz (63 kg) IBW/kg (Calculated) : 43.2 Heparin Dosing Weight: 58 kg  Vital Signs: Temp: 97.4 F (36.3 C) (06/10 0736) Temp Source: Axillary (06/10 0736) BP: 124/57 (06/10 0800) Pulse Rate: 30 (06/10 0600)  Labs:  Recent Labs  11/23/16 0341 11/23/16 0342 11/23/16 0840  11/23/16 2228 11/24/16 0531 11/24/16 0600 2016/12/11 0400 12-11-2016 0630  HGB 10.6*  --   --   --   --   --  10.6* 9.8*  --   HCT 33.7*  --   --   --   --   --  33.8* 31.5*  --   PLT 70*  --   --   --   --   --  71* 40*  --   APTT  --   --  191*  < > 74* 73*  --   --  91*  LABPROT  --   --  24.9*  --   --   --   --   --   --   INR  --   --  2.21  --   --   --   --   --   --   HEPARINUNFRC  --  0.31  --   --   --   --   --   --   --   CREATININE 1.34*  --   --   --   --   --  1.34* 1.65*  --   < > = values in this interval not displayed.  Estimated Creatinine Clearance: 22.3 mL/min (A) (by C-G formula based on SCr of 1.65 mg/dL (H)).   Medications: Bivalirudin @ 0.05 mg/kghr  Assessment: 81 yo female with hx afib, not on chronic anticoagulation.  CHADSVASC at least 5.  Eliquis given 6/3 (1 dose).  Pharmacist requested to hold Eliquis given drastically elevated LFTs.  Started anticoagulation with IV heparin on 6/4.   Today platelet count down significantly to 40, hgb stable.  No specific suspected thrombosis, but HIT is certainly a consideration. Aptt above goal at 91 will adjust. No bleeding issues noted  Goal of Therapy:  Goal aPTT 50-85 seconds Monitor platelets by anticoagulation protocol: Yes   Plan:  -Reduce IV bivalirudin at 0.04 mg/kg/hr -Daily PTT, CBC. -F/u HIT antibody and SRA.  Erin Hearing PharmD., BCPS Clinical Pharmacist Pager [phone] 12-11-2016 8:12 AM

## 2016-12-16 NOTE — Progress Notes (Signed)
While preparing to intubate the pt she went into PEA arrest. As per discussion with the family, palliative care we did not do CPR or resuscitate. She passed away around 10:10 AM. Son [name] called and informed over the phone.  Marshell Garfinkel MD [facility] Pager [phone] If no answer or after 3pm call: [phone] 2016/12/02, 10:59 AM

## 2016-12-16 DEATH — deceased

## 2017-01-11 ENCOUNTER — Ambulatory Visit: Payer: Medicare Other | Admitting: Internal Medicine

## 2017-02-25 ENCOUNTER — Encounter: Payer: Medicare Other | Admitting: Family Medicine

## 2017-09-25 IMAGING — US US ABDOMEN LIMITED
1 series · 14 of 25 positions shown · non-contrast
Comparison: CT scan the abdomen pelvis of January 01, 2014

CLINICAL DATA: Transaminitis

EXAM:
US ABDOMEN LIMITED - RIGHT UPPER QUADRANT

[Series 1: us abdomen limited · 0.22mm/px · 14 of 30 slices shown]
[im 1/30]
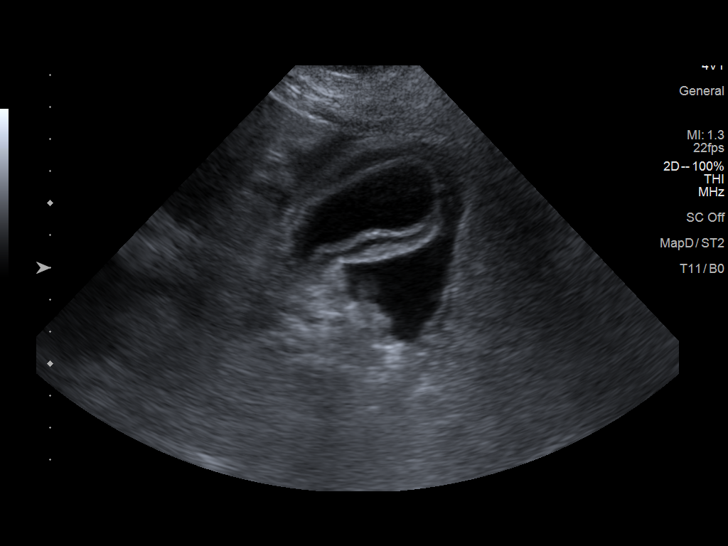
[im 3/30]
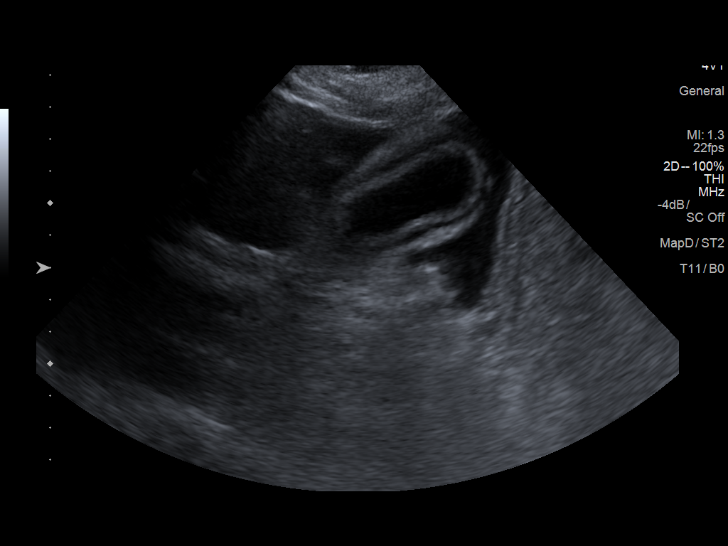
[im 5/30]
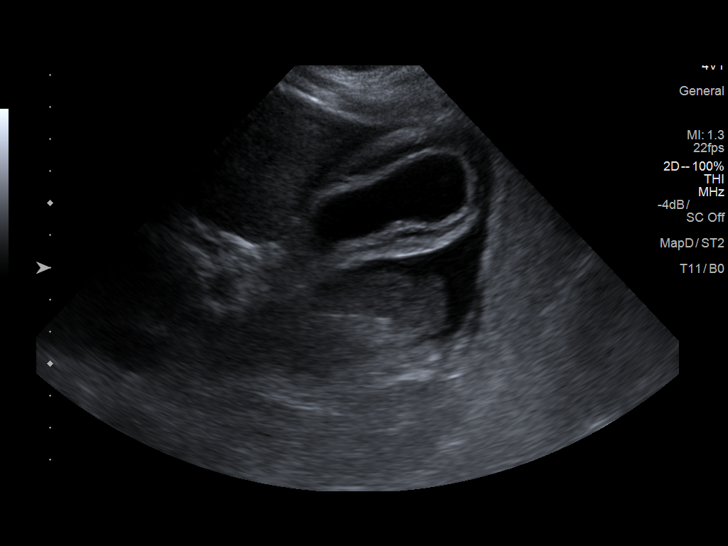
[im 8/30]
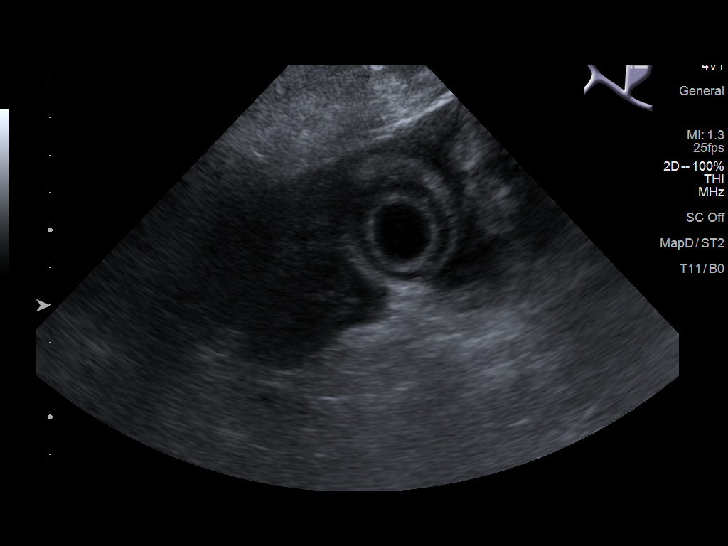
[im 10/30]
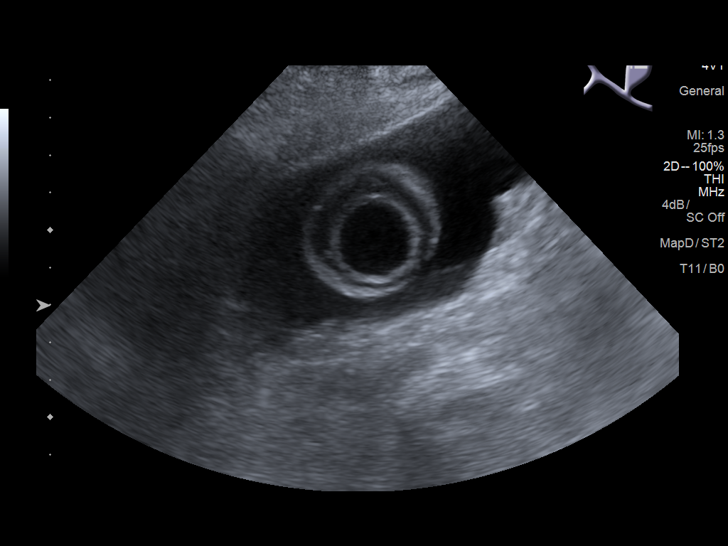
[im 11/30]
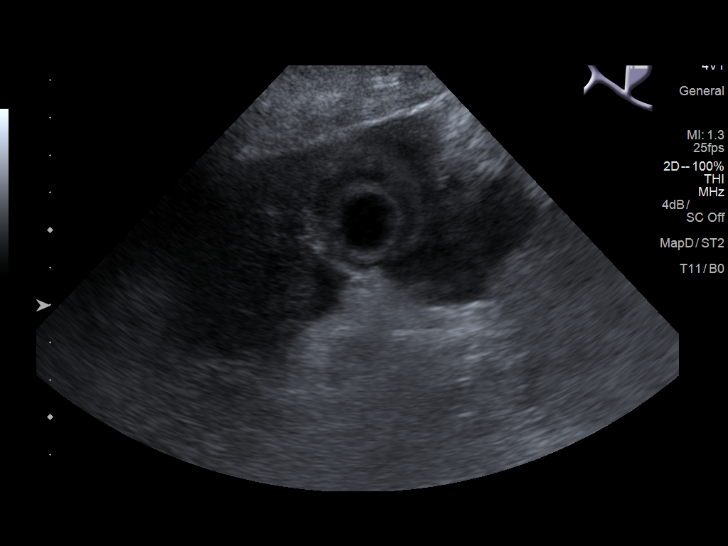
[im 14/30]
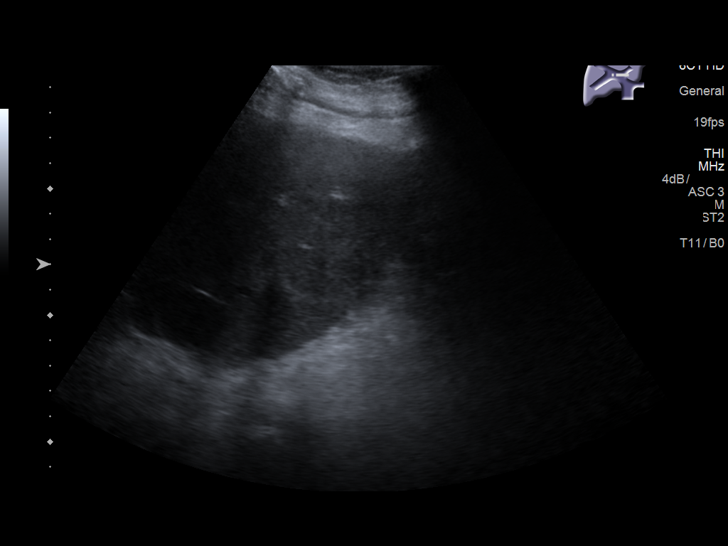
[im 16/30]
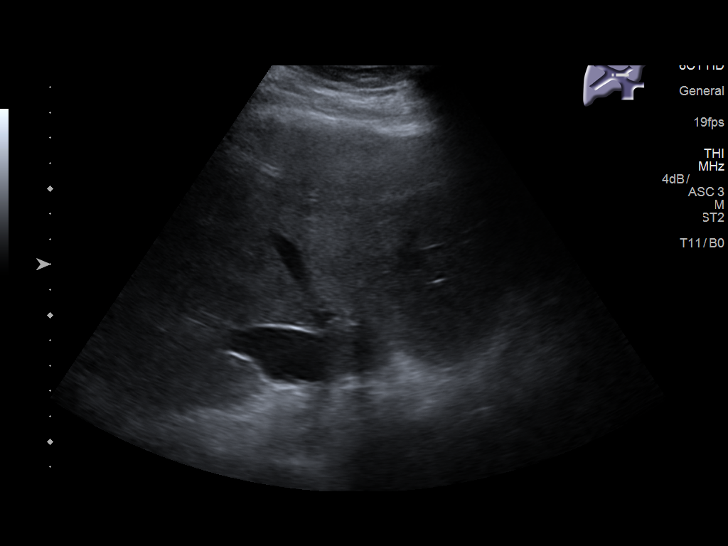
[im 19/30]
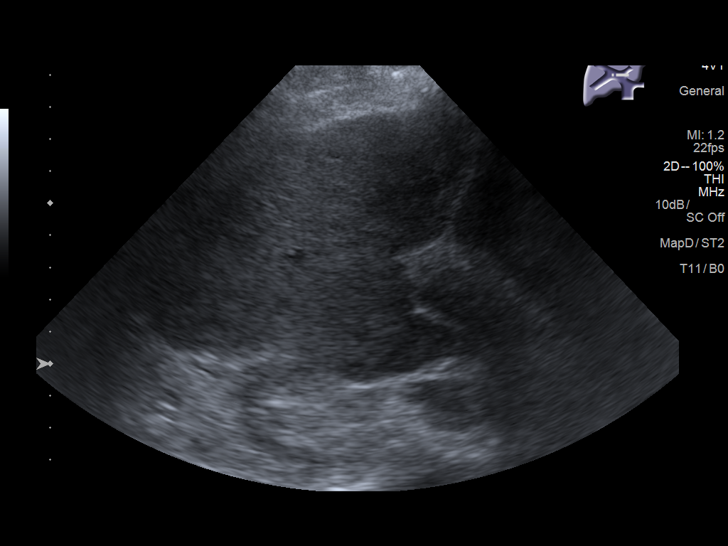
[im 20/30]
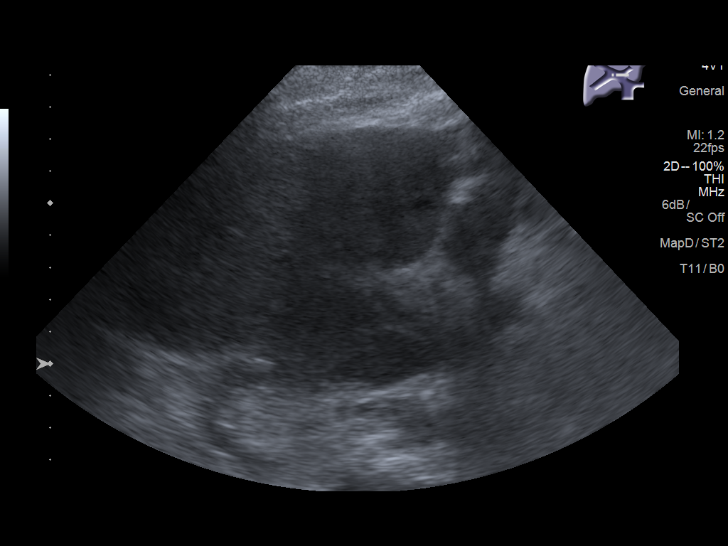
[im 22/30]
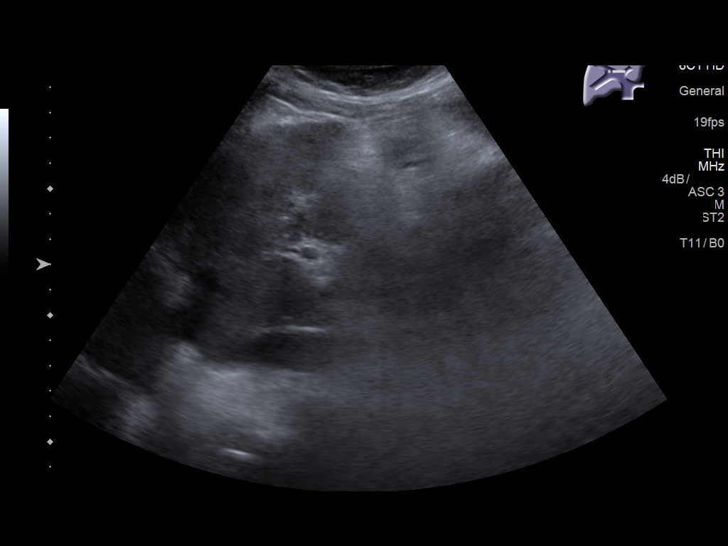
[im 25/30]
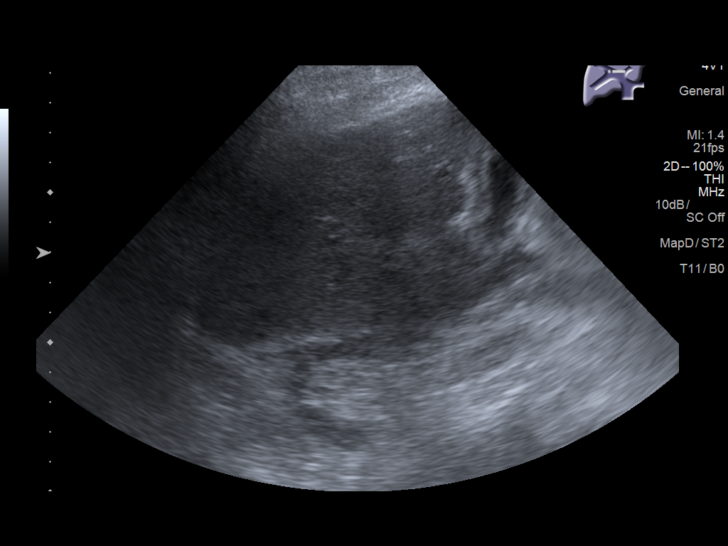
[im 27/30]
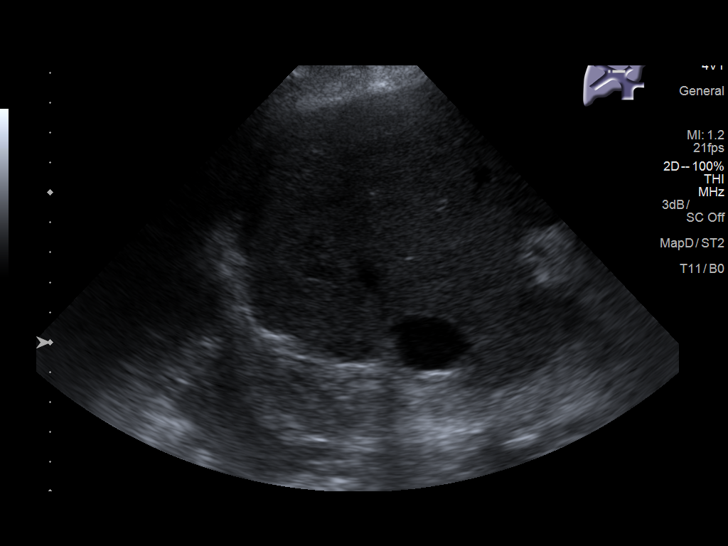
[im 30/30]
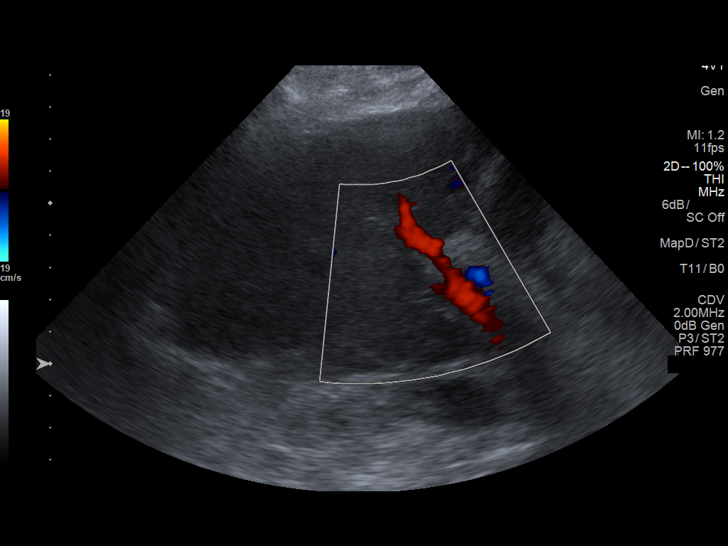

[14 of 25 positions shown; findings below may reference images not displayed]

FINDINGS: The study was done portably. The patient is intubated.
Defibrillation pads limit the acoustical window.

Gallbladder:

The gallbladder is adequately distended. There is gallbladder wall
thickening to 11.6 mm. There is pericholecystic fluid. There is no
positive sonographic Murphy's sign. No stones or sludge are evident.

Common bile duct:

Diameter: 3.8 mm

Liver:

The hepatic echotexture is heterogeneous. There is no focal mass nor
ductal dilation.
IMPRESSION: Gallbladder wall thickening and pericholecystic fluid may reflect
acalculous cholecystitis.

Heterogeneous hepatic echotexture is nonspecific. Normal appearing
common bile duct.

## 2018-06-27 IMAGING — DX DG CHEST 1V PORT
1 series · 1 of 1 positions shown · non-contrast
Comparison: 11/18/2016

CLINICAL DATA: Central line placement

EXAM:
PORTABLE CHEST 1 VIEW

[chest]
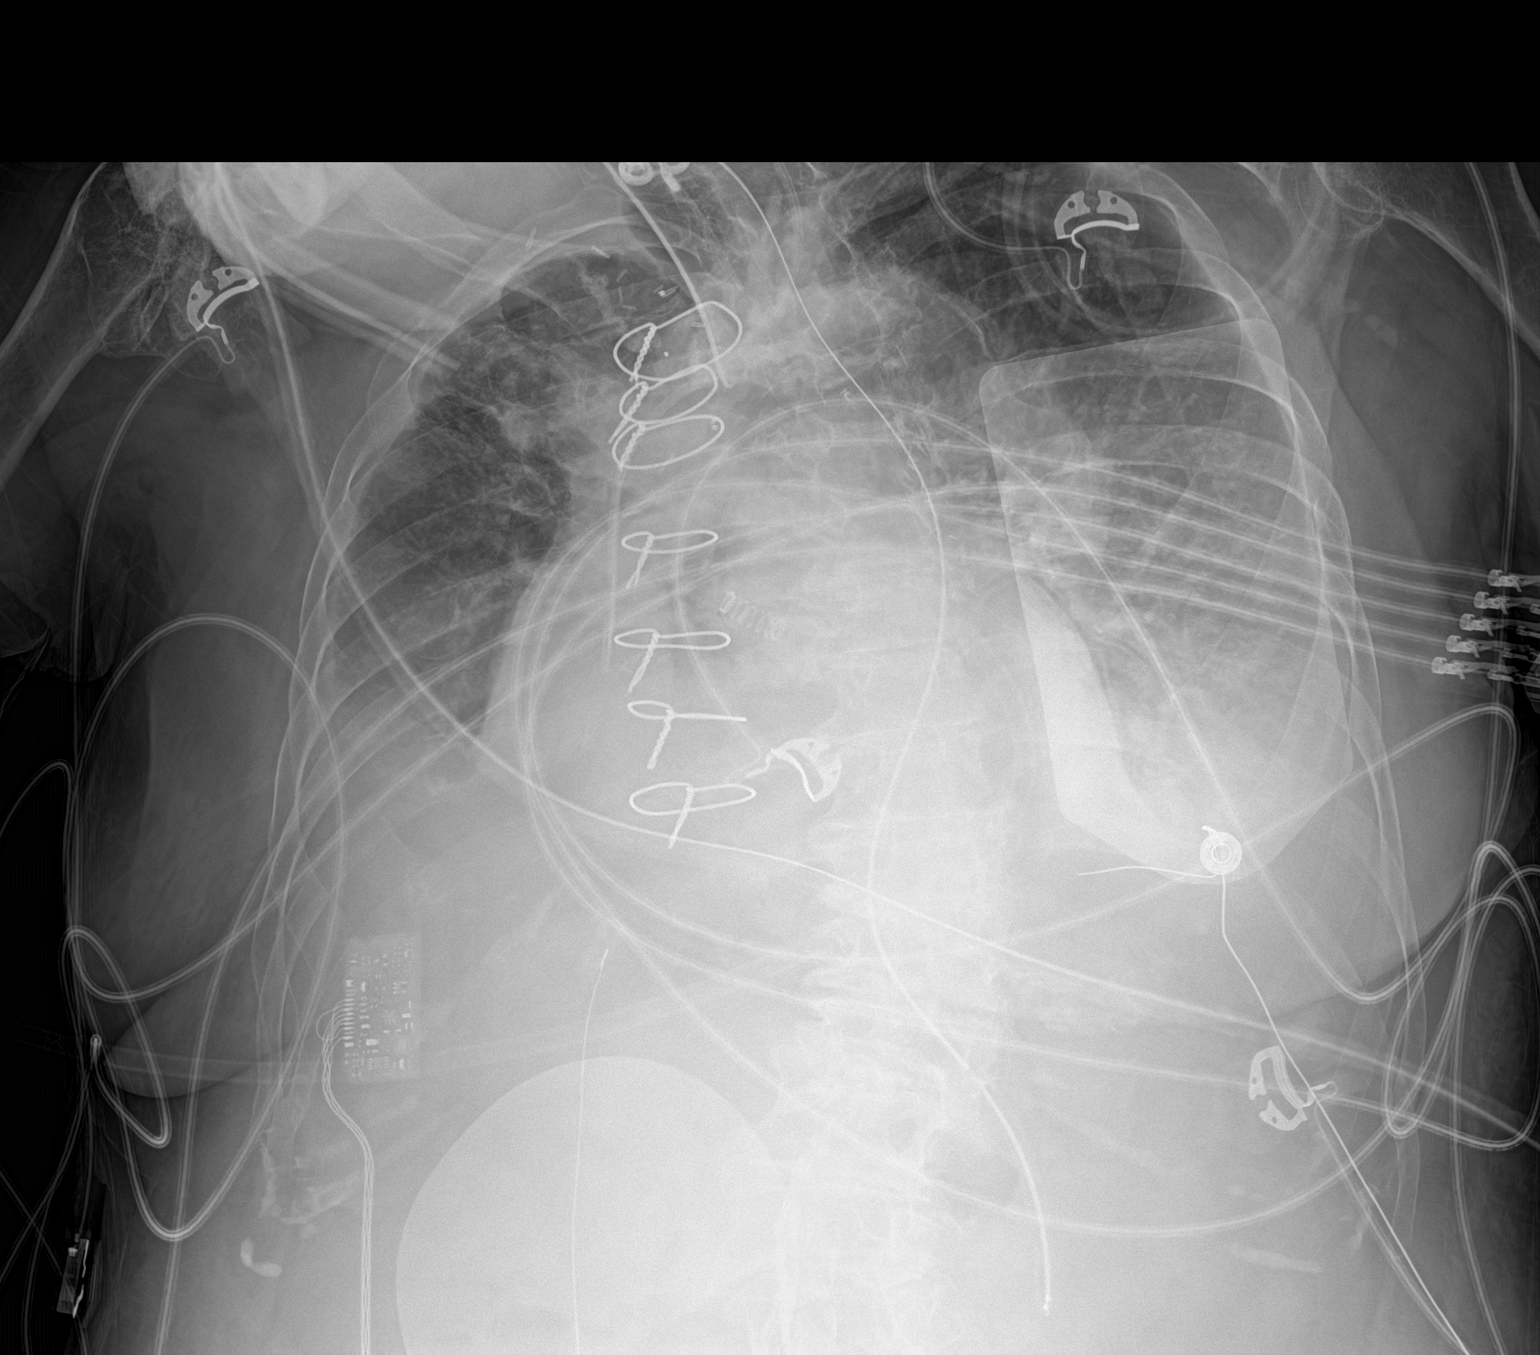

[1 of 1 positions shown; findings below may reference images not displayed]

FINDINGS: Left central line tip is in the upper right atrium. No pneumothorax.
Cardiomegaly. Bilateral layering effusions and bilateral airspace
disease, likely edema/ CHF. NG tube and endotracheal tube are
unchanged. Left central line tip in the upper right atrium. No
pneumothorax.
IMPRESSION: Continued bilateral airspace disease and layering effusions, likely
edema/CHF.

## 2018-06-27 IMAGING — CR DG CHEST 1V PORT
1 series · 1 of 1 positions shown · non-contrast
Comparison: Chest radiograph 11/18/2016

CLINICAL DATA: Patient status post intubation.

EXAM:
PORTABLE CHEST 1 VIEW

[AP]
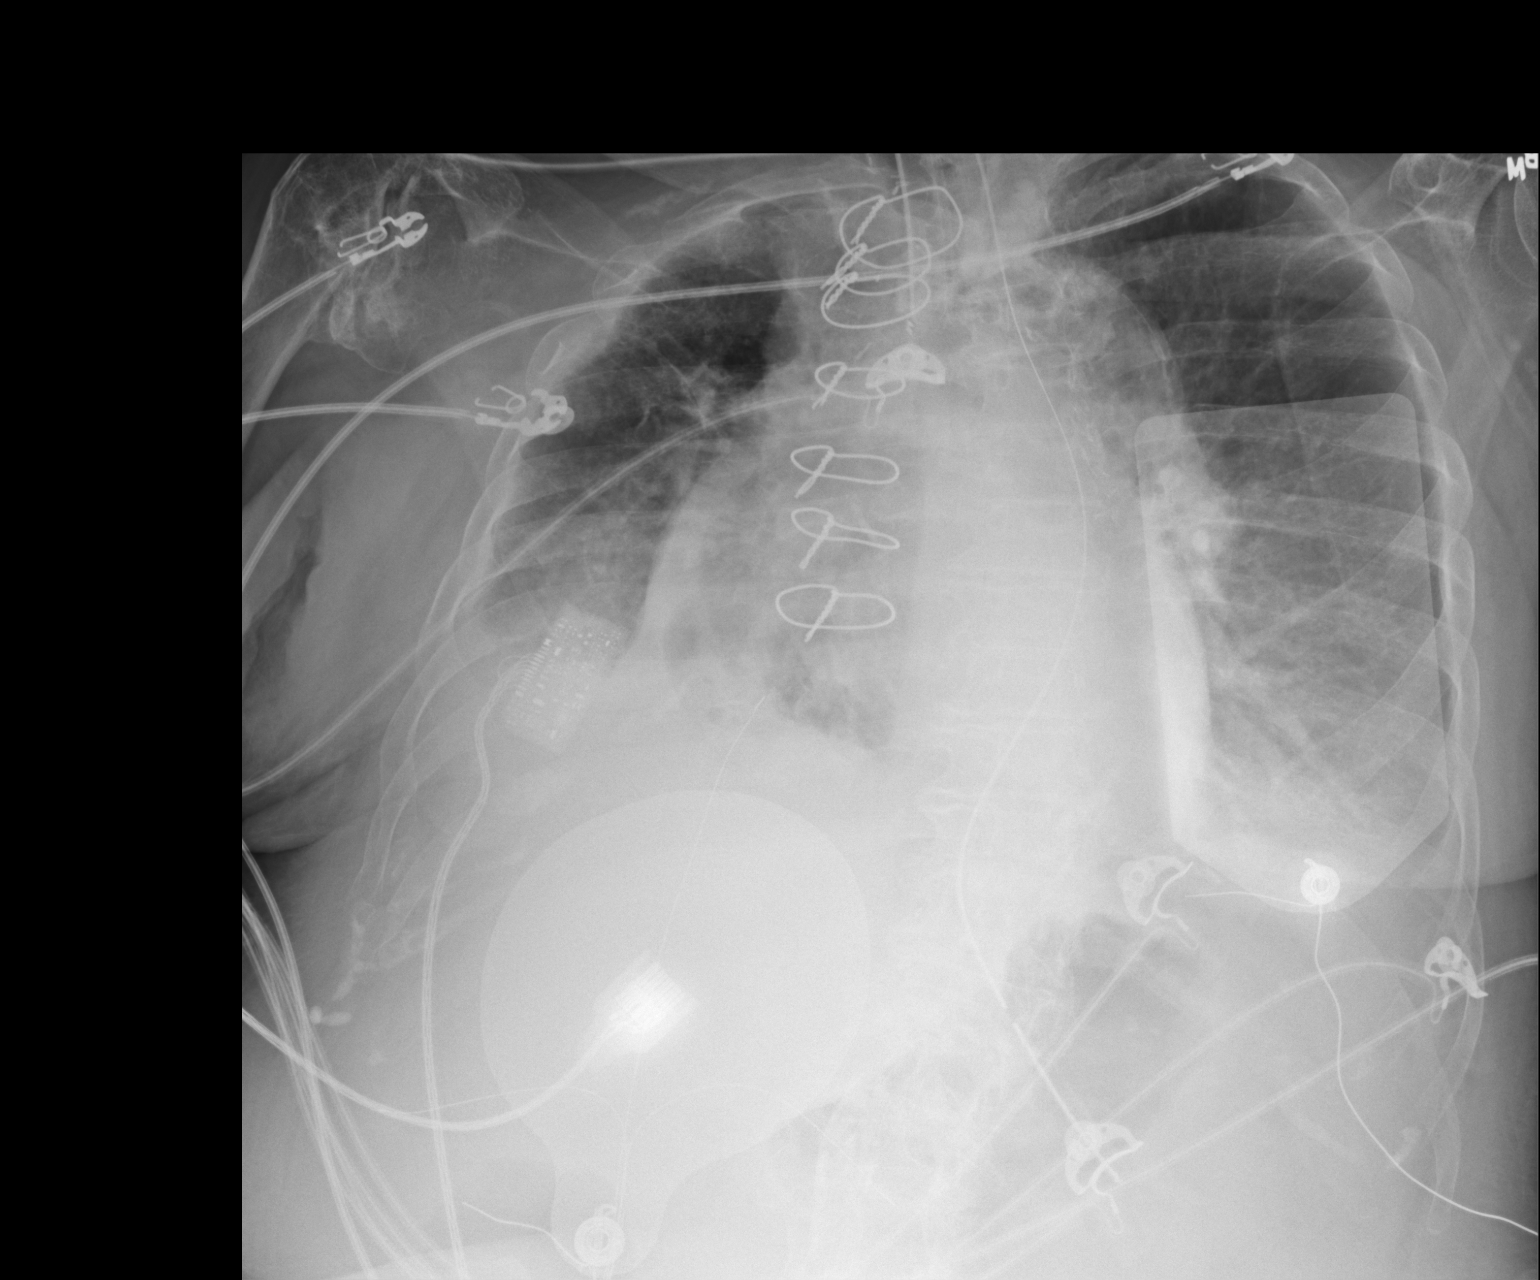

[1 of 1 positions shown; findings below may reference images not displayed]

FINDINGS: ET tube terminates in the mid trachea. Enteric tube tip and
side-port project over the stomach. Pacer apparatus overlies the
left hemithorax. Patient status post median sternotomy. Low lung
volumes. Persistent moderate right and small left pleural effusions
with underlying pulmonary consolidation which appears worsened
within the right greater than left mid and lower lungs. No
pneumothorax. Osseous destruction right humeral head.
IMPRESSION: ET tube terminates in the mid trachea.

Worsening moderate right and small left pleural effusions with
increasing bilateral underlying heterogeneous pulmonary opacities
which may represent atelectasis or infection.

## 2018-06-27 IMAGING — CR DG CHEST 1V PORT
1 series · 1 of 1 positions shown · non-contrast
Comparison: 06/20/2016 and prior exams

CLINICAL DATA: Shortness of breath for 1 day.

EXAM:
PORTABLE CHEST 1 VIEW

[AP]
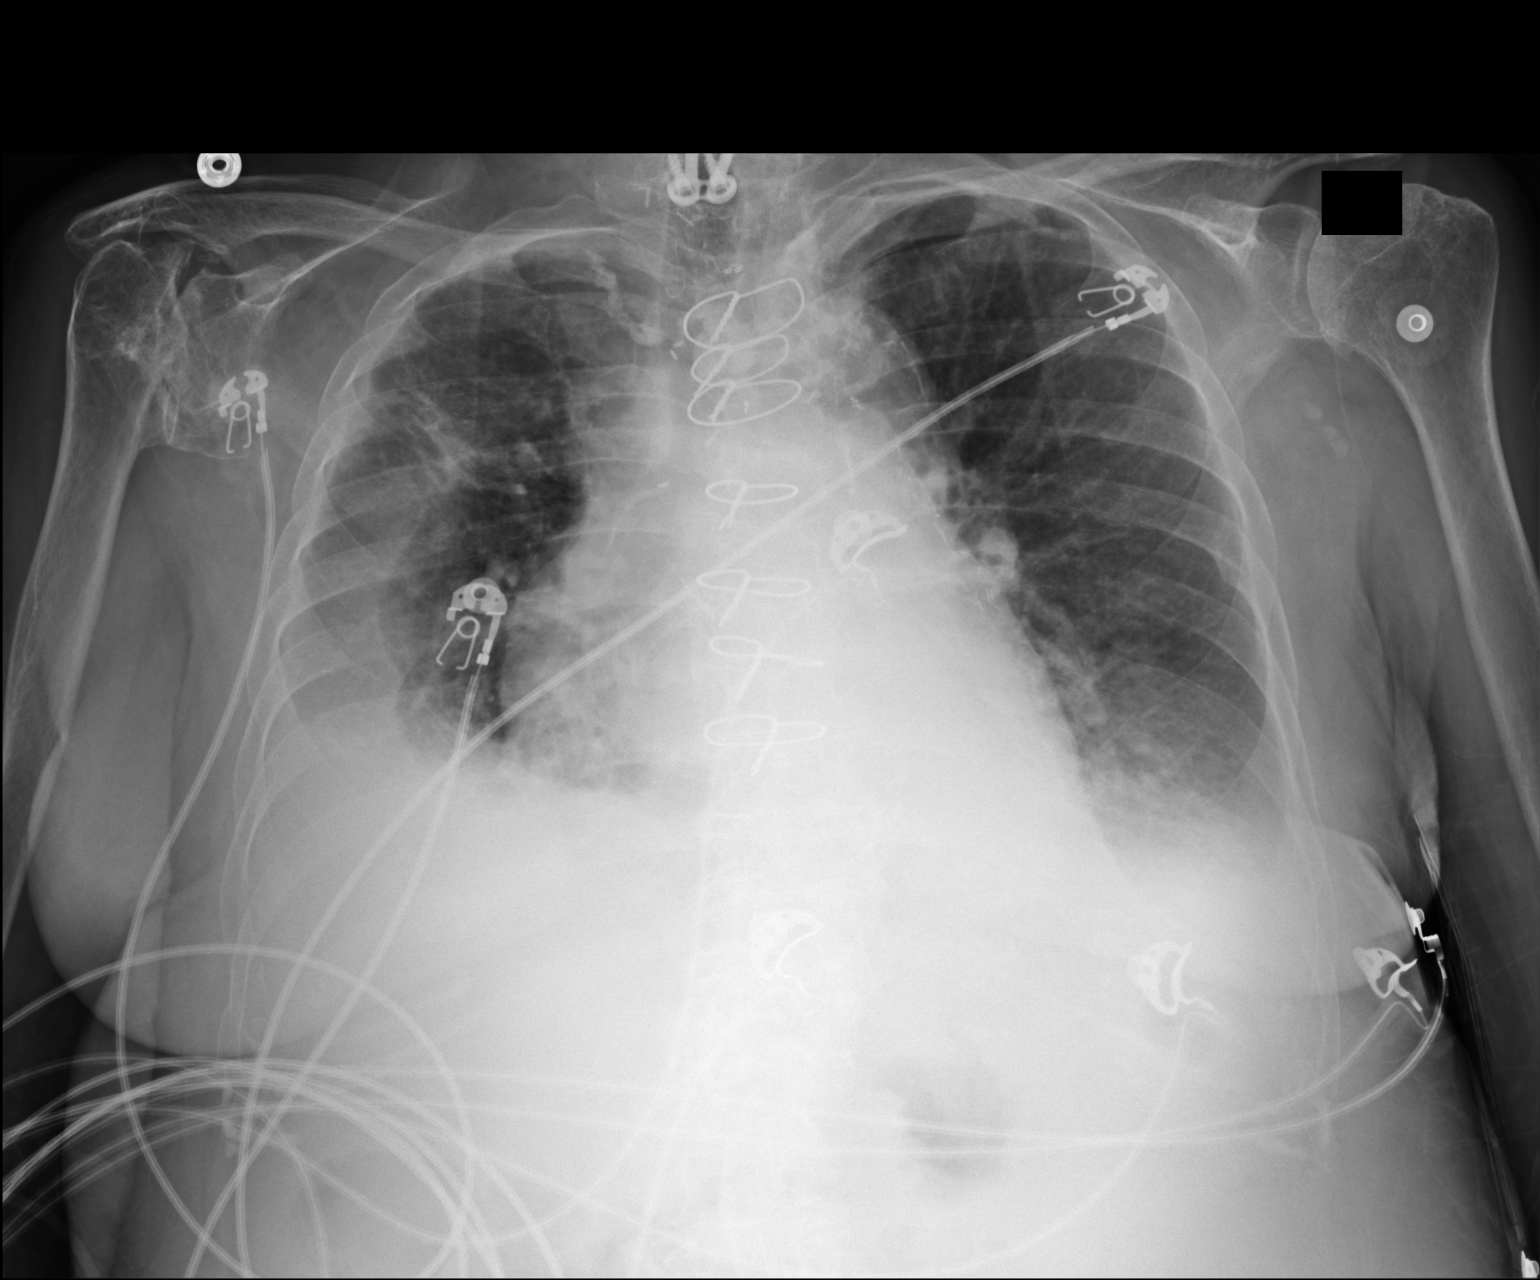

[1 of 1 positions shown; findings below may reference images not displayed]

FINDINGS: Cardiomegaly and CABG changes noted.

A new moderate right pleural effusion noted with right lower lung
atelectasis.

A small left pleural effusion has enlarged with associated left
basilar atelectasis.

There is no evidence of pneumothorax.

No acute bony abnormality noted. Severe right shoulder degenerative
changes and cervical spine surgical changes noted.
IMPRESSION: New moderate right pleural effusion and right lower lung
atelectasis.

Slightly increased small left pleural effusion with left basilar
atelectasis.

Cardiomegaly and CABG changes.

## 2018-06-29 IMAGING — CR DG CHEST 1V PORT
1 series · 1 of 1 positions shown · non-contrast
Comparison: 11/19/2016

CLINICAL DATA: ET tube, respiratory failure

EXAM:
PORTABLE CHEST 1 VIEW

[AP]
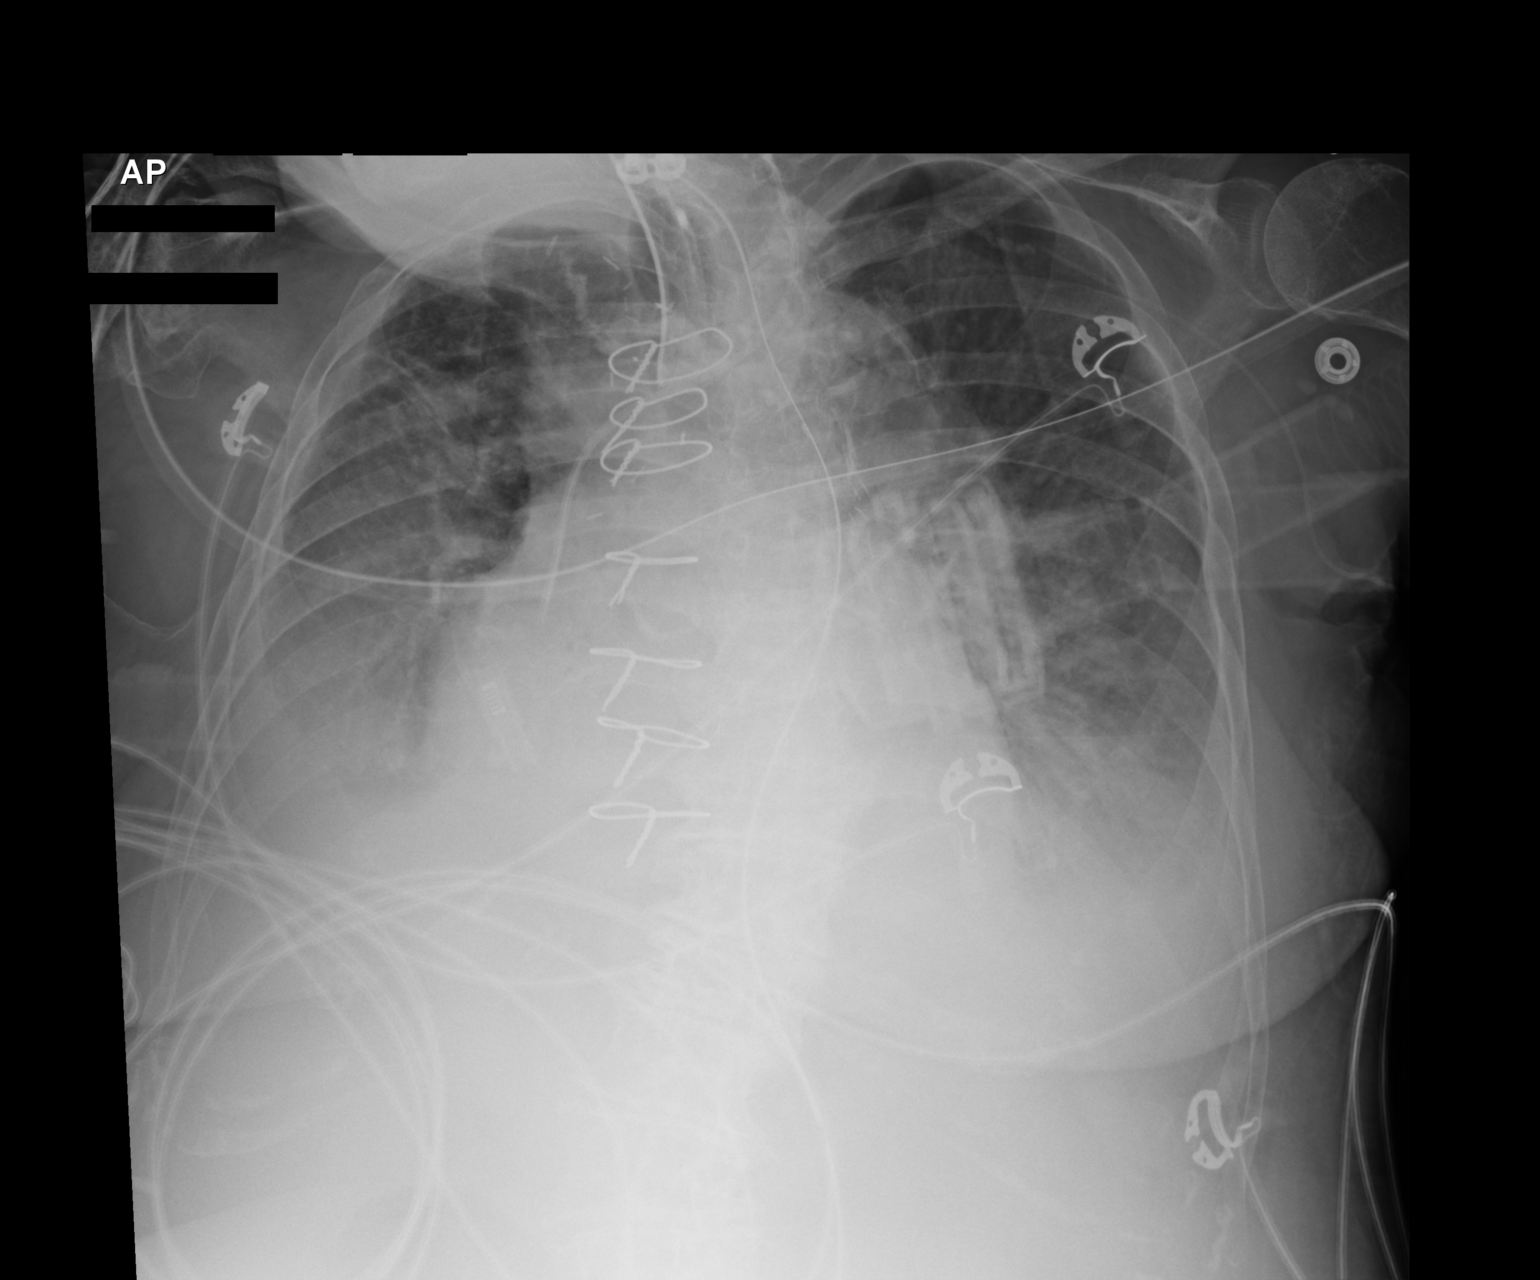

[1 of 1 positions shown; findings below may reference images not displayed]

FINDINGS: Endotracheal tube and left central line as well as NG tube remain in
place, unchanged. Moderate bilateral layering effusions. Diffuse
bilateral airspace disease, likely edema. Mild cardiomegaly.
IMPRESSION: Moderate layering bilateral pleural effusions with bilateral
airspace disease, likely CHF. No real change.
